# Patient Record
Sex: Female | Born: 1935 | Race: White | Hispanic: No | Smoking: Former smoker
Health system: Southern US, Community
[De-identification: ages and names within clinical notes are randomized; demographics above are authoritative.]

## PROBLEM LIST (undated history)

## (undated) DIAGNOSIS — J42 Unspecified chronic bronchitis: Secondary | ICD-10-CM

## (undated) DIAGNOSIS — J449 Chronic obstructive pulmonary disease, unspecified: Secondary | ICD-10-CM

## (undated) DIAGNOSIS — R112 Nausea with vomiting, unspecified: Secondary | ICD-10-CM

## (undated) DIAGNOSIS — M1990 Unspecified osteoarthritis, unspecified site: Secondary | ICD-10-CM

## (undated) DIAGNOSIS — Z9889 Other specified postprocedural states: Secondary | ICD-10-CM

## (undated) DIAGNOSIS — T4145XA Adverse effect of unspecified anesthetic, initial encounter: Secondary | ICD-10-CM

## (undated) DIAGNOSIS — K219 Gastro-esophageal reflux disease without esophagitis: Secondary | ICD-10-CM

## (undated) DIAGNOSIS — E079 Disorder of thyroid, unspecified: Secondary | ICD-10-CM

## (undated) DIAGNOSIS — E039 Hypothyroidism, unspecified: Secondary | ICD-10-CM

## (undated) DIAGNOSIS — T8859XA Other complications of anesthesia, initial encounter: Secondary | ICD-10-CM

## (undated) DIAGNOSIS — N183 Chronic kidney disease, stage 3 (moderate): Secondary | ICD-10-CM

## (undated) DIAGNOSIS — C20 Malignant neoplasm of rectum: Secondary | ICD-10-CM

## (undated) DIAGNOSIS — H269 Unspecified cataract: Secondary | ICD-10-CM

## (undated) DIAGNOSIS — R011 Cardiac murmur, unspecified: Secondary | ICD-10-CM

## (undated) DIAGNOSIS — E785 Hyperlipidemia, unspecified: Secondary | ICD-10-CM

## (undated) DIAGNOSIS — I10 Essential (primary) hypertension: Secondary | ICD-10-CM

## (undated) HISTORY — PX: ABDOMINAL HYSTERECTOMY: SHX81

## (undated) HISTORY — DX: Malignant neoplasm of rectum: C20

## (undated) HISTORY — DX: Essential (primary) hypertension: I10

## (undated) HISTORY — DX: Unspecified osteoarthritis, unspecified site: M19.90

## (undated) HISTORY — DX: Gastro-esophageal reflux disease without esophagitis: K21.9

## (undated) HISTORY — DX: Unspecified cataract: H26.9

## (undated) HISTORY — DX: Hyperlipidemia, unspecified: E78.5

## (undated) HISTORY — DX: Chronic kidney disease, stage 3 (moderate): N18.3

## (undated) HISTORY — PX: OTHER SURGICAL HISTORY: SHX169

## (undated) HISTORY — DX: Cardiac murmur, unspecified: R01.1

## (undated) HISTORY — DX: Disorder of thyroid, unspecified: E07.9

## (undated) HISTORY — DX: Unspecified chronic bronchitis: J42

---

## 2000-06-25 ENCOUNTER — Emergency Department (HOSPITAL_COMMUNITY): Admission: EM | Admit: 2000-06-25 | Discharge: 2000-06-25 | Payer: Self-pay | Admitting: Emergency Medicine

## 2000-06-25 ENCOUNTER — Encounter: Payer: Self-pay | Admitting: Emergency Medicine

## 2009-02-17 ENCOUNTER — Ambulatory Visit (HOSPITAL_COMMUNITY): Admission: RE | Admit: 2009-02-17 | Discharge: 2009-02-17 | Payer: Self-pay | Admitting: Orthopedic Surgery

## 2011-11-01 DIAGNOSIS — J441 Chronic obstructive pulmonary disease with (acute) exacerbation: Secondary | ICD-10-CM | POA: Diagnosis not present

## 2011-11-01 DIAGNOSIS — Z9981 Dependence on supplemental oxygen: Secondary | ICD-10-CM | POA: Diagnosis not present

## 2011-11-01 DIAGNOSIS — J111 Influenza due to unidentified influenza virus with other respiratory manifestations: Secondary | ICD-10-CM | POA: Diagnosis not present

## 2011-11-01 DIAGNOSIS — I10 Essential (primary) hypertension: Secondary | ICD-10-CM | POA: Diagnosis not present

## 2011-11-01 DIAGNOSIS — F17200 Nicotine dependence, unspecified, uncomplicated: Secondary | ICD-10-CM | POA: Diagnosis not present

## 2011-11-06 DIAGNOSIS — Z9981 Dependence on supplemental oxygen: Secondary | ICD-10-CM | POA: Diagnosis not present

## 2011-11-06 DIAGNOSIS — I10 Essential (primary) hypertension: Secondary | ICD-10-CM | POA: Diagnosis not present

## 2011-11-06 DIAGNOSIS — J441 Chronic obstructive pulmonary disease with (acute) exacerbation: Secondary | ICD-10-CM | POA: Diagnosis not present

## 2011-11-06 DIAGNOSIS — J111 Influenza due to unidentified influenza virus with other respiratory manifestations: Secondary | ICD-10-CM | POA: Diagnosis not present

## 2011-11-06 DIAGNOSIS — F17200 Nicotine dependence, unspecified, uncomplicated: Secondary | ICD-10-CM | POA: Diagnosis not present

## 2011-11-08 DIAGNOSIS — F17200 Nicotine dependence, unspecified, uncomplicated: Secondary | ICD-10-CM | POA: Diagnosis not present

## 2011-11-08 DIAGNOSIS — J111 Influenza due to unidentified influenza virus with other respiratory manifestations: Secondary | ICD-10-CM | POA: Diagnosis not present

## 2011-11-08 DIAGNOSIS — I10 Essential (primary) hypertension: Secondary | ICD-10-CM | POA: Diagnosis not present

## 2011-11-08 DIAGNOSIS — Z9981 Dependence on supplemental oxygen: Secondary | ICD-10-CM | POA: Diagnosis not present

## 2011-11-08 DIAGNOSIS — J441 Chronic obstructive pulmonary disease with (acute) exacerbation: Secondary | ICD-10-CM | POA: Diagnosis not present

## 2011-11-14 DIAGNOSIS — J441 Chronic obstructive pulmonary disease with (acute) exacerbation: Secondary | ICD-10-CM | POA: Diagnosis not present

## 2011-11-14 DIAGNOSIS — F17200 Nicotine dependence, unspecified, uncomplicated: Secondary | ICD-10-CM | POA: Diagnosis not present

## 2011-11-14 DIAGNOSIS — J111 Influenza due to unidentified influenza virus with other respiratory manifestations: Secondary | ICD-10-CM | POA: Diagnosis not present

## 2011-11-14 DIAGNOSIS — Z9981 Dependence on supplemental oxygen: Secondary | ICD-10-CM | POA: Diagnosis not present

## 2011-11-14 DIAGNOSIS — I10 Essential (primary) hypertension: Secondary | ICD-10-CM | POA: Diagnosis not present

## 2011-11-18 DIAGNOSIS — F17200 Nicotine dependence, unspecified, uncomplicated: Secondary | ICD-10-CM | POA: Diagnosis not present

## 2011-11-18 DIAGNOSIS — I10 Essential (primary) hypertension: Secondary | ICD-10-CM | POA: Diagnosis not present

## 2011-11-18 DIAGNOSIS — Z9981 Dependence on supplemental oxygen: Secondary | ICD-10-CM | POA: Diagnosis not present

## 2011-11-18 DIAGNOSIS — J441 Chronic obstructive pulmonary disease with (acute) exacerbation: Secondary | ICD-10-CM | POA: Diagnosis not present

## 2011-11-18 DIAGNOSIS — J111 Influenza due to unidentified influenza virus with other respiratory manifestations: Secondary | ICD-10-CM | POA: Diagnosis not present

## 2011-11-21 DIAGNOSIS — J4489 Other specified chronic obstructive pulmonary disease: Secondary | ICD-10-CM | POA: Diagnosis not present

## 2011-11-21 DIAGNOSIS — E0590 Thyrotoxicosis, unspecified without thyrotoxic crisis or storm: Secondary | ICD-10-CM | POA: Diagnosis not present

## 2011-11-21 DIAGNOSIS — E039 Hypothyroidism, unspecified: Secondary | ICD-10-CM | POA: Diagnosis not present

## 2011-11-21 DIAGNOSIS — J449 Chronic obstructive pulmonary disease, unspecified: Secondary | ICD-10-CM | POA: Diagnosis not present

## 2011-11-21 DIAGNOSIS — I10 Essential (primary) hypertension: Secondary | ICD-10-CM | POA: Diagnosis not present

## 2011-11-28 DIAGNOSIS — R51 Headache: Secondary | ICD-10-CM | POA: Diagnosis not present

## 2011-11-28 DIAGNOSIS — I10 Essential (primary) hypertension: Secondary | ICD-10-CM | POA: Diagnosis not present

## 2011-12-04 DIAGNOSIS — J441 Chronic obstructive pulmonary disease with (acute) exacerbation: Secondary | ICD-10-CM | POA: Diagnosis not present

## 2011-12-04 DIAGNOSIS — I10 Essential (primary) hypertension: Secondary | ICD-10-CM | POA: Diagnosis not present

## 2011-12-04 DIAGNOSIS — Z9981 Dependence on supplemental oxygen: Secondary | ICD-10-CM | POA: Diagnosis not present

## 2011-12-04 DIAGNOSIS — J111 Influenza due to unidentified influenza virus with other respiratory manifestations: Secondary | ICD-10-CM | POA: Diagnosis not present

## 2011-12-04 DIAGNOSIS — F17200 Nicotine dependence, unspecified, uncomplicated: Secondary | ICD-10-CM | POA: Diagnosis not present

## 2011-12-25 DIAGNOSIS — I10 Essential (primary) hypertension: Secondary | ICD-10-CM | POA: Diagnosis not present

## 2011-12-25 DIAGNOSIS — J111 Influenza due to unidentified influenza virus with other respiratory manifestations: Secondary | ICD-10-CM | POA: Diagnosis not present

## 2011-12-25 DIAGNOSIS — Z9981 Dependence on supplemental oxygen: Secondary | ICD-10-CM | POA: Diagnosis not present

## 2011-12-25 DIAGNOSIS — F17200 Nicotine dependence, unspecified, uncomplicated: Secondary | ICD-10-CM | POA: Diagnosis not present

## 2011-12-25 DIAGNOSIS — J441 Chronic obstructive pulmonary disease with (acute) exacerbation: Secondary | ICD-10-CM | POA: Diagnosis not present

## 2012-02-13 DIAGNOSIS — J449 Chronic obstructive pulmonary disease, unspecified: Secondary | ICD-10-CM | POA: Diagnosis not present

## 2012-02-13 DIAGNOSIS — Z87891 Personal history of nicotine dependence: Secondary | ICD-10-CM | POA: Diagnosis not present

## 2012-02-13 DIAGNOSIS — R0902 Hypoxemia: Secondary | ICD-10-CM | POA: Diagnosis not present

## 2012-03-06 DIAGNOSIS — J441 Chronic obstructive pulmonary disease with (acute) exacerbation: Secondary | ICD-10-CM | POA: Diagnosis not present

## 2012-03-06 DIAGNOSIS — Z87891 Personal history of nicotine dependence: Secondary | ICD-10-CM | POA: Diagnosis not present

## 2012-03-12 DIAGNOSIS — J449 Chronic obstructive pulmonary disease, unspecified: Secondary | ICD-10-CM | POA: Diagnosis not present

## 2012-04-09 DIAGNOSIS — Z87891 Personal history of nicotine dependence: Secondary | ICD-10-CM | POA: Diagnosis not present

## 2012-04-09 DIAGNOSIS — J441 Chronic obstructive pulmonary disease with (acute) exacerbation: Secondary | ICD-10-CM | POA: Diagnosis not present

## 2012-04-28 DIAGNOSIS — H109 Unspecified conjunctivitis: Secondary | ICD-10-CM | POA: Diagnosis not present

## 2012-05-04 DIAGNOSIS — H04129 Dry eye syndrome of unspecified lacrimal gland: Secondary | ICD-10-CM | POA: Diagnosis not present

## 2012-07-07 DIAGNOSIS — J449 Chronic obstructive pulmonary disease, unspecified: Secondary | ICD-10-CM | POA: Diagnosis not present

## 2012-07-07 DIAGNOSIS — Z87891 Personal history of nicotine dependence: Secondary | ICD-10-CM | POA: Diagnosis not present

## 2012-07-07 DIAGNOSIS — R0902 Hypoxemia: Secondary | ICD-10-CM | POA: Diagnosis not present

## 2012-08-17 DIAGNOSIS — R0902 Hypoxemia: Secondary | ICD-10-CM | POA: Diagnosis not present

## 2012-08-17 DIAGNOSIS — Z87891 Personal history of nicotine dependence: Secondary | ICD-10-CM | POA: Diagnosis not present

## 2012-08-17 DIAGNOSIS — J441 Chronic obstructive pulmonary disease with (acute) exacerbation: Secondary | ICD-10-CM | POA: Diagnosis not present

## 2012-09-16 DIAGNOSIS — M2550 Pain in unspecified joint: Secondary | ICD-10-CM | POA: Diagnosis not present

## 2012-09-16 DIAGNOSIS — K219 Gastro-esophageal reflux disease without esophagitis: Secondary | ICD-10-CM | POA: Diagnosis not present

## 2012-09-16 DIAGNOSIS — M79609 Pain in unspecified limb: Secondary | ICD-10-CM | POA: Diagnosis not present

## 2012-09-16 DIAGNOSIS — Z23 Encounter for immunization: Secondary | ICD-10-CM | POA: Diagnosis not present

## 2012-09-16 DIAGNOSIS — E559 Vitamin D deficiency, unspecified: Secondary | ICD-10-CM | POA: Diagnosis not present

## 2012-09-16 DIAGNOSIS — I10 Essential (primary) hypertension: Secondary | ICD-10-CM | POA: Diagnosis not present

## 2012-09-16 DIAGNOSIS — E039 Hypothyroidism, unspecified: Secondary | ICD-10-CM | POA: Diagnosis not present

## 2012-09-29 DIAGNOSIS — Z87891 Personal history of nicotine dependence: Secondary | ICD-10-CM | POA: Diagnosis not present

## 2012-09-29 DIAGNOSIS — J441 Chronic obstructive pulmonary disease with (acute) exacerbation: Secondary | ICD-10-CM | POA: Diagnosis not present

## 2012-09-29 DIAGNOSIS — R0902 Hypoxemia: Secondary | ICD-10-CM | POA: Diagnosis not present

## 2012-10-16 DIAGNOSIS — J209 Acute bronchitis, unspecified: Secondary | ICD-10-CM | POA: Diagnosis not present

## 2012-10-27 DIAGNOSIS — J449 Chronic obstructive pulmonary disease, unspecified: Secondary | ICD-10-CM | POA: Diagnosis not present

## 2012-10-27 DIAGNOSIS — Z87891 Personal history of nicotine dependence: Secondary | ICD-10-CM | POA: Diagnosis not present

## 2012-12-18 DIAGNOSIS — I10 Essential (primary) hypertension: Secondary | ICD-10-CM | POA: Diagnosis not present

## 2012-12-18 DIAGNOSIS — E785 Hyperlipidemia, unspecified: Secondary | ICD-10-CM | POA: Diagnosis not present

## 2012-12-18 DIAGNOSIS — E039 Hypothyroidism, unspecified: Secondary | ICD-10-CM | POA: Diagnosis not present

## 2013-01-27 DIAGNOSIS — Z87891 Personal history of nicotine dependence: Secondary | ICD-10-CM | POA: Diagnosis not present

## 2013-01-27 DIAGNOSIS — J441 Chronic obstructive pulmonary disease with (acute) exacerbation: Secondary | ICD-10-CM | POA: Diagnosis not present

## 2013-01-27 DIAGNOSIS — J449 Chronic obstructive pulmonary disease, unspecified: Secondary | ICD-10-CM | POA: Diagnosis not present

## 2013-03-10 ENCOUNTER — Other Ambulatory Visit: Payer: Self-pay | Admitting: *Deleted

## 2013-03-10 MED ORDER — TIOTROPIUM BROMIDE MONOHYDRATE 18 MCG IN CAPS: 18.0000 ug | ORAL_CAPSULE | Freq: Every day | RESPIRATORY_TRACT | Status: DC

## 2013-04-27 DIAGNOSIS — J449 Chronic obstructive pulmonary disease, unspecified: Secondary | ICD-10-CM | POA: Diagnosis not present

## 2013-04-28 ENCOUNTER — Other Ambulatory Visit: Payer: Self-pay | Admitting: *Deleted

## 2013-04-28 MED ORDER — LEVOTHYROXINE SODIUM 112 MCG PO TABS: 112.0000 ug | ORAL_TABLET | Freq: Every day | ORAL | Status: DC

## 2013-05-11 ENCOUNTER — Other Ambulatory Visit: Payer: Self-pay

## 2013-05-11 MED ORDER — OMEPRAZOLE 40 MG PO CPDR: 40.0000 mg | DELAYED_RELEASE_CAPSULE | Freq: Every day | ORAL | Status: DC

## 2013-05-20 ENCOUNTER — Other Ambulatory Visit: Payer: Self-pay

## 2013-05-20 MED ORDER — LISINOPRIL-HYDROCHLOROTHIAZIDE 20-12.5 MG PO TABS: 1.0000 | ORAL_TABLET | Freq: Two times a day (BID) | ORAL | Status: DC

## 2013-06-25 ENCOUNTER — Other Ambulatory Visit: Payer: Self-pay

## 2013-06-25 MED ORDER — LISINOPRIL-HYDROCHLOROTHIAZIDE 20-12.5 MG PO TABS: 1.0000 | ORAL_TABLET | Freq: Two times a day (BID) | ORAL | Status: DC

## 2013-06-25 NOTE — Telephone Encounter (Signed)
Last seen 12/18/12  [facility] 

## 2013-07-13 ENCOUNTER — Other Ambulatory Visit: Payer: Self-pay

## 2013-07-13 NOTE — Telephone Encounter (Signed)
Last seen 12/18/12  [facility]

## 2013-07-14 MED ORDER — TIOTROPIUM BROMIDE MONOHYDRATE 18 MCG IN CAPS: 18.0000 ug | ORAL_CAPSULE | Freq: Every day | RESPIRATORY_TRACT | Status: DC

## 2013-07-14 MED ORDER — OMEPRAZOLE 40 MG PO CPDR: 40.0000 mg | DELAYED_RELEASE_CAPSULE | Freq: Every day | ORAL | Status: DC

## 2013-07-14 MED ORDER — FLUTICASONE PROPIONATE 50 MCG/ACT NA SUSP: 2.0000 | Freq: Every day | NASAL | Status: DC

## 2013-07-20 ENCOUNTER — Other Ambulatory Visit: Payer: Self-pay

## 2013-07-20 MED ORDER — ATORVASTATIN CALCIUM 20 MG PO TABS: 20.0000 mg | ORAL_TABLET | Freq: Every day | ORAL | Status: DC

## 2013-07-20 NOTE — Telephone Encounter (Signed)
Last lipids 12/18/12  Last seen 12/18/12  [facility]

## 2013-07-27 ENCOUNTER — Other Ambulatory Visit: Payer: Self-pay

## 2013-07-27 NOTE — Telephone Encounter (Signed)
Last seen 12/18/12  [facility] 

## 2013-07-28 NOTE — Telephone Encounter (Signed)
Patient needs to be seen. Has exceeded time since last visit. Needs to bring all medications to next appointment.   

## 2013-07-29 DIAGNOSIS — J449 Chronic obstructive pulmonary disease, unspecified: Secondary | ICD-10-CM | POA: Diagnosis not present

## 2013-08-17 DIAGNOSIS — J449 Chronic obstructive pulmonary disease, unspecified: Secondary | ICD-10-CM | POA: Diagnosis not present

## 2013-08-17 DIAGNOSIS — I10 Essential (primary) hypertension: Secondary | ICD-10-CM | POA: Diagnosis not present

## 2013-08-17 DIAGNOSIS — R011 Cardiac murmur, unspecified: Secondary | ICD-10-CM | POA: Diagnosis not present

## 2013-08-19 ENCOUNTER — Other Ambulatory Visit: Payer: Self-pay

## 2013-08-19 NOTE — Telephone Encounter (Signed)
[facility] store notified

## 2013-08-19 NOTE — Telephone Encounter (Signed)
Patient needs to be seen. Has exceeded time since last visit. Needs to bring all medications to next appointment.   

## 2013-08-19 NOTE — Telephone Encounter (Signed)
Last seen 12/18/12  [facility]

## 2013-08-19 NOTE — Telephone Encounter (Signed)
Last seen 12/18/12 [facility]  Last lipid 12/18/12

## 2013-08-27 ENCOUNTER — Ambulatory Visit (INDEPENDENT_AMBULATORY_CARE_PROVIDER_SITE_OTHER): Payer: Medicare Other | Admitting: General Practice

## 2013-08-27 ENCOUNTER — Encounter (INDEPENDENT_AMBULATORY_CARE_PROVIDER_SITE_OTHER): Payer: Self-pay

## 2013-08-27 ENCOUNTER — Encounter: Payer: Self-pay | Admitting: General Practice

## 2013-08-27 VITALS — BP 140/83 | HR 82 | Temp 98.1°F | Ht 64.0 in | Wt 135.5 lb

## 2013-08-27 DIAGNOSIS — E039 Hypothyroidism, unspecified: Secondary | ICD-10-CM | POA: Diagnosis not present

## 2013-08-27 DIAGNOSIS — I10 Essential (primary) hypertension: Secondary | ICD-10-CM

## 2013-08-27 DIAGNOSIS — E785 Hyperlipidemia, unspecified: Secondary | ICD-10-CM

## 2013-08-27 DIAGNOSIS — J449 Chronic obstructive pulmonary disease, unspecified: Secondary | ICD-10-CM

## 2013-08-27 DIAGNOSIS — K219 Gastro-esophageal reflux disease without esophagitis: Secondary | ICD-10-CM

## 2013-08-27 MED ORDER — ATORVASTATIN CALCIUM 20 MG PO TABS: 20.0000 mg | ORAL_TABLET | Freq: Every day | ORAL | Status: DC

## 2013-08-27 MED ORDER — AMLODIPINE BESYLATE 5 MG PO TABS: 5.0000 mg | ORAL_TABLET | Freq: Every day | ORAL | Status: DC

## 2013-08-27 MED ORDER — TIOTROPIUM BROMIDE MONOHYDRATE 18 MCG IN CAPS: 18.0000 ug | ORAL_CAPSULE | Freq: Every day | RESPIRATORY_TRACT | Status: DC

## 2013-08-27 MED ORDER — OMEPRAZOLE 40 MG PO CPDR: 40.0000 mg | DELAYED_RELEASE_CAPSULE | Freq: Every day | ORAL | Status: DC

## 2013-08-27 MED ORDER — LEVOTHYROXINE SODIUM 112 MCG PO TABS: 112.0000 ug | ORAL_TABLET | Freq: Every day | ORAL | Status: DC

## 2013-08-27 MED ORDER — LISINOPRIL-HYDROCHLOROTHIAZIDE 20-12.5 MG PO TABS: 1.0000 | ORAL_TABLET | Freq: Two times a day (BID) | ORAL | Status: DC

## 2013-08-27 NOTE — Progress Notes (Signed)
  Subjective:    Patient ID: Meghan Anderson, female    DOB: [birth year], 77 y.o.   MRN: [id number]  HPI Patient presents today for follow up of chronic health conditions. She has a history of hypertension, hyperlipidemia, gerd, copd, and hypothyroidism. She reports taking medications as directed. Reports daily exercise (walking) and eating healthy. Denies any problems or concerns.     Review of Systems  Constitutional: Negative for fever and chills.  Respiratory: Negative for cough, chest tightness, shortness of breath and wheezing.   Cardiovascular: Negative for chest pain and palpitations.  Gastrointestinal: Negative for abdominal pain, diarrhea, constipation and blood in stool.  Genitourinary: Negative for dysuria, hematuria and difficulty urinating.  Musculoskeletal: Negative for back pain, neck pain and neck stiffness.  Neurological: Negative for dizziness, weakness and headaches.       Objective:   Physical Exam  Constitutional: She is oriented to person, place, and time. She appears well-developed and well-nourished.  HENT:  Head: Normocephalic and atraumatic.  Right Ear: External ear normal.  Left Ear: External ear normal.  Nose: Nose normal.  Mouth/Throat: Oropharynx is clear and moist.  Eyes: EOM are normal. Pupils are equal, round, and reactive to light.  Neck: Normal range of motion. Neck supple. No thyromegaly present.  Cardiovascular: Normal rate, regular rhythm and normal heart sounds.   Pulmonary/Chest: Effort normal and breath sounds normal. No respiratory distress. She exhibits no tenderness.  Abdominal: Soft. Bowel sounds are normal. She exhibits no distension. There is no tenderness.  Musculoskeletal: She exhibits no edema and no tenderness.  Lymphadenopathy:    She has no cervical adenopathy.  Neurological: She is alert and oriented to person, place, and time.  Skin: Skin is warm and dry.  Psychiatric: She has a normal mood and affect.           Assessment & Plan:  1. Hyperlipidemia  - NMR, lipoprofile - atorvastatin (LIPITOR) 20 MG tablet; Take 1 tablet (20 mg total) by mouth daily.  Dispense: 30 tablet; Refill: 3  2. Hypothyroid  - Thyroid Panel With TSH - levothyroxine (SYNTHROID, LEVOTHROID) 112 MCG tablet; Take 1 tablet (112 mcg total) by mouth daily.  Dispense: 30 tablet; Refill: 5  3. Hypertension  - CMP14+EGFR - amLODipine (NORVASC) 5 MG tablet; Take 1 tablet (5 mg total) by mouth daily.  Dispense: 30 tablet; Refill: 3 - lisinopril-hydrochlorothiazide (ZESTORETIC) 20-12.5 MG per tablet; Take 1 tablet by mouth 2 (two) times daily.  Dispense: 60 tablet; Refill: 3  4. COPD (chronic obstructive pulmonary disease)  - tiotropium (SPIRIVA HANDIHALER) 18 MCG inhalation capsule; Place 1 capsule (18 mcg total) into inhaler and inhale daily.  Dispense: 30 capsule; Refill: 6  5. GERD (gastroesophageal reflux disease)  - omeprazole (PRILOSEC) 40 MG capsule; Take 1 capsule (40 mg total) by mouth daily.  Dispense: 30 capsule; Refill: 3 -Continue all current medications Labs pending F/u in 3 months Discussed exercise and diet  Patient verbalized understanding Coralie Keens, FNP-C

## 2013-08-30 LAB — NMR, LIPOPROFILE
Cholesterol: 139 mg/dL (ref <200)
HDL Cholesterol by NMR: 41 mg/dL (ref >=40)
HDL Particle Number: 33.4 umol/L (ref >=30.5)
LDLC SERPL CALC-MCNC: 63 mg/dL (ref <100)
Triglycerides by NMR: 175 mg/dL — ABNORMAL HIGH (ref <150)

## 2013-08-30 LAB — CMP14+EGFR
AST: 12 IU/L (ref 0–40)
Albumin/Globulin Ratio: 1.6 (ref 1.1–2.5)
Alkaline Phosphatase: 77 IU/L (ref 39–117)
BUN/Creatinine Ratio: 17 (ref 11–26)
CO2: 26 mmol/L (ref 18–29)
Creatinine, Ser: 0.99 mg/dL (ref 0.57–1.00)
Globulin, Total: 2.6 g/dL (ref 1.5–4.5)
Sodium: 141 mmol/L (ref 134–144)
Total Bilirubin: 0.3 mg/dL (ref 0.0–1.2)

## 2013-09-02 ENCOUNTER — Encounter: Payer: Self-pay | Admitting: *Deleted

## 2013-10-06 DIAGNOSIS — J441 Chronic obstructive pulmonary disease with (acute) exacerbation: Secondary | ICD-10-CM | POA: Diagnosis not present

## 2013-10-06 DIAGNOSIS — R079 Chest pain, unspecified: Secondary | ICD-10-CM | POA: Diagnosis not present

## 2013-10-06 DIAGNOSIS — J449 Chronic obstructive pulmonary disease, unspecified: Secondary | ICD-10-CM | POA: Diagnosis not present

## 2013-10-25 ENCOUNTER — Other Ambulatory Visit: Payer: Self-pay

## 2013-10-25 DIAGNOSIS — K219 Gastro-esophageal reflux disease without esophagitis: Secondary | ICD-10-CM

## 2013-10-25 MED ORDER — OMEPRAZOLE 40 MG PO CPDR: 40.0000 mg | DELAYED_RELEASE_CAPSULE | Freq: Every day | ORAL | Status: DC

## 2013-10-29 DIAGNOSIS — J449 Chronic obstructive pulmonary disease, unspecified: Secondary | ICD-10-CM | POA: Diagnosis not present

## 2013-12-24 ENCOUNTER — Other Ambulatory Visit: Payer: Self-pay | Admitting: *Deleted

## 2013-12-24 DIAGNOSIS — E785 Hyperlipidemia, unspecified: Secondary | ICD-10-CM

## 2013-12-24 DIAGNOSIS — I10 Essential (primary) hypertension: Secondary | ICD-10-CM

## 2013-12-24 MED ORDER — ATORVASTATIN CALCIUM 20 MG PO TABS: 20.0000 mg | ORAL_TABLET | Freq: Every day | ORAL | Status: DC

## 2013-12-24 MED ORDER — LISINOPRIL-HYDROCHLOROTHIAZIDE 20-12.5 MG PO TABS: 1.0000 | ORAL_TABLET | Freq: Two times a day (BID) | ORAL | Status: DC

## 2014-01-27 DIAGNOSIS — R942 Abnormal results of pulmonary function studies: Secondary | ICD-10-CM | POA: Diagnosis not present

## 2014-01-27 DIAGNOSIS — R059 Cough, unspecified: Secondary | ICD-10-CM | POA: Diagnosis not present

## 2014-01-27 DIAGNOSIS — R05 Cough: Secondary | ICD-10-CM | POA: Diagnosis not present

## 2014-01-27 DIAGNOSIS — J449 Chronic obstructive pulmonary disease, unspecified: Secondary | ICD-10-CM | POA: Diagnosis not present

## 2014-01-28 ENCOUNTER — Other Ambulatory Visit: Payer: Self-pay | Admitting: General Practice

## 2014-03-04 ENCOUNTER — Other Ambulatory Visit: Payer: Self-pay | Admitting: General Practice

## 2014-03-07 NOTE — Telephone Encounter (Signed)
Last seen 08/27/13 Meghan Anderson  Last lipid 10/14

## 2014-03-23 ENCOUNTER — Ambulatory Visit (INDEPENDENT_AMBULATORY_CARE_PROVIDER_SITE_OTHER): Payer: Medicare Other | Admitting: Family Medicine

## 2014-03-23 ENCOUNTER — Encounter: Payer: Self-pay | Admitting: Family Medicine

## 2014-03-23 VITALS — BP 178/81 | HR 78 | Temp 98.2°F | Wt 138.6 lb

## 2014-03-23 DIAGNOSIS — E039 Hypothyroidism, unspecified: Secondary | ICD-10-CM | POA: Diagnosis not present

## 2014-03-23 DIAGNOSIS — E785 Hyperlipidemia, unspecified: Secondary | ICD-10-CM

## 2014-03-23 DIAGNOSIS — J449 Chronic obstructive pulmonary disease, unspecified: Secondary | ICD-10-CM

## 2014-03-23 DIAGNOSIS — J209 Acute bronchitis, unspecified: Secondary | ICD-10-CM

## 2014-03-23 DIAGNOSIS — I10 Essential (primary) hypertension: Secondary | ICD-10-CM

## 2014-03-23 DIAGNOSIS — K219 Gastro-esophageal reflux disease without esophagitis: Secondary | ICD-10-CM

## 2014-03-23 MED ORDER — HYDROCODONE-HOMATROPINE 5-1.5 MG/5ML PO SYRP: 5.0000 mL | ORAL_SOLUTION | Freq: Three times a day (TID) | ORAL | Status: DC | PRN

## 2014-03-23 MED ORDER — OMEPRAZOLE 40 MG PO CPDR: DELAYED_RELEASE_CAPSULE | ORAL | Status: DC

## 2014-03-23 MED ORDER — TIOTROPIUM BROMIDE MONOHYDRATE 18 MCG IN CAPS: 18.0000 ug | ORAL_CAPSULE | Freq: Every day | RESPIRATORY_TRACT | Status: DC

## 2014-03-23 MED ORDER — ATORVASTATIN CALCIUM 20 MG PO TABS: 20.0000 mg | ORAL_TABLET | Freq: Every day | ORAL | Status: DC

## 2014-03-23 MED ORDER — METHYLPREDNISOLONE ACETATE 80 MG/ML IJ SUSP: 80.0000 mg | Freq: Once | INTRAMUSCULAR | Status: DC

## 2014-03-23 MED ORDER — LEVOTHYROXINE SODIUM 112 MCG PO TABS: 112.0000 ug | ORAL_TABLET | Freq: Every day | ORAL | Status: DC

## 2014-03-23 MED ORDER — LISINOPRIL-HYDROCHLOROTHIAZIDE 20-12.5 MG PO TABS: ORAL_TABLET | ORAL | Status: DC

## 2014-03-23 MED ORDER — AMOXICILLIN 875 MG PO TABS: 875.0000 mg | ORAL_TABLET | Freq: Two times a day (BID) | ORAL | Status: DC

## 2014-03-23 NOTE — Progress Notes (Signed)
   Subjective:    Patient ID: Meghan Anderson, female    DOB: December 28, 1935, 78 y.o.   MRN: [id number]  HPI This 78 y.o. female presents for evaluation of cough and bronchitis sx's.  She is having difficulty Sleeping at night due to coughing.  She has hx of hypertension, hyperlipidemia, and SAR.  She is needing refills on her meds.   Review of Systems C/o uri sx's   No chest pain, SOB, HA, dizziness, vision change, N/V, diarrhea, constipation, dysuria, urinary urgency or frequency, myalgias, arthralgias or rash.  Objective:   Physical Exam Vital signs noted  Well developed well nourished female.  HEENT - Head atraumatic Normocephalic                Eyes - PERRLA, Conjuctiva - clear Sclera- Clear EOMI                Ears - EAC's Wnl TM's Wnl Gross Hearing WNL                Nose - Nares patent                 Throat - oropharanx wnl Respiratory - Lungs CTA bilateral Cardiac - RRR S1 and S2 without murmur GI - Abdomen soft Nontender and bowel sounds active x 4 Extremities - No edema. Neuro - Grossly intact.       Assessment & Plan:  Hypothyroid - Plan: levothyroxine (SYNTHROID, LEVOTHROID) 112 MCG tablet, amoxicillin (AMOXIL) 875 MG tablet, HYDROcodone-homatropine (HYCODAN) 5-1.5 MG/5ML syrup, DISCONTINUED: methylPREDNISolone acetate (DEPO-MEDROL) injection 80 mg  COPD (chronic obstructive pulmonary disease) - Plan: tiotropium (SPIRIVA HANDIHALER) 18 MCG inhalation capsule  Acute bronchitis - Amoxicillin 875mg  po bid x 10 days, hycodan cough syrup, depomedrol 80mg  IM Push po fluids, rest, tylenol and motrin otc prn as directed for fever, arthralgias, and myalgias.  Follow up prn if sx's continue or persist.  Essential hypertension, benign - Plan: lisinopril-hydrochlorothiazide (PRINZIDE,ZESTORETIC) 20-12.5 MG per tablet  Unspecified hypothyroidism - Plan: levothyroxine (SYNTHROID, LEVOTHROID) 112 MCG tablet  GERD (gastroesophageal reflux disease) - Plan: omeprazole (PRILOSEC)  40 MG capsule  Other and unspecified hyperlipidemia - Plan: atorvastatin (LIPITOR) 20 MG tablet  Lysbeth Penner FNP

## 2014-04-12 DIAGNOSIS — J209 Acute bronchitis, unspecified: Secondary | ICD-10-CM | POA: Diagnosis not present

## 2014-04-12 DIAGNOSIS — E039 Hypothyroidism, unspecified: Secondary | ICD-10-CM | POA: Diagnosis not present

## 2014-04-12 DIAGNOSIS — J449 Chronic obstructive pulmonary disease, unspecified: Secondary | ICD-10-CM | POA: Diagnosis not present

## 2014-04-12 DIAGNOSIS — I10 Essential (primary) hypertension: Secondary | ICD-10-CM | POA: Diagnosis not present

## 2014-04-12 MED ORDER — METHYLPREDNISOLONE ACETATE 80 MG/ML IJ SUSP
60.0000 mg | Freq: Once | INTRAMUSCULAR | Status: AC
  Administered 2014-04-12: 60 mg via INTRAMUSCULAR

## 2014-04-12 NOTE — Addendum Note (Signed)
Addended by: Marin Olp on: 04/12/2014 06:00 PM   Modules accepted: Orders

## 2014-05-09 DIAGNOSIS — R059 Cough, unspecified: Secondary | ICD-10-CM | POA: Diagnosis not present

## 2014-05-09 DIAGNOSIS — J449 Chronic obstructive pulmonary disease, unspecified: Secondary | ICD-10-CM | POA: Diagnosis not present

## 2014-05-09 DIAGNOSIS — R0609 Other forms of dyspnea: Secondary | ICD-10-CM | POA: Diagnosis not present

## 2014-05-09 DIAGNOSIS — R05 Cough: Secondary | ICD-10-CM | POA: Diagnosis not present

## 2014-05-09 DIAGNOSIS — R062 Wheezing: Secondary | ICD-10-CM | POA: Diagnosis not present

## 2014-05-09 DIAGNOSIS — R0989 Other specified symptoms and signs involving the circulatory and respiratory systems: Secondary | ICD-10-CM | POA: Diagnosis not present

## 2014-05-14 DIAGNOSIS — R079 Chest pain, unspecified: Secondary | ICD-10-CM | POA: Diagnosis not present

## 2014-05-14 DIAGNOSIS — R1011 Right upper quadrant pain: Secondary | ICD-10-CM | POA: Diagnosis not present

## 2014-05-14 DIAGNOSIS — Z87891 Personal history of nicotine dependence: Secondary | ICD-10-CM | POA: Diagnosis not present

## 2014-05-14 DIAGNOSIS — R0902 Hypoxemia: Secondary | ICD-10-CM | POA: Diagnosis not present

## 2014-05-14 DIAGNOSIS — R071 Chest pain on breathing: Secondary | ICD-10-CM | POA: Diagnosis not present

## 2014-05-14 DIAGNOSIS — I10 Essential (primary) hypertension: Secondary | ICD-10-CM | POA: Diagnosis not present

## 2014-05-14 DIAGNOSIS — R0602 Shortness of breath: Secondary | ICD-10-CM | POA: Diagnosis not present

## 2014-05-14 DIAGNOSIS — J441 Chronic obstructive pulmonary disease with (acute) exacerbation: Secondary | ICD-10-CM | POA: Diagnosis not present

## 2014-05-14 DIAGNOSIS — Z888 Allergy status to other drugs, medicaments and biological substances status: Secondary | ICD-10-CM | POA: Diagnosis not present

## 2014-05-14 DIAGNOSIS — Z79899 Other long term (current) drug therapy: Secondary | ICD-10-CM | POA: Diagnosis not present

## 2014-05-14 DIAGNOSIS — R110 Nausea: Secondary | ICD-10-CM | POA: Diagnosis not present

## 2014-05-14 DIAGNOSIS — R109 Unspecified abdominal pain: Secondary | ICD-10-CM | POA: Diagnosis not present

## 2014-05-19 ENCOUNTER — Other Ambulatory Visit: Payer: Self-pay | Admitting: Family Medicine

## 2014-07-02 ENCOUNTER — Other Ambulatory Visit: Payer: Self-pay | Admitting: Family Medicine

## 2014-07-29 ENCOUNTER — Encounter: Payer: Self-pay | Admitting: Pharmacist

## 2014-07-29 ENCOUNTER — Ambulatory Visit (INDEPENDENT_AMBULATORY_CARE_PROVIDER_SITE_OTHER): Payer: Medicare Other | Admitting: Pharmacist

## 2014-07-29 VITALS — BP 142/84 | HR 70 | Ht 64.0 in | Wt 132.0 lb

## 2014-07-29 DIAGNOSIS — E039 Hypothyroidism, unspecified: Secondary | ICD-10-CM | POA: Diagnosis not present

## 2014-07-29 DIAGNOSIS — M8580 Other specified disorders of bone density and structure, unspecified site: Secondary | ICD-10-CM

## 2014-07-29 DIAGNOSIS — J449 Chronic obstructive pulmonary disease, unspecified: Secondary | ICD-10-CM

## 2014-07-29 DIAGNOSIS — E785 Hyperlipidemia, unspecified: Secondary | ICD-10-CM | POA: Insufficient documentation

## 2014-07-29 DIAGNOSIS — M1990 Unspecified osteoarthritis, unspecified site: Secondary | ICD-10-CM

## 2014-07-29 DIAGNOSIS — K2100 Gastro-esophageal reflux disease with esophagitis, without bleeding: Secondary | ICD-10-CM

## 2014-07-29 DIAGNOSIS — R011 Cardiac murmur, unspecified: Secondary | ICD-10-CM

## 2014-07-29 DIAGNOSIS — I10 Essential (primary) hypertension: Secondary | ICD-10-CM | POA: Insufficient documentation

## 2014-07-29 DIAGNOSIS — Z0000 Encounter for general adult medical examination without abnormal findings: Secondary | ICD-10-CM

## 2014-07-29 MED ORDER — FLUTICASONE PROPIONATE 50 MCG/ACT NA SUSP: 2.0000 | Freq: Every day | NASAL | Status: DC | PRN

## 2014-07-29 NOTE — Patient Instructions (Signed)
Health Maintenance Summary    TETANUS/TDAP Overdue 02/23/1957   Cost covered by insurance    Dexa / Bone Density Overdue  Sent in referral today      ZOSTAVAX Overdue 02/23/1998  cost covered by insurance    PNEUMOCOCCAL POLYSACCHARIDE VACCINE AGE 76 AND OVER Postponed 08/11/2014 Will discuss with lung specialist    INFLUENZA VACCINE Postponed 08/15/2014 Will discuss with lung specialist      Preventive Care for Adults A healthy lifestyle and preventive care can promote health and wellness. Preventive health guidelines for women include the following key practices.  A routine yearly physical is a good way to check with your health care provider about your health and preventive screening. It is a chance to share any concerns and updates on your health and to receive a thorough exam.  Visit your dentist for a routine exam and preventive care every 6 months. Brush your teeth twice a day and floss once a day. Good oral hygiene prevents tooth decay and gum disease.  The frequency of eye exams is based on your age, health, family medical history, use of contact lenses, and other factors. Follow your health care provider's recommendations for frequency of eye exams.  Eat a healthy diet. Foods like vegetables, fruits, whole grains, low-fat dairy products, and lean protein foods contain the nutrients you need without too many calories. Decrease your intake of foods high in solid fats, added sugars, and salt. Eat the right amount of calories for you.Get information about a proper diet from your health care provider, if necessary.  Regular physical exercise is one of the most important things you can do for your health. Most adults should get at least 150 minutes of moderate-intensity exercise (any activity that increases your heart rate and causes you to sweat) each week. In addition, most adults need muscle-strengthening exercises on 2 or more days a week.  Maintain a healthy weight. The body mass index  (BMI) is a screening tool to identify possible weight problems. It provides an estimate of body fat based on height and weight. Your health care provider can find your BMI and can help you achieve or maintain a healthy weight.For adults 20 years and older:  A BMI below 18.5 is considered underweight.  A BMI of 18.5 to 24.9 is normal.  A BMI of 25 to 29.9 is considered overweight.  A BMI of 30 and above is considered obese.  Maintain normal blood lipids and cholesterol levels by exercising and minimizing your intake of saturated fat. Eat a balanced diet with plenty of fruit and vegetables. Blood tests for lipids and cholesterol should begin at age 1 and be repeated every 5 years. If your lipid or cholesterol levels are high, you are over 50, or you are at high risk for heart disease, you may need your cholesterol levels checked more frequently.Ongoing high lipid and cholesterol levels should be treated with medicines if diet and exercise are not working.  If you smoke, find out from your health care provider how to quit. If you do not use tobacco, do not start.  Lung cancer screening is recommended for adults aged 35-80 years who are at high risk for developing lung cancer because of a history of smoking. A yearly low-dose CT scan of the lungs is recommended for people who have at least a 30-pack-year history of smoking and are a current smoker or have quit within the past 15 years. A pack year of smoking is smoking an average of  1 pack of cigarettes a day for 1 year (for example: 1 pack a day for 30 years or 2 packs a day for 15 years). Yearly screening should continue until the smoker has stopped smoking for at least 15 years. Yearly screening should be stopped for people who develop a health problem that would prevent them from having lung cancer treatment.  If you are pregnant, do not drink alcohol. If you are breastfeeding, be very cautious about drinking alcohol. If you are not pregnant and  choose to drink alcohol, do not have more than 1 drink per day. One drink is considered to be 12 ounces (355 mL) of beer, 5 ounces (148 mL) of wine, or 1.5 ounces (44 mL) of liquor.  Avoid use of street drugs. Do not share needles with anyone. Ask for help if you need support or instructions about stopping the use of drugs.  High blood pressure causes heart disease and increases the risk of stroke. Your blood pressure should be checked at least every 1 to 2 years. Ongoing high blood pressure should be treated with medicines if weight loss and exercise do not work.  If you are 63-8 years old, ask your health care provider if you should take aspirin to prevent strokes.  Diabetes screening involves taking a blood sample to check your fasting blood sugar level. This should be done once every 3 years, after age 29, if you are within normal weight and without risk factors for diabetes. Testing should be considered at a younger age or be carried out more frequently if you are overweight and have at least 1 risk factor for diabetes.  Breast cancer screening is essential preventive care for women. You should practice "breast self-awareness." This means understanding the normal appearance and feel of your breasts and may include breast self-examination. Any changes detected, no matter how small, should be reported to a health care provider. Women in their 34s and 30s should have a clinical breast exam (CBE) by a health care provider as part of a regular health exam every 1 to 3 years. After age 39, women should have a CBE every year. Starting at age 65, women should consider having a mammogram (breast X-ray test) every year. Women who have a family history of breast cancer should talk to their health care provider about genetic screening. Women at a high risk of breast cancer should talk to their health care providers about having an MRI and a mammogram every year.  Breast cancer gene (BRCA)-related cancer risk  assessment is recommended for women who have family members with BRCA-related cancers. BRCA-related cancers include breast, ovarian, tubal, and peritoneal cancers. Having family members with these cancers may be associated with an increased risk for harmful changes (mutations) in the breast cancer genes BRCA1 and BRCA2. Results of the assessment will determine the need for genetic counseling and BRCA1 and BRCA2 testing.  Routine pelvic exams to screen for cancer are no longer recommended for nonpregnant women who are considered low risk for cancer of the pelvic organs (ovaries, uterus, and vagina) and who do not have symptoms. Ask your health care provider if a screening pelvic exam is right for you.  If you have had past treatment for cervical cancer or a condition that could lead to cancer, you need Pap tests and screening for cancer for at least 20 years after your treatment. If Pap tests have been discontinued, your risk factors (such as having a new sexual partner) need to be reassessed to  determine if screening should be resumed. Some women have medical problems that increase the chance of getting cervical cancer. In these cases, your health care provider may recommend more frequent screening and Pap tests.  The HPV test is an additional test that may be used for cervical cancer screening. The HPV test looks for the virus that can cause the cell changes on the cervix. The cells collected during the Pap test can be tested for HPV. The HPV test could be used to screen women aged 51 years and older, and should be used in women of any age who have unclear Pap test results. After the age of 74, women should have HPV testing at the same frequency as a Pap test.  Colorectal cancer can be detected and often prevented. Most routine colorectal cancer screening begins at the age of 14 years and continues through age 49 years. However, your health care provider may recommend screening at an earlier age if you have  risk factors for colon cancer. On a yearly basis, your health care provider may provide home test kits to check for hidden blood in the stool. Use of a small camera at the end of a tube, to directly examine the colon (sigmoidoscopy or colonoscopy), can detect the earliest forms of colorectal cancer. Talk to your health care provider about this at age 56, when routine screening begins. Direct exam of the colon should be repeated every 5-10 years through age 22 years, unless early forms of pre-cancerous polyps or small growths are found.  People who are at an increased risk for hepatitis B should be screened for this virus. You are considered at high risk for hepatitis B if:  You were born in a country where hepatitis B occurs often. Talk with your health care provider about which countries are considered high risk.  Your parents were born in a high-risk country and you have not received a shot to protect against hepatitis B (hepatitis B vaccine).  You have HIV or AIDS.  You use needles to inject street drugs.  You live with, or have sex with, someone who has hepatitis B.  You get hemodialysis treatment.  You take certain medicines for conditions like cancer, organ transplantation, and autoimmune conditions.  Hepatitis C blood testing is recommended for all people born from [birth year] through 1965 and any individual with known risks for hepatitis C.  Practice safe sex. Use condoms and avoid high-risk sexual practices to reduce the spread of sexually transmitted infections (STIs). STIs include gonorrhea, chlamydia, syphilis, trichomonas, herpes, HPV, and human immunodeficiency virus (HIV). Herpes, HIV, and HPV are viral illnesses that have no cure. They can result in disability, cancer, and death.  You should be screened for sexually transmitted illnesses (STIs) including gonorrhea and chlamydia if:  You are sexually active and are younger than 24 years.  You are older than 24 years and your health  care provider tells you that you are at risk for this type of infection.  Your sexual activity has changed since you were last screened and you are at an increased risk for chlamydia or gonorrhea. Ask your health care provider if you are at risk.  If you are at risk of being infected with HIV, it is recommended that you take a prescription medicine daily to prevent HIV infection. This is called preexposure prophylaxis (PrEP). You are considered at risk if:  You are a heterosexual woman, are sexually active, and are at increased risk for HIV infection.  You take  drugs by injection.  You are sexually active with a partner who has HIV.  Talk with your health care provider about whether you are at high risk of being infected with HIV. If you choose to begin PrEP, you should first be tested for HIV. You should then be tested every 3 months for as long as you are taking PrEP.  Osteoporosis is a disease in which the bones lose minerals and strength with aging. This can result in serious bone fractures or breaks. The risk of osteoporosis can be identified using a bone density scan. Women ages 47 years and over and women at risk for fractures or osteoporosis should discuss screening with their health care providers. Ask your health care provider whether you should take a calcium supplement or vitamin D to reduce the rate of osteoporosis.  Menopause can be associated with physical symptoms and risks. Hormone replacement therapy is available to decrease symptoms and risks. You should talk to your health care provider about whether hormone replacement therapy is right for you.  Use sunscreen. Apply sunscreen liberally and repeatedly throughout the day. You should seek shade when your shadow is shorter than you. Protect yourself by wearing long sleeves, pants, a wide-brimmed hat, and sunglasses year round, whenever you are outdoors.  Once a month, do a whole body skin exam, using a mirror to look at the skin  on your back. Tell your health care provider of new moles, moles that have irregular borders, moles that are larger than a pencil eraser, or moles that have changed in shape or color.  Stay current with required vaccines (immunizations).  Influenza vaccine. All adults should be immunized every year.  Tetanus, diphtheria, and acellular pertussis (Td, Tdap) vaccine. Pregnant women should receive 1 dose of Tdap vaccine during each pregnancy. The dose should be obtained regardless of the length of time since the last dose. Immunization is preferred during the 27th-36th week of gestation. An adult who has not previously received Tdap or who does not know her vaccine status should receive 1 dose of Tdap. This initial dose should be followed by tetanus and diphtheria toxoids (Td) booster doses every 10 years. Adults with an unknown or incomplete history of completing a 3-dose immunization series with Td-containing vaccines should begin or complete a primary immunization series including a Tdap dose. Adults should receive a Td booster every 10 years.  Varicella vaccine. An adult without evidence of immunity to varicella should receive 2 doses or a second dose if she has previously received 1 dose. Pregnant females who do not have evidence of immunity should receive the first dose after pregnancy. This first dose should be obtained before leaving the health care facility. The second dose should be obtained 4-8 weeks after the first dose.  Human papillomavirus (HPV) vaccine. Females aged 13-26 years who have not received the vaccine previously should obtain the 3-dose series. The vaccine is not recommended for use in pregnant females. However, pregnancy testing is not needed before receiving a dose. If a female is found to be pregnant after receiving a dose, no treatment is needed. In that case, the remaining doses should be delayed until after the pregnancy. Immunization is recommended for any person with an  immunocompromised condition through the age of 68 years if she did not get any or all doses earlier. During the 3-dose series, the second dose should be obtained 4-8 weeks after the first dose. The third dose should be obtained 24 weeks after the first dose and  16 weeks after the second dose.  Zoster vaccine. One dose is recommended for adults aged 62 years or older unless certain conditions are present.  Measles, mumps, and rubella (MMR) vaccine. Adults born before [birth year] generally are considered immune to measles and mumps. Adults born in [birth year] or later should have 1 or more doses of MMR vaccine unless there is a contraindication to the vaccine or there is laboratory evidence of immunity to each of the three diseases. A routine second dose of MMR vaccine should be obtained at least 28 days after the first dose for students attending postsecondary schools, health care workers, or international travelers. People who received inactivated measles vaccine or an unknown type of measles vaccine during 1963-1967 should receive 2 doses of MMR vaccine. People who received inactivated mumps vaccine or an unknown type of mumps vaccine before 1979 and are at high risk for mumps infection should consider immunization with 2 doses of MMR vaccine. For females of childbearing age, rubella immunity should be determined. If there is no evidence of immunity, females who are not pregnant should be vaccinated. If there is no evidence of immunity, females who are pregnant should delay immunization until after pregnancy. Unvaccinated health care workers born before [birth year] who lack laboratory evidence of measles, mumps, or rubella immunity or laboratory confirmation of disease should consider measles and mumps immunization with 2 doses of MMR vaccine or rubella immunization with 1 dose of MMR vaccine.  Pneumococcal 13-valent conjugate (PCV13) vaccine. When indicated, a person who is uncertain of her immunization history and has no record  of immunization should receive the PCV13 vaccine. An adult aged 6 years or older who has certain medical conditions and has not been previously immunized should receive 1 dose of PCV13 vaccine. This PCV13 should be followed with a dose of pneumococcal polysaccharide (PPSV23) vaccine. The PPSV23 vaccine dose should be obtained at least 8 weeks after the dose of PCV13 vaccine. An adult aged 86 years or older who has certain medical conditions and previously received 1 or more doses of PPSV23 vaccine should receive 1 dose of PCV13. The PCV13 vaccine dose should be obtained 1 or more years after the last PPSV23 vaccine dose.  Pneumococcal polysaccharide (PPSV23) vaccine. When PCV13 is also indicated, PCV13 should be obtained first. All adults aged 15 years and older should be immunized. An adult younger than age 27 years who has certain medical conditions should be immunized. Any person who resides in a nursing home or long-term care facility should be immunized. An adult smoker should be immunized. People with an immunocompromised condition and certain other conditions should receive both PCV13 and PPSV23 vaccines. People with human immunodeficiency virus (HIV) infection should be immunized as soon as possible after diagnosis. Immunization during chemotherapy or radiation therapy should be avoided. Routine use of PPSV23 vaccine is not recommended for American Indians, [place] Natives, or people younger than 65 years unless there are medical conditions that require PPSV23 vaccine. When indicated, people who have unknown immunization and have no record of immunization should receive PPSV23 vaccine. One-time revaccination 5 years after the first dose of PPSV23 is recommended for people aged 19-64 years who have chronic kidney failure, nephrotic syndrome, asplenia, or immunocompromised conditions. People who received 1-2 doses of PPSV23 before age 66 years should receive another dose of PPSV23 vaccine at age 53 years or  later if at least 5 years have passed since the previous dose. Doses of PPSV23 are not needed for people immunized with PPSV23 at  or after age 19 years.  Meningococcal vaccine. Adults with asplenia or persistent complement component deficiencies should receive 2 doses of quadrivalent meningococcal conjugate (MenACWY-D) vaccine. The doses should be obtained at least 2 months apart. Microbiologists working with certain meningococcal bacteria, [employer] recruits, people at risk during an outbreak, and people who travel to or live in countries with a high rate of meningitis should be immunized. A first-year college student up through age 49 years who is living in a residence hall should receive a dose if she did not receive a dose on or after her 16th birthday. Adults who have certain high-risk conditions should receive one or more doses of vaccine.  Hepatitis A vaccine. Adults who wish to be protected from this disease, have certain high-risk conditions, work with hepatitis A-infected animals, work in hepatitis A research labs, or travel to or work in countries with a high rate of hepatitis A should be immunized. Adults who were previously unvaccinated and who anticipate close contact with an international adoptee during the first 60 days after arrival in the [place] from a country with a high rate of hepatitis A should be immunized.  Hepatitis B vaccine. Adults who wish to be protected from this disease, have certain high-risk conditions, may be exposed to blood or other infectious body fluids, are household contacts or sex partners of hepatitis B positive people, are clients or workers in certain care facilities, or travel to or work in countries with a high rate of hepatitis B should be immunized.  Haemophilus influenzae type b (Hib) vaccine. A previously unvaccinated person with asplenia or sickle cell disease or having a scheduled splenectomy should receive 1 dose of Hib vaccine. Regardless of  previous immunization, a recipient of a hematopoietic stem cell transplant should receive a 3-dose series 6-12 months after her successful transplant. Hib vaccine is not recommended for adults with HIV infection. Preventive Services / Frequency AAges 65 years and over  Blood pressure check.** / Every 1 to 2 years.  Lipid and cholesterol check.** / Every 5 years beginning at age 7 years.  Lung cancer screening. / Every year if you are aged 29-80 years and have a 30-pack-year history of smoking and currently smoke or have quit within the past 15 years. Yearly screening is stopped once you have quit smoking for at least 15 years or develop a health problem that would prevent you from having lung cancer treatment.  Clinical breast exam.** / Every year after age 36 years.  BRCA-related cancer risk assessment.** / For women who have family members with a BRCA-related cancer (breast, ovarian, tubal, or peritoneal cancers).  Mammogram.** / Every year beginning at age 83 years and continuing for as long as you are in good health. Consult with your health care provider.  Pap test.** / Every 3 years starting at age 105 years through age 56 or 62 years with 3 consecutive normal Pap tests. Testing can be stopped between 65 and 70 years with 3 consecutive normal Pap tests and no abnormal Pap or HPV tests in the past 10 years.  HPV screening.** / Every 3 years from ages 61 years through ages 84 or 44 years with a history of 3 consecutive normal Pap tests. Testing can be stopped between 65 and 70 years with 3 consecutive normal Pap tests and no abnormal Pap or HPV tests in the past 10 years.  Fecal occult blood test (FOBT) of stool. / Every year beginning at age 84 years and continuing until age  75 years. You may not need to do this test if you get a colonoscopy every 10 years.  Flexible sigmoidoscopy or colonoscopy.** / Every 5 years for a flexible sigmoidoscopy or every 10 years for a colonoscopy beginning  at age 29 years and continuing until age 73 years.  Hepatitis C blood test.** / For all people born from [birth year] through 1963 and any individual with known risks for hepatitis C.  Osteoporosis screening.** / A one-time screening for women ages 59 years and over and women at risk for fractures or osteoporosis.  Skin self-exam. / Monthly.  Influenza vaccine. / Every year.  Tetanus, diphtheria, and acellular pertussis (Tdap/Td) vaccine.** / 1 dose of Td every 10 years.  Varicella vaccine.** / Consult your health care provider.  Zoster vaccine.** / 1 dose for adults aged 89 years or older.  Pneumococcal 13-valent conjugate (PCV13) vaccine.** / Consult your health care provider.  Pneumococcal polysaccharide (PPSV23) vaccine.** / 1 dose for all adults aged 50 years and older.  Meningococcal vaccine.** / Consult your health care provider.  Hepatitis A vaccine.** / Consult your health care provider.  Hepatitis B vaccine.** / Consult your health care provider.  Haemophilus influenzae type b (Hib) vaccine.** / Consult your health care provider. ** Family history and personal history of risk and conditions may change your health care provider's recommendations. Document Released: 12/24/2001 Document Revised: 03/14/2014 Document Reviewed: 03/25/2011 [facility] Patient Information 2015 [place], [place]. This information is not intended to replace advice given to you by your health care provider. Make sure you discuss any questions you have with your health care provider.

## 2014-07-29 NOTE — Progress Notes (Signed)
Patient ID: Meghan Anderson, female   DOB: Sep 11, 1936, 78 y.o.   MRN: [id number] Subjective:    Meghan Anderson is a 78 y.o. female who presents for Medicare Initial Wellness Visit.  Preventive Screening-Counseling & Management  Tobacco History  Smoking status  . Former Smoker  . Quit date: 11/11/2010  Smokeless tobacco  . Not on file     Current Problems (verified) Patient Active Problem List   Diagnosis Date Noted  . COPD (chronic obstructive pulmonary disease) 07/29/2014  . GERD (gastroesophageal reflux disease) 07/29/2014  . Essential hypertension, benign 07/29/2014  . Heart murmur 07/29/2014  . Arthritis 07/29/2014  . Osteopenia 07/29/2014  . Hypothyroidism 07/29/2014  . Hyperlipidemia 07/29/2014    Medications Prior to Visit Current Outpatient Prescriptions on File Prior to Visit  Medication Sig Dispense Refill  . albuterol (PROVENTIL) (2.5 MG/3ML) 0.083% nebulizer solution NEBULIZE 1 VIAL EVERY 4 HOURS AS NEEDED  150 mL  2  . atorvastatin (LIPITOR) 20 MG tablet TAKE 1 TABLET DAILY AT 6PM  30 tablet  2  . levothyroxine (SYNTHROID, LEVOTHROID) 112 MCG tablet Take 1 tablet (112 mcg total) by mouth daily.  90 tablet  3  . omeprazole (PRILOSEC) 40 MG capsule TAKE ONE (1) CAPSULE EACH DAY  90 capsule  3  . tiotropium (SPIRIVA HANDIHALER) 18 MCG inhalation capsule Place 1 capsule (18 mcg total) into inhaler and inhale daily.  90 capsule  3  . amLODipine (NORVASC) 5 MG tablet Take 1 tablet (5 mg total) by mouth daily.  30 tablet  3   No current facility-administered medications on file prior to visit.    Current Medications (verified) Current Outpatient Prescriptions  Medication Sig Dispense Refill  . albuterol (PROVENTIL) (2.5 MG/3ML) 0.083% nebulizer solution NEBULIZE 1 VIAL EVERY 4 HOURS AS NEEDED  150 mL  2  . atorvastatin (LIPITOR) 20 MG tablet TAKE 1 TABLET DAILY AT 6PM  30 tablet  2  . fexofenadine (ALLEGRA) 180 MG tablet Take 180 mg by mouth daily.      .  Fluticasone Furoate-Vilanterol (BREO ELLIPTA) 100-25 MCG/INH AEPB Inhale 1 puff into the lungs.      Marland Kitchen levothyroxine (SYNTHROID, LEVOTHROID) 112 MCG tablet Take 1 tablet (112 mcg total) by mouth daily.  90 tablet  3  . lisinopril-hydrochlorothiazide (PRINZIDE,ZESTORETIC) 20-12.5 MG per tablet Take 2 tablets by mouth daily.      Marland Kitchen omeprazole (PRILOSEC) 40 MG capsule TAKE ONE (1) CAPSULE EACH DAY  90 capsule  3  . tiotropium (SPIRIVA HANDIHALER) 18 MCG inhalation capsule Place 1 capsule (18 mcg total) into inhaler and inhale daily.  90 capsule  3  . amLODipine (NORVASC) 5 MG tablet Take 1 tablet (5 mg total) by mouth daily.  30 tablet  3  . fluticasone (FLONASE) 50 MCG/ACT nasal spray Place 2 sprays into the nose daily as needed.       No current facility-administered medications for this visit.     Allergies (verified) Prednisone and Quinolones   PAST HISTORY  Family History Family History  Problem Relation Age of Onset  . Kidney disease Mother     nephrectomy x 1  . COPD Brother   . Emphysema Brother   . Asthma Brother   . Cancer Brother 34    brain    Social History History  Substance Use Topics  . Smoking status: Former Smoker    Quit date: 11/11/2010  . Smokeless tobacco: Not on file  . Alcohol Use: No  Are there smokers in your home (other than you)? No  Risk Factors Current exercise habits: The patient does not participate in regular exercise at present. Continues to garden at home.  Dietary issues discussed: none   Cardiac risk factors: advanced age (older than 49 for men, 36 for women), dyslipidemia, hypertension and sedentary lifestyle.  Depression Screen (Note: if answer to either of the following is "Yes", a more complete depression screening is indicated)   Over the past 2 weeks, have you felt down, depressed or hopeless? No  Over the past 2 weeks, have you felt little interest or pleasure in doing things? No  Have you lost interest or pleasure in daily  life? No  Do you often feel hopeless? No  Do you cry easily over simple problems? No  Activities of Daily Living In your present state of health, do you have any difficulty performing the following activities?:  Driving? No Managing money?  No Feeding yourself? No Getting from bed to chair? No  Climbing a flight of stairs? Yes - has 12 stairs at home - gets SOB related to COPD at times - in care of pulmonologist and is due to follow up in the next month. Preparing food and eating?: No Bathing or showering? No Getting dressed: No Getting to the toilet? No Using the toilet:No Moving around from place to place: No In the past year have you fallen or had a near fall?:No   Are you sexually active?  No  Do you have more than one partner?  No  Hearing Difficulties: Yes Do you often ask people to speak up or repeat themselves? Yes Do you experience ringing or noises in your ears? Yes Do you have difficulty understanding soft or whispered voices? Yes   Do you feel that you have a problem with memory? No  Do you often misplace items? No  Do you feel safe at home?  Yes  Cognitive Testing  Alert? Yes  Normal Appearance?Yes  Oriented to person? Yes  Place? Yes   Time? Yes  Recall of three objects?  Yes  Can perform simple calculations? Yes  Displays appropriate judgment?Yes  Can read the correct time from a watch face?Yes   Advanced Directives have been discussed with the patient? Yes  List the Names of Other Physician/Practitioners you currently use: 1.  Javaid - pulmonary 2.  Renaldo- cardio 3.  Audiologist but patient does not remember name  Indicate any recent Medical Services you may have received from other than [facility] providers in the past year (date may be approximate).   There is no immunization history on file for this patient.  Screening Tests Health Maintenance  Topic Date Due  . Tetanus/tdap  02/23/1957  . Colonoscopy  02/24/1988  . Zostavax  02/23/1998  .  Pneumococcal Polysaccharide Vaccine Age 82 And Over  08/11/2014 (Originally 02/24/2003)  . Influenza Vaccine  08/15/2014 (Originally 06/11/2014)    All answers were reviewed with the patient and necessary referrals were made:  Cherre Robins, [facility]   07/29/2014   History reviewed: allergies, current medications, past family history, past medical history, past social history, past surgical history and problem list  Objective:    Body mass index is 22.65 kg/(m^2). BP 142/84  Pulse 70  Ht 5\' 4"  (1.626 m)  Wt 132 lb (59.875 kg)  BMI 22.65 kg/m2   Assessment:     Initial Medicare Wellness Visit Hypothyroidism HTN - not at goal today but has not taken meds today since fasting  Plan:     During the course of the visit the patient was educated and counseled about appropriate screening and preventive services including:    Pneumococcal vaccine - pt planning to get at pulmonologist's appt 08/10/14  Influenza vaccine -pt planning to get at pulmonologist's appt 08/10/14  Td vaccine - patient declined  Shingles / Zostavax Vaccine - patient declined  Screening electrocardiogram - UTD  Screening mammography - patient declined  Screening Pap smear and pelvic exam - no longer required  Bone densitometry screening - referral made today  Colorectal cancer screening - FOBT given today  Glaucoma screening - reminded eye exam needed  Advanced directives: Caring connections packet given  Diet review for nutrition referral? Yes __declined by patinet__  Not Indicated ____   Patient Instructions (the written plan) was given to the patient.  Medicare Attestation I have personally reviewed: The patient's medical and social history Their use of alcohol, tobacco or illicit drugs Their current medications and supplements The patient's functional ability including ADLs,fall risks, home safety risks, cognitive, and hearing and visual impairment Diet and physical activities Evidence for  depression or mood disorders  The patient's weight, height, BMI, and HR/BP have been recorded in the chart.  I have made referrals, counseling, and provided education to the patient based on review of the above and I have provided the patient with a written personalized care plan for preventive services.     Cherre Robins, [facility]   07/29/2014

## 2014-07-30 LAB — CMP14+EGFR
A/G RATIO: 1.9 (ref 1.1–2.5)
ALT: 14 IU/L (ref 0–32)
AST: 17 IU/L (ref 0–40)
Albumin: 4.4 g/dL (ref 3.5–4.8)
Alkaline Phosphatase: 62 IU/L (ref 39–117)
BUN/Creatinine Ratio: 17 (ref 11–26)
BUN: 17 mg/dL (ref 8–27)
CALCIUM: 9.2 mg/dL (ref 8.7–10.3)
CO2: 26 mmol/L (ref 18–29)
Chloride: 100 mmol/L (ref 97–108)
Creatinine, Ser: 0.98 mg/dL (ref 0.57–1.00)
GFR calc Af Amer: 65 mL/min/{1.73_m2} (ref >59)
GFR calc non Af Amer: 56 mL/min/{1.73_m2} — ABNORMAL LOW (ref >59)
GLUCOSE: 102 mg/dL — AB (ref 65–99)
Globulin, Total: 2.3 g/dL (ref 1.5–4.5)
POTASSIUM: 3.8 mmol/L (ref 3.5–5.2)
Sodium: 143 mmol/L (ref 134–144)
TOTAL PROTEIN: 6.7 g/dL (ref 6.0–8.5)
Total Bilirubin: 0.3 mg/dL (ref 0.0–1.2)

## 2014-07-30 LAB — LIPID PANEL
CHOL/HDL RATIO: 3.5 ratio (ref 0.0–4.4)
Cholesterol, Total: 149 mg/dL (ref 100–199)
HDL: 43 mg/dL (ref >39)
LDL CALC: 61 mg/dL (ref 0–99)
Triglycerides: 223 mg/dL — ABNORMAL HIGH (ref 0–149)
VLDL Cholesterol Cal: 45 mg/dL — ABNORMAL HIGH (ref 5–40)

## 2014-07-30 LAB — THYROID PANEL WITH TSH
Free Thyroxine Index: 3.3 (ref 1.2–4.9)
T3 Uptake Ratio: 31 % (ref 24–39)
T4 TOTAL: 10.5 ug/dL (ref 4.5–12.0)
TSH: 1.25 u[IU]/mL (ref 0.450–4.500)

## 2014-08-02 ENCOUNTER — Other Ambulatory Visit: Payer: Self-pay | Admitting: Pharmacist

## 2014-08-02 DIAGNOSIS — E039 Hypothyroidism, unspecified: Secondary | ICD-10-CM

## 2014-08-02 DIAGNOSIS — Z13820 Encounter for screening for osteoporosis: Secondary | ICD-10-CM

## 2014-08-03 ENCOUNTER — Telehealth: Payer: Self-pay | Admitting: Pharmacist

## 2014-08-03 ENCOUNTER — Other Ambulatory Visit: Payer: Medicare Other

## 2014-08-03 ENCOUNTER — Ambulatory Visit: Payer: Medicare Other

## 2014-08-03 NOTE — Telephone Encounter (Signed)
Patient notified of lab results from last week.  Discussed elevated Tg and FBG - to limit high CHO foods - sweets, breads, juices and sweet beverages, rice potatoes.  Increase fresh non starchy vegetables and fruits.  Recheck in 3 months.  Also left FOBT at front desk for patient to pick up.

## 2014-08-05 ENCOUNTER — Other Ambulatory Visit: Payer: Medicare Other

## 2014-08-05 DIAGNOSIS — Z1212 Encounter for screening for malignant neoplasm of rectum: Secondary | ICD-10-CM | POA: Diagnosis not present

## 2014-08-05 NOTE — Progress Notes (Signed)
Lab only 

## 2014-08-07 LAB — FECAL OCCULT BLOOD, IMMUNOCHEMICAL: FECAL OCCULT BLD: NEGATIVE

## 2014-08-09 ENCOUNTER — Encounter: Payer: Self-pay | Admitting: *Deleted

## 2014-08-10 DIAGNOSIS — R05 Cough: Secondary | ICD-10-CM | POA: Diagnosis not present

## 2014-08-10 DIAGNOSIS — R0609 Other forms of dyspnea: Secondary | ICD-10-CM | POA: Diagnosis not present

## 2014-08-10 DIAGNOSIS — R059 Cough, unspecified: Secondary | ICD-10-CM | POA: Diagnosis not present

## 2014-08-10 DIAGNOSIS — J449 Chronic obstructive pulmonary disease, unspecified: Secondary | ICD-10-CM | POA: Diagnosis not present

## 2014-08-10 DIAGNOSIS — R0989 Other specified symptoms and signs involving the circulatory and respiratory systems: Secondary | ICD-10-CM | POA: Diagnosis not present

## 2014-08-10 DIAGNOSIS — R062 Wheezing: Secondary | ICD-10-CM | POA: Diagnosis not present

## 2014-08-27 ENCOUNTER — Ambulatory Visit (INDEPENDENT_AMBULATORY_CARE_PROVIDER_SITE_OTHER): Payer: Medicare Other | Admitting: Family Medicine

## 2014-08-27 ENCOUNTER — Encounter: Payer: Self-pay | Admitting: Family Medicine

## 2014-08-27 VITALS — BP 139/83 | HR 97 | Temp 98.5°F | Ht 64.0 in | Wt 131.0 lb

## 2014-08-27 DIAGNOSIS — J440 Chronic obstructive pulmonary disease with acute lower respiratory infection: Secondary | ICD-10-CM | POA: Diagnosis not present

## 2014-08-27 DIAGNOSIS — R059 Cough, unspecified: Secondary | ICD-10-CM

## 2014-08-27 DIAGNOSIS — R0989 Other specified symptoms and signs involving the circulatory and respiratory systems: Secondary | ICD-10-CM | POA: Diagnosis not present

## 2014-08-27 DIAGNOSIS — R05 Cough: Secondary | ICD-10-CM

## 2014-08-27 DIAGNOSIS — I700 Atherosclerosis of aorta: Secondary | ICD-10-CM | POA: Diagnosis not present

## 2014-08-27 MED ORDER — AZITHROMYCIN 250 MG PO TABS: ORAL_TABLET | ORAL | Status: DC

## 2014-08-27 NOTE — Progress Notes (Addendum)
Subjective:    Patient ID: Meghan Anderson, female    DOB: [birth year], 78 y.o.   MRN: [id number]  HPI Patient here today for copd, cough, and congestion that started about 1 week ago.        Patient Active Problem List   Diagnosis Date Noted  . COPD (chronic obstructive pulmonary disease) 07/29/2014  . GERD (gastroesophageal reflux disease) 07/29/2014  . Essential hypertension, benign 07/29/2014  . Heart murmur 07/29/2014  . Arthritis 07/29/2014  . Osteopenia 07/29/2014  . Hypothyroidism 07/29/2014  . Hyperlipidemia 07/29/2014   Outpatient Encounter Prescriptions as of 08/27/2014  Medication Sig  . albuterol (PROVENTIL) (2.5 MG/3ML) 0.083% nebulizer solution NEBULIZE 1 VIAL EVERY 4 HOURS AS NEEDED  . amLODipine (NORVASC) 5 MG tablet Take 1 tablet (5 mg total) by mouth daily.  Marland Kitchen atorvastatin (LIPITOR) 20 MG tablet TAKE 1 TABLET DAILY AT 6PM  . fluticasone (FLONASE) 50 MCG/ACT nasal spray Place 2 sprays into both nostrils daily as needed.  . Fluticasone Furoate-Vilanterol (BREO ELLIPTA) 100-25 MCG/INH AEPB Inhale 1 puff into the lungs.  Marland Kitchen levothyroxine (SYNTHROID, LEVOTHROID) 112 MCG tablet Take 1 tablet (112 mcg total) by mouth daily.  Marland Kitchen lisinopril-hydrochlorothiazide (PRINZIDE,ZESTORETIC) 20-12.5 MG per tablet Take 2 tablets by mouth daily.  Marland Kitchen omeprazole (PRILOSEC) 40 MG capsule TAKE ONE (1) CAPSULE EACH DAY  . tiotropium (SPIRIVA HANDIHALER) 18 MCG inhalation capsule Place 1 capsule (18 mcg total) into inhaler and inhale daily.  . [DISCONTINUED] fexofenadine (ALLEGRA) 180 MG tablet Take 180 mg by mouth daily.    Review of Systems  Constitutional: Positive for fever (unknown).  HENT: Positive for congestion.   Eyes: Negative.   Respiratory: Positive for cough and shortness of breath.   Cardiovascular: Negative.   Gastrointestinal: Negative.   Endocrine: Negative.   Genitourinary: Negative.   Musculoskeletal: Negative.   Skin: Negative.   Allergic/Immunologic:  Negative.   Neurological: Negative.   Hematological: Negative.   Psychiatric/Behavioral: Negative.        Objective:   Physical Exam  Nursing note and vitals reviewed. Constitutional: She is oriented to person, place, and time. She appears well-developed and well-nourished. No distress.  HENT:  Head: Normocephalic and atraumatic.  Right Ear: External ear normal.  Left Ear: External ear normal.  Mouth/Throat: Oropharynx is clear and moist. No oropharyngeal exudate.  Nasal congestion bilaterally  Eyes: Conjunctivae and EOM are normal. Pupils are equal, round, and reactive to light. Right eye exhibits no discharge. Left eye exhibits no discharge. No scleral icterus.  Neck: Normal range of motion. Neck supple. No thyromegaly present.  No anterior cervical nodes  Cardiovascular: Normal rate and normal heart sounds.   No murmur heard.  The heart was slightly irregular at 84 per minute  Pulmonary/Chest: Effort normal. No respiratory distress. She has wheezes. She has rales. She exhibits no tenderness.  The breath sounds were distant and there was expiratory wheezes. The right lung base posteriorly had increased congestion and a few sparse rales.  Abdominal: Soft. Bowel sounds are normal. She exhibits no mass. There is no tenderness. There is no rebound and no guarding.  Musculoskeletal: Normal range of motion. She exhibits no edema.  Lymphadenopathy:    She has no cervical adenopathy.  Neurological: She is alert and oriented to person, place, and time.  Skin: Skin is warm and dry. No rash noted.  Psychiatric: She has a normal mood and affect. Her behavior is normal. Judgment and thought content normal.   BP 139/83  Pulse 97  Temp(Src) 98.5 F (36.9 C) (Oral)  Ht 5\' 4"  (1.626 m)  Wt 131 lb (59.421 kg)  BMI 22.47 kg/m2  The chest x-ray from 08/29/2014 revealed a small pleural effusion and atherosclerosis of the thoracic aorta      Assessment & Plan:  1. Cough - DG Chest 2 View;  Future - POCT CBC; Future - azithromycin (ZITHROMAX) 250 MG tablet; 2 pills the first day then one daily for infection until complete  Dispense: 6 tablet; Refill: 0  2. Chest congestion - DG Chest 2 View; Future - POCT CBC; Future - azithromycin (ZITHROMAX) 250 MG tablet; 2 pills the first day then one daily for infection until complete  Dispense: 6 tablet; Refill: 0  3. Bronchitis, chronic obstructive w acute bronchitis - azithromycin (ZITHROMAX) 250 MG tablet; 2 pills the first day then one daily for infection until complete  Dispense: 6 tablet; Refill: 0  Meds ordered this encounter  Medications  . azithromycin (ZITHROMAX) 250 MG tablet    Sig: 2 pills the first day then one daily for infection until complete    Dispense:  6 tablet    Refill:  0   Patient Instructions  Take Mucinex maximum strength, blue and white in color, 1 twice daily with a large glass of water for cough and congestion Use albuterol nebulizer solution 4 times daily to help with breathing and wheezing Continue to use Spiriva and Breo Return to clinic Monday for chest x-ray and CBC  Drink plenty of fluids and take Tylenol for aches pains and fever   Arrie Senate MD

## 2014-08-27 NOTE — Patient Instructions (Signed)
Take Mucinex maximum strength, blue and white in color, 1 twice daily with a large glass of water for cough and congestion Use albuterol nebulizer solution 4 times daily to help with breathing and wheezing Continue to use Spiriva and Breo Return to clinic Monday for chest x-ray and CBC  Drink plenty of fluids and take Tylenol for aches pains and fever

## 2014-08-29 ENCOUNTER — Other Ambulatory Visit: Payer: Medicare Other

## 2014-08-29 ENCOUNTER — Ambulatory Visit (INDEPENDENT_AMBULATORY_CARE_PROVIDER_SITE_OTHER): Payer: Medicare Other

## 2014-08-29 DIAGNOSIS — R0989 Other specified symptoms and signs involving the circulatory and respiratory systems: Secondary | ICD-10-CM | POA: Diagnosis not present

## 2014-08-29 DIAGNOSIS — R05 Cough: Secondary | ICD-10-CM

## 2014-08-29 DIAGNOSIS — R059 Cough, unspecified: Secondary | ICD-10-CM

## 2014-08-30 ENCOUNTER — Telehealth: Payer: Self-pay

## 2014-08-30 NOTE — Telephone Encounter (Signed)
Daughter-in law aware of CXR results and the need for repeat CXR in 3-4 weeks; Pt aware of results also

## 2014-08-30 NOTE — Telephone Encounter (Signed)
Message copied by Koren Bound on Tue Aug 30, 2014  1:50 PM ------      Message from: Chipper Herb      Created: Mon Aug 29, 2014  5:31 PM       As per radiology report-----please call patient and family member. Make sure that patient takes antibiotic until it is completed. She should have her chest x-ray repeated in 3-4 weeks or sooner if she does not get better. Please inform the patient that the x-ray revealed thoracic atherosclerosis in addition to the small pleural effusion. It also revealed osteopenia and if she has not had a DEXA scan she should get a DEXA scan done also. ------

## 2014-08-30 NOTE — Telephone Encounter (Signed)
Message copied by Koren Bound on Tue Aug 30, 2014  1:47 PM ------      Message from: Chipper Herb      Created: Mon Aug 29, 2014  5:31 PM       As per radiology report-----please call patient and family member. Make sure that patient takes antibiotic until it is completed. She should have her chest x-ray repeated in 3-4 weeks or sooner if she does not get better. Please inform the patient that the x-ray revealed thoracic atherosclerosis in addition to the small pleural effusion. It also revealed osteopenia and if she has not had a DEXA scan she should get a DEXA scan done also. ------

## 2014-08-31 ENCOUNTER — Other Ambulatory Visit: Payer: Self-pay | Admitting: *Deleted

## 2014-08-31 DIAGNOSIS — Z780 Asymptomatic menopausal state: Secondary | ICD-10-CM

## 2014-08-31 DIAGNOSIS — Z13820 Encounter for screening for osteoporosis: Secondary | ICD-10-CM

## 2014-09-07 ENCOUNTER — Other Ambulatory Visit: Payer: Medicare Other

## 2014-09-07 ENCOUNTER — Ambulatory Visit: Payer: Medicare Other

## 2014-09-08 ENCOUNTER — Other Ambulatory Visit: Payer: Self-pay | Admitting: Family Medicine

## 2014-12-29 ENCOUNTER — Ambulatory Visit (INDEPENDENT_AMBULATORY_CARE_PROVIDER_SITE_OTHER): Payer: Medicare Other | Admitting: Nurse Practitioner

## 2014-12-29 ENCOUNTER — Encounter: Payer: Self-pay | Admitting: Nurse Practitioner

## 2014-12-29 VITALS — BP 160/84 | HR 82 | Temp 97.3°F | Ht 64.0 in | Wt 139.0 lb

## 2014-12-29 DIAGNOSIS — E785 Hyperlipidemia, unspecified: Secondary | ICD-10-CM | POA: Diagnosis not present

## 2014-12-29 DIAGNOSIS — J449 Chronic obstructive pulmonary disease, unspecified: Secondary | ICD-10-CM | POA: Diagnosis not present

## 2014-12-29 DIAGNOSIS — I10 Essential (primary) hypertension: Secondary | ICD-10-CM | POA: Diagnosis not present

## 2014-12-29 DIAGNOSIS — K2100 Gastro-esophageal reflux disease with esophagitis, without bleeding: Secondary | ICD-10-CM

## 2014-12-29 DIAGNOSIS — Z13820 Encounter for screening for osteoporosis: Secondary | ICD-10-CM | POA: Diagnosis not present

## 2014-12-29 DIAGNOSIS — E039 Hypothyroidism, unspecified: Secondary | ICD-10-CM

## 2014-12-29 DIAGNOSIS — Z23 Encounter for immunization: Secondary | ICD-10-CM

## 2014-12-29 MED ORDER — AMLODIPINE BESYLATE 5 MG PO TABS: 5.0000 mg | ORAL_TABLET | Freq: Every day | ORAL | Status: DC

## 2014-12-29 NOTE — Progress Notes (Signed)
Subjective:    Patient ID: Meghan Anderson, female    DOB: 1935/12/15, 79 y.o.   MRN: [id number]  Hypertension This is a chronic problem. The current episode started more than 1 year ago. The problem is unchanged. The problem is controlled. Pertinent negatives include no blurred vision, headaches, palpitations, peripheral edema or shortness of breath. Risk factors for coronary artery disease include dyslipidemia, post-menopausal state and sedentary lifestyle. Past treatments include calcium channel blockers, ACE inhibitors and diuretics (patient had stopped norvacs because she ran out and just hasn't requested refill.). The current treatment provides moderate improvement. Compliance problems include diet and exercise.  Hypertensive end-organ damage includes a thyroid problem.  Hyperlipidemia This is a chronic problem. The current episode started more than 1 year ago. The problem is resistant. Recent lipid tests were reviewed and are variable. Exacerbating diseases include hypothyroidism. She has no history of diabetes or obesity. Factors aggravating her hyperlipidemia include thiazides. Pertinent negatives include no shortness of breath. Current antihyperlipidemic treatment includes statins. The current treatment provides moderate improvement of lipids. Compliance problems include adherence to diet and adherence to exercise.  Risk factors for coronary artery disease include dyslipidemia, hypertension and post-menopausal.  Thyroid Problem Presents for follow-up visit. Patient reports no diaphoresis, dry skin, menstrual problem or palpitations. The symptoms have been stable. Her past medical history is significant for hyperlipidemia. There is no history of diabetes.  GERD Omeprazole daily keeps symptoms under control COPD spirivia and BREO daily- no recent flare ups- has not needed albuterol lately  Review of Systems  Constitutional: Negative for diaphoresis.  HENT: Negative.   Eyes: Negative for  blurred vision.  Respiratory: Negative for shortness of breath.   Cardiovascular: Negative for palpitations.  Genitourinary: Negative for menstrual problem.  Neurological: Negative for headaches.  Psychiatric/Behavioral: Negative.   All other systems reviewed and are negative.      Objective:   Physical Exam  Constitutional: She is oriented to person, place, and time. She appears well-developed and well-nourished.  HENT:  Nose: Nose normal.  Mouth/Throat: Oropharynx is clear and moist.  Eyes: EOM are normal.  Neck: Trachea normal, normal range of motion and full passive range of motion without pain. Neck supple. No JVD present. Carotid bruit is not present. No thyromegaly present.  Cardiovascular: Normal rate, regular rhythm, normal heart sounds and intact distal pulses.  Exam reveals no gallop and no friction rub.   No murmur heard. Pulmonary/Chest: Effort normal. She has wheezes (exp wheezes throughout).  Wet cough  Abdominal: Soft. Bowel sounds are normal. She exhibits no distension and no mass. There is no tenderness.  Musculoskeletal: Normal range of motion.  Lymphadenopathy:    She has no cervical adenopathy.  Neurological: She is alert and oriented to person, place, and time. She has normal reflexes.  Skin: Skin is warm and dry.  Psychiatric: She has a normal mood and affect. Her behavior is normal. Judgment and thought content normal.    BP 160/84 mmHg  Pulse 82  Temp(Src) 97.3 F (36.3 C) (Oral)  Ht 5' 4" (1.626 m)  Wt 139 lb (63.05 kg)  BMI 23.85 kg/m2        Assessment & Plan:  1. Essential hypertension, benign Do not add slat to diet Get back on amlodipine- rx sent  To pharmacy - amLODipine (NORVASC) 5 MG tablet; Take 1 tablet (5 mg total) by mouth daily.  Dispense: 30 tablet; Refill: 5 - CMP14+EGFR  2. Chronic obstructive pulmonary disease, unspecified COPD, unspecified chronic bronchitis  type Avoid allergens  3. Gastroesophageal reflux disease with  esophagitis Avoid spicy foods Do not eat 2 hours prior to bedtime  4. Hypothyroidism, unspecified hypothyroidism type - Thyroid Panel With TSH  5. Hyperlipidemia Low fat diet - NMR, lipoprofile   Refuses most adult immunizations- did agree to prevnar 13 dexa ordered Labs pending Health maintenance reviewed Diet and exercise encouraged Continue all meds Follow up  In 3 months   [place], FNP

## 2014-12-29 NOTE — Patient Instructions (Signed)

## 2014-12-29 NOTE — Addendum Note (Signed)
Addended by: Rolena Infante on: 12/29/2014 11:56 AM   Modules accepted: Orders

## 2014-12-30 ENCOUNTER — Other Ambulatory Visit: Payer: Self-pay | Admitting: Nurse Practitioner

## 2014-12-30 LAB — CMP14+EGFR
ALBUMIN: 3.5 g/dL (ref 3.5–4.8)
ALT: 6 IU/L (ref 0–32)
AST: 13 IU/L (ref 0–40)
Albumin/Globulin Ratio: 1.5 (ref 1.1–2.5)
Alkaline Phosphatase: 67 IU/L (ref 39–117)
BUN / CREAT RATIO: 12 (ref 11–26)
BUN: 15 mg/dL (ref 8–27)
Bilirubin Total: 0.2 mg/dL (ref 0.0–1.2)
CALCIUM: 8.6 mg/dL — AB (ref 8.7–10.3)
CO2: 24 mmol/L (ref 18–29)
Chloride: 102 mmol/L (ref 97–108)
Creatinine, Ser: 1.27 mg/dL — ABNORMAL HIGH (ref 0.57–1.00)
GFR calc Af Amer: 47 mL/min/{1.73_m2} — ABNORMAL LOW (ref >59)
GFR calc non Af Amer: 41 mL/min/{1.73_m2} — ABNORMAL LOW (ref >59)
Globulin, Total: 2.3 g/dL (ref 1.5–4.5)
Glucose: 98 mg/dL (ref 65–99)
Potassium: 3.3 mmol/L — ABNORMAL LOW (ref 3.5–5.2)
Sodium: 143 mmol/L (ref 134–144)
TOTAL PROTEIN: 5.8 g/dL — AB (ref 6.0–8.5)

## 2014-12-30 LAB — NMR, LIPOPROFILE
Cholesterol: 178 mg/dL (ref 100–199)
HDL Cholesterol by NMR: 49 mg/dL (ref >39)
HDL PARTICLE NUMBER: 38.9 umol/L (ref >=30.5)
LDL Particle Number: 1087 nmol/L — ABNORMAL HIGH (ref <1000)
LDL Size: 20.7 nm (ref >20.5)
LDL-C: 83 mg/dL (ref 0–99)
LP-IR SCORE: 44 (ref <=45)
Small LDL Particle Number: 669 nmol/L — ABNORMAL HIGH (ref <=527)
Triglycerides by NMR: 230 mg/dL — ABNORMAL HIGH (ref 0–149)

## 2014-12-30 LAB — THYROID PANEL WITH TSH
FREE THYROXINE INDEX: 1.5 (ref 1.2–4.9)
T3 Uptake Ratio: 27 % (ref 24–39)
T4, Total: 5.6 ug/dL (ref 4.5–12.0)
TSH: 19.05 u[IU]/mL — ABNORMAL HIGH (ref 0.450–4.500)

## 2014-12-30 MED ORDER — LEVOTHYROXINE SODIUM 125 MCG PO TABS: 125.0000 ug | ORAL_TABLET | Freq: Every day | ORAL | Status: DC

## 2015-01-11 ENCOUNTER — Ambulatory Visit: Payer: Medicare Other

## 2015-01-11 ENCOUNTER — Other Ambulatory Visit: Payer: Medicare Other

## 2015-02-08 DIAGNOSIS — J449 Chronic obstructive pulmonary disease, unspecified: Secondary | ICD-10-CM | POA: Diagnosis not present

## 2015-02-08 DIAGNOSIS — R0600 Dyspnea, unspecified: Secondary | ICD-10-CM | POA: Diagnosis not present

## 2015-02-08 DIAGNOSIS — J309 Allergic rhinitis, unspecified: Secondary | ICD-10-CM | POA: Diagnosis not present

## 2015-04-14 ENCOUNTER — Other Ambulatory Visit: Payer: Self-pay | Admitting: Family Medicine

## 2015-05-12 DIAGNOSIS — J441 Chronic obstructive pulmonary disease with (acute) exacerbation: Secondary | ICD-10-CM | POA: Diagnosis not present

## 2015-05-12 DIAGNOSIS — E869 Volume depletion, unspecified: Secondary | ICD-10-CM | POA: Diagnosis not present

## 2015-05-12 DIAGNOSIS — M5030 Other cervical disc degeneration, unspecified cervical region: Secondary | ICD-10-CM | POA: Diagnosis not present

## 2015-05-12 DIAGNOSIS — Z79899 Other long term (current) drug therapy: Secondary | ICD-10-CM | POA: Diagnosis not present

## 2015-05-12 DIAGNOSIS — E876 Hypokalemia: Secondary | ICD-10-CM | POA: Diagnosis not present

## 2015-05-12 DIAGNOSIS — I10 Essential (primary) hypertension: Secondary | ICD-10-CM | POA: Diagnosis not present

## 2015-05-12 DIAGNOSIS — Z8249 Family history of ischemic heart disease and other diseases of the circulatory system: Secondary | ICD-10-CM | POA: Diagnosis not present

## 2015-05-12 DIAGNOSIS — M1990 Unspecified osteoarthritis, unspecified site: Secondary | ICD-10-CM | POA: Diagnosis not present

## 2015-05-12 DIAGNOSIS — J449 Chronic obstructive pulmonary disease, unspecified: Secondary | ICD-10-CM | POA: Diagnosis not present

## 2015-05-12 DIAGNOSIS — S0003XA Contusion of scalp, initial encounter: Secondary | ICD-10-CM | POA: Diagnosis not present

## 2015-05-12 DIAGNOSIS — Z888 Allergy status to other drugs, medicaments and biological substances status: Secondary | ICD-10-CM | POA: Diagnosis not present

## 2015-05-12 DIAGNOSIS — Z881 Allergy status to other antibiotic agents status: Secondary | ICD-10-CM | POA: Diagnosis not present

## 2015-05-12 DIAGNOSIS — E039 Hypothyroidism, unspecified: Secondary | ICD-10-CM | POA: Diagnosis not present

## 2015-05-12 DIAGNOSIS — R531 Weakness: Secondary | ICD-10-CM | POA: Diagnosis not present

## 2015-05-12 DIAGNOSIS — M419 Scoliosis, unspecified: Secondary | ICD-10-CM | POA: Diagnosis not present

## 2015-05-12 DIAGNOSIS — M47816 Spondylosis without myelopathy or radiculopathy, lumbar region: Secondary | ICD-10-CM | POA: Diagnosis not present

## 2015-05-12 DIAGNOSIS — N179 Acute kidney failure, unspecified: Secondary | ICD-10-CM | POA: Diagnosis not present

## 2015-05-12 DIAGNOSIS — Z87891 Personal history of nicotine dependence: Secondary | ICD-10-CM | POA: Diagnosis not present

## 2015-05-12 DIAGNOSIS — M47812 Spondylosis without myelopathy or radiculopathy, cervical region: Secondary | ICD-10-CM | POA: Diagnosis not present

## 2015-05-12 DIAGNOSIS — Z049 Encounter for examination and observation for unspecified reason: Secondary | ICD-10-CM | POA: Diagnosis not present

## 2015-05-12 DIAGNOSIS — K922 Gastrointestinal hemorrhage, unspecified: Secondary | ICD-10-CM | POA: Diagnosis not present

## 2015-05-12 DIAGNOSIS — R9431 Abnormal electrocardiogram [ECG] [EKG]: Secondary | ICD-10-CM | POA: Diagnosis not present

## 2015-05-12 DIAGNOSIS — J45909 Unspecified asthma, uncomplicated: Secondary | ICD-10-CM | POA: Diagnosis not present

## 2015-05-12 DIAGNOSIS — R55 Syncope and collapse: Secondary | ICD-10-CM | POA: Diagnosis not present

## 2015-05-13 DIAGNOSIS — J441 Chronic obstructive pulmonary disease with (acute) exacerbation: Secondary | ICD-10-CM | POA: Diagnosis not present

## 2015-05-13 DIAGNOSIS — I6523 Occlusion and stenosis of bilateral carotid arteries: Secondary | ICD-10-CM | POA: Diagnosis not present

## 2015-05-13 DIAGNOSIS — R55 Syncope and collapse: Secondary | ICD-10-CM | POA: Diagnosis not present

## 2015-05-13 DIAGNOSIS — S0003XA Contusion of scalp, initial encounter: Secondary | ICD-10-CM | POA: Diagnosis not present

## 2015-05-13 DIAGNOSIS — I313 Pericardial effusion (noninflammatory): Secondary | ICD-10-CM | POA: Diagnosis not present

## 2015-05-13 DIAGNOSIS — E876 Hypokalemia: Secondary | ICD-10-CM | POA: Diagnosis not present

## 2015-05-13 DIAGNOSIS — N179 Acute kidney failure, unspecified: Secondary | ICD-10-CM | POA: Diagnosis not present

## 2015-05-13 DIAGNOSIS — I340 Nonrheumatic mitral (valve) insufficiency: Secondary | ICD-10-CM | POA: Diagnosis not present

## 2015-05-13 DIAGNOSIS — I700 Atherosclerosis of aorta: Secondary | ICD-10-CM | POA: Diagnosis not present

## 2015-05-13 DIAGNOSIS — E869 Volume depletion, unspecified: Secondary | ICD-10-CM | POA: Diagnosis not present

## 2015-05-18 ENCOUNTER — Other Ambulatory Visit: Payer: Self-pay

## 2015-05-18 ENCOUNTER — Other Ambulatory Visit: Payer: Self-pay | Admitting: Nurse Practitioner

## 2015-05-18 MED ORDER — ALBUTEROL SULFATE (2.5 MG/3ML) 0.083% IN NEBU: INHALATION_SOLUTION | RESPIRATORY_TRACT | Status: DC

## 2015-05-18 MED ORDER — LISINOPRIL-HYDROCHLOROTHIAZIDE 20-12.5 MG PO TABS: 2.0000 | ORAL_TABLET | Freq: Every day | ORAL | Status: DC

## 2015-06-16 ENCOUNTER — Other Ambulatory Visit: Payer: Self-pay | Admitting: Nurse Practitioner

## 2015-06-27 ENCOUNTER — Telehealth: Payer: Self-pay | Admitting: *Deleted

## 2015-06-27 NOTE — Telephone Encounter (Signed)
Left message for patient to call back regarding follow up appointment on elevated BP

## 2015-06-28 ENCOUNTER — Other Ambulatory Visit: Payer: Self-pay | Admitting: Family Medicine

## 2015-06-28 NOTE — Telephone Encounter (Signed)
no more refills without being seen  

## 2015-06-28 NOTE — Telephone Encounter (Signed)
Last seen 12/29/14 MMM

## 2015-06-29 ENCOUNTER — Ambulatory Visit (INDEPENDENT_AMBULATORY_CARE_PROVIDER_SITE_OTHER): Payer: Medicare Other | Admitting: Family Medicine

## 2015-06-29 ENCOUNTER — Encounter: Payer: Self-pay | Admitting: Family Medicine

## 2015-06-29 VITALS — BP 155/90 | HR 79 | Temp 97.4°F | Ht 64.0 in | Wt 136.0 lb

## 2015-06-29 DIAGNOSIS — I10 Essential (primary) hypertension: Secondary | ICD-10-CM | POA: Diagnosis not present

## 2015-06-29 DIAGNOSIS — J410 Simple chronic bronchitis: Secondary | ICD-10-CM

## 2015-06-29 DIAGNOSIS — E039 Hypothyroidism, unspecified: Secondary | ICD-10-CM | POA: Insufficient documentation

## 2015-06-29 MED ORDER — AMLODIPINE BESYLATE 5 MG PO TABS: 5.0000 mg | ORAL_TABLET | Freq: Every day | ORAL | Status: DC

## 2015-06-29 MED ORDER — LISINOPRIL-HYDROCHLOROTHIAZIDE 20-12.5 MG PO TABS: 2.0000 | ORAL_TABLET | Freq: Every day | ORAL | Status: DC

## 2015-06-29 MED ORDER — TIOTROPIUM BROMIDE MONOHYDRATE 18 MCG IN CAPS: 18.0000 ug | ORAL_CAPSULE | Freq: Every day | RESPIRATORY_TRACT | Status: DC

## 2015-06-29 MED ORDER — LEVOTHYROXINE SODIUM 125 MCG PO TABS
125.0000 ug | ORAL_TABLET | Freq: Every day | ORAL | Status: DC
Start: 2015-06-29 — End: 2015-06-30

## 2015-06-29 NOTE — Assessment & Plan Note (Signed)
Patient needed a refill for spree with but she continues to see pulmonology for management of her COPD. We will refill today and she has an appointment to go see them soon.

## 2015-06-29 NOTE — Assessment & Plan Note (Signed)
Patient denies any symptoms of hypothyroidism or hyperthyroidism and is happy with her medication dose. We will recheck a level today but continue her medication for now.

## 2015-06-29 NOTE — Progress Notes (Signed)
BP 155/90 mmHg  Pulse 79  Temp(Src) 97.4 F (36.3 C)  Ht '5\' 4"'  (1.626 m)  Wt 136 lb (61.689 kg)  BMI 23.33 kg/m2   Subjective:    Patient ID: Meghan Anderson, female    DOB: 15-Mar-1936, 79 y.o.   MRN: [id number]  HPI: Meghan Anderson is a 79 y.o. female presenting on 06/29/2015 for Medication Refill and Fill out paperwork for handicap tag   HPI Hypertension Blood pressure today is 155/90. Patient is supposed to be on lisinopril hydrochlorothiazide combo 40-25 and Norvasc 5 mg but she is only taking the lisinopril hydrochlorothiazide combo and not the Norvasc. She denies any headaches, blurred vision, chest pain, shortness of breath.  Hypothyroidism Patient has been on 125 g Synthroid for at least a year. She denies any changes in hair, palpitations, chest pains, intolerance to heat or cold. Last TSH was 19 but her free T4 was within normal limits and this was performed last year. She denies any significant weight changes and is pretty happy with her medication dose.  COPD Patient has significant COPD and needs a refill for her Spiriva today. She does see a pulmonologist who manages most of his issues but we do refill her medications often at our office. She denies any new shortness of breath or worsening shortness of breath normal. She has an appointment with the pulmonologist within the month.  Relevant past medical, surgical, family and social history reviewed and updated as indicated. Interim medical history since our last visit reviewed. Allergies and medications reviewed and updated.  Review of Systems  Constitutional: Negative for fever, chills, fatigue and unexpected weight change.  HENT: Negative for congestion, ear discharge and ear pain.   Eyes: Negative for redness and visual disturbance.  Respiratory: Positive for shortness of breath (at baseline) and wheezing ( at baseline). Negative for chest tightness.   Cardiovascular: Negative for chest pain and leg swelling.    Gastrointestinal: Negative for diarrhea and constipation.  Endocrine: Negative for cold intolerance and heat intolerance.  Genitourinary: Negative for dysuria and difficulty urinating.  Musculoskeletal: Negative for back pain and gait problem.  Skin: Negative for rash.  Neurological: Negative for dizziness, speech difficulty, weakness, light-headedness and headaches.  Psychiatric/Behavioral: Negative for behavioral problems and agitation.  All other systems reviewed and are negative.   Per HPI unless specifically indicated above     Medication List       This list is accurate as of: 06/29/15  9:37 PM.  Always use your most recent med list.               albuterol (2.5 MG/3ML) 0.083% nebulizer solution  Commonly known as:  PROVENTIL  NEBULIZE 1 VIAL EVERY 4 HOURS AS NEEDED     amLODipine 5 MG tablet  Commonly known as:  NORVASC  Take 1 tablet (5 mg total) by mouth daily.     atorvastatin 20 MG tablet  Commonly known as:  LIPITOR  TAKE ONE (1) TABLET EACH DAY     BREO ELLIPTA 100-25 MCG/INH Aepb  Generic drug:  Fluticasone Furoate-Vilanterol  Inhale 1 puff into the lungs.     fluticasone 50 MCG/ACT nasal spray  Commonly known as:  FLONASE  Place 2 sprays into both nostrils daily as needed.     levothyroxine 125 MCG tablet  Commonly known as:  SYNTHROID, LEVOTHROID  Take 1 tablet (125 mcg total) by mouth daily.     lisinopril-hydrochlorothiazide 20-12.5 MG per tablet  Commonly known as:  PRINZIDE,ZESTORETIC  Take 2 tablets by mouth daily.     omeprazole 40 MG capsule  Commonly known as:  PRILOSEC  TAKE ONE (1) CAPSULE EACH DAY     tiotropium 18 MCG inhalation capsule  Commonly known as:  SPIRIVA HANDIHALER  Place 1 capsule (18 mcg total) into inhaler and inhale daily.           Objective:    BP 155/90 mmHg  Pulse 79  Temp(Src) 97.4 F (36.3 C)  Ht '5\' 4"'  (1.626 m)  Wt 136 lb (61.689 kg)  BMI 23.33 kg/m2  Wt Readings from Last 3 Encounters:   06/29/15 136 lb (61.689 kg)  12/29/14 139 lb (63.05 kg)  08/27/14 131 lb (59.421 kg)    Physical Exam  Constitutional: She is oriented to person, place, and time. She appears well-developed and well-nourished. No distress.  Eyes: Conjunctivae and EOM are normal. Pupils are equal, round, and reactive to light.  Neck: Neck supple. No thyromegaly present.  Cardiovascular: Normal rate and regular rhythm.   No murmur heard. Pulmonary/Chest: Effort normal. No respiratory distress. She has wheezes. She has no rales.  Musculoskeletal: Normal range of motion. She exhibits no edema or tenderness.  Lymphadenopathy:    She has no cervical adenopathy.  Neurological: She is alert and oriented to person, place, and time. Coordination normal.  Skin: Skin is warm and dry. No rash noted. She is not diaphoretic.  Psychiatric: She has a normal mood and affect. Her behavior is normal.  Vitals reviewed.   Results for orders placed or performed in visit on 12/29/14  CMP14+EGFR  Result Value Ref Range   Glucose 98 65 - 99 mg/dL   BUN 15 8 - 27 mg/dL   Creatinine, Ser 1.27 (H) 0.57 - 1.00 mg/dL   GFR calc non Af Amer 41 (L) >59 mL/min/1.73   GFR calc Af Amer 47 (L) >59 mL/min/1.73   BUN/Creatinine Ratio 12 11 - 26   Sodium 143 134 - 144 mmol/L   Potassium 3.3 (L) 3.5 - 5.2 mmol/L   Chloride 102 97 - 108 mmol/L   CO2 24 18 - 29 mmol/L   Calcium 8.6 (L) 8.7 - 10.3 mg/dL   Total Protein 5.8 (L) 6.0 - 8.5 g/dL   Albumin 3.5 3.5 - 4.8 g/dL   Globulin, Total 2.3 1.5 - 4.5 g/dL   Albumin/Globulin Ratio 1.5 1.1 - 2.5   Bilirubin Total 0.2 0.0 - 1.2 mg/dL   Alkaline Phosphatase 67 39 - 117 IU/L   AST 13 0 - 40 IU/L   ALT 6 0 - 32 IU/L  NMR, lipoprofile  Result Value Ref Range   LDL Particle Number 1087 (H) <1000 nmol/L   LDL-C 83 0 - 99 mg/dL   HDL Cholesterol by NMR 49 >39 mg/dL   Triglycerides by NMR 230 (H) 0 - 149 mg/dL   Cholesterol 178 100 - 199 mg/dL   HDL Particle Number 38.9 >=30.5 umol/L    Small LDL Particle Number 669 (H) <=527 nmol/L   LDL Size 20.7 >20.5 nm   LP-IR Score 44 <=45  Thyroid Panel With TSH  Result Value Ref Range   TSH 19.050 (H) 0.450 - 4.500 uIU/mL   T4, Total 5.6 4.5 - 12.0 ug/dL   T3 Uptake Ratio 27 24 - 39 %   Free Thyroxine Index 1.5 1.2 - 4.9      Assessment & Plan:   Problem List Items Addressed This Visit      Cardiovascular and  Mediastinum   Essential hypertension, benign - Primary    Patient has elevated blood pressure today but has not been taking her Norvasc. We'll restart her Norvasc and continue lisinopril hydrochlorothiazide combination. We will test labs today      Relevant Medications   lisinopril-hydrochlorothiazide (PRINZIDE,ZESTORETIC) 20-12.5 MG per tablet   amLODipine (NORVASC) 5 MG tablet   Other Relevant Orders   Lipid panel     Respiratory   COPD (chronic obstructive pulmonary disease)    Patient needed a refill for spree with but she continues to see pulmonology for management of her COPD. We will refill today and she has an appointment to go see them soon.      Relevant Medications   tiotropium (SPIRIVA HANDIHALER) 18 MCG inhalation capsule     Endocrine   Hypothyroidism    Patient denies any symptoms of hypothyroidism or hyperthyroidism and is happy with her medication dose. We will recheck a level today but continue her medication for now.      Relevant Medications   levothyroxine (SYNTHROID, LEVOTHROID) 125 MCG tablet   Other Relevant Orders   Thyroid Panel With TSH       Follow up plan: Return in about 3 months (around 09/29/2015), or if symptoms worsen or fail to improve.  Caryl Pina, MD [facility] 06/29/2015, 12:07 PM

## 2015-06-29 NOTE — Patient Instructions (Signed)
Hypertension Hypertension, commonly called high blood pressure, is when the force of blood pumping through your arteries is too strong. Your arteries are the blood vessels that carry blood from your heart throughout your body. A blood pressure reading consists of a higher number over a lower number, such as 110/72. The higher number (systolic) is the pressure inside your arteries when your heart pumps. The lower number (diastolic) is the pressure inside your arteries when your heart relaxes. Ideally you want your blood pressure below 120/80. Hypertension forces your heart to work harder to pump blood. Your arteries may become narrow or stiff. Having hypertension puts you at risk for heart disease, stroke, and other problems.  RISK FACTORS Some risk factors for high blood pressure are controllable. Others are not.  Risk factors you cannot control include:   Race. You may be at higher risk if you are African American.  Age. Risk increases with age.  Gender. Men are at higher risk than women before age 45 years. After age 65, women are at higher risk than men. Risk factors you can control include:  Not getting enough exercise or physical activity.  Being overweight.  Getting too much fat, sugar, calories, or salt in your diet.  Drinking too much alcohol. SIGNS AND SYMPTOMS Hypertension does not usually cause signs or symptoms. Extremely high blood pressure (hypertensive crisis) may cause headache, anxiety, shortness of breath, and nosebleed. DIAGNOSIS  To check if you have hypertension, your health care provider will measure your blood pressure while you are seated, with your arm held at the level of your heart. It should be measured at least twice using the same arm. Certain conditions can cause a difference in blood pressure between your right and left arms. A blood pressure reading that is higher than normal on one occasion does not mean that you need treatment. If one blood pressure reading  is high, ask your health care provider about having it checked again. TREATMENT  Treating high blood pressure includes making lifestyle changes and possibly taking medicine. Living a healthy lifestyle can help lower high blood pressure. You may need to change some of your habits. Lifestyle changes may include:  Following the DASH diet. This diet is high in fruits, vegetables, and whole grains. It is low in salt, red meat, and added sugars.  Getting at least 2 hours of brisk physical activity every week.  Losing weight if necessary.  Not smoking.  Limiting alcoholic beverages.  Learning ways to reduce stress. If lifestyle changes are not enough to get your blood pressure under control, your health care provider may prescribe medicine. You may need to take more than one. Work closely with your health care provider to understand the risks and benefits. HOME CARE INSTRUCTIONS  Have your blood pressure rechecked as directed by your health care provider.   Take medicines only as directed by your health care provider. Follow the directions carefully. Blood pressure medicines must be taken as prescribed. The medicine does not work as well when you skip doses. Skipping doses also puts you at risk for problems.   Do not smoke.   Monitor your blood pressure at home as directed by your health care provider. SEEK MEDICAL CARE IF:   You think you are having a reaction to medicines taken.  You have recurrent headaches or feel dizzy.  You have swelling in your ankles.  You have trouble with your vision. SEEK IMMEDIATE MEDICAL CARE IF:  You develop a severe headache or confusion.    You have unusual weakness, numbness, or feel faint.  You have severe chest or abdominal pain.  You vomit repeatedly.  You have trouble breathing. MAKE SURE YOU:   Understand these instructions.  Will watch your condition.  Will get help right away if you are not doing well or get worse. Document  Released: 10/28/2005 Document Revised: 03/14/2014 Document Reviewed: 08/20/2013 [facility] Patient Information 2015 [place], [place]. This information is not intended to replace advice given to you by your health care provider. Make sure you discuss any questions you have with your health care provider.  Hypothyroidism The thyroid is a large gland located in the lower front of your neck. The thyroid gland helps control metabolism. Metabolism is how your body handles food. It controls metabolism with the hormone thyroxine. When this gland is underactive (hypothyroid), it produces too little hormone.  CAUSES These include:   Absence or destruction of thyroid tissue.  Goiter due to iodine deficiency.  Goiter due to medications.  Congenital defects (since birth).  Problems with the pituitary. This causes a lack of TSH (thyroid stimulating hormone). This hormone tells the thyroid to turn out more hormone. SYMPTOMS  Lethargy (feeling as though you have no energy)  Cold intolerance  Weight gain (in spite of normal food intake)  Dry skin  Coarse hair  Menstrual irregularity (if severe, may lead to infertility)  Slowing of thought processes Cardiac problems are also caused by insufficient amounts of thyroid hormone. Hypothyroidism in the newborn is cretinism, and is an extreme form. It is important that this form be treated adequately and immediately or it will lead rapidly to retarded physical and mental development. DIAGNOSIS  To prove hypothyroidism, your caregiver may do blood tests and ultrasound tests. Sometimes the signs are hidden. It may be necessary for your caregiver to watch this illness with blood tests either before or after diagnosis and treatment. TREATMENT  Low levels of thyroid hormone are increased by using synthetic thyroid hormone. This is a safe, effective treatment. It usually takes about four weeks to gain the full effects of the medication. After you have the full  effect of the medication, it will generally take another four weeks for problems to leave. Your caregiver may start you on low doses. If you have had heart problems the dose may be gradually increased. It is generally not an emergency to get rapidly to normal. HOME CARE INSTRUCTIONS   Take your medications as your caregiver suggests. Let your caregiver know of any medications you are taking or start taking. Your caregiver will help you with dosage schedules.  As your condition improves, your dosage needs may increase. It will be necessary to have continuing blood tests as suggested by your caregiver.  Report all suspected medication side effects to your caregiver. SEEK MEDICAL CARE IF: Seek medical care if you develop:  Sweating.  Tremulousness (tremors).  Anxiety.  Rapid weight loss.  Heat intolerance.  Emotional swings.  Diarrhea.  Weakness. SEEK IMMEDIATE MEDICAL CARE IF:  You develop chest pain, an irregular heart beat (palpitations), or a rapid heart beat. MAKE SURE YOU:   Understand these instructions.  Will watch your condition.  Will get help right away if you are not doing well or get worse. Document Released: 10/28/2005 Document Revised: 01/20/2012 Document Reviewed: 06/17/2008 [facility] Patient Information 2015 [place], [place]. This information is not intended to replace advice given to you by your health care provider. Make sure you discuss any questions you have with your health care provider.

## 2015-06-29 NOTE — Assessment & Plan Note (Signed)
Patient has elevated blood pressure today but has not been taking her Norvasc. We'll restart her Norvasc and continue lisinopril hydrochlorothiazide combination. We will test labs today

## 2015-06-30 LAB — LIPID PANEL
CHOL/HDL RATIO: 4.5 ratio — AB (ref 0.0–4.4)
CHOLESTEROL TOTAL: 225 mg/dL — AB (ref 100–199)
HDL: 50 mg/dL (ref >39)
LDL Calculated: 115 mg/dL — ABNORMAL HIGH (ref 0–99)
TRIGLYCERIDES: 298 mg/dL — AB (ref 0–149)
VLDL Cholesterol Cal: 60 mg/dL — ABNORMAL HIGH (ref 5–40)

## 2015-06-30 LAB — THYROID PANEL WITH TSH
FREE THYROXINE INDEX: 0.1 — AB (ref 1.2–4.9)
T3 UPTAKE RATIO: 22 % — AB (ref 24–39)
T4, Total: 0.6 ug/dL — CL (ref 4.5–12.0)
TSH: 126.2 u[IU]/mL — ABNORMAL HIGH (ref 0.450–4.500)

## 2015-06-30 MED ORDER — ATORVASTATIN CALCIUM 80 MG PO TABS: 80.0000 mg | ORAL_TABLET | Freq: Every day | ORAL | Status: DC

## 2015-06-30 MED ORDER — LEVOTHYROXINE SODIUM 150 MCG PO TABS: 150.0000 ug | ORAL_TABLET | Freq: Every day | ORAL | Status: DC

## 2015-06-30 MED ORDER — LEVOTHYROXINE SODIUM 112 MCG PO TABS: 112.0000 ug | ORAL_TABLET | Freq: Every day | ORAL | Status: DC

## 2015-06-30 NOTE — Addendum Note (Signed)
Addended by: Caryl Pina on: 06/30/2015 05:30 PM   Modules accepted: Orders

## 2015-06-30 NOTE — Addendum Note (Signed)
Addended by: Caryl Pina on: 06/30/2015 05:27 PM   Modules accepted: Orders

## 2015-07-18 ENCOUNTER — Other Ambulatory Visit: Payer: Self-pay | Admitting: Family Medicine

## 2015-07-18 ENCOUNTER — Other Ambulatory Visit: Payer: Self-pay | Admitting: Nurse Practitioner

## 2015-08-10 DIAGNOSIS — J449 Chronic obstructive pulmonary disease, unspecified: Secondary | ICD-10-CM | POA: Diagnosis not present

## 2015-08-10 DIAGNOSIS — J309 Allergic rhinitis, unspecified: Secondary | ICD-10-CM | POA: Diagnosis not present

## 2015-08-10 DIAGNOSIS — Z23 Encounter for immunization: Secondary | ICD-10-CM | POA: Diagnosis not present

## 2015-08-10 DIAGNOSIS — R0600 Dyspnea, unspecified: Secondary | ICD-10-CM | POA: Diagnosis not present

## 2015-08-29 ENCOUNTER — Encounter: Payer: Self-pay | Admitting: Family Medicine

## 2015-09-28 ENCOUNTER — Ambulatory Visit (INDEPENDENT_AMBULATORY_CARE_PROVIDER_SITE_OTHER): Payer: Medicare Other | Admitting: Family Medicine

## 2015-09-28 ENCOUNTER — Encounter: Payer: Self-pay | Admitting: Family Medicine

## 2015-09-28 VITALS — BP 131/90 | HR 94 | Temp 98.0°F | Ht 64.0 in | Wt 127.4 lb

## 2015-09-28 DIAGNOSIS — I10 Essential (primary) hypertension: Secondary | ICD-10-CM | POA: Diagnosis not present

## 2015-09-28 DIAGNOSIS — K5900 Constipation, unspecified: Secondary | ICD-10-CM | POA: Diagnosis not present

## 2015-09-28 DIAGNOSIS — K640 First degree hemorrhoids: Secondary | ICD-10-CM | POA: Diagnosis not present

## 2015-09-28 MED ORDER — HYDROCORTISONE ACETATE 25 MG RE SUPP: 25.0000 mg | Freq: Two times a day (BID) | RECTAL | Status: DC

## 2015-09-28 MED ORDER — POLYETHYLENE GLYCOL 3350 17 GM/SCOOP PO POWD: 17.0000 g | Freq: Two times a day (BID) | ORAL | Status: DC | PRN

## 2015-09-28 NOTE — Progress Notes (Signed)
BP 131/90 mmHg  Pulse 94  Temp(Src) 98 F (36.7 C) (Oral)  Ht 5\' 4"  (1.626 m)  Wt 127 lb 6.4 oz (57.788 kg)  BMI 21.86 kg/m2   Subjective:    Patient ID: Meghan Anderson, female    DOB: Aug 10, 1936, 79 y.o.   MRN: [id number]  HPI: Meghan Anderson is a 79 y.o. female presenting on 09/28/2015 for Hypertension; Constipation; and Weight Loss   HPI Hypertension Patient presents for hypertension recheck. Her blood pressure initially was elevated 150s but after a recheck here in office it came back as 131/90. She is taking her medication as prescribed and took it this morning. She is also having constipation and that may be raising her blood pressure up slightly. We will monitor for now and not change any medications.  Constipation Patient has been having constipation for the past 6 months. She has been using Dulcolax, MiraLAX, milk of magnesia, enemas. These medications help her have bowel movements with then she goes right back to being constipated after she is not using them anymore. She admits that she has never had a colonoscopy before. She also admits that she is having weight loss of about 12 pounds in the past year. She is still able to pass gas and still has bowel movements on a very hard and with the medications at least every other day. She also complains of blood but mostly just when she wipes. She has to strain a lot with bowel movements.  Relevant past medical, surgical, family and social history reviewed and updated as indicated. Interim medical history since our last visit reviewed. Allergies and medications reviewed and updated.  Review of Systems  Constitutional: Negative for fever and chills.  HENT: Negative for congestion, ear discharge and ear pain.   Eyes: Negative for redness and visual disturbance.  Respiratory: Negative for chest tightness and shortness of breath.   Cardiovascular: Negative for chest pain and leg swelling.  Gastrointestinal: Positive for  constipation, blood in stool and abdominal distention. Negative for nausea, vomiting, abdominal pain and diarrhea.  Endocrine: Negative for cold intolerance, heat intolerance, polydipsia and polyuria.  Genitourinary: Negative for dysuria and difficulty urinating.  Musculoskeletal: Negative for back pain and gait problem.  Skin: Negative for rash.  Neurological: Negative for dizziness, light-headedness and headaches.  Psychiatric/Behavioral: Negative for behavioral problems and agitation.  All other systems reviewed and are negative.   Per HPI unless specifically indicated above     Medication List       This list is accurate as of: 09/28/15  2:57 PM.  Always use your most recent med list.               albuterol (2.5 MG/3ML) 0.083% nebulizer solution  Commonly known as:  PROVENTIL  NEBULIZE 1 VIAL EVERY 4 HOURS AS NEEDED     amLODipine 5 MG tablet  Commonly known as:  NORVASC  Take 1 tablet (5 mg total) by mouth daily.     atorvastatin 80 MG tablet  Commonly known as:  LIPITOR  Take 1 tablet (80 mg total) by mouth daily.     BREO ELLIPTA 100-25 MCG/INH Aepb  Generic drug:  Fluticasone Furoate-Vilanterol  Inhale 1 puff into the lungs.     BREO ELLIPTA 100-25 MCG/INH Aepb  Generic drug:  Fluticasone Furoate-Vilanterol  USE 1 PUFF ONCE DAILY     fluticasone 50 MCG/ACT nasal spray  Commonly known as:  FLONASE  Place 2 sprays into both nostrils daily as needed.  levothyroxine 150 MCG tablet  Commonly known as:  SYNTHROID, LEVOTHROID  Take 1 tablet (150 mcg total) by mouth daily.     lisinopril-hydrochlorothiazide 20-12.5 MG tablet  Commonly known as:  PRINZIDE,ZESTORETIC  Take 2 tablets by mouth daily.     omeprazole 40 MG capsule  Commonly known as:  PRILOSEC  TAKE ONE (1) CAPSULE EACH DAY     polyethylene glycol powder powder  Commonly known as:  GLYCOLAX/MIRALAX  Take 17 g by mouth 2 (two) times daily as needed.     tiotropium 18 MCG inhalation capsule    Commonly known as:  SPIRIVA HANDIHALER  Place 1 capsule (18 mcg total) into inhaler and inhale daily.           Objective:    BP 131/90 mmHg  Pulse 94  Temp(Src) 98 F (36.7 C) (Oral)  Ht 5\' 4"  (1.626 m)  Wt 127 lb 6.4 oz (57.788 kg)  BMI 21.86 kg/m2  Wt Readings from Last 3 Encounters:  09/28/15 127 lb 6.4 oz (57.788 kg)  06/29/15 136 lb (61.689 kg)  12/29/14 139 lb (63.05 kg)    Physical Exam  Constitutional: She is oriented to person, place, and time. She appears well-developed and well-nourished. No distress.  Eyes: Conjunctivae and EOM are normal. Pupils are equal, round, and reactive to light.  Neck: Neck supple. No thyromegaly present.  Cardiovascular: Normal rate, regular rhythm, normal heart sounds and intact distal pulses.   No murmur heard. Pulmonary/Chest: Effort normal. No respiratory distress. She has wheezes (she has a slight wheeze).  Abdominal: Soft. Bowel sounds are normal. She exhibits no distension and no mass. There is no tenderness. There is no rebound and no guarding.  Musculoskeletal: Normal range of motion. She exhibits no edema or tenderness.  Lymphadenopathy:    She has no cervical adenopathy.  Neurological: She is alert and oriented to person, place, and time. Coordination normal.  Skin: Skin is warm and dry. No rash noted. She is not diaphoretic.  Psychiatric: She has a normal mood and affect. Her behavior is normal.  Nursing note and vitals reviewed.   Results for orders placed or performed in visit on 06/29/15  Thyroid Panel With TSH  Result Value Ref Range   TSH 126.200 (H) 0.450 - 4.500 uIU/mL   T4, Total 0.6 (LL) 4.5 - 12.0 ug/dL   T3 Uptake Ratio 22 (L) 24 - 39 %   Free Thyroxine Index 0.1 (L) 1.2 - 4.9  Lipid panel  Result Value Ref Range   Cholesterol, Total 225 (H) 100 - 199 mg/dL   Triglycerides 298 (H) 0 - 149 mg/dL   HDL 50 >39 mg/dL   VLDL Cholesterol Cal 60 (H) 5 - 40 mg/dL   LDL Calculated 115 (H) 0 - 99 mg/dL    Chol/HDL Ratio 4.5 (H) 0.0 - 4.4 ratio units      Assessment & Plan:   Problem List Items Addressed This Visit      Cardiovascular and Mediastinum   Essential hypertension, benign - Primary     Patient's initial check was elevated but recheck was normal. Will monitor and continue current medications.       Other Visit Diagnoses    Constipation, unspecified constipation type         patient has never had a colonoscopy, concern with 6 months contipation and weightt loss, will send for colonoscoy ASAP    Relevant Medications    polyethylene glycol powder (GLYCOLAX/MIRALAX) powder    Other Relevant  Orders    Ambulatory referral to Gastroenterology    First degree hemorrhoids        Relevant Medications    hydrocortisone (ANUSOL-HC) 25 MG suppository        Follow up plan: Return in about 3 months (around 12/29/2015), or if symptoms worsen or fail to improve, for HTN recheck.  Counseling provided for all of the vaccine components Orders Placed This Encounter  Procedures  . Ambulatory referral to Gastroenterology    Caryl Pina, MD [facility] 09/28/2015, 2:57 PM

## 2015-09-28 NOTE — Assessment & Plan Note (Signed)
Patient's initial check was elevated but recheck was normal. Will monitor and continue current medications.

## 2015-09-29 ENCOUNTER — Ambulatory Visit: Payer: Medicare Other | Admitting: Family Medicine

## 2015-10-03 ENCOUNTER — Encounter: Payer: Self-pay | Admitting: Internal Medicine

## 2015-10-13 DIAGNOSIS — J189 Pneumonia, unspecified organism: Secondary | ICD-10-CM | POA: Diagnosis not present

## 2015-10-13 DIAGNOSIS — R05 Cough: Secondary | ICD-10-CM | POA: Diagnosis not present

## 2015-10-13 DIAGNOSIS — R0602 Shortness of breath: Secondary | ICD-10-CM | POA: Diagnosis not present

## 2015-10-23 ENCOUNTER — Ambulatory Visit (INDEPENDENT_AMBULATORY_CARE_PROVIDER_SITE_OTHER): Payer: Medicare Other | Admitting: Nurse Practitioner

## 2015-10-23 ENCOUNTER — Other Ambulatory Visit: Payer: Self-pay

## 2015-10-23 ENCOUNTER — Encounter: Payer: Self-pay | Admitting: Nurse Practitioner

## 2015-10-23 VITALS — BP 169/84 | HR 81 | Temp 97.4°F | Ht 64.0 in | Wt 127.8 lb

## 2015-10-23 DIAGNOSIS — R634 Abnormal weight loss: Secondary | ICD-10-CM | POA: Diagnosis not present

## 2015-10-23 DIAGNOSIS — K625 Hemorrhage of anus and rectum: Secondary | ICD-10-CM

## 2015-10-23 DIAGNOSIS — K5900 Constipation, unspecified: Secondary | ICD-10-CM

## 2015-10-23 MED ORDER — PEG 3350-KCL-NA BICARB-NACL 420 G PO SOLR: 4000.0000 mL | Freq: Once | ORAL | Status: DC

## 2015-10-23 NOTE — Assessment & Plan Note (Addendum)
79 year old female with new onset constipation approximately 6 months ago. She is never had an issue with constipation before. Her symptoms are getting progressively worse and is now requiring laxatives and stool softeners or to have a bowel movement. This is concerning given her other symptoms as noted below and that she is never had a colonoscopy at age 66. This point we'll proceed with a diagnostic colonoscopy to further evaluate her symptoms. Return for follow-up in 6 weeks.  Proceed with TCS with Dr. [name] in near future: the risks, benefits, and alternatives have been discussed with the patient in detail. The patient states understanding and desires to proceed.  She is not on any anticoagulants, chronic pain medications, anxiolytics, or antidepressants. Denies alcohol and drug use. Conscious sedation should be adequate for her procedure.

## 2015-10-23 NOTE — Patient Instructions (Signed)
1. We will schedule your colonoscopy for you.  2. Further recommendations to be based on the results of your colonoscopy. 3. Return for follow-up in 6 weeks so we can recheck on your symptoms and make any plans, medication changes needed based on colonoscopy results.

## 2015-10-23 NOTE — Progress Notes (Signed)
CC'D TO PCP °

## 2015-10-23 NOTE — Progress Notes (Signed)
Primary Care Physician:  [name], MD Primary Gastroenterologist:  Dr. [name]  Chief Complaint  Patient presents with  . Constipation    HPI:   79 year old female presents on referral from primary care for 6 months new-onset and worsening constipation, objective weight loss. PCP notes reviewed. Per the primary care provider, constipation has been ongoing for 6 months and is required escalating doses of laxatives to have any productive bowel movement. Also notes 12 pound weight loss in the past 8-10 months. Patient is never had a colonoscopy.  Today she confirms the above history. She has not had constipation before. Is also having hematochezia mostly when wiping though sometimes on the stool. Will have abdominal pain and bloating when she becomes constipated. Denies fever, chills. Has never had a colonoscopy before. Denies chest pain, dyspnea, dizziness, lightheadedness, syncope, near syncope. Denies any other upper or lower GI symptoms.  Past Medical History  Diagnosis Date  . Hypertension   . Hyperlipidemia   . Chronic bronchitis ([place])   . Thyroid disease   . GERD (gastroesophageal reflux disease)   . Arthritis   . Murmur, heart   . Cataract     Past Surgical History  Procedure Laterality Date  . Left ear    . Abdominal hysterectomy      complete    Current Outpatient Prescriptions  Medication Sig Dispense Refill  . albuterol (PROVENTIL) (2.5 MG/3ML) 0.083% nebulizer solution NEBULIZE 1 VIAL EVERY 4 HOURS AS NEEDED 150 mL 2  . amLODipine (NORVASC) 5 MG tablet Take 1 tablet (5 mg total) by mouth daily. 30 tablet 5  . atorvastatin (LIPITOR) 80 MG tablet Take 1 tablet (80 mg total) by mouth daily. 90 tablet 3  . fluticasone (FLONASE) 50 MCG/ACT nasal spray Place 2 sprays into both nostrils daily as needed. 16 g 1  . Fluticasone Furoate-Vilanterol (BREO ELLIPTA) 100-25 MCG/INH AEPB Inhale 1 puff into the lungs.    . Fluticasone Furoate-Vilanterol (BREO ELLIPTA)  100-25 MCG/INH AEPB USE 1 PUFF ONCE DAILY    . HYDROcodone-homatropine (HYCODAN) 5-1.5 MG/5ML syrup     . hydrocortisone (ANUSOL-HC) 25 MG suppository Place 1 suppository (25 mg total) rectally 2 (two) times daily. 12 suppository 0  . levothyroxine (SYNTHROID, LEVOTHROID) 150 MCG tablet Take 1 tablet (150 mcg total) by mouth daily. 90 tablet 3  . lisinopril-hydrochlorothiazide (PRINZIDE,ZESTORETIC) 20-12.5 MG per tablet Take 2 tablets by mouth daily. 60 tablet 3  . omeprazole (PRILOSEC) 40 MG capsule TAKE ONE (1) CAPSULE EACH DAY 30 capsule 4  . polyethylene glycol powder (GLYCOLAX/MIRALAX) powder Take 17 g by mouth 2 (two) times daily as needed. 3350 g 1  . tiotropium (SPIRIVA HANDIHALER) 18 MCG inhalation capsule Place 1 capsule (18 mcg total) into inhaler and inhale daily. 90 capsule 3   No current facility-administered medications for this visit.    Allergies as of 10/23/2015 - Review Complete 10/23/2015  Allergen Reaction Noted  . Prednisone Itching and Rash 08/27/2013  . Quinolones Rash 07/29/2014    Family History  Problem Relation Age of Onset  . Kidney disease Mother     nephrectomy x 1  . COPD Brother   . Emphysema Brother   . Asthma Brother   . Cancer Brother 12    brain    Social History   Social History  . Marital Status: Widowed    Spouse Name: N/A  . Number of Children: N/A  . Years of Education: N/A   Occupational History  . Not  on file.   Social History Main Topics  . Smoking status: Former Smoker    Quit date: 11/11/2010  . Smokeless tobacco: Not on file  . Alcohol Use: No  . Drug Use: No  . Sexual Activity: Not on file   Other Topics Concern  . Not on file   Social History Narrative    Review of Systems: General: Negative for anorexia, fever, chills, fatigue, weakness. Eyes: Negative for vision changes.  ENT: Negative for hoarseness, nasal congestion. CV: Negative for chest pain, angina, palpitations, peripheral edema.  Respiratory:  Negative for dyspnea at rest, cough, sputum, wheezing.  GI: See history of present illness. Derm: Negative for rash or itching.  Endo: Negative for unusual weight change.  Heme: Negative for bruising or bleeding. Allergy: Negative for rash or hives.    Physical Exam: BP 169/84 mmHg  Pulse 81  Temp(Src) 97.4 F (36.3 C) (Oral)  Ht 5\' 4"  (1.626 m)  Wt 127 lb 12.8 oz (57.97 kg)  BMI 21.93 kg/m2 General:   Alert and oriented. Pleasant and cooperative. Well-nourished and well-developed.  Head:  Normocephalic and atraumatic. Eyes:  Without icterus, sclera clear and conjunctiva pink.  Ears:  Slightly hard of hearing. Cardiovascular:  S1, S2 present without murmurs appreciated. Extremities without clubbing or edema. Respiratory:  Clear to auscultation bilaterally. No wheezes, rales, or rhonchi. No distress.  Gastrointestinal:  +BS, soft, non-tender and non-distended. No HSM noted. No guarding or rebound. No masses appreciated.  Rectal:  Deferred  Neurologic:  Alert and oriented x4;  grossly normal neurologically. Psych:  Alert and cooperative. Normal mood and affect.    10/23/2015 9:28 AM

## 2015-10-23 NOTE — Assessment & Plan Note (Signed)
She has started having rectal bleeding including toilet tissue hematochezia as well as blood in the stool with her bowel movements. This started at same time as her constipation approximately 6 months ago. At this point we'll proceed with diagnostic colonoscopy as noted below.

## 2015-10-23 NOTE — Assessment & Plan Note (Signed)
In addition to rectal bleeding and new onset constipation as described above, the patient is also had a subjective weight loss of approximately 12 pounds in the past several months. Her constellation of symptoms are quite concerning given that she is never had a colonoscopy. We'll proceed with diagnostic colonoscopy as noted above. Return for follow-up in 6 weeks.

## 2015-11-01 ENCOUNTER — Ambulatory Visit (HOSPITAL_COMMUNITY)
Admission: RE | Admit: 2015-11-01 | Discharge: 2015-11-01 | Disposition: A | Discharge status: Home / Self Care | Payer: Medicare Other | Source: Ambulatory Visit | Attending: Internal Medicine | Admitting: Internal Medicine

## 2015-11-01 ENCOUNTER — Telehealth: Payer: Self-pay

## 2015-11-01 ENCOUNTER — Other Ambulatory Visit: Payer: Self-pay

## 2015-11-01 ENCOUNTER — Encounter (HOSPITAL_COMMUNITY): Payer: Self-pay | Admitting: *Deleted

## 2015-11-01 ENCOUNTER — Encounter (HOSPITAL_COMMUNITY)
Admission: RE | Disposition: A | Discharge status: Home / Self Care | Payer: Self-pay | Source: Ambulatory Visit | Attending: Internal Medicine

## 2015-11-01 DIAGNOSIS — E785 Hyperlipidemia, unspecified: Secondary | ICD-10-CM | POA: Diagnosis not present

## 2015-11-01 DIAGNOSIS — IMO0002 Reserved for concepts with insufficient information to code with codable children: Secondary | ICD-10-CM

## 2015-11-01 DIAGNOSIS — Z90710 Acquired absence of both cervix and uterus: Secondary | ICD-10-CM | POA: Insufficient documentation

## 2015-11-01 DIAGNOSIS — R109 Unspecified abdominal pain: Secondary | ICD-10-CM | POA: Diagnosis not present

## 2015-11-01 DIAGNOSIS — D490 Neoplasm of unspecified behavior of digestive system: Secondary | ICD-10-CM | POA: Diagnosis not present

## 2015-11-01 DIAGNOSIS — R634 Abnormal weight loss: Secondary | ICD-10-CM

## 2015-11-01 DIAGNOSIS — K5900 Constipation, unspecified: Secondary | ICD-10-CM

## 2015-11-01 DIAGNOSIS — K219 Gastro-esophageal reflux disease without esophagitis: Secondary | ICD-10-CM | POA: Insufficient documentation

## 2015-11-01 DIAGNOSIS — C20 Malignant neoplasm of rectum: Secondary | ICD-10-CM | POA: Insufficient documentation

## 2015-11-01 DIAGNOSIS — Z87891 Personal history of nicotine dependence: Secondary | ICD-10-CM | POA: Diagnosis not present

## 2015-11-01 DIAGNOSIS — K625 Hemorrhage of anus and rectum: Secondary | ICD-10-CM | POA: Diagnosis not present

## 2015-11-01 DIAGNOSIS — K6289 Other specified diseases of anus and rectum: Secondary | ICD-10-CM

## 2015-11-01 DIAGNOSIS — R011 Cardiac murmur, unspecified: Secondary | ICD-10-CM

## 2015-11-01 DIAGNOSIS — E079 Disorder of thyroid, unspecified: Secondary | ICD-10-CM | POA: Insufficient documentation

## 2015-11-01 DIAGNOSIS — I10 Essential (primary) hypertension: Secondary | ICD-10-CM | POA: Insufficient documentation

## 2015-11-01 DIAGNOSIS — D128 Benign neoplasm of rectum: Secondary | ICD-10-CM | POA: Diagnosis not present

## 2015-11-01 DIAGNOSIS — J42 Unspecified chronic bronchitis: Secondary | ICD-10-CM | POA: Diagnosis not present

## 2015-11-01 DIAGNOSIS — R198 Other specified symptoms and signs involving the digestive system and abdomen: Secondary | ICD-10-CM

## 2015-11-01 DIAGNOSIS — Z825 Family history of asthma and other chronic lower respiratory diseases: Secondary | ICD-10-CM | POA: Insufficient documentation

## 2015-11-01 DIAGNOSIS — I700 Atherosclerosis of aorta: Secondary | ICD-10-CM

## 2015-11-01 DIAGNOSIS — Z7951 Long term (current) use of inhaled steroids: Secondary | ICD-10-CM | POA: Diagnosis not present

## 2015-11-01 DIAGNOSIS — Z841 Family history of disorders of kidney and ureter: Secondary | ICD-10-CM | POA: Diagnosis not present

## 2015-11-01 DIAGNOSIS — M1990 Unspecified osteoarthritis, unspecified site: Secondary | ICD-10-CM | POA: Diagnosis not present

## 2015-11-01 DIAGNOSIS — Z888 Allergy status to other drugs, medicaments and biological substances status: Secondary | ICD-10-CM | POA: Diagnosis not present

## 2015-11-01 DIAGNOSIS — R229 Localized swelling, mass and lump, unspecified: Secondary | ICD-10-CM

## 2015-11-01 DIAGNOSIS — IMO0001 Reserved for inherently not codable concepts without codable children: Secondary | ICD-10-CM

## 2015-11-01 DIAGNOSIS — Z79899 Other long term (current) drug therapy: Secondary | ICD-10-CM | POA: Insufficient documentation

## 2015-11-01 DIAGNOSIS — Z808 Family history of malignant neoplasm of other organs or systems: Secondary | ICD-10-CM | POA: Insufficient documentation

## 2015-11-01 DIAGNOSIS — Z85038 Personal history of other malignant neoplasm of large intestine: Secondary | ICD-10-CM

## 2015-11-01 HISTORY — PX: COLONOSCOPY: SHX5424

## 2015-11-01 HISTORY — DX: Hypothyroidism, unspecified: E03.9

## 2015-11-01 LAB — CBC WITH DIFFERENTIAL/PLATELET
BASOS PCT: 1 %
Basophils Absolute: 0 10*3/uL (ref 0.0–0.1)
EOS ABS: 0.1 10*3/uL (ref 0.0–0.7)
Eosinophils Relative: 1 %
HCT: 31.3 % — ABNORMAL LOW (ref 36.0–46.0)
HEMOGLOBIN: 9.9 g/dL — AB (ref 12.0–15.0)
Lymphocytes Relative: 16 %
Lymphs Abs: 0.7 10*3/uL (ref 0.7–4.0)
MCH: 29.5 pg (ref 26.0–34.0)
MCHC: 31.6 g/dL (ref 30.0–36.0)
MCV: 93.2 fL (ref 78.0–100.0)
MONOS PCT: 6 %
Monocytes Absolute: 0.3 10*3/uL (ref 0.1–1.0)
NEUTROS PCT: 76 %
Neutro Abs: 3.3 10*3/uL (ref 1.7–7.7)
Platelets: 225 10*3/uL (ref 150–400)
RBC: 3.36 MIL/uL — AB (ref 3.87–5.11)
RDW: 14.7 % (ref 11.5–15.5)
WBC: 4.4 10*3/uL (ref 4.0–10.5)

## 2015-11-01 LAB — COMPREHENSIVE METABOLIC PANEL
ALBUMIN: 2.7 g/dL — AB (ref 3.5–5.0)
ALK PHOS: 53 U/L (ref 38–126)
ALT: 8 U/L — ABNORMAL LOW (ref 14–54)
ANION GAP: 7 (ref 5–15)
AST: 13 U/L — ABNORMAL LOW (ref 15–41)
BUN: 16 mg/dL (ref 6–20)
CALCIUM: 7.8 mg/dL — AB (ref 8.9–10.3)
CO2: 25 mmol/L (ref 22–32)
Chloride: 108 mmol/L (ref 101–111)
Creatinine, Ser: 1.19 mg/dL — ABNORMAL HIGH (ref 0.44–1.00)
GFR calc Af Amer: 50 mL/min — ABNORMAL LOW (ref >60)
GFR calc non Af Amer: 43 mL/min — ABNORMAL LOW (ref >60)
GLUCOSE: 88 mg/dL (ref 65–99)
POTASSIUM: 3 mmol/L — AB (ref 3.5–5.1)
SODIUM: 140 mmol/L (ref 135–145)
Total Bilirubin: 0.4 mg/dL (ref 0.3–1.2)
Total Protein: 4.9 g/dL — ABNORMAL LOW (ref 6.5–8.1)

## 2015-11-01 SURGERY — COLONOSCOPY
Anesthesia: Moderate Sedation

## 2015-11-01 MED ORDER — MIDAZOLAM HCL 5 MG/5ML IJ SOLN
INTRAMUSCULAR | Status: AC
  Filled 2015-11-01: qty 10

## 2015-11-01 MED ORDER — MEPERIDINE HCL 100 MG/ML IJ SOLN
INTRAMUSCULAR | Status: DC | PRN
  Administered 2015-11-01 (×2): 25 mg via INTRAVENOUS

## 2015-11-01 MED ORDER — SODIUM CHLORIDE 0.9 % IV SOLN
INTRAVENOUS | Status: DC
  Administered 2015-11-01: 1000 mL via INTRAVENOUS

## 2015-11-01 MED ORDER — MEPERIDINE HCL 100 MG/ML IJ SOLN
INTRAMUSCULAR | Status: AC
  Filled 2015-11-01: qty 2

## 2015-11-01 MED ORDER — MIDAZOLAM HCL 5 MG/5ML IJ SOLN
INTRAMUSCULAR | Status: DC | PRN
  Administered 2015-11-01 (×4): 1 mg via INTRAVENOUS

## 2015-11-01 MED ORDER — ONDANSETRON HCL 4 MG/2ML IJ SOLN
INTRAMUSCULAR | Status: AC
  Filled 2015-11-01: qty 2

## 2015-11-01 MED ORDER — STERILE WATER FOR IRRIGATION IR SOLN
Status: DC | PRN
  Administered 2015-11-01: 12:00:00

## 2015-11-01 MED ORDER — ONDANSETRON HCL 4 MG/2ML IJ SOLN
INTRAMUSCULAR | Status: DC | PRN
  Administered 2015-11-01: 4 mg via INTRAVENOUS

## 2015-11-01 NOTE — Telephone Encounter (Signed)
Called and spoke with husband. Pt is aware of Ct scan of ABD/pelvis on 11/07/2015.  Insurance will not allow me to approve CT Chest the RMR has ordered.   Routing to RMR for further advise and orders for pt.

## 2015-11-01 NOTE — Discharge Instructions (Signed)
Standard sigmoidoscopy discharge orders given  CT scan of the pelvis abdomen and chest with and without contrast-rectal mass    ********** Office notified*************************  Chem-20, CBC and CEA today  ***** *drawn*******  Further recommendations to follow pending review of pathology report     Notify son Jakyla Mcpike  Flexible Sigmoidoscopy, Care After Refer to this sheet in the next few weeks. These instructions provide you with information on caring for yourself after your procedure. Your health care provider may also give you more specific instructions. Your treatment has been planned according to current medical practices, but problems sometimes occur. Call your health care provider if you have any problems or questions after your procedure. WHAT TO EXPECT AFTER THE PROCEDURE After your procedure, it is typical to have the following:   Abdominal cramps.  Bloating.  A small amount of rectal bleeding if you had a biopsy. HOME CARE INSTRUCTIONS  Only take over-the-counter or prescription medicines for pain, fever, or discomfort as directed by your health care provider.  Resume your normal diet and activities as directed by your health care provider. SEEK MEDICAL CARE IF:  You have abdominal pain or cramping that lasts longer than 1 hour after the procedure.  You continue to have small amounts of rectal bleeding after 24 hours.  You have nausea or vomiting.  You feel weak or dizzy. SEEK IMMEDIATE MEDICAL CARE IF:   You have a fever.  You pass large blood clots or see a large amount of blood in the toilet after having a bowel movement. This may also occur 10-14 days after the procedure. It is more likely if you had a biopsy.  You develop abdominal pain that is not relieved with medicine or your abdominal pain gets worse.  You have nausea or vomiting for more than 24 hours after the procedure.   This information is not intended to replace advice given to you by your  health care provider. Make sure you discuss any questions you have with your health care provider.   Document Released: 11/02/2013 Document Reviewed: 11/02/2013 Elsevier Interactive Patient Education [employer].

## 2015-11-01 NOTE — H&P (View-Only) (Signed)
Primary Care Physician:  [name], MD Primary Gastroenterologist:  Dr. [name]  Chief Complaint  Patient presents with  . Constipation    HPI:   79 year old female presents on referral from primary care for 6 months new-onset and worsening constipation, objective weight loss. PCP notes reviewed. Per the primary care provider, constipation has been ongoing for 6 months and is required escalating doses of laxatives to have any productive bowel movement. Also notes 12 pound weight loss in the past 8-10 months. Patient is never had a colonoscopy.  Today she confirms the above history. She has not had constipation before. Is also having hematochezia mostly when wiping though sometimes on the stool. Will have abdominal pain and bloating when she becomes constipated. Denies fever, chills. Has never had a colonoscopy before. Denies chest pain, dyspnea, dizziness, lightheadedness, syncope, near syncope. Denies any other upper or lower GI symptoms.  Past Medical History  Diagnosis Date  . Hypertension   . Hyperlipidemia   . Chronic bronchitis ([place])   . Thyroid disease   . GERD (gastroesophageal reflux disease)   . Arthritis   . Murmur, heart   . Cataract     Past Surgical History  Procedure Laterality Date  . Left ear    . Abdominal hysterectomy      complete    Current Outpatient Prescriptions  Medication Sig Dispense Refill  . albuterol (PROVENTIL) (2.5 MG/3ML) 0.083% nebulizer solution NEBULIZE 1 VIAL EVERY 4 HOURS AS NEEDED 150 mL 2  . amLODipine (NORVASC) 5 MG tablet Take 1 tablet (5 mg total) by mouth daily. 30 tablet 5  . atorvastatin (LIPITOR) 80 MG tablet Take 1 tablet (80 mg total) by mouth daily. 90 tablet 3  . fluticasone (FLONASE) 50 MCG/ACT nasal spray Place 2 sprays into both nostrils daily as needed. 16 g 1  . Fluticasone Furoate-Vilanterol (BREO ELLIPTA) 100-25 MCG/INH AEPB Inhale 1 puff into the lungs.    . Fluticasone Furoate-Vilanterol (BREO ELLIPTA)  100-25 MCG/INH AEPB USE 1 PUFF ONCE DAILY    . HYDROcodone-homatropine (HYCODAN) 5-1.5 MG/5ML syrup     . hydrocortisone (ANUSOL-HC) 25 MG suppository Place 1 suppository (25 mg total) rectally 2 (two) times daily. 12 suppository 0  . levothyroxine (SYNTHROID, LEVOTHROID) 150 MCG tablet Take 1 tablet (150 mcg total) by mouth daily. 90 tablet 3  . lisinopril-hydrochlorothiazide (PRINZIDE,ZESTORETIC) 20-12.5 MG per tablet Take 2 tablets by mouth daily. 60 tablet 3  . omeprazole (PRILOSEC) 40 MG capsule TAKE ONE (1) CAPSULE EACH DAY 30 capsule 4  . polyethylene glycol powder (GLYCOLAX/MIRALAX) powder Take 17 g by mouth 2 (two) times daily as needed. 3350 g 1  . tiotropium (SPIRIVA HANDIHALER) 18 MCG inhalation capsule Place 1 capsule (18 mcg total) into inhaler and inhale daily. 90 capsule 3   No current facility-administered medications for this visit.    Allergies as of 10/23/2015 - Review Complete 10/23/2015  Allergen Reaction Noted  . Prednisone Itching and Rash 08/27/2013  . Quinolones Rash 07/29/2014    Family History  Problem Relation Age of Onset  . Kidney disease Mother     nephrectomy x 1  . COPD Brother   . Emphysema Brother   . Asthma Brother   . Cancer Brother 5    brain    Social History   Social History  . Marital Status: Widowed    Spouse Name: N/A  . Number of Children: N/A  . Years of Education: N/A   Occupational History  . Not  on file.   Social History Main Topics  . Smoking status: Former Smoker    Quit date: 11/11/2010  . Smokeless tobacco: Not on file  . Alcohol Use: No  . Drug Use: No  . Sexual Activity: Not on file   Other Topics Concern  . Not on file   Social History Narrative    Review of Systems: General: Negative for anorexia, fever, chills, fatigue, weakness. Eyes: Negative for vision changes.  ENT: Negative for hoarseness, nasal congestion. CV: Negative for chest pain, angina, palpitations, peripheral edema.  Respiratory:  Negative for dyspnea at rest, cough, sputum, wheezing.  GI: See history of present illness. Derm: Negative for rash or itching.  Endo: Negative for unusual weight change.  Heme: Negative for bruising or bleeding. Allergy: Negative for rash or hives.    Physical Exam: BP 169/84 mmHg  Pulse 81  Temp(Src) 97.4 F (36.3 C) (Oral)  Ht 5\' 4"  (1.626 m)  Wt 127 lb 12.8 oz (57.97 kg)  BMI 21.93 kg/m2 General:   Alert and oriented. Pleasant and cooperative. Well-nourished and well-developed.  Head:  Normocephalic and atraumatic. Eyes:  Without icterus, sclera clear and conjunctiva pink.  Ears:  Slightly hard of hearing. Cardiovascular:  S1, S2 present without murmurs appreciated. Extremities without clubbing or edema. Respiratory:  Clear to auscultation bilaterally. No wheezes, rales, or rhonchi. No distress.  Gastrointestinal:  +BS, soft, non-tender and non-distended. No HSM noted. No guarding or rebound. No masses appreciated.  Rectal:  Deferred  Neurologic:  Alert and oriented x4;  grossly normal neurologically. Psych:  Alert and cooperative. Normal mood and affect.    10/23/2015 9:28 AM

## 2015-11-01 NOTE — Interval H&P Note (Signed)
History and Physical Interval Note:  11/01/2015 12:07 PM  Meghan Anderson  has presented today for surgery, with the diagnosis of weight loss, constipation, rectal bleeding  The various methods of treatment have been discussed with the patient and family. After consideration of risks, benefits and other options for treatment, the patient has consented to  Procedure(s) with comments: COLONOSCOPY (N/A) - 1300 - moved to 12:00 - office to notify pt as a surgical intervention .  The patient's history has been reviewed, patient examined, no change in status, stable for surgery.  I have reviewed the patient's chart and labs.  Questions were answered to the patient's satisfaction.     Meghan Anderson  No change.  Diagnostic colonoscopy per plan.The risks, benefits, limitations, alternatives and imponderables have been reviewed with the patient. Questions have been answered. All parties are agreeable.

## 2015-11-01 NOTE — Op Note (Signed)
Meghan Anderson [address] [place], [address]   COLONOSCOPY PROCEDURE REPORT  PATIENT: Meghan Anderson, Meghan Anderson  MR#: [id number] BIRTHDATE: [birth year] , 62  yrs. old GENDER: female ENDOSCOPIST: R.  Garfield Cornea, MD FACP [facility] REFERRED BY:  Dr. Warrick Parisian PROCEDURE DATE:  11-22-15 PROCEDURE:   Sigmoidoscopy with biopsy INDICATIONS:  Progressing constipation; rectal bleeding MEDICATIONS: Versed 4 mg IV and Demerol 50 mg IV in divided doses. Zofran 4 mg IV. ASA CLASS:       Class III  CONSENT: The risks, benefits, alternatives and imponderables including but not limited to bleeding, perforation as well as the possibility of a missed lesion have been reviewed.  The potential for biopsy, lesion removal, etc. have also been discussed. Questions have been answered.  All parties agreeable.  Please see the history and physical in the medical record for more information.  DESCRIPTION OF PROCEDURE:   After the risks benefits and alternatives of the procedure were thoroughly explained, informed consent was obtained.  The digital rectal exam revealed a hard fungating mass easily palpable at the tip the examining finger.        The [id number]  endoscope was introduced through the anus and advanced to the 5-6 cm where a large fungating mass was encountered.. I was unable to get around it to complete a colonoscopy     . d.   s      COLON FINDINGS: Large fungating mass beginning at 6 cm from the anal verge.  This essentially occluded the rectosigmoid lumen.  I was unable to negotiate pediatric colonoscope around it to perform a colonoscopy.  This lesion was biopsied multiple times.  This lesion was easily appreciated on digital rectal examination.  Multiple biopsies taken.  Retroflexion was not performed. .  Withdrawal time=not applicable     .  The scope was withdrawn and the procedure completed. COMPLICATIONS: There were no immediate complications. EBL 10  mL ENDOSCOPIC IMPRESSION: Large fungating rectal mass?"precluded colonoscopy. Status post biopsy  RECOMMENDATIONS: Follow-up on pathology. Low-residue diet. CT of the chest, abdomen and pelvis. CBC chem 20 and CEA today I anticipate referral to oncology in the near future.  eSigned:  R. Garfield Cornea, MD [facility] 22-Nov-2015 12:44 PM   cc:  CPT CODES: ICD CODES:  The ICD and CPT codes recommended by this software are interpretations from the data that the clinical staff has captured with the software.  The verification of the translation of this report to the ICD and CPT codes and modifiers is the sole responsibility of the health care institution and practicing physician where this report was generated.  [employer]. will not be held responsible for the validity of the ICD and CPT codes included on this report.  AMA assumes no liability for data contained or not contained herein. CPT is a Designer, television/film set of the [employer].

## 2015-11-02 LAB — CEA: CEA: 5.7 ng/mL — AB (ref 0.0–4.7)

## 2015-11-02 NOTE — Telephone Encounter (Signed)
Ct ABD/pelvis PA # is [id number] Valid from 11/02/2015-12/01/2015

## 2015-11-02 NOTE — Telephone Encounter (Signed)
Patient will likely need a CT of chest given rectal tumor however, will just get her to oncology once biopsies back and they can decide about further staging maneuvers

## 2015-11-02 NOTE — Telephone Encounter (Signed)
Noted  

## 2015-11-03 ENCOUNTER — Encounter: Payer: Self-pay | Admitting: Internal Medicine

## 2015-11-07 ENCOUNTER — Ambulatory Visit (HOSPITAL_COMMUNITY)
Admission: RE | Admit: 2015-11-07 | Discharge: 2015-11-07 | Disposition: A | Discharge status: Home / Self Care | Payer: Medicare Other | Source: Ambulatory Visit | Attending: Internal Medicine | Admitting: Internal Medicine

## 2015-11-07 ENCOUNTER — Other Ambulatory Visit: Payer: Medicare Other

## 2015-11-07 ENCOUNTER — Other Ambulatory Visit: Payer: Self-pay

## 2015-11-07 DIAGNOSIS — K769 Liver disease, unspecified: Secondary | ICD-10-CM | POA: Insufficient documentation

## 2015-11-07 DIAGNOSIS — K6289 Other specified diseases of anus and rectum: Secondary | ICD-10-CM

## 2015-11-07 DIAGNOSIS — K5900 Constipation, unspecified: Secondary | ICD-10-CM | POA: Diagnosis not present

## 2015-11-07 DIAGNOSIS — R918 Other nonspecific abnormal finding of lung field: Secondary | ICD-10-CM | POA: Insufficient documentation

## 2015-11-07 DIAGNOSIS — R634 Abnormal weight loss: Secondary | ICD-10-CM | POA: Diagnosis not present

## 2015-11-07 DIAGNOSIS — K5730 Diverticulosis of large intestine without perforation or abscess without bleeding: Secondary | ICD-10-CM | POA: Diagnosis not present

## 2015-11-07 DIAGNOSIS — R198 Other specified symptoms and signs involving the digestive system and abdomen: Secondary | ICD-10-CM | POA: Diagnosis not present

## 2015-11-07 DIAGNOSIS — K219 Gastro-esophageal reflux disease without esophagitis: Secondary | ICD-10-CM

## 2015-11-07 DIAGNOSIS — D649 Anemia, unspecified: Secondary | ICD-10-CM

## 2015-11-07 DIAGNOSIS — K625 Hemorrhage of anus and rectum: Secondary | ICD-10-CM | POA: Insufficient documentation

## 2015-11-07 MED ORDER — IOHEXOL 300 MG/ML  SOLN
80.0000 mL | Freq: Once | INTRAMUSCULAR | Status: AC | PRN
  Administered 2015-11-07: 80 mL via INTRAVENOUS

## 2015-11-07 NOTE — Progress Notes (Signed)
Lab only 

## 2015-11-08 ENCOUNTER — Telehealth: Payer: Self-pay | Admitting: Family Medicine

## 2015-11-08 ENCOUNTER — Other Ambulatory Visit: Payer: Self-pay | Admitting: *Deleted

## 2015-11-08 ENCOUNTER — Encounter (HOSPITAL_COMMUNITY): Payer: Self-pay | Admitting: Internal Medicine

## 2015-11-08 LAB — CBC WITH DIFFERENTIAL/PLATELET
BASOS ABS: 0 10*3/uL (ref 0.0–0.2)
BASOS: 0 %
EOS (ABSOLUTE): 0.1 10*3/uL (ref 0.0–0.4)
Eos: 2 %
HEMOGLOBIN: 11.1 g/dL (ref 11.1–15.9)
Hematocrit: 33.1 % — ABNORMAL LOW (ref 34.0–46.6)
IMMATURE GRANS (ABS): 0 10*3/uL (ref 0.0–0.1)
IMMATURE GRANULOCYTES: 0 %
Lymphocytes Absolute: 0.8 10*3/uL (ref 0.7–3.1)
Lymphs: 16 %
MCH: 29.3 pg (ref 26.6–33.0)
MCHC: 33.5 g/dL (ref 31.5–35.7)
MCV: 87 fL (ref 79–97)
MONOCYTES: 12 %
Monocytes Absolute: 0.6 10*3/uL (ref 0.1–0.9)
NEUTROS ABS: 3.5 10*3/uL (ref 1.4–7.0)
NEUTROS PCT: 70 %
PLATELETS: 248 10*3/uL (ref 150–379)
RBC: 3.79 x10E6/uL (ref 3.77–5.28)
RDW: 15.6 % — ABNORMAL HIGH (ref 12.3–15.4)
WBC: 5.1 10*3/uL (ref 3.4–10.8)

## 2015-11-08 LAB — BMP8+EGFR
BUN / CREAT RATIO: 11 (ref 11–26)
BUN: 14 mg/dL (ref 8–27)
CALCIUM: 7.9 mg/dL — AB (ref 8.7–10.3)
CHLORIDE: 101 mmol/L (ref 96–106)
CO2: 24 mmol/L (ref 18–29)
CREATININE: 1.25 mg/dL — AB (ref 0.57–1.00)
GFR calc Af Amer: 48 mL/min/{1.73_m2} — ABNORMAL LOW (ref >59)
GFR calc non Af Amer: 42 mL/min/{1.73_m2} — ABNORMAL LOW (ref >59)
GLUCOSE: 94 mg/dL (ref 65–99)
Potassium: 3.3 mmol/L — ABNORMAL LOW (ref 3.5–5.2)
Sodium: 140 mmol/L (ref 134–144)

## 2015-11-08 NOTE — Telephone Encounter (Signed)
Reviewed results and need for new medications.

## 2015-11-14 ENCOUNTER — Encounter (HOSPITAL_COMMUNITY): Payer: Medicare Other | Attending: Hematology & Oncology | Admitting: Hematology & Oncology

## 2015-11-14 ENCOUNTER — Encounter (HOSPITAL_COMMUNITY): Payer: Self-pay | Admitting: Lab

## 2015-11-14 VITALS — BP 165/94 | HR 101 | Temp 97.8°F | Resp 18 | Wt 124.5 lb

## 2015-11-14 DIAGNOSIS — D649 Anemia, unspecified: Secondary | ICD-10-CM | POA: Diagnosis not present

## 2015-11-14 DIAGNOSIS — K5902 Outlet dysfunction constipation: Secondary | ICD-10-CM

## 2015-11-14 DIAGNOSIS — R634 Abnormal weight loss: Secondary | ICD-10-CM | POA: Diagnosis not present

## 2015-11-14 DIAGNOSIS — J449 Chronic obstructive pulmonary disease, unspecified: Secondary | ICD-10-CM | POA: Diagnosis not present

## 2015-11-14 DIAGNOSIS — R198 Other specified symptoms and signs involving the digestive system and abdomen: Secondary | ICD-10-CM

## 2015-11-14 DIAGNOSIS — N183 Chronic kidney disease, stage 3 (moderate): Secondary | ICD-10-CM

## 2015-11-14 DIAGNOSIS — K6289 Other specified diseases of anus and rectum: Secondary | ICD-10-CM

## 2015-11-14 NOTE — Progress Notes (Signed)
Referral to [facility] Dr Marcello Moores, appt 1/30@830 .  Patient aware of appt

## 2015-11-14 NOTE — Progress Notes (Signed)
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 [name] revealing large fungating rectal mass, precluded colonoscopy Weight loss CT Abdomen/Pelvis 11/01/15 with irregular annular thickening in mid rectum with rectal intussusception, 4 hypodense subcentimeter liver lesions too small to characterize, chronic infectious or inflammatory bronchiolitis at the lung bases COPD  HISTORY OF PRESENTING ILLNESS:  Meghan Anderson 80 y.o. female is here because of referral from Dr. [name] for a newly diagnosed rectal mass.   Meghan Anderson is accompanied by her two sons and two daughters. I have personally reviewed the results of recent biopsy results and colonoscopy findings with the patient and her family. The patient [place] her eldest [place].   She has been experiencing severe constipation and she is unable to move her bowels without stool softeners. Denies vomiting unless she "takes too much of the stuff". She was given a powder similar to Miralax. Her daughter notes that she also takes Ex Optician, dispensing. She takes the stool softeners every other day, "about two sometimes three if two does not work". She is not sure if she uses the powder daily, then states that she stopped recently.   Reports about 10 pounds of weight loss recently. This weight loss is attributed to appetite loss and fear of difficulty with bowel movements. She states that she eats mashed potatoes.   She had a colonoscopy performed by Dr. [name]. The youngest son was told that a mass was found but that not much was known until it was sent for testing.  The  patient denies any rectal pain. She has had problems with bleeding. She has COPD and follows with a pulmonologist. She lives independently. She does not have routine screening studies including mammography.   She is here today for further discussion of her rectal mass and how to proceed.   MEDICAL HISTORY:  Past Medical History  Diagnosis Date  . Hypertension   . Hyperlipidemia   . Chronic bronchitis ([place])   . Thyroid disease   . GERD (gastroesophageal reflux disease)   . Arthritis   . Murmur, heart   . Cataract   . Hypothyroidism     SURGICAL HISTORY: Past Surgical History  Procedure Laterality Date  . Left ear    . Abdominal hysterectomy      complete  . Colonoscopy N/A 11/01/2015    Procedure: COLONOSCOPY;  Surgeon: [name], MD;  Location: [facility];  Service: Endoscopy;  Laterality: N/A;  1300 - moved to 12:00 - office to notify pt    SOCIAL HISTORY: Social History   Social History  . Marital Status: Widowed    Spouse Name: N/A  . Number of Children: N/A  . Years of Education: N/A   Occupational History  . Not on file.   Social History Main Topics  . Smoking status: Former Smoker    Quit date: 11/11/2010  . Smokeless tobacco: Never Used  . Alcohol Use: No  . Drug Use: No  . Sexual Activity: Not on file   Other Topics Concern  . Not on file   Social History Narrative   Widowed for 38 years. 2 sons and 2 daughters. 9  grandchildren 54 great-grandchildren Ex smoker, quit about 2 years ago.  She enjoys painting and riding her golf cart on the farm.  She is from this area.   FAMILY HISTORY: Family History  Problem Relation Age of Onset  . Kidney disease Mother     nephrectomy x 1  . COPD Brother   . Emphysema Brother   . Asthma Brother   . Cancer Brother 37    brain  . Colon cancer Neg Hx    indicated that her mother is deceased. She indicated that her father is deceased. She indicated that her sister is alive. She indicated that three  of her six brothers are alive.   Mother deceased at 44 yo, congestive heart failure. Father deceased at 14 yo, lung troubles, gall stones, and congestive heart failure.  6 siblings. Only two living. Her baby brother and her twin brother are still living. One brother died of a brain tumor in his 65s. Lived with brain tumor for about 10 years. One brother had lung problems and died of a heart attack in his 10s. One brother had stomach cancer and died in his early 34s. One sister died at 63 yo of congestive heart failure.  ALLERGIES:  is allergic to prednisone and quinolones.  MEDICATIONS:  Current Outpatient Prescriptions  Medication Sig Dispense Refill  . albuterol (PROVENTIL) (2.5 MG/3ML) 0.083% nebulizer solution NEBULIZE 1 VIAL EVERY 4 HOURS AS NEEDED 150 mL 2  . atorvastatin (LIPITOR) 80 MG tablet Take 1 tablet (80 mg total) by mouth daily. 90 tablet 3  . fluticasone (FLONASE) 50 MCG/ACT nasal spray Place 2 sprays into both nostrils daily as needed. 16 g 1  . Fluticasone Furoate-Vilanterol (BREO ELLIPTA) 100-25 MCG/INH AEPB USE 1 PUFF ONCE DAILY    . levothyroxine (SYNTHROID, LEVOTHROID) 150 MCG tablet Take 1 tablet (150 mcg total) by mouth daily. 90 tablet 3  . lisinopril-hydrochlorothiazide (PRINZIDE,ZESTORETIC) 20-12.5 MG per tablet Take 2 tablets by mouth daily. 60 tablet 3  . omeprazole (PRILOSEC) 40 MG capsule TAKE ONE (1) CAPSULE EACH DAY 30 capsule 4  . polyethylene glycol powder (GLYCOLAX/MIRALAX) powder Take 17 g by mouth 2 (two) times daily as needed. 3350 g 1  . potassium chloride (K-DUR,KLOR-CON) 10 MEQ tablet Take 10 mEq by mouth daily.    Marland Kitchen tiotropium (SPIRIVA HANDIHALER) 18 MCG inhalation capsule Place 1 capsule (18 mcg total) into inhaler and inhale daily. 90 capsule 3  . amLODipine (NORVASC) 5 MG tablet Take 1 tablet (5 mg total) by mouth daily. (Patient not taking: Reported on 11/14/2015) 30 tablet 5  . HYDROcodone-homatropine (HYCODAN) 5-1.5 MG/5ML syrup Take 5 mLs by  mouth every 4 (four) hours as needed for cough. Reported on 11/14/2015    . hydrocortisone (ANUSOL-HC) 25 MG suppository Place 1 suppository (25 mg total) rectally 2 (two) times daily. (Patient not taking: Reported on 11/14/2015) 12 suppository 0  . polyethylene glycol-electrolytes (NULYTELY/GOLYTELY) 420 G solution Take 4,000 mLs by mouth once. (Patient not taking: Reported on 11/14/2015) 4000 mL 0   No current facility-administered medications for this visit.    Review of Systems  Constitutional: Positive for weight loss.       About 10 pounds weight loss attributed to appetite loss and fear of difficulty passing stool.  HENT: Negative.   Eyes: Negative.   Respiratory: Negative.   Cardiovascular: Negative.   Gastrointestinal: Positive for constipation.       Managed with stool softeners - Ex Lax and Dulcolax. Unable to pass stool without  stool softeners.  Genitourinary: Negative.   Musculoskeletal: Negative.   Skin: Negative.   Neurological: Negative.   Endo/Heme/Allergies: Negative.   Psychiatric/Behavioral: Negative.   All other systems reviewed and are negative.  14 point ROS was done and is otherwise as detailed above or in HPI   PHYSICAL EXAMINATION: ECOG PERFORMANCE STATUS: 1 - Symptomatic but completely ambulatory  Filed Vitals:   11/14/15 1516  BP: 165/94  Pulse: 101  Temp: 97.8 F (36.6 C)  Resp: 18   Filed Weights   11/14/15 1516  Weight: 124 lb 8 oz (56.473 kg)     Physical Exam  Constitutional: She is oriented to person, place, and time and well-developed, well-nourished, and in no distress.  HENT:  Head: Normocephalic and atraumatic.  Mouth/Throat: Oropharynx is clear and moist.  Eyes: Conjunctivae and EOM are normal. Pupils are equal, round, and reactive to light.  Neck: Normal range of motion. Neck supple. No JVD present. No tracheal deviation present. No thyromegaly present.  Cardiovascular: Normal rate, regular rhythm and normal heart sounds.     Pulmonary/Chest: Effort normal.  Coarse rhonchi scattered throughout.  Abdominal: Soft. Bowel sounds are normal. She exhibits no distension. There is no tenderness. There is no rebound and no guarding.  Genitourinary:  Rectal exam deferred per patient request.  Musculoskeletal: Normal range of motion.  Lymphadenopathy:    She has no cervical adenopathy.  Neurological: She is alert and oriented to person, place, and time. No cranial nerve deficit. Gait normal. Coordination normal.  Skin: Skin is warm and dry.  Psychiatric: Affect and judgment normal.  Nursing note and vitals reviewed.  PATHOLOGY:    LABORATORY DATA:  I have reviewed the data as listed Lab Results  Component Value Date   WBC 5.1 11/07/2015   HGB 9.9* 11/01/2015   HCT 33.1* 11/07/2015   MCV 93.2 11/01/2015   PLT 225 11/01/2015   CMP     Component Value Date/Time   NA 140 11/07/2015 1255   NA 140 11/01/2015 1323   K 3.3* 11/07/2015 1255   CL 101 11/07/2015 1255   CO2 24 11/07/2015 1255   GLUCOSE 94 11/07/2015 1255   GLUCOSE 88 11/01/2015 1323   BUN 14 11/07/2015 1255   BUN 16 11/01/2015 1323   CREATININE 1.25* 11/07/2015 1255   CALCIUM 7.9* 11/07/2015 1255   PROT 4.9* 11/01/2015 1323   PROT 5.8* 12/29/2014 1155   ALBUMIN 2.7* 11/01/2015 1323   ALBUMIN 3.5 12/29/2014 1155   AST 13* 11/01/2015 1323   ALT 8* 11/01/2015 1323   ALKPHOS 53 11/01/2015 1323   BILITOT 0.4 11/01/2015 1323   BILITOT 0.2 12/29/2014 1155   GFRNONAA 42* 11/07/2015 1255   GFRAA 48* 11/07/2015 1255     RADIOGRAPHIC STUDIES: I have personally reviewed the radiological images as listed and agreed with the findings in the report. Study Result     CLINICAL DATA: 80 year old female with reported history of rectal mass on recent colonoscopy and weight loss. Patient reports chronic constipation and rectal bleeding. Gastroesophageal reflux disease. Prior hysterectomy.  EXAM: CT ABDOMEN AND PELVIS WITH  CONTRAST  TECHNIQUE: Multidetector CT imaging of the abdomen and pelvis was performed using the standard protocol following bolus administration of intravenous contrast.  CONTRAST: 45m OMNIPAQUE IOHEXOL 300 MG/ML SOLN  COMPARISON: None.  FINDINGS: Lower chest: There are patchy ground-glass opacities with associated mild cylindrical bronchiectasis in the visualized right middle lobe, lingula and right greater than left lower lobes.  Hepatobiliary: There are four tiny hypodense  liver lesions scattered throughout the liver, largest 0.5 cm in segment 5 of the right liver lobe (series 2/ image 21), too small to characterize. Otherwise normal liver. Normal gallbladder with no radiopaque cholelithiasis. No biliary ductal dilatation.  Pancreas: Normal, with no mass or duct dilation.  Spleen: Normal size. No mass.  Adrenals/Urinary Tract: Normal adrenals. Simple 1.5 cm renal cyst in the posterior upper right kidney. There are a few additional subcentimeter hypodense lesions in both kidneys, too small to characterize. No hydronephrosis. Normal bladder.  Stomach/Bowel: Grossly normal stomach. Normal caliber small bowel with no small bowel wall thickening. Appendix is not discretely visualized. Moderate sigmoid diverticulosis, with no large bowel wall thickening or pericolonic fat stranding. There is irregular annular wall thickening in the mid rectum with associated intussusception, best seen on the coronal series 4/image 63.  Vascular/Lymphatic: Atherosclerotic nonaneurysmal abdominal aorta. Patent portal, splenic, hepatic and renal veins. No pathologically enlarged lymph nodes in the abdomen or pelvis. There is a borderline prominent 0.7 cm upper presacral node in the midline (series 2/image 53).  Reproductive: Status post hysterectomy, with no abnormal findings at the vaginal cuff. No adnexal mass.  Other: No pneumoperitoneum, ascites or focal fluid  collection.  Musculoskeletal: No aggressive appearing focal osseous lesions. Asymmetric moderate osteoarthritis in the left hip joint. Marked degenerative changes in the visualized thoracolumbar spine.  IMPRESSION: 1. Irregular annular wall thickening in the mid rectum with associated rectal intussusception, presumably representing a primary rectal neoplasm. 2. Borderline prominent upper presacral lymph node, possibly metastatic. 3. Four hypodense subcentimeter liver lesions, too small to characterize. If the rectal mass proves to represent a rectal malignancy, a follow-up CT abdomen with IV contrast or MRI abdomen with and without IV contrast is recommended in 3 months. 4. Chronic infectious or inflammatory bronchiolitis at the lung bases, likely due to atypical mycobacterial infection (MAI) or recurrent aspiration. 5. Moderate sigmoid diverticulosis.   Electronically Signed  By: Ilona Sorrel M.D.  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 [name] revealing large fungating rectal mass, precluded colonoscopy Weight loss CT Abdomen/Pelvis 11/01/15 with irregular annular thickening in mid rectum with rectal intussusception, 4 hypodense subcentimeter liver lesions too small to characterize, chronic infectious or inflammatory bronchiolitis at the lung bases COPD Anemia CKD, stage 3  I discussed with the patient and family that the biopsy does not reveal invasive cancer, however I feel that given the size of the tumor that is unlikely. Given that Dr. [name] could not do a colonoscopy I am not sure that an EUS would be possible but this can be discussed with Dr. Ardis Hughs. I am going to refer the patient to Dr. Marcello Moores with [facility] for surgical consultation. Her COPD is certainly a concern, but with appropriate pulmonary involvement she  may be fine for surgery. We will wait for Dr. Marcello Moores' input prior to formulating a definitive plan at this point. She will need an additional biopsy before considering neo-adjuvant treatment.   The patient met with our patient navigator, Hildred Alamin, today. The eldest son would like to be added as a contact as his mother cannot hear very well on the phone sometimes. She lives with her eldest son.  I will see Ms. Kadar again after her consultation with Dr. Marcello Moores.   I reviewed how to use Miralax with the patient and family. If she feels that  a cap full daily is too much, she can take half a cap. I discussed the importance of keeping her stool soft since she is having difficulty passing stool.   All questions were answered. The patient knows to call the clinic with any problems, questions or concerns.  This document serves as a record of services personally performed by Ancil Linsey, MD. It was created on her behalf by Arlyce Harman, a trained medical scribe. The creation of this record is based on the scribe's personal observations and the provider's statements to them. This document has been checked and approved by the attending provider.  I have reviewed the above documentation for accuracy and completeness, and I agree with the above.  This note was electronically signed.    Molli Hazard, MD  11/14/2015 3:28 PM

## 2015-11-14 NOTE — Patient Instructions (Addendum)
[place] at [facility] Discharge Instructions  RECOMMENDATIONS MADE BY THE CONSULTANT AND ANY TEST RESULTS WILL BE SENT TO YOUR REFERRING PHYSICIAN.  We recommend that you take the Miralax daily. If one capful is too much then you can take a half of a capful.  You can take the dulcolax as needed to have bowel movements.   We are referring you to Dr. Marcello Moores, who will see you January 30@8 :30am.  They are located at [facility].     We will see you after the surgery to discuss further treatment. We will follow up with the liver as well with scans in a few months.    Thank you for choosing [place] at [facility] to provide your oncology and hematology care.  To afford each patient quality time with our provider, please arrive at least 15 minutes before your scheduled appointment time.    You need to re-schedule your appointment should you arrive 10 or more minutes late.  We strive to give you quality time with our providers, and arriving late affects you and other patients whose appointments are after yours.  Also, if you no show three or more times for appointments you may be dismissed from the clinic at the providers discretion.     Again, thank you for choosing [facility].  Our hope is that these requests will decrease the amount of time that you wait before being seen by our physicians.       _____________________________________________________________  Should you have questions after your visit to [facility], please contact our office at [phone] between the hours of 8:30 a.m. and 4:30 p.m.  Voicemails left after 4:30 p.m. will not be returned until the following business day.  For prescription refill requests, have your pharmacy contact our office.

## 2015-11-21 ENCOUNTER — Other Ambulatory Visit: Payer: Self-pay | Admitting: General Surgery

## 2015-11-21 DIAGNOSIS — K629 Disease of anus and rectum, unspecified: Secondary | ICD-10-CM | POA: Diagnosis not present

## 2015-11-21 NOTE — H&P (Signed)
History of Present Illness [name] MD; [id number] 12:13 PM) Patient words: rectal mass.  The patient is a 80 year old female who presents with a colorectal polyp. Patient underwent colonoscopy due to rectal bleeding and no onset constipation. A rectal mass was noted in her distal rectum and this was unable to be traversed with the colonoscope. Biopsies show only adenoma. She is taking laxatives on a daily basis to have bowel movements. On her CT scan she appears to have a large rectal polyp with some intussusception. Her CEA level is slightly elevated. CT abdomen shows the rectal wall thickening and some borderline lymphadenopathy. There are 4 small liver lesions that are too small to be characterized currently. The patient has a history of COPD which is managed with broncodilators and steroid inhalers. She was seen by Dr. Whitney Muse and it was felt that she is unable to go through any further workup or treatment due to the obstructive nature of her tumor.   Other Problems Mammie Lorenzo, LPN; 579FGE [id number] AM) Asthma Chronic Obstructive Lung Disease High blood pressure Hypercholesterolemia Thyroid Disease  Past Surgical History Mammie Lorenzo, LPN; 579FGE [id number] AM) Hysterectomy (not due to cancer) - Complete  Diagnostic Studies History Mammie Lorenzo, LPN; 579FGE [id number] AM) Colonoscopy within last year Mammogram 1-3 years ago Pap Smear >5 years ago  Allergies Marjean Donna, CMA; 11/21/2015 9:46 AM) PredniSONE (Pak) *CORTICOSTEROIDS*  Medication History Marjean Donna, CMA; 11/21/2015 9:47 AM) Albuterol Sulfate ((2.5 MG/3ML)0.083% Nebulized Soln, Inhalation) Active. Anucort-HC (25MG  Suppository, Rectal) Active. Atorvastatin Calcium (20MG  Tablet, Oral) Active. Spiriva HandiHaler (18MCG Capsule, Inhalation) Active. PredniSONE (20MG  Tablet, Oral) Active. Levothyroxine Sodium (150MCG Tablet, Oral) Active. Medications Reconciled  Social History Mammie Lorenzo, LPN; 579FGE [id number] AM) Caffeine use Coffee, Tea. No alcohol use No drug use Tobacco use Former smoker.  Family History Mammie Lorenzo, LPN; 579FGE [id number] AM) Arthritis Daughter, Son. Cancer Brother. Colon Polyps Daughter. Diabetes Mellitus Sister. Heart Disease Brother, Daughter, Sister, Son. Heart disease in female family member before age 69 Heart disease in female family member before age 54 Kidney Disease Mother. Respiratory Condition Brother, Father. Thyroid problems Sister.  Pregnancy / Birth History Mammie Lorenzo, LPN; 579FGE [id number] AM) Durenda Age 7 Maternal age 58-20 Para 4     Review of Systems Mammie Lorenzo LPN; 579FGE [id number] AM) General Present- Appetite Loss and Weight Loss. Not Present- Chills, Fatigue, Fever, Night Sweats and Weight Gain. Skin Not Present- Change in Wart/Mole, Dryness, Hives, Jaundice, New Lesions, Non-Healing Wounds, Rash and Ulcer. HEENT Not Present- Earache, Hearing Loss, Hoarseness, Nose Bleed, Oral Ulcers, Ringing in the Ears, Seasonal Allergies, Sinus Pain, Sore Throat, Visual Disturbances, Wears glasses/contact lenses and Yellow Eyes. Respiratory Present- Difficulty Breathing. Not Present- Bloody sputum, Chronic Cough, Snoring and Wheezing. Breast Not Present- Breast Mass, Breast Pain, Nipple Discharge and Skin Changes. Gastrointestinal Present- Bloody Stool and Constipation. Not Present- Abdominal Pain, Bloating, Change in Bowel Habits, Chronic diarrhea, Difficulty Swallowing, Excessive gas, Gets full quickly at meals, Hemorrhoids, Indigestion, Nausea, Rectal Pain and Vomiting. Female Genitourinary Not Present- Frequency, Nocturia, Painful Urination, Pelvic Pain and Urgency. Musculoskeletal Not Present- Back Pain, Joint Pain, Joint Stiffness, Muscle Pain, Muscle Weakness and Swelling of Extremities. Neurological Not Present- Decreased Memory, Fainting, Headaches, Numbness, Seizures, Tingling, Tremor, Trouble  walking and Weakness. Psychiatric Not Present- Anxiety, Bipolar, Change in Sleep Pattern, Depression, Fearful and Frequent crying. Endocrine Not Present- Cold Intolerance, Excessive Hunger, Hair Changes, Heat Intolerance, Hot flashes and New Diabetes. Hematology Not Present- Easy Bruising, Excessive bleeding, Gland problems, HIV and  Persistent Infections.  Vitals (Sonya Bynum CMA; 11/21/2015 9:47 AM) 11/21/2015 9:47 AM Weight: 124 lb Height: 64in Body Surface Area: 1.6 m Body Mass Index: 21.28 kg/m  Temp.: 97.50F(Temporal)  Pulse: 88 (Regular)  BP: 144/80 (Sitting, Left Arm, Standard)      Physical Exam [name] MD; [id number] 12:15 PM)  General Mental Status-Alert. General Appearance-Not in acute distress. Build & Nutrition-Well nourished. Posture-Normal posture. Gait-Normal.  Head and Neck Head-normocephalic, atraumatic with no lesions or palpable masses. Trachea-midline.  Chest and Lung Exam Chest and lung exam reveals -on auscultation, normal breath sounds, no adventitious sounds and normal vocal resonance.  Cardiovascular Cardiovascular examination reveals -normal heart sounds, regular rate and rhythm with no murmurs and femoral artery auscultation bilaterally reveals normal pulses, no bruits, no thrills.  Abdomen Inspection Inspection of the abdomen reveals - No Hernias. Palpation/Percussion Palpation and Percussion of the abdomen reveal - Soft, Non Tender, No Rigidity (guarding), No hepatosplenomegaly and No Palpable abdominal masses.  Rectal Note: Palpable mass appearing to arise posteriorly, does not appear to be fixed. Middle of mass appears to be hardened and is concerning for invasive carcinoma.  Neurologic Neurologic evaluation reveals -alert and oriented x 3 with no impairment of recent or remote memory, normal attention span and ability to concentrate, normal sensation and normal  coordination.  Musculoskeletal Normal Exam - Bilateral-Upper Extremity Strength Normal and Lower Extremity Strength Normal.    Assessment & Plan [name] MD; [id number] 12:16 PM)  RECTAL MASS (K62.9) Impression: 80 year old female with a large rectal mass causing obstruction who presents to the office as a referral from Dr. Whitney Muse. On exam she has a large mobile mass in the distal rectum. I believe this can be removed (at the least partially) via a transanal excision to help assist with biopsy. I think this will also help relieve some of her obstructive symptoms. If for some reason, we cannot get this accomplished in the operating room, we will plan on doing a diverting colostomy/Hartmann procedure. The surgery and anatomy were described to the patient as well as the risks of surgery and the possible complications. These include: Bleeding, deep abdominal infections and possible wound complications such as hernia and infection, damage to adjacent structures, leak of surgical connections, which can lead to other surgeries and possibly an ostomy, possible need for other procedures, such as abscess drains in radiology, possible prolonged hospital stay, possible diarrhea from removal of part of the colon, possible constipation from narcotics, possible bowel, bladder or sexual dysfunction if having rectal surgery, prolonged fatigue/weakness or appetite loss, possible early recurrence of of disease, possible complications of their medical problems such as heart disease or arrhythmias or lung problems, death (less than 1%). I believe the patient understands and wishes to proceed with the surgery.

## 2015-12-05 ENCOUNTER — Ambulatory Visit: Payer: Medicare Other | Admitting: Nurse Practitioner

## 2015-12-06 NOTE — Patient Instructions (Signed)
Meghan Anderson  12/06/2015   Your procedure is scheduled on: 12/14/2015    Report to [facility] Main  Entrance take [facility]  elevators to 3rd floor to  [place] at    1100 AM.  Call this number if you have problems the morning of surgery [phone]   Remember: ONLY 1 PERSON MAY GO WITH YOU TO SHORT STAY TO GET  READY MORNING OF [place].  Do not eat food or drink liquids :After Midnight.     Take these medicines the morning of surgery with A SIP OF WATER: Albuterol Nebulizer if needed, Flonase if needed, Breo Ellipta and bring, Synthroid, Prilosec, Spiriva and bring Inhaler                                 You may not have any metal on your body including hair pins and              piercings  Do not wear jewelry, make-up, lotions, powders or perfumes, deodorant             Do not wear nail polish.  Do not shave  48 hours prior to surgery.      .   Do not bring valuables to the hospital. [place].  Contacts, dentures or bridgework may not be worn into surgery.  Leave suitcase in the car. After surgery it may be brought to your room.       Special Instructions: coughing and deep breathing exercises, leg exercises               Please read over the following fact sheets you were given: _____________________________________________________________________             [facility] - Preparing for Surgery Before surgery, you can play an important role.  Because skin is not sterile, your skin needs to be as free of germs as possible.  You can reduce the number of germs on your skin by washing with CHG (chlorahexidine gluconate) soap before surgery.  CHG is an antiseptic cleaner which kills germs and bonds with the skin to continue killing germs even after washing. Please DO NOT use if you have an allergy to CHG or antibacterial soaps.  If your skin becomes reddened/irritated stop using the CHG and inform  your nurse when you arrive at Short Stay. Do not shave (including legs and underarms) for at least 48 hours prior to the first CHG shower.  You may shave your face/neck. Please follow these instructions carefully:  1.  Shower with CHG Soap the night before surgery and the  morning of Surgery.  2.  If you choose to wash your hair, wash your hair first as usual with your  normal  shampoo.  3.  After you shampoo, rinse your hair and body thoroughly to remove the  shampoo.                           4.  Use CHG as you would any other liquid soap.  You can apply chg directly  to the skin and wash  Gently with a scrungie or clean washcloth.  5.  Apply the CHG Soap to your body ONLY FROM THE NECK DOWN.   Do not use on face/ open                           Wound or open sores. Avoid contact with eyes, ears mouth and genitals (private parts).                       Wash face,  Genitals (private parts) with your normal soap.             6.  Wash thoroughly, paying special attention to the area where your surgery  will be performed.  7.  Thoroughly rinse your body with warm water from the neck down.  8.  DO NOT shower/wash with your normal soap after using and rinsing off  the CHG Soap.                9.  Pat yourself dry with a clean towel.            10.  Wear clean pajamas.            11.  Place clean sheets on your bed the night of your first shower and do not  sleep with pets. Day of Surgery : Do not apply any lotions/deodorants the morning of surgery.  Please wear clean clothes to the hospital/surgery center.  FAILURE TO FOLLOW THESE INSTRUCTIONS MAY RESULT IN THE CANCELLATION OF YOUR SURGERY PATIENT SIGNATURE_________________________________  NURSE SIGNATURE__________________________________  ________________________________________________________________________  WHAT IS A BLOOD TRANSFUSION? Blood Transfusion Information  A transfusion is the replacement of blood or some  of its parts. Blood is made up of multiple cells which provide different functions.  Red blood cells carry oxygen and are used for blood loss replacement.  White blood cells fight against infection.  Platelets control bleeding.  Plasma helps clot blood.  Other blood products are available for specialized needs, such as hemophilia or other clotting disorders. BEFORE THE TRANSFUSION  Who gives blood for transfusions?   Healthy volunteers who are fully evaluated to make sure their blood is safe. This is blood bank blood. Transfusion therapy is the safest it has ever been in the practice of medicine. Before blood is taken from a donor, a complete history is taken to make sure that person has no history of diseases nor engages in risky social behavior (examples are intravenous drug use or sexual activity with multiple partners). The donor's travel history is screened to minimize risk of transmitting infections, such as malaria. The donated blood is tested for signs of infectious diseases, such as HIV and hepatitis. The blood is then tested to be sure it is compatible with you in order to minimize the chance of a transfusion reaction. If you or a relative donates blood, this is often done in anticipation of surgery and is not appropriate for emergency situations. It takes many days to process the donated blood. RISKS AND COMPLICATIONS Although transfusion therapy is very safe and saves many lives, the main dangers of transfusion include:   Getting an infectious disease.  Developing a transfusion reaction. This is an allergic reaction to something in the blood you were given. Every precaution is taken to prevent this. The decision to have a blood transfusion has been considered carefully by your caregiver before blood is given. Blood is not given unless the benefits outweigh  the risks. AFTER THE TRANSFUSION  Right after receiving a blood transfusion, you will usually feel much better and more  energetic. This is especially true if your red blood cells have gotten low (anemic). The transfusion raises the level of the red blood cells which carry oxygen, and this usually causes an energy increase.  The nurse administering the transfusion will monitor you carefully for complications. HOME CARE INSTRUCTIONS  No special instructions are needed after a transfusion. You may find your energy is better. Speak with your caregiver about any limitations on activity for underlying diseases you may have. SEEK MEDICAL CARE IF:   Your condition is not improving after your transfusion.  You develop redness or irritation at the intravenous (IV) site. SEEK IMMEDIATE MEDICAL CARE IF:  Any of the following symptoms occur over the next 12 hours:  Shaking chills.  You have a temperature by mouth above 102 F (38.9 C), not controlled by medicine.  Chest, back, or muscle pain.  People around you feel you are not acting correctly or are confused.  Shortness of breath or difficulty breathing.  Dizziness and fainting.  You get a rash or develop hives.  You have a decrease in urine output.  Your urine turns a dark color or changes to pink, red, or brown. Any of the following symptoms occur over the next 10 days:  You have a temperature by mouth above 102 F (38.9 C), not controlled by medicine.  Shortness of breath.  Weakness after normal activity.  The white part of the eye turns yellow (jaundice).  You have a decrease in the amount of urine or are urinating less often.  Your urine turns a dark color or changes to pink, red, or brown. Document Released: 10/25/2000 Document Revised: 01/20/2012 Document Reviewed: 06/13/2008 [facility] Patient Information 2014 [place], [place].  _______________________________________________________________________

## 2015-12-07 ENCOUNTER — Ambulatory Visit: Payer: Medicare Other | Admitting: Nurse Practitioner

## 2015-12-08 ENCOUNTER — Encounter (HOSPITAL_COMMUNITY): Payer: Self-pay

## 2015-12-08 ENCOUNTER — Encounter (HOSPITAL_COMMUNITY)
Admission: RE | Admit: 2015-12-08 | Discharge: 2015-12-08 | Disposition: A | Discharge status: Home / Self Care | Payer: Medicare Other | Source: Ambulatory Visit | Attending: General Surgery | Admitting: General Surgery

## 2015-12-08 DIAGNOSIS — Z01812 Encounter for preprocedural laboratory examination: Secondary | ICD-10-CM | POA: Insufficient documentation

## 2015-12-08 HISTORY — DX: Other complications of anesthesia, initial encounter: T88.59XA

## 2015-12-08 HISTORY — DX: Nausea with vomiting, unspecified: R11.2

## 2015-12-08 HISTORY — DX: Other specified postprocedural states: Z98.890

## 2015-12-08 HISTORY — DX: Chronic obstructive pulmonary disease, unspecified: J44.9

## 2015-12-08 HISTORY — DX: Adverse effect of unspecified anesthetic, initial encounter: T41.45XA

## 2015-12-08 LAB — BASIC METABOLIC PANEL
ANION GAP: 11 (ref 5–15)
BUN: 19 mg/dL (ref 6–20)
CHLORIDE: 106 mmol/L (ref 101–111)
CO2: 24 mmol/L (ref 22–32)
Calcium: 8.9 mg/dL (ref 8.9–10.3)
Creatinine, Ser: 1.16 mg/dL — ABNORMAL HIGH (ref 0.44–1.00)
GFR calc Af Amer: 51 mL/min — ABNORMAL LOW (ref >60)
GFR, EST NON AFRICAN AMERICAN: 44 mL/min — AB (ref >60)
Glucose, Bld: 104 mg/dL — ABNORMAL HIGH (ref 65–99)
POTASSIUM: 4.3 mmol/L (ref 3.5–5.1)
SODIUM: 141 mmol/L (ref 135–145)

## 2015-12-08 LAB — ABO/RH: ABO/RH(D): B POS

## 2015-12-08 LAB — CBC
HEMATOCRIT: 37.8 % (ref 36.0–46.0)
HEMOGLOBIN: 12 g/dL (ref 12.0–15.0)
MCH: 28.9 pg (ref 26.0–34.0)
MCHC: 31.7 g/dL (ref 30.0–36.0)
MCV: 91.1 fL (ref 78.0–100.0)
Platelets: 305 10*3/uL (ref 150–400)
RBC: 4.15 MIL/uL (ref 3.87–5.11)
RDW: 15 % (ref 11.5–15.5)
WBC: 8.8 10*3/uL (ref 4.0–10.5)

## 2015-12-08 NOTE — Progress Notes (Signed)
BMP done 12/08/15 faxed via EPIC to Dr Marcello Moores.

## 2015-12-08 NOTE — Consult Note (Signed)
WOC ostomy consult note Patient seen for preoperative stoma site selection per Dr. Marcello Moores' request.  Abdomen assessed in the sitting and standing positions.  Large crease at the umbilicus and numerous creases below on the right.  There is a small area of flat plane on the LLQ and I have marked there for a possible stoma site.  Abdomen is cleansed x2 with CHG wipe and allowed to dry in-between cleansings.  LLQ site marked is [id number] below the umbilicus and 4cm to the left. Daughter is with her today and both are taught that the stoma care nurse would be a resource to them in the event that a stoma had to be created intraoperatively.  They indicate understanding and appreciation. The [facility] nursing team will not follow, but will remain available to this patient, the nursing, surgical and medical teams.  Please re-consult if an ostomy is created intraoperatively. Thanks, Meghan Flakes, MSN, RN, [place], Arther Abbott  Pager# [phone]

## 2015-12-08 NOTE — Progress Notes (Signed)
EKG-05/13/2015 on chart  ECHO- 05/13/15- Care Everywhere  CXR- 05/12/15- [facility] Everywhere

## 2015-12-11 ENCOUNTER — Other Ambulatory Visit: Payer: Self-pay | Admitting: Family Medicine

## 2015-12-14 ENCOUNTER — Ambulatory Visit (HOSPITAL_COMMUNITY): Payer: Medicare Other | Admitting: Anesthesiology

## 2015-12-14 ENCOUNTER — Encounter (HOSPITAL_COMMUNITY): Payer: Self-pay | Admitting: *Deleted

## 2015-12-14 ENCOUNTER — Encounter (HOSPITAL_COMMUNITY)
Admission: RE | Disposition: A | Discharge status: Home / Self Care | Payer: Self-pay | Source: Ambulatory Visit | Attending: General Surgery

## 2015-12-14 ENCOUNTER — Inpatient Hospital Stay (HOSPITAL_COMMUNITY)
Admission: RE | Admit: 2015-12-14 | Discharge: 2015-12-19 | DRG: 376 | Disposition: A | Discharge status: Home / Self Care | Payer: Medicare Other | Source: Ambulatory Visit | Attending: General Surgery | Admitting: General Surgery

## 2015-12-14 ENCOUNTER — Ambulatory Visit (HOSPITAL_COMMUNITY): Payer: Medicare Other | Admitting: Hematology & Oncology

## 2015-12-14 DIAGNOSIS — J449 Chronic obstructive pulmonary disease, unspecified: Secondary | ICD-10-CM | POA: Diagnosis not present

## 2015-12-14 DIAGNOSIS — K219 Gastro-esophageal reflux disease without esophagitis: Secondary | ICD-10-CM | POA: Diagnosis present

## 2015-12-14 DIAGNOSIS — Z01812 Encounter for preprocedural laboratory examination: Secondary | ICD-10-CM | POA: Diagnosis not present

## 2015-12-14 DIAGNOSIS — I10 Essential (primary) hypertension: Secondary | ICD-10-CM | POA: Diagnosis present

## 2015-12-14 DIAGNOSIS — Z87891 Personal history of nicotine dependence: Secondary | ICD-10-CM

## 2015-12-14 DIAGNOSIS — G8918 Other acute postprocedural pain: Secondary | ICD-10-CM | POA: Diagnosis not present

## 2015-12-14 DIAGNOSIS — Z90710 Acquired absence of both cervix and uterus: Secondary | ICD-10-CM

## 2015-12-14 DIAGNOSIS — Z7952 Long term (current) use of systemic steroids: Secondary | ICD-10-CM

## 2015-12-14 DIAGNOSIS — K5900 Constipation, unspecified: Secondary | ICD-10-CM

## 2015-12-14 DIAGNOSIS — K6289 Other specified diseases of anus and rectum: Secondary | ICD-10-CM | POA: Diagnosis not present

## 2015-12-14 DIAGNOSIS — E039 Hypothyroidism, unspecified: Secondary | ICD-10-CM | POA: Diagnosis present

## 2015-12-14 DIAGNOSIS — C20 Malignant neoplasm of rectum: Secondary | ICD-10-CM | POA: Diagnosis not present

## 2015-12-14 DIAGNOSIS — J45909 Unspecified asthma, uncomplicated: Secondary | ICD-10-CM | POA: Diagnosis present

## 2015-12-14 DIAGNOSIS — Z79899 Other long term (current) drug therapy: Secondary | ICD-10-CM

## 2015-12-14 HISTORY — PX: TRANSANAL ENDOSCOPIC MICROSURGERY: SHX5281

## 2015-12-14 LAB — TYPE AND SCREEN
ABO/RH(D): B POS
ANTIBODY SCREEN: NEGATIVE

## 2015-12-14 SURGERY — MICROSURGERY, ENDOSCOPIC, ANAL APPROACH
Anesthesia: General | Site: Rectum

## 2015-12-14 MED ORDER — LIDOCAINE HCL (CARDIAC) 20 MG/ML IV SOLN
INTRAVENOUS | Status: AC
  Filled 2015-12-14: qty 5

## 2015-12-14 MED ORDER — MEPERIDINE HCL 50 MG/ML IJ SOLN: 6.2500 mg | INTRAMUSCULAR | Status: DC | PRN

## 2015-12-14 MED ORDER — ROCURONIUM BROMIDE 100 MG/10ML IV SOLN
INTRAVENOUS | Status: AC
  Filled 2015-12-14: qty 1

## 2015-12-14 MED ORDER — FLUTICASONE PROPIONATE 50 MCG/ACT NA SUSP: 2.0000 | Freq: Every day | NASAL | Status: DC | PRN

## 2015-12-14 MED ORDER — LISINOPRIL 40 MG PO TABS
40.0000 mg | ORAL_TABLET | Freq: Every day | ORAL | Status: DC
  Administered 2015-12-14 – 2015-12-19 (×6): 40 mg via ORAL
  Filled 2015-12-14 (×6): qty 1

## 2015-12-14 MED ORDER — LACTATED RINGERS IV SOLN
INTRAVENOUS | Status: DC | PRN
  Administered 2015-12-14: 12:00:00 via INTRAVENOUS

## 2015-12-14 MED ORDER — 0.9 % SODIUM CHLORIDE (POUR BTL) OPTIME
TOPICAL | Status: DC | PRN
  Administered 2015-12-14: 1000 mL

## 2015-12-14 MED ORDER — POLYETHYLENE GLYCOL 3350 17 GM/SCOOP PO POWD
17.0000 g | Freq: Every day | ORAL | Status: DC
  Filled 2015-12-14: qty 255

## 2015-12-14 MED ORDER — TIOTROPIUM BROMIDE MONOHYDRATE 18 MCG IN CAPS
18.0000 ug | ORAL_CAPSULE | Freq: Every day | RESPIRATORY_TRACT | Status: DC
  Administered 2015-12-15 – 2015-12-19 (×4): 18 ug via RESPIRATORY_TRACT
  Filled 2015-12-14: qty 5

## 2015-12-14 MED ORDER — SIMETHICONE 80 MG PO CHEW
40.0000 mg | CHEWABLE_TABLET | Freq: Four times a day (QID) | ORAL | Status: DC | PRN
  Filled 2015-12-14: qty 1

## 2015-12-14 MED ORDER — ROCURONIUM BROMIDE 100 MG/10ML IV SOLN
INTRAVENOUS | Status: DC | PRN
  Administered 2015-12-14: 50 mg via INTRAVENOUS
  Administered 2015-12-14: 5 mg via INTRAVENOUS

## 2015-12-14 MED ORDER — POLYETHYLENE GLYCOL 3350 17 G PO PACK
17.0000 g | PACK | Freq: Every day | ORAL | Status: DC
  Administered 2015-12-15 – 2015-12-16 (×2): 17 g via ORAL
  Filled 2015-12-14 (×4): qty 1

## 2015-12-14 MED ORDER — FENTANYL CITRATE (PF) 250 MCG/5ML IJ SOLN
INTRAMUSCULAR | Status: AC
  Filled 2015-12-14: qty 5

## 2015-12-14 MED ORDER — SODIUM CHLORIDE 0.9 % IV SOLN
1.0000 g | INTRAVENOUS | Status: DC
  Administered 2015-12-15 – 2015-12-18 (×4): 1 g via INTRAVENOUS
  Filled 2015-12-14 (×5): qty 1

## 2015-12-14 MED ORDER — SENNOSIDES-DOCUSATE SODIUM 8.6-50 MG PO TABS
1.0000 | ORAL_TABLET | Freq: Every day | ORAL | Status: DC
  Administered 2015-12-14 – 2015-12-16 (×3): 1 via ORAL
  Filled 2015-12-14 (×8): qty 1

## 2015-12-14 MED ORDER — SODIUM CHLORIDE 0.9 % IV SOLN
1.0000 g | INTRAVENOUS | Status: AC
  Administered 2015-12-14: 1 g via INTRAVENOUS
  Filled 2015-12-14: qty 1

## 2015-12-14 MED ORDER — HYDROCHLOROTHIAZIDE 25 MG PO TABS
25.0000 mg | ORAL_TABLET | Freq: Every day | ORAL | Status: DC
  Administered 2015-12-14 – 2015-12-19 (×6): 25 mg via ORAL
  Filled 2015-12-14 (×6): qty 1

## 2015-12-14 MED ORDER — ALBUTEROL SULFATE (2.5 MG/3ML) 0.083% IN NEBU
2.5000 mg | INHALATION_SOLUTION | Freq: Four times a day (QID) | RESPIRATORY_TRACT | Status: DC | PRN
  Administered 2015-12-15 – 2015-12-17 (×2): 2.5 mg via RESPIRATORY_TRACT
  Filled 2015-12-14 (×2): qty 3

## 2015-12-14 MED ORDER — ONDANSETRON HCL 4 MG/2ML IJ SOLN
INTRAMUSCULAR | Status: DC | PRN
  Administered 2015-12-14: 4 mg via INTRAVENOUS

## 2015-12-14 MED ORDER — ENOXAPARIN SODIUM 40 MG/0.4ML ~~LOC~~ SOLN
40.0000 mg | SUBCUTANEOUS | Status: DC
  Administered 2015-12-15 – 2015-12-19 (×5): 40 mg via SUBCUTANEOUS
  Filled 2015-12-14 (×6): qty 0.4

## 2015-12-14 MED ORDER — BISACODYL 10 MG RE SUPP: 10.0000 mg | Freq: Every day | RECTAL | Status: DC | PRN

## 2015-12-14 MED ORDER — HYDROMORPHONE HCL 1 MG/ML IJ SOLN
INTRAMUSCULAR | Status: AC
  Filled 2015-12-14: qty 1

## 2015-12-14 MED ORDER — PROMETHAZINE HCL 25 MG/ML IJ SOLN
INTRAMUSCULAR | Status: AC
  Filled 2015-12-14: qty 1

## 2015-12-14 MED ORDER — OXYCODONE HCL 5 MG PO TABS: 5.0000 mg | ORAL_TABLET | Freq: Once | ORAL | Status: DC | PRN

## 2015-12-14 MED ORDER — LACTATED RINGERS IV SOLN
INTRAVENOUS | Status: DC
  Administered 2015-12-14: 16:00:00 via INTRAVENOUS

## 2015-12-14 MED ORDER — MORPHINE SULFATE (PF) 2 MG/ML IV SOLN
2.0000 mg | INTRAVENOUS | Status: DC | PRN
  Administered 2015-12-14 – 2015-12-16 (×7): 2 mg via INTRAVENOUS
  Filled 2015-12-14 (×8): qty 1

## 2015-12-14 MED ORDER — LACTATED RINGERS IR SOLN
Status: DC | PRN
  Administered 2015-12-14: 1000 mL

## 2015-12-14 MED ORDER — POTASSIUM CHLORIDE CRYS ER 10 MEQ PO TBCR
10.0000 meq | EXTENDED_RELEASE_TABLET | Freq: Every day | ORAL | Status: DC
  Administered 2015-12-14 – 2015-12-19 (×6): 10 meq via ORAL
  Filled 2015-12-14 (×6): qty 1

## 2015-12-14 MED ORDER — MIDAZOLAM HCL 5 MG/5ML IJ SOLN
INTRAMUSCULAR | Status: DC | PRN
  Administered 2015-12-14: 0.5 mg via INTRAVENOUS

## 2015-12-14 MED ORDER — ONDANSETRON 4 MG PO TBDP: 4.0000 mg | ORAL_TABLET | Freq: Four times a day (QID) | ORAL | Status: DC | PRN

## 2015-12-14 MED ORDER — ONDANSETRON HCL 4 MG/2ML IJ SOLN
INTRAMUSCULAR | Status: AC
  Filled 2015-12-14: qty 2

## 2015-12-14 MED ORDER — FENTANYL CITRATE (PF) 100 MCG/2ML IJ SOLN
INTRAMUSCULAR | Status: DC | PRN
  Administered 2015-12-14 (×2): 50 ug via INTRAVENOUS
  Administered 2015-12-14: 100 ug via INTRAVENOUS

## 2015-12-14 MED ORDER — PROMETHAZINE HCL 25 MG/ML IJ SOLN
6.2500 mg | INTRAMUSCULAR | Status: DC | PRN
  Administered 2015-12-14: 12.5 mg via INTRAVENOUS

## 2015-12-14 MED ORDER — SUGAMMADEX SODIUM 200 MG/2ML IV SOLN
INTRAVENOUS | Status: AC
  Filled 2015-12-14: qty 2

## 2015-12-14 MED ORDER — BUPIVACAINE-EPINEPHRINE 0.25% -1:200000 IJ SOLN
INTRAMUSCULAR | Status: DC | PRN
  Administered 2015-12-14: 28 mL

## 2015-12-14 MED ORDER — HYDROCODONE-ACETAMINOPHEN 5-325 MG PO TABS
1.0000 | ORAL_TABLET | ORAL | Status: DC | PRN
  Administered 2015-12-15 – 2015-12-16 (×4): 2 via ORAL
  Administered 2015-12-16 – 2015-12-18 (×7): 1 via ORAL
  Filled 2015-12-14 (×2): qty 1
  Filled 2015-12-14: qty 2
  Filled 2015-12-14 (×2): qty 1
  Filled 2015-12-14: qty 2
  Filled 2015-12-14 (×2): qty 1
  Filled 2015-12-14: qty 2
  Filled 2015-12-14: qty 1
  Filled 2015-12-14: qty 2

## 2015-12-14 MED ORDER — MIDAZOLAM HCL 2 MG/2ML IJ SOLN
INTRAMUSCULAR | Status: AC
  Filled 2015-12-14: qty 2

## 2015-12-14 MED ORDER — LEVOTHYROXINE SODIUM 150 MCG PO TABS
150.0000 ug | ORAL_TABLET | Freq: Every day | ORAL | Status: DC
  Administered 2015-12-15 – 2015-12-19 (×5): 150 ug via ORAL
  Filled 2015-12-14 (×6): qty 1

## 2015-12-14 MED ORDER — FLUTICASONE FUROATE-VILANTEROL 100-25 MCG/INH IN AEPB
1.0000 | INHALATION_SPRAY | Freq: Every day | RESPIRATORY_TRACT | Status: DC
  Administered 2015-12-15 – 2015-12-18 (×4): 1 via RESPIRATORY_TRACT
  Filled 2015-12-14 (×5): qty 1

## 2015-12-14 MED ORDER — LISINOPRIL-HYDROCHLOROTHIAZIDE 20-12.5 MG PO TABS: 2.0000 | ORAL_TABLET | Freq: Every day | ORAL | Status: DC

## 2015-12-14 MED ORDER — PROPOFOL 10 MG/ML IV BOLUS
INTRAVENOUS | Status: AC
  Filled 2015-12-14: qty 20

## 2015-12-14 MED ORDER — LIDOCAINE HCL (CARDIAC) 20 MG/ML IV SOLN
INTRAVENOUS | Status: DC | PRN
  Administered 2015-12-14: 50 mg via INTRAVENOUS

## 2015-12-14 MED ORDER — BUPIVACAINE-EPINEPHRINE (PF) 0.25% -1:200000 IJ SOLN
INTRAMUSCULAR | Status: AC
  Filled 2015-12-14: qty 30

## 2015-12-14 MED ORDER — SUGAMMADEX SODIUM 200 MG/2ML IV SOLN
INTRAVENOUS | Status: DC | PRN
  Administered 2015-12-14: 200 mg via INTRAVENOUS

## 2015-12-14 MED ORDER — PROPOFOL 10 MG/ML IV BOLUS
INTRAVENOUS | Status: DC | PRN
  Administered 2015-12-14: 80 mg via INTRAVENOUS

## 2015-12-14 MED ORDER — HYDROMORPHONE HCL 1 MG/ML IJ SOLN
0.2500 mg | INTRAMUSCULAR | Status: DC | PRN
  Administered 2015-12-14 (×2): 0.5 mg via INTRAVENOUS

## 2015-12-14 MED ORDER — ATORVASTATIN CALCIUM 80 MG PO TABS
80.0000 mg | ORAL_TABLET | Freq: Every day | ORAL | Status: DC
  Administered 2015-12-14 – 2015-12-19 (×6): 80 mg via ORAL
  Filled 2015-12-14 (×6): qty 1

## 2015-12-14 MED ORDER — PANTOPRAZOLE SODIUM 40 MG PO TBEC
40.0000 mg | DELAYED_RELEASE_TABLET | Freq: Every day | ORAL | Status: DC
  Administered 2015-12-15 – 2015-12-19 (×5): 40 mg via ORAL
  Filled 2015-12-14 (×5): qty 1

## 2015-12-14 MED ORDER — KCL IN DEXTROSE-NACL 20-5-0.45 MEQ/L-%-% IV SOLN
INTRAVENOUS | Status: DC
  Administered 2015-12-14 – 2015-12-19 (×5): via INTRAVENOUS
  Filled 2015-12-14 (×9): qty 1000

## 2015-12-14 MED ORDER — PHENYLEPHRINE HCL 10 MG/ML IJ SOLN
INTRAMUSCULAR | Status: DC | PRN
Start: 2015-12-14 — End: 2015-12-14
  Administered 2015-12-14: 80 ug via INTRAVENOUS

## 2015-12-14 MED ORDER — ONDANSETRON HCL 4 MG/2ML IJ SOLN
4.0000 mg | Freq: Four times a day (QID) | INTRAMUSCULAR | Status: DC | PRN
  Administered 2015-12-17 – 2015-12-18 (×3): 4 mg via INTRAVENOUS
  Filled 2015-12-14 (×3): qty 2

## 2015-12-14 MED ORDER — OXYCODONE HCL 5 MG/5ML PO SOLN
5.0000 mg | Freq: Once | ORAL | Status: DC | PRN
  Filled 2015-12-14: qty 5

## 2015-12-14 SURGICAL SUPPLY — 76 items
BLADE EXTENDED COATED 6.5IN (ELECTRODE) IMPLANT
BLADE HEX COATED 2.75 (ELECTRODE) ×4 IMPLANT
BLADE SURG 15 STRL LF DISP TIS (BLADE) ×1 IMPLANT
BLADE SURG 15 STRL SS (BLADE)
BLADE SURG SZ10 CARB STEEL (BLADE) ×3 IMPLANT
BRIEF STRETCH FOR OB PAD LRG (UNDERPADS AND DIAPERS) ×3 IMPLANT
CABLE HIGH FREQUENCY MONO STRZ (ELECTRODE) ×4 IMPLANT
COVER SURGICAL LIGHT HANDLE (MISCELLANEOUS) ×8 IMPLANT
DECANTER SPIKE VIAL GLASS SM (MISCELLANEOUS) IMPLANT
DEVICE SUT QUICK LOAD TK 5 (STAPLE) IMPLANT
DEVICE SUT TI-KNOT TK 5X26 (MISCELLANEOUS) IMPLANT
DEVICE TI KNOT TK5 (MISCELLANEOUS)
DRAPE CAMERA CLOSED 9X96 (DRAPES) ×1 IMPLANT
DRAPE LAPAROSCOPIC ABDOMINAL (DRAPES) IMPLANT
DRAPE LAPAROTOMY T 102X78X121 (DRAPES) ×3 IMPLANT
DRAPE LAPAROTOMY TRNSV 102X78 (DRAPE) ×3 IMPLANT
DRAPE WARM FLUID 44X44 (DRAPE) IMPLANT
DRSG PAD ABDOMINAL 8X10 ST (GAUZE/BANDAGES/DRESSINGS) IMPLANT
ELECT PENCIL ROCKER SW 15FT (MISCELLANEOUS) ×8 IMPLANT
ELECT REM PT RETURN 9FT ADLT (ELECTROSURGICAL) ×8
ELECTRODE REM PT RTRN 9FT ADLT (ELECTROSURGICAL) ×4 IMPLANT
GAUZE SPONGE 4X4 12PLY STRL (GAUZE/BANDAGES/DRESSINGS) IMPLANT
GAUZE SPONGE 4X4 16PLY XRAY LF (GAUZE/BANDAGES/DRESSINGS) ×3 IMPLANT
GLOVE BIO SURGEON STRL SZ 6.5 (GLOVE) ×8 IMPLANT
GLOVE BIO SURGEONS STRL SZ 6.5 (GLOVE) ×3
GLOVE BIOGEL PI IND STRL 7.0 (GLOVE) ×5 IMPLANT
GLOVE BIOGEL PI INDICATOR 7.0 (GLOVE) ×6
GLOVE ECLIPSE 8.0 STRL XLNG CF (GLOVE) ×1 IMPLANT
GLOVE INDICATOR 8.0 STRL GRN (GLOVE) ×2 IMPLANT
GOWN STRL REUS W/TWL 2XL LVL3 (GOWN DISPOSABLE) ×12 IMPLANT
KIT BASIN OR (CUSTOM PROCEDURE TRAY) ×8 IMPLANT
LEGGING LITHOTOMY PAIR STRL (DRAPES) ×4 IMPLANT
LUBRICANT JELLY K Y 4OZ (MISCELLANEOUS) ×8 IMPLANT
MARKER SKIN DUAL TIP RULER LAB (MISCELLANEOUS) ×4 IMPLANT
NEEDLE HYPO 22GX1.5 SAFETY (NEEDLE) ×7 IMPLANT
NS IRRIG 1000ML POUR BTL (IV SOLUTION) ×4 IMPLANT
PACK GENERAL/GYN (CUSTOM PROCEDURE TRAY) ×1 IMPLANT
PACK LITHOTOMY IV (CUSTOM PROCEDURE TRAY) ×4 IMPLANT
PLATFORM TRANSANAL ACCESS 4X5 (MISCELLANEOUS) ×1 IMPLANT
PLATFORM TRANSANAL ACCESS 4X5. (MISCELLANEOUS) ×4
QUICK LOAD TK 5 (STAPLE)
RETRACTOR STAY HOOK 5MM (MISCELLANEOUS) IMPLANT
RETRACTOR WILSON SYSTEM (INSTRUMENTS) IMPLANT
SCISSORS LAP 5X35 DISP (ENDOMECHANICALS) ×3 IMPLANT
SET IRRIG TUBING LAPAROSCOPIC (IRRIGATION / IRRIGATOR) ×4 IMPLANT
SHEARS HARMONIC ACE PLUS 36CM (ENDOMECHANICALS) ×4 IMPLANT
SOLUTION ANTI FOG 6CC (MISCELLANEOUS) ×1 IMPLANT
SPONGE LAP 18X18 X RAY DECT (DISPOSABLE) ×3 IMPLANT
STOPCOCK 4 WAY LG BORE MALE ST (IV SETS) ×4 IMPLANT
SUT CHROMIC 2 0 SH (SUTURE) IMPLANT
SUT CHROMIC 3 0 SH 27 (SUTURE) IMPLANT
SUT PDS AB 2-0 CT2 27 (SUTURE) ×16 IMPLANT
SUT PDS AB 3-0 SH 27 (SUTURE) ×8 IMPLANT
SUT SILK 2 0 (SUTURE)
SUT SILK 2 0 SH (SUTURE) ×3 IMPLANT
SUT SILK 2 0 SH CR/8 (SUTURE) IMPLANT
SUT SILK 2-0 18XBRD TIE 12 (SUTURE) IMPLANT
SUT SILK 3 0 SH 30 (SUTURE) IMPLANT
SUT SILK 3 0 SH CR/8 (SUTURE) IMPLANT
SUT V-LOC BARB 180 2/0GR6 GS22 (SUTURE)
SUT VIC AB 2-0 SH 18 (SUTURE) IMPLANT
SUT VIC AB 2-0 SH 27 (SUTURE) ×44
SUT VIC AB 2-0 SH 27X BRD (SUTURE) ×11 IMPLANT
SUT VIC AB 2-0 UR6 27 (SUTURE) IMPLANT
SUT VIC AB 3-0 SH 18 (SUTURE) IMPLANT
SUT VIC AB 3-0 SH 27 (SUTURE)
SUT VIC AB 3-0 SH 27XBRD (SUTURE) IMPLANT
SUT VIC AB 4-0 SH 18 (SUTURE) IMPLANT
SUTURE V-LC BRB 180 2/0GR6GS22 (SUTURE) ×2 IMPLANT
SYR 20CC LL (SYRINGE) ×7 IMPLANT
TOWEL OR 17X26 10 PK STRL BLUE (TOWEL DISPOSABLE) ×8 IMPLANT
TOWEL OR NON WOVEN STRL DISP B (DISPOSABLE) ×4 IMPLANT
TRAY FOLEY W/METER SILVER 14FR (SET/KITS/TRAYS/PACK) ×4 IMPLANT
TRAY FOLEY W/METER SILVER 16FR (SET/KITS/TRAYS/PACK) ×1 IMPLANT
TUBING INSUFFLATION 10FT LAP (TUBING) ×4 IMPLANT
YANKAUER SUCT BULB TIP 10FT TU (MISCELLANEOUS) ×8 IMPLANT

## 2015-12-14 NOTE — Anesthesia Postprocedure Evaluation (Signed)
Anesthesia Post Note  Patient: Meghan Anderson  Procedure(s) Performed: Procedure(s) (LRB): TRANSANAL ENDOSCOPIC MICROSURGERY OF RECTAL POLYP (N/A)  Patient location during evaluation: PACU Anesthesia Type: General Level of consciousness: awake and alert Pain management: pain level controlled Vital Signs Assessment: post-procedure vital signs reviewed and stable Respiratory status: spontaneous breathing, nonlabored ventilation, respiratory function stable and patient connected to nasal cannula oxygen Cardiovascular status: blood pressure returned to baseline and stable Postop Assessment: no signs of nausea or vomiting Anesthetic complications: no    Last Vitals:  Filed Vitals:   12/14/15 1610 12/14/15 1622  BP:  130/58  Pulse: 81 80  Temp:  36.6 C  Resp: 15 16    Last Pain:  Filed Vitals:   12/14/15 1625  PainSc: Asleep                 Anaid Haney A

## 2015-12-14 NOTE — Transfer of Care (Signed)
Immediate Anesthesia Transfer of Care Note  Patient: Meghan Anderson  Procedure(s) Performed: Procedure(s): TRANSANAL ENDOSCOPIC MICROSURGERY OF RECTAL POLYP (N/A)  Patient Location: PACU  Anesthesia Type:General  Level of Consciousness: sedated  Airway & Oxygen Therapy: Patient Spontanous Breathing and Patient connected to face mask oxygen  Post-op Assessment: Report given to RN and Post -op Vital signs reviewed and stable  Post vital signs: Reviewed and stable  Last Vitals:  Filed Vitals:   12/14/15 1111  BP: 157/88  Pulse: 93  Temp: 33.9 C  Resp: 16    Complications: No apparent anesthesia complications

## 2015-12-14 NOTE — Anesthesia Preprocedure Evaluation (Signed)
Anesthesia Evaluation  Patient identified by MRN, date of birth, ID band Patient awake    Reviewed: Allergy & Precautions, NPO status , Patient's Chart, lab work & pertinent test results  History of Anesthesia Complications (+) PONV  Airway Mallampati: I  TM Distance: >3 FB Neck ROM: Full    Dental  (+) Edentulous Upper, Edentulous Lower   Pulmonary COPD,  COPD inhaler, former smoker,    breath sounds clear to auscultation       Cardiovascular hypertension, Pt. on medications  Rhythm:Regular Rate:Normal     Neuro/Psych    GI/Hepatic GERD  Medicated and Controlled,  Endo/Other    Renal/GU      Musculoskeletal   Abdominal   Peds  Hematology   Anesthesia Other Findings   Reproductive/Obstetrics                             Anesthesia Physical Anesthesia Plan  ASA: III  Anesthesia Plan: General   Post-op Pain Management:    Induction: Intravenous  Airway Management Planned: Oral ETT  Additional Equipment:   Intra-op Plan:   Post-operative Plan: Extubation in OR  Informed Consent: I have reviewed the patients History and Physical, chart, labs and discussed the procedure including the risks, benefits and alternatives for the proposed anesthesia with the patient or authorized representative who has indicated his/her understanding and acceptance.   Dental advisory given  Plan Discussed with: CRNA, Anesthesiologist and Surgeon  Anesthesia Plan Comments:         Anesthesia Quick Evaluation

## 2015-12-14 NOTE — Interval H&P Note (Signed)
History and Physical Interval Note:  12/14/2015 12:15 PM  Meghan Anderson  has presented today for surgery, with the diagnosis of obstructing rectal mass  The various methods of treatment have been discussed with the patient and family. After consideration of risks, benefits and other options for treatment, the patient has consented to  Procedure(s): POSSIBLE LAPAROSCOPIC DIVERTING OSTOMY (N/A) TRANSANAL ENDOSCOPIC MICROSURGERY as a surgical intervention .  The patient's history has been reviewed, patient examined, no change in status, stable for surgery.  I have reviewed the patient's chart and labs.  Questions were answered to the patient's satisfaction.     Rosario Adie, MD  [facility]

## 2015-12-14 NOTE — Op Note (Signed)
12/14/2015  2:59 PM  PATIENT:  Meghan Anderson  80 y.o. female  Patient Care Team: [name], MD as PCP - General [name], MD as Consulting Physician (Ophthalmology) [name], MD as Consulting Physician (Gastroenterology)  PRE-OPERATIVE DIAGNOSIS:  obstructing rectal mass  POST-OPERATIVE DIAGNOSIS:  obstructing rectal mass  PROCEDURE:  TRANSANAL MINIMALLY INVASIVE SURGERY WITH EXCISION OF RECTAL POLYP    Surgeon(s): [name], MD  ASSISTANT: none   ANESTHESIA:   local and general  EBL:  Total I/O In: -  Out: 100 [Urine:100]  Delay start of Pharmacological VTE agent (>24hrs) due to surgical blood loss or risk of bleeding:  no  DRAINS: none   SPECIMEN:  Source of Specimen:  rectal mass  DISPOSITION OF SPECIMEN:  PATHOLOGY  COUNTS:  YES  PLAN OF CARE: Admit for overnight observation  PATIENT DISPOSITION:  PACU - hemodynamically stable.  INDICATION: 80 y.o. F with a distal rectal mass.  Pt unable to undergo colonoscopy or further workup of the mass  The anatomy & physiology of the digestive tract was discussed.  The pathophysiology of the rectal pathology was discussed.  Natural history risks without surgery was discussed.   I feel the risks of no intervention will lead to serious problems that outweigh the operative risks; therefore, I recommended surgery.    Laparoscopic & open abdominal techniques were discussed.  I recommended we start with a partial proctectomy by transanal endoscopic microsurgery (TEM) for excisional biopsy to remove the pathology and hopefully cure and/or control the pathology.  This technique can offer less operative risk and faster post-operative recovery.  Possible need for immediate or later abdominal surgery for further treatment was discussed.   Risks such as bleeding, abscess, reoperation, ostomy, heart attack, death, and other risks were discussed.   I noted a good likelihood this will help address the problem.   Goals of post-operative recovery were discussed as well.  We will work to minimize complications.  An educational handout was given as well.  Questions were answered.  The patient expresses understanding & wishes to proceed with surgery.  OR FINDINGS: posterior rectal mass, ~8 cm from anal verge  The resulting mass was ~5 cm in size  The closure rests 6cm from the anal verge in the posterior and R lateral location  DESCRIPTION: Informed consent was confirmed.  Patient received general anesthesia without difficulty.  Foley catheter sterilely placed.  Sequential compression devices active during the entire case.  The patient was placed in the lithotomy position, taking extra care to secure and protect the patient appropriately.  The perineum and perianal regions were prepped and draped in a sterile fashion.  Surgical timeout confirmed our plan.  DESCRIPTION: Informed consent was confirmed.  Patient received general anesthesia without difficulty.  Foley catheter sterilely placed.  Sequential compression devices active during the entire case.  The patient was placed in the prone position, taking extra care to secure and protect the patient appropriately.  The perineum and perianal regions were prepped and draped in a sterile fashion.  Surgical timeout confirmed our plan.  I did a gentle digital rectal examination with gradual anal dilation to allow placement of the gelpoint device.  This was secured to anal canal using a 2-0 suture.  We induced carbon dioxide insufflation intraluminally.  I oriented the scope and the patient such that the specimen rested towards the floor at the 6:00 position.  I could easily identify the mass.    I then did a  full thickness transection at the distal and R lateral margins.  I came around laterally towards the left Anderson using harmonic dissection.  I gradually came more proximally. Due to the size of this mass, I was not able to transect the remaining margin.  The mucosa was  very mobile.  I removed the gelpoint and pulled the mass out of the anal canal and finished tansecting the proximal margin.  I ensured hemostasis.  The specimen was sent to pathology.  We were unable to maintain insufflation due to pressure in the abdomen.  A varies needle was placed in the lower abdomen with a rush of air noted.  The abdomen was desufflated but I was still unable to obtain insufflation in the rectum.  I inverted the mucosa and closed the rectal excision site using 2 running 2-0 Vicryl sutures.  I confirmed patency using a rigid sigmoidoscopy.  THe defect appeared closed and hemostatic.  A rectal block was placed using Marciane with epinephrine.  All counts were correct per OR staff.  The patient was sent to the PACU in stable condition.

## 2015-12-14 NOTE — H&P (View-Only) (Signed)
History of Present Illness [name] MD; [id number] 12:13 PM) Patient words: rectal mass.  The patient is a 80 year old female who presents with a colorectal polyp. Patient underwent colonoscopy due to rectal bleeding and no onset constipation. A rectal mass was noted in her distal rectum and this was unable to be traversed with the colonoscope. Biopsies show only adenoma. She is taking laxatives on a daily basis to have bowel movements. On her CT scan she appears to have a large rectal polyp with some intussusception. Her CEA level is slightly elevated. CT abdomen shows the rectal wall thickening and some borderline lymphadenopathy. There are 4 small liver lesions that are too small to be characterized currently. The patient has a history of COPD which is managed with broncodilators and steroid inhalers. She was seen by Dr. Whitney Muse and it was felt that she is unable to go through any further workup or treatment due to the obstructive nature of her tumor.   Other Problems Mammie Lorenzo, LPN; 579FGE [id number] AM) Asthma Chronic Obstructive Lung Disease High blood pressure Hypercholesterolemia Thyroid Disease  Past Surgical History Mammie Lorenzo, LPN; 579FGE [id number] AM) Hysterectomy (not due to cancer) - Complete  Diagnostic Studies History Mammie Lorenzo, LPN; 579FGE [id number] AM) Colonoscopy within last year Mammogram 1-3 years ago Pap Smear >5 years ago  Allergies Marjean Donna, CMA; 11/21/2015 9:46 AM) PredniSONE (Pak) *CORTICOSTEROIDS*  Medication History Marjean Donna, CMA; 11/21/2015 9:47 AM) Albuterol Sulfate ((2.5 MG/3ML)0.083% Nebulized Soln, Inhalation) Active. Anucort-HC (25MG  Suppository, Rectal) Active. Atorvastatin Calcium (20MG  Tablet, Oral) Active. Spiriva HandiHaler (18MCG Capsule, Inhalation) Active. PredniSONE (20MG  Tablet, Oral) Active. Levothyroxine Sodium (150MCG Tablet, Oral) Active. Medications Reconciled  Social History Mammie Lorenzo, LPN; 579FGE [id number] AM) Caffeine use Coffee, Tea. No alcohol use No drug use Tobacco use Former smoker.  Family History Mammie Lorenzo, LPN; 579FGE [id number] AM) Arthritis Daughter, Son. Cancer Brother. Colon Polyps Daughter. Diabetes Mellitus Sister. Heart Disease Brother, Daughter, Sister, Son. Heart disease in female family member before age 77 Heart disease in female family member before age 46 Kidney Disease Mother. Respiratory Condition Brother, Father. Thyroid problems Sister.  Pregnancy / Birth History Mammie Lorenzo, LPN; 579FGE [id number] AM) Durenda Age 5 Maternal age 35-20 Para 4     Review of Systems Mammie Lorenzo LPN; 579FGE [id number] AM) General Present- Appetite Loss and Weight Loss. Not Present- Chills, Fatigue, Fever, Night Sweats and Weight Gain. Skin Not Present- Change in Wart/Mole, Dryness, Hives, Jaundice, New Lesions, Non-Healing Wounds, Rash and Ulcer. HEENT Not Present- Earache, Hearing Loss, Hoarseness, Nose Bleed, Oral Ulcers, Ringing in the Ears, Seasonal Allergies, Sinus Pain, Sore Throat, Visual Disturbances, Wears glasses/contact lenses and Yellow Eyes. Respiratory Present- Difficulty Breathing. Not Present- Bloody sputum, Chronic Cough, Snoring and Wheezing. Breast Not Present- Breast Mass, Breast Pain, Nipple Discharge and Skin Changes. Gastrointestinal Present- Bloody Stool and Constipation. Not Present- Abdominal Pain, Bloating, Change in Bowel Habits, Chronic diarrhea, Difficulty Swallowing, Excessive gas, Gets full quickly at meals, Hemorrhoids, Indigestion, Nausea, Rectal Pain and Vomiting. Female Genitourinary Not Present- Frequency, Nocturia, Painful Urination, Pelvic Pain and Urgency. Musculoskeletal Not Present- Back Pain, Joint Pain, Joint Stiffness, Muscle Pain, Muscle Weakness and Swelling of Extremities. Neurological Not Present- Decreased Memory, Fainting, Headaches, Numbness, Seizures, Tingling, Tremor, Trouble  walking and Weakness. Psychiatric Not Present- Anxiety, Bipolar, Change in Sleep Pattern, Depression, Fearful and Frequent crying. Endocrine Not Present- Cold Intolerance, Excessive Hunger, Hair Changes, Heat Intolerance, Hot flashes and New Diabetes. Hematology Not Present- Easy Bruising, Excessive bleeding, Gland problems, HIV and  Persistent Infections.  Vitals (Sonya Bynum CMA; 11/21/2015 9:47 AM) 11/21/2015 9:47 AM Weight: 124 lb Height: 64in Body Surface Area: 1.6 m Body Mass Index: 21.28 kg/m  Temp.: 97.59F(Temporal)  Pulse: 88 (Regular)  BP: 144/80 (Sitting, Left Arm, Standard)      Physical Exam [name] MD; [id number] 12:15 PM)  General Mental Status-Alert. General Appearance-Not in acute distress. Build & Nutrition-Well nourished. Posture-Normal posture. Gait-Normal.  Head and Neck Head-normocephalic, atraumatic with no lesions or palpable masses. Trachea-midline.  Chest and Lung Exam Chest and lung exam reveals -on auscultation, normal breath sounds, no adventitious sounds and normal vocal resonance.  Cardiovascular Cardiovascular examination reveals -normal heart sounds, regular rate and rhythm with no murmurs and femoral artery auscultation bilaterally reveals normal pulses, no bruits, no thrills.  Abdomen Inspection Inspection of the abdomen reveals - No Hernias. Palpation/Percussion Palpation and Percussion of the abdomen reveal - Soft, Non Tender, No Rigidity (guarding), No hepatosplenomegaly and No Palpable abdominal masses.  Rectal Note: Palpable mass appearing to arise posteriorly, does not appear to be fixed. Middle of mass appears to be hardened and is concerning for invasive carcinoma.  Neurologic Neurologic evaluation reveals -alert and oriented x 3 with no impairment of recent or remote memory, normal attention span and ability to concentrate, normal sensation and normal  coordination.  Musculoskeletal Normal Exam - Bilateral-Upper Extremity Strength Normal and Lower Extremity Strength Normal.    Assessment & Plan [name] MD; [id number] 12:16 PM)  RECTAL MASS (K62.9) Impression: 80 year old female with a large rectal mass causing obstruction who presents to the office as a referral from Dr. Whitney Muse. On exam she has a large mobile mass in the distal rectum. I believe this can be removed (at the least partially) via a transanal excision to help assist with biopsy. I think this will also help relieve some of her obstructive symptoms. If for some reason, we cannot get this accomplished in the operating room, we will plan on doing a diverting colostomy/Hartmann procedure. The surgery and anatomy were described to the patient as well as the risks of surgery and the possible complications. These include: Bleeding, deep abdominal infections and possible wound complications such as hernia and infection, damage to adjacent structures, leak of surgical connections, which can lead to other surgeries and possibly an ostomy, possible need for other procedures, such as abscess drains in radiology, possible prolonged hospital stay, possible diarrhea from removal of part of the colon, possible constipation from narcotics, possible bowel, bladder or sexual dysfunction if having rectal surgery, prolonged fatigue/weakness or appetite loss, possible early recurrence of of disease, possible complications of their medical problems such as heart disease or arrhythmias or lung problems, death (less than 1%). I believe the patient understands and wishes to proceed with the surgery.

## 2015-12-15 MED ORDER — ACETAMINOPHEN 325 MG PO TABS
650.0000 mg | ORAL_TABLET | Freq: Four times a day (QID) | ORAL | Status: DC | PRN
  Administered 2015-12-15 – 2015-12-19 (×3): 650 mg via ORAL
  Filled 2015-12-15 (×3): qty 2

## 2015-12-15 MED ORDER — ALBUTEROL SULFATE (2.5 MG/3ML) 0.083% IN NEBU
2.5000 mg | INHALATION_SOLUTION | Freq: Two times a day (BID) | RESPIRATORY_TRACT | Status: DC
  Administered 2015-12-16 – 2015-12-19 (×7): 2.5 mg via RESPIRATORY_TRACT
  Filled 2015-12-15 (×7): qty 3

## 2015-12-15 NOTE — Care Management Obs Status (Signed)
[facility] NOTIFICATION   Patient Details  Name: Meghan Anderson MRN: [id number] Date of Birth: 09-Dec-1935   Medicare Observation Status Notification Given:  Yes    Guadalupe Maple, RN 12/15/2015, 2:54 PM

## 2015-12-15 NOTE — Progress Notes (Signed)
1 Day Post-Op transanal excision of rectal mass Subjective: C/o LLQ pain, low grade fevers overnight  Objective: Vital signs in last 24 hours: Temp:  [93 F (33.9 C)-100.1 F (37.8 C)] 99.2 F (37.3 C) (02/03 0527) Pulse Rate:  [69-100] 85 (02/03 0527) Resp:  [14-21] 16 (02/03 0527) BP: (108-157)/(43-88) 109/55 mmHg (02/03 0527) SpO2:  [92 %-100 %] 98 % (02/03 0527) Weight:  [56.654 kg (124 lb 14.4 oz)] 56.654 kg (124 lb 14.4 oz) (02/02 1153)   Intake/Output from previous day: 02/02 0701 - 02/03 0700 In: 1225 [I.V.:1225] Out: 100 [Urine:100] Intake/Output this shift:     General appearance: alert and cooperative GI: TTP in SP and LLQ regions  Incision: no significant drainage  Lab Results:  No results for input(s): WBC, HGB, HCT, PLT in the last 72 hours. BMET No results for input(s): NA, K, CL, CO2, GLUCOSE, BUN, CREATININE, CALCIUM in the last 72 hours. PT/INR No results for input(s): LABPROT, INR in the last 72 hours. ABG No results for input(s): PHART, HCO3 in the last 72 hours.  Invalid input(s): PCO2, PO2  MEDS, Scheduled . atorvastatin  80 mg Oral Daily  . enoxaparin (LOVENOX) injection  40 mg Subcutaneous Q24H  . ertapenem  1 g Intravenous Q24H  . Fluticasone Furoate-Vilanterol  1 puff Inhalation Daily  . lisinopril  40 mg Oral Daily   And  . hydrochlorothiazide  25 mg Oral Daily  . levothyroxine  150 mcg Oral QAC breakfast  . pantoprazole  40 mg Oral Daily  . polyethylene glycol  17 g Oral Daily  . potassium chloride  10 mEq Oral Daily  . senna-docusate  1 tablet Oral QHS  . tiotropium  18 mcg Inhalation Daily    Studies/Results: No results found.  Assessment: s/p Procedure(s): TRANSANAL ENDOSCOPIC MICROSURGERY OF RECTAL POLYP Patient Active Problem List   Diagnosis Date Noted  . Rectal mass   . Constipation 10/23/2015  . Loss of weight 10/23/2015  . Rectal bleeding 10/23/2015  . Hypothyroidism 06/29/2015  . Thoracic aorta atherosclerosis  ([place]) 08/29/2014  . COPD (chronic obstructive pulmonary disease) ([place]) 07/29/2014  . GERD (gastroesophageal reflux disease) 07/29/2014  . Essential hypertension, benign 07/29/2014  . Heart murmur 07/29/2014  . Arthritis 07/29/2014  . Osteopenia 07/29/2014  . Hyperlipidemia 07/29/2014      Plan: Advance diet Will continue to monitor pt today.  Pain most likely due to rectal perforation (intentional).  Will cont IV abx until pain is better.  Check WBC in AM.  Cont low dose fluids.      Rosario Adie, [facility], [place]   12/15/2015 8:08 AM

## 2015-12-16 ENCOUNTER — Encounter (HOSPITAL_COMMUNITY): Payer: Self-pay | Admitting: Hematology & Oncology

## 2015-12-16 LAB — BASIC METABOLIC PANEL
ANION GAP: 9 (ref 5–15)
BUN: 27 mg/dL — ABNORMAL HIGH (ref 6–20)
CALCIUM: 8.1 mg/dL — AB (ref 8.9–10.3)
CO2: 24 mmol/L (ref 22–32)
Chloride: 106 mmol/L (ref 101–111)
Creatinine, Ser: 1.57 mg/dL — ABNORMAL HIGH (ref 0.44–1.00)
GFR, EST AFRICAN AMERICAN: 36 mL/min — AB (ref >60)
GFR, EST NON AFRICAN AMERICAN: 31 mL/min — AB (ref >60)
Glucose, Bld: 139 mg/dL — ABNORMAL HIGH (ref 65–99)
POTASSIUM: 3.8 mmol/L (ref 3.5–5.1)
SODIUM: 139 mmol/L (ref 135–145)

## 2015-12-16 LAB — CBC
HCT: 32 % — ABNORMAL LOW (ref 36.0–46.0)
Hemoglobin: 10 g/dL — ABNORMAL LOW (ref 12.0–15.0)
MCH: 29.8 pg (ref 26.0–34.0)
MCHC: 31.3 g/dL (ref 30.0–36.0)
MCV: 95.2 fL (ref 78.0–100.0)
PLATELETS: 207 10*3/uL (ref 150–400)
RBC: 3.36 MIL/uL — AB (ref 3.87–5.11)
RDW: 15.9 % — AB (ref 11.5–15.5)
WBC: 12 10*3/uL — AB (ref 4.0–10.5)

## 2015-12-16 NOTE — Addendum Note (Signed)
Addended by: Patrici Ranks on: 12/16/2015 06:04 PM   Modules accepted: Level of Service

## 2015-12-16 NOTE — Progress Notes (Signed)
2 Days Post-Op transanal excision of rectal mass Subjective: C/o lower abd pain, no fevers, vitals stable  Objective: Vital signs in last 24 hours: Temp:  [98.1 F (36.7 C)-99.7 F (37.6 C)] 98.9 F (37.2 C) (02/04 0523) Pulse Rate:  [81-93] 89 (02/04 0523) Resp:  [14-16] 16 (02/04 0523) BP: (106-137)/(44-64) 137/64 mmHg (02/04 0523) SpO2:  [94 %-99 %] 98 % (02/04 0523)   Intake/Output from previous day: 02/03 0701 - 02/04 0700 In: 650 [P.O.:290; I.V.:360] Out: -  Intake/Output this shift:    General appearance: alert and cooperative GI: TTP in SP and RLQ regions. Somewhat distended  Incision: no significant drainage  Lab Results:  No results for input(s): WBC, HGB, HCT, PLT in the last 72 hours. BMET No results for input(s): NA, K, CL, CO2, GLUCOSE, BUN, CREATININE, CALCIUM in the last 72 hours. PT/INR No results for input(s): LABPROT, INR in the last 72 hours. ABG No results for input(s): PHART, HCO3 in the last 72 hours.  Invalid input(s): PCO2, PO2  MEDS, Scheduled . albuterol  2.5 mg Nebulization BID  . atorvastatin  80 mg Oral Daily  . enoxaparin (LOVENOX) injection  40 mg Subcutaneous Q24H  . ertapenem  1 g Intravenous Q24H  . Fluticasone Furoate-Vilanterol  1 puff Inhalation Daily  . lisinopril  40 mg Oral Daily   And  . hydrochlorothiazide  25 mg Oral Daily  . levothyroxine  150 mcg Oral QAC breakfast  . pantoprazole  40 mg Oral Daily  . polyethylene glycol  17 g Oral Daily  . potassium chloride  10 mEq Oral Daily  . senna-docusate  1 tablet Oral QHS  . tiotropium  18 mcg Inhalation Daily    Studies/Results: No results found.  Assessment: s/p Procedure(s): TRANSANAL ENDOSCOPIC MICROSURGERY OF RECTAL POLYP Patient Active Problem List   Diagnosis Date Noted  . Rectal mass   . Constipation 10/23/2015  . Loss of weight 10/23/2015  . Rectal bleeding 10/23/2015  . Hypothyroidism 06/29/2015  . Thoracic aorta atherosclerosis ([place]) 08/29/2014  .  COPD (chronic obstructive pulmonary disease) ([place]) 07/29/2014  . GERD (gastroesophageal reflux disease) 07/29/2014  . Essential hypertension, benign 07/29/2014  . Heart murmur 07/29/2014  . Arthritis 07/29/2014  . Osteopenia 07/29/2014  . Hyperlipidemia 07/29/2014      Plan: Advance diet Will continue to monitor pt today.  Pain most likely due to rectal perforation (intentional).  Will cont IV abx until pain is better.  Recheck WBC in AM.  Cont low dose fluids.      Rosario Adie, [facility], [place]   12/16/2015 7:53 AM

## 2015-12-16 NOTE — Progress Notes (Signed)
PT has BREO from home and states she will take it this morning.

## 2015-12-17 DIAGNOSIS — Z87891 Personal history of nicotine dependence: Secondary | ICD-10-CM | POA: Diagnosis not present

## 2015-12-17 DIAGNOSIS — I10 Essential (primary) hypertension: Secondary | ICD-10-CM | POA: Diagnosis present

## 2015-12-17 DIAGNOSIS — K629 Disease of anus and rectum, unspecified: Secondary | ICD-10-CM | POA: Diagnosis not present

## 2015-12-17 DIAGNOSIS — E039 Hypothyroidism, unspecified: Secondary | ICD-10-CM | POA: Diagnosis present

## 2015-12-17 DIAGNOSIS — G8918 Other acute postprocedural pain: Secondary | ICD-10-CM | POA: Diagnosis not present

## 2015-12-17 DIAGNOSIS — Z7952 Long term (current) use of systemic steroids: Secondary | ICD-10-CM | POA: Diagnosis not present

## 2015-12-17 DIAGNOSIS — Z90710 Acquired absence of both cervix and uterus: Secondary | ICD-10-CM | POA: Diagnosis not present

## 2015-12-17 DIAGNOSIS — C20 Malignant neoplasm of rectum: Secondary | ICD-10-CM | POA: Diagnosis present

## 2015-12-17 DIAGNOSIS — J45909 Unspecified asthma, uncomplicated: Secondary | ICD-10-CM | POA: Diagnosis present

## 2015-12-17 DIAGNOSIS — Z79899 Other long term (current) drug therapy: Secondary | ICD-10-CM | POA: Diagnosis not present

## 2015-12-17 DIAGNOSIS — J449 Chronic obstructive pulmonary disease, unspecified: Secondary | ICD-10-CM | POA: Diagnosis present

## 2015-12-17 DIAGNOSIS — Z01812 Encounter for preprocedural laboratory examination: Secondary | ICD-10-CM | POA: Diagnosis not present

## 2015-12-17 DIAGNOSIS — K219 Gastro-esophageal reflux disease without esophagitis: Secondary | ICD-10-CM | POA: Diagnosis present

## 2015-12-17 LAB — BASIC METABOLIC PANEL
Anion gap: 7 (ref 5–15)
BUN: 25 mg/dL — ABNORMAL HIGH (ref 6–20)
CALCIUM: 7.9 mg/dL — AB (ref 8.9–10.3)
CO2: 24 mmol/L (ref 22–32)
CREATININE: 1.44 mg/dL — AB (ref 0.44–1.00)
Chloride: 108 mmol/L (ref 101–111)
GFR calc Af Amer: 39 mL/min — ABNORMAL LOW (ref >60)
GFR calc non Af Amer: 34 mL/min — ABNORMAL LOW (ref >60)
GLUCOSE: 120 mg/dL — AB (ref 65–99)
Potassium: 4 mmol/L (ref 3.5–5.1)
Sodium: 139 mmol/L (ref 135–145)

## 2015-12-17 LAB — CBC
HCT: 27.5 % — ABNORMAL LOW (ref 36.0–46.0)
Hemoglobin: 8.6 g/dL — ABNORMAL LOW (ref 12.0–15.0)
MCH: 29.5 pg (ref 26.0–34.0)
MCHC: 31.3 g/dL (ref 30.0–36.0)
MCV: 94.2 fL (ref 78.0–100.0)
PLATELETS: 177 10*3/uL (ref 150–400)
RBC: 2.92 MIL/uL — AB (ref 3.87–5.11)
RDW: 15.7 % — AB (ref 11.5–15.5)
WBC: 9.1 10*3/uL (ref 4.0–10.5)

## 2015-12-17 NOTE — Progress Notes (Signed)
3 Days Post-Op transanal excision of rectal mass Subjective: Pain a little better, still having some intermittent nausea.  No fevers.  Objective: Vital signs in last 24 hours: Temp:  [97.6 F (36.4 C)-99 F (37.2 C)] 97.6 F (36.4 C) (02/05 0607) Pulse Rate:  [86-93] 92 (02/05 0607) Resp:  [16-20] 18 (02/05 0607) BP: (112-139)/(55-95) 139/95 mmHg (02/05 0607) SpO2:  [97 %-98 %] 97 % (02/05 0607)   Intake/Output from previous day: 02/04 0701 - 02/05 0700 In: 815 [P.O.:60; I.V.:705; IV Piggyback:50] Out: 750 [Urine:750] Intake/Output this shift:    General appearance: alert and cooperative GI: TTP in SP region. Less distended Lungs: wheezing present  Lab Results:   Recent Labs  12/16/15 0855 12/17/15 0528  WBC 12.0* 9.1  HGB 10.0* 8.6*  HCT 32.0* 27.5*  PLT 207 177   BMET  Recent Labs  12/16/15 0855 12/17/15 0528  NA 139 139  K 3.8 4.0  CL 106 108  CO2 24 24  GLUCOSE 139* 120*  BUN 27* 25*  CREATININE 1.57* 1.44*  CALCIUM 8.1* 7.9*   PT/INR No results for input(s): LABPROT, INR in the last 72 hours. ABG No results for input(s): PHART, HCO3 in the last 72 hours.  Invalid input(s): PCO2, PO2  MEDS, Scheduled . albuterol  2.5 mg Nebulization BID  . atorvastatin  80 mg Oral Daily  . enoxaparin (LOVENOX) injection  40 mg Subcutaneous Q24H  . ertapenem  1 g Intravenous Q24H  . Fluticasone Furoate-Vilanterol  1 puff Inhalation Daily  . lisinopril  40 mg Oral Daily   And  . hydrochlorothiazide  25 mg Oral Daily  . levothyroxine  150 mcg Oral QAC breakfast  . pantoprazole  40 mg Oral Daily  . polyethylene glycol  17 g Oral Daily  . potassium chloride  10 mEq Oral Daily  . senna-docusate  1 tablet Oral QHS  . tiotropium  18 mcg Inhalation Daily    Studies/Results: No results found.  Assessment: s/p Procedure(s): TRANSANAL ENDOSCOPIC MICROSURGERY OF RECTAL POLYP Patient Active Problem List   Diagnosis Date Noted  . Rectal mass   . Constipation  10/23/2015  . Loss of weight 10/23/2015  . Rectal bleeding 10/23/2015  . Hypothyroidism 06/29/2015  . Thoracic aorta atherosclerosis ([place]) 08/29/2014  . COPD (chronic obstructive pulmonary disease) ([place]) 07/29/2014  . GERD (gastroesophageal reflux disease) 07/29/2014  . Essential hypertension, benign 07/29/2014  . Heart murmur 07/29/2014  . Arthritis 07/29/2014  . Osteopenia 07/29/2014  . Hyperlipidemia 07/29/2014      Plan: Cont diet as tolerated Will continue to monitor pt today.  Pain most likely due to rectal perforation (intentional) and is getting better.  Will cont IV abx until pain is better.  Recheck labs in AM.  Appears to be improving slowly.  Cont low dose fluids.      Rosario Adie, [facility], [place]   12/17/2015 8:18 AM

## 2015-12-18 LAB — CBC
HEMATOCRIT: 29.5 % — AB (ref 36.0–46.0)
HEMOGLOBIN: 9.1 g/dL — AB (ref 12.0–15.0)
MCH: 29.1 pg (ref 26.0–34.0)
MCHC: 30.8 g/dL (ref 30.0–36.0)
MCV: 94.2 fL (ref 78.0–100.0)
Platelets: 229 10*3/uL (ref 150–400)
RBC: 3.13 MIL/uL — ABNORMAL LOW (ref 3.87–5.11)
RDW: 15.8 % — ABNORMAL HIGH (ref 11.5–15.5)
WBC: 6.7 10*3/uL (ref 4.0–10.5)

## 2015-12-18 LAB — BASIC METABOLIC PANEL
ANION GAP: 9 (ref 5–15)
BUN: 16 mg/dL (ref 6–20)
CHLORIDE: 109 mmol/L (ref 101–111)
CO2: 23 mmol/L (ref 22–32)
Calcium: 8.3 mg/dL — ABNORMAL LOW (ref 8.9–10.3)
Creatinine, Ser: 1.14 mg/dL — ABNORMAL HIGH (ref 0.44–1.00)
GFR calc Af Amer: 52 mL/min — ABNORMAL LOW (ref >60)
GFR, EST NON AFRICAN AMERICAN: 45 mL/min — AB (ref >60)
GLUCOSE: 135 mg/dL — AB (ref 65–99)
POTASSIUM: 4.4 mmol/L (ref 3.5–5.1)
Sodium: 141 mmol/L (ref 135–145)

## 2015-12-18 NOTE — Progress Notes (Signed)
4 Days Post-Op transanal excision of rectal mass Subjective: Pain getting better, still having some intermittent nausea.  No fevers.  Having watery BM's  Objective: Vital signs in last 24 hours: Temp:  [98.3 F (36.8 C)-98.8 F (37.1 C)] 98.8 F (37.1 C) (02/06 0426) Pulse Rate:  [90-95] 90 (02/06 0426) Resp:  [18] 18 (02/06 0426) BP: (132-158)/(64-90) 158/90 mmHg (02/06 0426) SpO2:  [92 %-99 %] 96 % (02/06 0426)   Intake/Output from previous day: 02/05 0701 - 02/06 0700 In: 1713 [I.V.:1713] Out: 675 [Urine:675] Intake/Output this shift:    General appearance: alert and cooperative GI: less TTP in SP region. Not distended Lungs: less wheezing present  Lab Results:   Recent Labs  12/17/15 0528 12/18/15 0540  WBC 9.1 6.7  HGB 8.6* 9.1*  HCT 27.5* 29.5*  PLT 177 229   BMET  Recent Labs  12/17/15 0528 12/18/15 0540  NA 139 141  K 4.0 4.4  CL 108 109  CO2 24 23  GLUCOSE 120* 135*  BUN 25* 16  CREATININE 1.44* 1.14*  CALCIUM 7.9* 8.3*   PT/INR No results for input(s): LABPROT, INR in the last 72 hours. ABG No results for input(s): PHART, HCO3 in the last 72 hours.  Invalid input(s): PCO2, PO2  MEDS, Scheduled . albuterol  2.5 mg Nebulization BID  . atorvastatin  80 mg Oral Daily  . enoxaparin (LOVENOX) injection  40 mg Subcutaneous Q24H  . ertapenem  1 g Intravenous Q24H  . Fluticasone Furoate-Vilanterol  1 puff Inhalation Daily  . lisinopril  40 mg Oral Daily   And  . hydrochlorothiazide  25 mg Oral Daily  . levothyroxine  150 mcg Oral QAC breakfast  . pantoprazole  40 mg Oral Daily  . potassium chloride  10 mEq Oral Daily  . senna-docusate  1 tablet Oral QHS  . tiotropium  18 mcg Inhalation Daily    Studies/Results: No results found.  Assessment: s/p Procedure(s): TRANSANAL ENDOSCOPIC MICROSURGERY OF RECTAL POLYP Patient Active Problem List   Diagnosis Date Noted  . Rectal mass   . Constipation 10/23/2015  . Loss of weight 10/23/2015   . Rectal bleeding 10/23/2015  . Hypothyroidism 06/29/2015  . Thoracic aorta atherosclerosis ([place]) 08/29/2014  . COPD (chronic obstructive pulmonary disease) ([place]) 07/29/2014  . GERD (gastroesophageal reflux disease) 07/29/2014  . Essential hypertension, benign 07/29/2014  . Heart murmur 07/29/2014  . Arthritis 07/29/2014  . Osteopenia 07/29/2014  . Hyperlipidemia 07/29/2014      Plan: Cont diet as tolerated Will continue to monitor pt today.  Pain most likely due to rectal perforation (intentional) and is getting better.  Will cont IV abx until pain is better.  Appears to be improving slowly.  Cont low dose fluids.  D/C Miralax since she is having loose BM's.  LOS: 1 day     .Rosario Adie, [facility], [place] [phone]   12/18/2015 7:29 AM

## 2015-12-19 MED ORDER — POLYETHYLENE GLYCOL 3350 17 GM/SCOOP PO POWD: 17.0000 g | Freq: Two times a day (BID) | ORAL | Status: DC | PRN

## 2015-12-19 MED ORDER — HYDROCODONE-ACETAMINOPHEN 5-325 MG PO TABS: 1.0000 | ORAL_TABLET | ORAL | Status: DC | PRN

## 2015-12-19 MED ORDER — AMOXICILLIN-POT CLAVULANATE 875-125 MG PO TABS
1.0000 | ORAL_TABLET | Freq: Two times a day (BID) | ORAL | Status: DC
  Filled 2015-12-19 (×2): qty 1

## 2015-12-19 MED ORDER — AMOXICILLIN-POT CLAVULANATE 875-125 MG PO TABS: 1.0000 | ORAL_TABLET | Freq: Two times a day (BID) | ORAL | Status: DC

## 2015-12-19 NOTE — Discharge Instructions (Addendum)
POST OP INSTRUCTIONS  1. DIET: Follow a light bland diet the first 24 hours after arrival home, such as soup, liquids, crackers, etc.  Be sure to include lots of fluids daily.  Avoid fast food or heavy meals as your are more likely to get nauseated.  Eat a low fat the next few days after surgery.   2. Take your usually prescribed home medications unless otherwise directed. 3. PAIN CONTROL: a. Pain is best controlled by a usual combination of three different methods TOGETHER: i. Ice/Heat ii. Over the counter pain medication iii. Prescription pain medication b. It is helpful to take an over-the-counter pain medication regularly for the first few weeks.  Choose one of the following that works best for you: i. Naproxen (Aleve, etc)  Two 220mg  tabs twice a day ii. Ibuprofen (Advil, etc) Three 200mg  tabs four times a day (every meal & bedtime) iii. Acetaminophen (Tylenol, etc) 500-650mg  four times a day (every meal & bedtime) c. A  prescription for pain medication (such as oxycodone, hydrocodone, etc) should be given to you upon discharge.  Take your pain medication as prescribed.  i. If you are having problems/concerns with the prescription medicine (does not control pain, nausea, vomiting, rash, itching, etc), please call us [phone] to see if we need to switch you to a different pain medicine that will work better for you and/or control your side effect better. ii. If you need a refill on your pain medication, please contact your pharmacy.  They will contact our office to request authorization. Prescriptions will not be filled after 5 pm or on week-ends. 4. Avoid getting constipated.  Between the surgery and the pain medications, it is common to experience some constipation.  Increasing fluid intake and taking a fiber supplement (such as Metamucil, Citrucel, FiberCon, MiraLax, etc) 1-2 times a day regularly will usually help prevent this problem from occurring.  A mild laxative (prune juice, Milk  of Magnesia, MiraLax, etc) should be taken according to package directions if there are no bowel movements after 48 hours.   5. Watch out for diarrhea.  If you have many loose bowel movements, simplify your diet to bland foods & liquids for a few days.  Stop any stool softeners and decrease your fiber supplement.  Switching to mild anti-diarrheal medications (Kayopectate, Pepto Bismol) can help.  If this worsens or does not improve, please call us. 6. Wash / shower every day.  You may shower over the incision / wound.  Avoid baths until the skin is fully healed.  Continue to shower over incision(s) after the dressing is off. 7. ACTIVITIES as tolerated:   a. You may resume regular (light) daily activities beginning the next day--such as daily self-care, walking, climbing stairs--gradually increasing activities as tolerated.  If you can walk 30 minutes without difficulty, it is safe to try more intense activity such as jogging, treadmill, bicycling, low-impact aerobics, swimming, etc. b. Save the most intensive and strenuous activity for last such as sit-ups, heavy lifting, contact sports, etc  Refrain from any heavy lifting or straining until you are off narcotics for pain control.   c. DO NOT PUSH THROUGH PAIN.  Let pain be your guide: If it hurts to do something, don't do it.  Pain is your body warning you to avoid that activity for another week until the pain goes down. d. You may drive when you are no longer taking prescription pain medication, you can comfortably wear a seatbelt, and you can safely maneuver  your car and apply brakes. e. Dennis Bast may have sexual intercourse when it is comfortable.  8. FOLLOW UP in our office a. Please call [facility] at [phone] to set up an appointment to see your surgeon in the office for a follow-up appointment approximately 1-2 weeks after your surgery. b. Make sure that you call for this appointment the day you arrive home to insure a convenient appointment  time. 10. IF YOU HAVE DISABILITY OR FAMILY LEAVE FORMS, BRING THEM TO THE OFFICE FOR PROCESSING.  DO NOT GIVE THEM TO YOUR DOCTOR.   WHEN TO CALL us [phone]: 1. Poor pain control 2. Reactions / problems with new medications (rash/itching, nausea, etc)  3. Fever over 101.5 F (38.5 C) 4. Inability to urinate 5. Nausea and/or vomiting 6. Worsening swelling or bruising 7. Continued bleeding from incision. 8. Increased pain, redness, or drainage from the incision  The clinic staff is available to answer your questions during regular business hours (8:30am-5pm).  Please dont hesitate to call and ask to speak to one of our nurses for clinical concerns.   A surgeon from [facility] is always on call at the hospitals   If you have a medical emergency, go to the nearest emergency room or call 911.    [facility], [place], [place], [place], [place]  [address] ? MAIN: [phone] ? TOLL FREE: [phone] ? FAX [phone] [email or web address]

## 2015-12-19 NOTE — Progress Notes (Signed)
Pt's vitals are wnl, tolerating diet and pain is under control. Discussed discharged instructions with both patient and family. Discharged to home with prescriptions.

## 2015-12-19 NOTE — Discharge Summary (Signed)
Physician Discharge Summary  Patient ID: Meghan Anderson MRN: [id number] DOB/AGE: [birth year] 78 y.o.  Admit date: 12/14/2015 Discharge date: 12/19/2015  Admission Diagnoses: Rectal mass  Discharge Diagnoses:  Active Problems:   Rectal cancer   Discharged Condition: stable  Hospital Course: patient admitted after transanal excision.  She developed some swelling and abd tenderness after surgery.  She was kept on IV antibiotics until these symptoms resolved.  Once she was tolerating PO intake again, she was discharged to home to complete her course of antibiotics.   Consults: None  Significant Diagnostic Studies: labs: cbc, chemistry  Treatments: IV hydration, antibiotics: Invanz, analgesia: Vicodin and surgery: transanal excision  Discharge Exam: Blood pressure 155/73, pulse 80, temperature 97.6 F (36.4 C), temperature source Oral, resp. rate 15, height 5\' 4"  (1.626 m), weight 56.654 kg (124 lb 14.4 oz), SpO2 98 %. General appearance: alert and cooperative GI: soft, mildly tender in lower abd only, no peritonitis  Disposition: 01-Home or Self Care     Medication List    STOP taking these medications        predniSONE 20 MG tablet  Commonly known as:  DELTASONE      TAKE these medications        albuterol (2.5 MG/3ML) 0.083% nebulizer solution  Commonly known as:  PROVENTIL  NEBULIZE 1 VIAL EVERY 4 HOURS AS NEEDED     amLODipine 5 MG tablet  Commonly known as:  NORVASC  Take 1 tablet (5 mg total) by mouth daily.     atorvastatin 80 MG tablet  Commonly known as:  LIPITOR  Take 1 tablet (80 mg total) by mouth daily.     BREO ELLIPTA 100-25 MCG/INH Aepb  Generic drug:  Fluticasone Furoate-Vilanterol  USE 1 PUFF ONCE DAILY     fluticasone 50 MCG/ACT nasal spray  Commonly known as:  FLONASE  Place 2 sprays into both nostrils daily as needed.     HYDROcodone-acetaminophen 5-325 MG tablet  Commonly known as:  NORCO/VICODIN  Take 1-2 tablets by mouth every 4 (four)  hours as needed for moderate pain.     levothyroxine 150 MCG tablet  Commonly known as:  SYNTHROID, LEVOTHROID  Take 1 tablet (150 mcg total) by mouth daily.     lisinopril-hydrochlorothiazide 20-12.5 MG tablet  Commonly known as:  PRINZIDE,ZESTORETIC  TAKE TWO TABLETS BY MOUTH DAILY     omeprazole 40 MG capsule  Commonly known as:  PRILOSEC  TAKE ONE (1) CAPSULE EACH DAY     polyethylene glycol powder powder  Commonly known as:  GLYCOLAX/MIRALAX  Take 17 g by mouth 2 (two) times daily as needed.     potassium chloride 10 MEQ tablet  Commonly known as:  K-DUR,KLOR-CON  Take 10 mEq by mouth daily.     tiotropium 18 MCG inhalation capsule  Commonly known as:  SPIRIVA HANDIHALER  Place 1 capsule (18 mcg total) into inhaler and inhale daily.       Follow-up Information    Follow up with Rosario Adie., MD. Schedule an appointment as soon as possible for a visit in 2 weeks.   Specialty:  General Surgery   Contact information:   [address] [place] [address] [phone]       Signed: Rosario Adie [id number], [id number] AM

## 2015-12-28 ENCOUNTER — Encounter (HOSPITAL_COMMUNITY): Payer: Self-pay

## 2015-12-29 ENCOUNTER — Other Ambulatory Visit: Payer: Self-pay | Admitting: Family Medicine

## 2016-01-01 ENCOUNTER — Ambulatory Visit (HOSPITAL_COMMUNITY): Payer: Medicare Other | Admitting: Hematology & Oncology

## 2016-01-11 ENCOUNTER — Encounter (HOSPITAL_COMMUNITY): Payer: Self-pay | Admitting: Lab

## 2016-01-11 ENCOUNTER — Encounter (HOSPITAL_COMMUNITY): Payer: Self-pay | Admitting: Hematology & Oncology

## 2016-01-11 ENCOUNTER — Encounter (HOSPITAL_COMMUNITY): Payer: Medicare Other | Attending: Hematology & Oncology | Admitting: Hematology & Oncology

## 2016-01-11 ENCOUNTER — Other Ambulatory Visit: Payer: Self-pay

## 2016-01-11 ENCOUNTER — Telehealth: Payer: Self-pay | Admitting: Gastroenterology

## 2016-01-11 VITALS — BP 190/89 | HR 87 | Temp 98.4°F | Resp 18 | Wt 122.0 lb

## 2016-01-11 DIAGNOSIS — D649 Anemia, unspecified: Secondary | ICD-10-CM | POA: Diagnosis not present

## 2016-01-11 DIAGNOSIS — C20 Malignant neoplasm of rectum: Secondary | ICD-10-CM | POA: Insufficient documentation

## 2016-01-11 DIAGNOSIS — J449 Chronic obstructive pulmonary disease, unspecified: Secondary | ICD-10-CM

## 2016-01-11 DIAGNOSIS — N183 Chronic kidney disease, stage 3 (moderate): Secondary | ICD-10-CM

## 2016-01-11 NOTE — Progress Notes (Signed)
Referral sent to [facility].  Records faxed on 3/2

## 2016-01-11 NOTE — Progress Notes (Signed)
[facility] at [place] Note  Patient Care Team: [name], MD as PCP - General [name], MD as Consulting Physician (Ophthalmology) [name], MD as Consulting Physician (Gastroenterology)  CHIEF COMPLAINTS/PURPOSE OF CONSULTATION:  Low rectal mass with high grade glandular dysplasia Severe constipation, unable to pass stool without stool softener Colonoscopy on 11/01/15 with Dr. [name] revealing large fungating rectal mass, precluded colonoscopy Weight loss CT Abdomen/Pelvis 11/01/15 with irregular annular thickening in mid rectum with rectal intussusception, 4 hypodense subcentimeter liver lesions too small to characterize, chronic infectious or inflammatory bronchiolitis at the lung bases COPD Transanal minimally invasive surgery with excision of rectal polyp with Dr. [name] on 1/0/0712 Final pathology MMR IHC normal, moderately differentiated rectal adenocarcinoma 4.3 cm, tumor invades through muscularis propria into pericolorectal tissue, margins of resection positive T3,NX,MX   HISTORY OF PRESENTING ILLNESS:  Meghan Anderson 80 y.o. female is here for follow-up of her rectal carcinoma.   Meghan Anderson is here with her daughter and 2 sons.  She recently had surgery with Dr. Marcello Moores. She states that she has had a hard time after surgery. The bulk of the tumor was cut out but there is still residual tumor around the area.   She states that she has been taking a stool softener and that this makes her bowel movements easier than before. She states that overall her BM's are easier to pass.   She says that she has been feeling better. Her son said that she has been "eating like a bird" and has been eating different things. But that she still does not have an appetite. Her other son said that he has tried to give her ensure but instead of using them as a supplement she uses them as a meal "replacement."  She states that she had some  problems with her legs the other day. Her son said that they were black. She said that when she was in the hospital the 2nd night her feet were hurting really bad and she could not feel her feet, they were also purple and black. Her son said they were "like you had spray painted them" Denies any history of peripheral vascular disease.   Her son said that he also bought her compression stockings but that she only wore them for a small period of time. She said that they make her toes burn.  She uses her nebulizer every morning and if needed before bed. She also uses several inhalers.   She sees Dr. Michela Pitcher, her pulmonologist on March 29th in [place].   When we discussed having a repeat colonoscopy she states that she does not like drinking the prep solution and vomited last time. She notes she will do another if needed but only if she has another prep option.  She is here today for further evaluation of her diagnosis and discussion of treatment options including radiation and chemotherapy.   MEDICAL HISTORY:  Past Medical History  Diagnosis Date  . Hypertension   . Hyperlipidemia   . Chronic bronchitis ([place])   . Thyroid disease   . GERD (gastroesophageal reflux disease)   . Arthritis   . Murmur, heart   . Cataract   . Hypothyroidism   . Complication of anesthesia   . PONV (postoperative nausea and vomiting)   . COPD (chronic obstructive pulmonary disease) ([place])     SURGICAL HISTORY: Past Surgical History  Procedure Laterality Date  . Left ear    .  Abdominal hysterectomy      complete  . Colonoscopy N/A 11/01/2015    RMR: Large fungating rectal mass precluded colonoscopy. Status post biopsy.   . Transanal endoscopic microsurgery N/A 12/14/2015    Procedure: TRANSANAL ENDOSCOPIC MICROSURGERY OF RECTAL POLYP;  Surgeon: [name], MD;  Location: [facility];  Service: General;  Laterality: N/A;    SOCIAL HISTORY: Social History   Social History  . Marital Status: Widowed     Spouse Name: N/A  . Number of Children: N/A  . Years of Education: N/A   Occupational History  . Not on file.   Social History Main Topics  . Smoking status: Former Smoker    Quit date: 11/11/2010  . Smokeless tobacco: Never Used  . Alcohol Use: No  . Drug Use: No  . Sexual Activity: Not on file   Other Topics Concern  . Not on file   Social History Narrative   Widowed for 38 years. 2 sons and 2 daughters. 9 grandchildren 76 great-grandchildren Ex smoker, quit about 2 years ago.  She enjoys painting and riding her golf cart [place] the farm.  She is from this area.   FAMILY HISTORY: Family History  Problem Relation Age of Onset  . Kidney disease Mother     nephrectomy x 1  . COPD Brother   . Emphysema Brother   . Asthma Brother   . Cancer Brother 23    brain  . Colon cancer Neg Hx    indicated that her mother is deceased. She indicated that her father is deceased. She indicated that her sister is alive. She indicated that three of her six brothers are alive.   Mother deceased at 69 yo, congestive heart failure. Father deceased at 47 yo, lung troubles, gall stones, and congestive heart failure.  6 siblings. Only two living. Her baby brother and her twin brother are still living. One brother died of a brain tumor in his 30s. Lived with brain tumor for about 10 years. One brother had lung problems and died of a heart attack in his 70s. One brother had stomach cancer and died in his early 53s. One sister died at 2 yo of congestive heart failure.  ALLERGIES:  is allergic to moxifloxacin hcl in nacl; levaquin; prednisone; and quinolones.  MEDICATIONS:  Current Outpatient Prescriptions  Medication Sig Dispense Refill  . albuterol (PROVENTIL) (2.5 MG/3ML) 0.083% nebulizer solution NEBULIZE 1 VIAL EVERY 4 HOURS AS NEEDED (Patient taking differently: NEBULIZE 1 VIAL EVERY 4 HOURS AS NEEDED FOR SHORTNESS OF BREATHE.) 150 mL 2  . amLODipine (NORVASC) 5 MG tablet Take 1 tablet  (5 mg total) by mouth daily. 30 tablet 5  . atorvastatin (LIPITOR) 80 MG tablet Take 1 tablet (80 mg total) by mouth daily. (Patient taking differently: Take 40 mg by mouth daily at 12 noon. ) 90 tablet 3  . fluticasone (FLONASE) 50 MCG/ACT nasal spray Place 2 sprays into both nostrils daily as needed. (Patient taking differently: Place 2 sprays into both nostrils daily as needed for allergies or rhinitis. ) 16 g 1  . Fluticasone Furoate-Vilanterol (BREO ELLIPTA) 100-25 MCG/INH AEPB USE 1 PUFF ONCE DAILY    . levothyroxine (SYNTHROID, LEVOTHROID) 150 MCG tablet Take 1 tablet (150 mcg total) by mouth daily. 90 tablet 3  . lisinopril-hydrochlorothiazide (PRINZIDE,ZESTORETIC) 20-12.5 MG tablet TAKE TWO TABLETS BY MOUTH DAILY 60 tablet 3  . omeprazole (PRILOSEC) 40 MG capsule TAKE ONE (1) CAPSULE EACH DAY 30 capsule 2  . polyethylene glycol powder (  GLYCOLAX/MIRALAX) powder Take 17 g by mouth 2 (two) times daily as needed. 3350 g 1  . potassium chloride (K-DUR,KLOR-CON) 10 MEQ tablet Take 10 mEq by mouth daily.    Marland Kitchen tiotropium (SPIRIVA HANDIHALER) 18 MCG inhalation capsule Place 1 capsule (18 mcg total) into inhaler and inhale daily. 90 capsule 3  . HYDROcodone-acetaminophen (NORCO/VICODIN) 5-325 MG tablet Take 1-2 tablets by mouth every 4 (four) hours as needed for moderate pain. (Patient not taking: Reported on 01/11/2016) 30 tablet 0   No current facility-administered medications for this visit.    Review of Systems  HENT: Negative.   Eyes: Negative.   Respiratory: Negative.   Cardiovascular: Negative.   Gastrointestinal: Positive for constipation.       Managed with stool softeners  Genitourinary: Negative.   Musculoskeletal: Negative.   Skin: Negative.   Neurological: Negative.   Endo/Heme/Allergies: Negative.   Psychiatric/Behavioral: Negative.   All other systems reviewed and are negative.  14 point ROS was done and is otherwise as detailed above or in HPI   PHYSICAL  EXAMINATION: ECOG PERFORMANCE STATUS: 1 - Symptomatic but completely ambulatory  Filed Vitals:   01/11/16 1321  BP: 190/89  Pulse: 87  Temp: 98.4 F (36.9 C)  Resp: 18   Filed Weights   01/11/16 1321  Weight: 122 lb (55.339 kg)     Physical Exam  Constitutional: She is oriented to person, place, and time and well-developed, well-nourished, and in no distress.  Able to get on exam table with assistance.  HENT:  Head: Normocephalic and atraumatic.  Mouth/Throat: Oropharynx is clear and moist.  Eyes: Conjunctivae and EOM are normal. Pupils are equal, round, and reactive to light.  Neck: Normal range of motion. Neck supple. No JVD present. No tracheal deviation present. No thyromegaly present.  Cardiovascular: Normal rate, regular rhythm and normal heart sounds.   Pulmonary/Chest: Effort normal.  Coarse rhonchi scattered throughout.  Abdominal: Soft. Bowel sounds are normal. She exhibits no distension. There is no tenderness. There is no rebound and no guarding.  Musculoskeletal: Normal range of motion.  Lymphadenopathy:    She has no cervical adenopathy.  Neurological: She is alert and oriented to person, place, and time. No cranial nerve deficit. Gait normal. Coordination normal.  Skin: Skin is warm and dry.  Psychiatric: Affect and judgment normal.  Nursing note and vitals reviewed.   PATHOLOGY:            LABORATORY DATA:  I have reviewed the data as listed Lab Results  Component Value Date   WBC 6.7 12/18/2015   HGB 9.1* 12/18/2015   HCT 29.5* 12/18/2015   MCV 94.2 12/18/2015   PLT 229 12/18/2015   CMP     Component Value Date/Time   NA 141 12/18/2015 0540   NA 140 11/07/2015 1255   K 4.4 12/18/2015 0540   CL 109 12/18/2015 0540   CO2 23 12/18/2015 0540   GLUCOSE 135* 12/18/2015 0540   GLUCOSE 94 11/07/2015 1255   BUN 16 12/18/2015 0540   BUN 14 11/07/2015 1255   CREATININE 1.14* 12/18/2015 0540   CALCIUM 8.3* 12/18/2015 0540   PROT 4.9*  11/01/2015 1323   PROT 5.8* 12/29/2014 1155   ALBUMIN 2.7* 11/01/2015 1323   ALBUMIN 3.5 12/29/2014 1155   AST 13* 11/01/2015 1323   ALT 8* 11/01/2015 1323   ALKPHOS 53 11/01/2015 1323   BILITOT 0.4 11/01/2015 1323   BILITOT 0.2 12/29/2014 1155   GFRNONAA 45* 12/18/2015 0540   GFRAA 52*  12/18/2015 0540   Results for BRIONY, PARVEEN (MRN [id number])   Ref. Range 11/01/2015 13:23  CEA Latest Ref Range: 0.0-4.7 ng/mL 5.7 (H)     RADIOGRAPHIC STUDIES: I have personally reviewed the radiological images as listed and agreed with the findings in the report. Study Result     CLINICAL DATA: 80 year old female with reported history of rectal mass on recent colonoscopy and weight loss. Patient reports chronic constipation and rectal bleeding. Gastroesophageal reflux disease. Prior hysterectomy.  EXAM: CT ABDOMEN AND PELVIS WITH CONTRAST  TECHNIQUE: Multidetector CT imaging of the abdomen and pelvis was performed using the standard protocol following bolus administration of intravenous contrast.  CONTRAST: 62m OMNIPAQUE IOHEXOL 300 MG/ML SOLN  COMPARISON: None.  FINDINGS: Lower chest: There are patchy ground-glass opacities with associated mild cylindrical bronchiectasis in the visualized right middle lobe, lingula and right greater than left lower lobes.  Hepatobiliary: There are four tiny hypodense liver lesions scattered throughout the liver, largest 0.5 cm in segment 5 of the right liver lobe (series 2/ image 21), too small to characterize. Otherwise normal liver. Normal gallbladder with no radiopaque cholelithiasis. No biliary ductal dilatation.  Pancreas: Normal, with no mass or duct dilation.  Spleen: Normal size. No mass.  Adrenals/Urinary Tract: Normal adrenals. Simple 1.5 cm renal cyst in the posterior upper right kidney. There are a few additional subcentimeter hypodense lesions in both kidneys, too small to characterize. No hydronephrosis.  Normal bladder.  Stomach/Bowel: Grossly normal stomach. Normal caliber small bowel with no small bowel wall thickening. Appendix is not discretely visualized. Moderate sigmoid diverticulosis, with no large bowel wall thickening or pericolonic fat stranding. There is irregular annular wall thickening in the mid rectum with associated intussusception, best seen on the coronal series 4/image 63.  Vascular/Lymphatic: Atherosclerotic nonaneurysmal abdominal aorta. Patent portal, splenic, hepatic and renal veins. No pathologically enlarged lymph nodes in the abdomen or pelvis. There is a borderline prominent 0.7 cm upper presacral node in the midline (series 2/image 53).  Reproductive: Status post hysterectomy, with no abnormal findings at the vaginal cuff. No adnexal mass.  Other: No pneumoperitoneum, ascites or focal fluid collection.  Musculoskeletal: No aggressive appearing focal osseous lesions. Asymmetric moderate osteoarthritis in the left hip joint. Marked degenerative changes in the visualized thoracolumbar spine.  IMPRESSION: 1. Irregular annular wall thickening in the mid rectum with associated rectal intussusception, presumably representing a primary rectal neoplasm. 2. Borderline prominent upper presacral lymph node, possibly metastatic. 3. Four hypodense subcentimeter liver lesions, too small to characterize. If the rectal mass proves to represent a rectal malignancy, a follow-up CT abdomen with IV contrast or MRI abdomen with and without IV contrast is recommended in 3 months. 4. Chronic infectious or inflammatory bronchiolitis at the lung bases, likely due to atypical mycobacterial infection (MAI) or recurrent aspiration. 5. Moderate sigmoid diverticulosis.   Electronically Signed  By: JIlona SorrelM.D.  On: 11/07/2015 09:28     ASSESSMENT & PLAN:   Low rectal mass with high grade glandular dysplasia Severe constipation, unable to pass stool  without stool softener Colonoscopy on 11/01/15 with Dr. RGala Romneyrevealing large fungating rectal mass, precluded colonoscopy Weight loss CT Abdomen/Pelvis 11/01/15 with irregular annular thickening in mid rectum with rectal intussusception, 4 hypodense subcentimeter liver lesions too small to characterize, chronic infectious or inflammatory bronchiolitis at the lung bases COPD Anemia CKD, stage 3  I discussed her pathology report and the need for additional therapy. The patient has a decent PS with her limiting factor being her  pulmonary disease. She is not O2 dependent. I have recommended proceeding as follows: 1. Referral back to GI for completion C scope 2. Pelvic MRI 3. have her case presented to the multidisciplinary GI tumor board in [place] next week or the week after.   Refer for consulation with Dr. Pablo Ledger of radiation oncology in [place].    She will return for a follow up in 2 weeks at which time we should have a treatment plan in place to move forward.   All questions were answered. The patient knows to call the clinic with any problems, questions or concerns.  This document serves as a record of services personally performed by Ancil Linsey, MD. It was created on her behalf by Kandace Blitz, a trained medical scribe. The creation of this record is based on the scribe's personal observations and the provider's statements to them. This document has been checked and approved by the attending provider.  I have reviewed the above documentation for accuracy and completeness, and I agree with the above.  This note was electronically signed.    Molli Hazard, MD  01/11/2016 4:22 PM

## 2016-01-11 NOTE — Telephone Encounter (Signed)
Pt has urgent spot OV on 03/03. Spot held on RMR schedule for 03/06.  Pt is aware of OV appt.

## 2016-01-11 NOTE — Telephone Encounter (Signed)
Took phone call from Dr. Whitney Muse regarding patient. Patient with low rectal mass (adenocarcinoma). Previous attempted colonoscopy by Dr. [name] incomplete due to obstructive mass. Patient has subsequently went to surgery with rectal mass resection and has residual cancer. Difficult surgery/recovery period due to COPD.  Dr. Whitney Muse plans to present patient to tumor board on 01/24/2016. She and Dr. Marcello Moores (surgeon) requesting completed colonoscopy at this point to clear the remaining part of her colon. Patient states she is unable to take tri-lyte again. Dr. Whitney Muse is requesting small volume bowel prep as patient otherwise is declining having the procedure done. Currently she is managing her constipation with Dulcolax and stool softeners.  Please schedule colonoscopy with Dr. [name] ASAP. Needs to be next week. You can use urgent OV to bring patient in for updated H+P.  Please tell patient to continue Dulcolax 10-15mg  daily until her procedure (keep BMs moving). We will provide her with a sample of suprep for bowel prep.

## 2016-01-11 NOTE — Patient Instructions (Addendum)
[place] at [facility] Discharge Instructions  RECOMMENDATIONS MADE BY THE CONSULTANT AND ANY TEST RESULTS WILL BE SENT TO YOUR REFERRING PHYSICIAN.   Exam and discussion by Dr Whitney Muse today The risk of getting obstructed is low.  We need to get a MRI, we will get you scheduled for that Speak with Dr [name], see if you need another colonoscopy. We are going to present you at tumor board, they meet once a week.  Chemotherapy can be done here.  Radiation can be done at [facility] or [facility] (this is done every day Monday-Friday). Use boost, ensure, or carnation good start breakfast, not for meal replacements, but in between your meals  Return to see the doctor in about 2 weeks Please call the clinic if you have any questions or concerns    Thank you for choosing [place] at [facility] to provide your oncology and hematology care.  To afford each patient quality time with our provider, please arrive at least 15 minutes before your scheduled appointment time.   Beginning January 23rd 2017 lab work for the [facility] will be done in the  Main lab at [facility] on 1st floor. If you have a lab appointment with the [facility] please come in thru the  Main Entrance and check in at the main information desk  You need to re-schedule your appointment should you arrive 10 or more minutes late.  We strive to give you quality time with our providers, and arriving late affects you and other patients whose appointments are after yours.  Also, if you no show three or more times for appointments you may be dismissed from the clinic at the providers discretion.     Again, thank you for choosing [facility].  Our hope is that these requests will decrease the amount of time that you wait before being seen by our physicians.       _____________________________________________________________  Should you have questions after your visit to  [facility], please contact our office at [phone] between the hours of 8:30 a.m. and 4:30 p.m.  Voicemails left after 4:30 p.m. will not be returned until the following business day.  For prescription refill requests, have your pharmacy contact our office.         Resources For Cancer Patients and their Caregivers ? American Cancer Society: Can assist with transportation, wigs, general needs, runs Look Good Feel Better.        (463)274-0080 ? Cancer Care: Provides financial assistance, online support groups, medication/co-pay assistance.  1-800-813-HOPE 9256038868) ? [place] Assists [place] cancer patients and their families through emotional , educational and financial support.  607 100 5464 ? [place] DSS Where to apply for food stamps, Medicaid and utility assistance. 270-411-9486 ? [facility]: Transportation to medical appointments. 712-345-5355 ? Social Security Administration: May apply for disability if have a Stage IV cancer. 905-784-4570 (579)653-1657 ? [employer], Disability and Transit Services: Assists with nutrition, care and transit needs. 831-570-2294

## 2016-01-12 ENCOUNTER — Encounter: Payer: Self-pay | Admitting: Nurse Practitioner

## 2016-01-12 ENCOUNTER — Other Ambulatory Visit: Payer: Self-pay

## 2016-01-12 ENCOUNTER — Ambulatory Visit (INDEPENDENT_AMBULATORY_CARE_PROVIDER_SITE_OTHER): Payer: Medicare Other | Admitting: Nurse Practitioner

## 2016-01-12 ENCOUNTER — Other Ambulatory Visit: Payer: Self-pay | Admitting: Family Medicine

## 2016-01-12 VITALS — BP 186/104 | HR 91 | Temp 97.5°F | Ht 64.0 in | Wt 123.2 lb

## 2016-01-12 DIAGNOSIS — K5900 Constipation, unspecified: Secondary | ICD-10-CM

## 2016-01-12 DIAGNOSIS — K625 Hemorrhage of anus and rectum: Secondary | ICD-10-CM

## 2016-01-12 DIAGNOSIS — R198 Other specified symptoms and signs involving the digestive system and abdomen: Secondary | ICD-10-CM

## 2016-01-12 DIAGNOSIS — K6289 Other specified diseases of anus and rectum: Secondary | ICD-10-CM

## 2016-01-12 NOTE — Assessment & Plan Note (Signed)
Improved, currently having bowel movements. Still taking Dulcolax. Recommend continue Dulcolax as needed for bowel movements.

## 2016-01-12 NOTE — Patient Instructions (Signed)
1. We will schedule your procedure for you. 2. Further recommendations to be based on results of your procedure. 3. Return for follow-up in 3 months, or sooner if needed.

## 2016-01-12 NOTE — Assessment & Plan Note (Signed)
No further rectal bleeding since surgery. Continue to monitor.

## 2016-01-12 NOTE — Assessment & Plan Note (Addendum)
Patient with rectal mass discovered on colonoscopy a couple months ago which was nearly obstructing approximately 6 cm from the anal verge. Biopsies inconclusive. Subsequently underwent minimally invasive transanal surgery with excision of rectal polyp but unable to clear the margins. Mass pathology found to be moderately differentiated rectal adenocarcinoma with tumor invasion through the muscularis propria and into the peri-colorectal tissue with positive margins noted. Oncology is requesting complete colonoscopy prior to presentation of the patient to the tumor board. At this point we will proceed with her colonoscopy with low volume prep. She is currently having bowel movements, CT did not indicate abnormalities elsewhere in the colon, is not having overt abdominal pain, and is also status-post mass resection therefore no reason to suspect obstruction contraindicating bowel prep.  Proceed with TCS with Dr. [name] in near future: the risks, benefits, and alternatives have been discussed with the patient in detail. The patient states understanding and desires to proceed.  The patient is on hydrocodone when necessary, no other chronic pain medications, anxiolytics, antidepressants. She is not on any anticoagulants. Conscious sedation was effective for her last procedure 2 months ago and will likely be effective as time as well.

## 2016-01-12 NOTE — Progress Notes (Addendum)
Referring Provider: Dettinger, Fransisca Kaufmann, MD Primary Care Physician:  Fransisca Kaufmann Dettinger, MD Primary GI:  Dr. [name]  Chief Complaint  Patient presents with  . set up TCs    HPI:   Meghan Anderson is a 80 y.o. female who presents for follow-up on low rectal mass. She was last seen in our office on 10/23/2015 for new onset and worsening constipation for the previous 6 months as well as hematochezia. She had never had a colonoscopy previously and was subsequently set up for colonoscopy for further evaluation. Colonoscopy was attempted on 11/01/2015 which found a large fungating mass beginning at 6 cm from the anal verge which essentially obstructed the rectosigmoid lumen and prevented completion of the colonoscopy even with pediatric colonoscope. The lesion was biopsied multiple times, CT of the chest, abdomen, and pelvis was ordered as well as labs. Surgical pathology of the mass biopsies found fragments of tubular adenoma with high grade glandular dysplasia. Deemed likely invasive adenocarcinoma due to size of the mass and the obstructing nature.  CT of the abdomen and pelvis found irregular annular wall thickening in mid rectum with associated rectal intussusception, presumably representing a primary rectal neoplasm, Borderline prominent upper presacral lymph node, possibly metastatic, four hypodense subcentimeter liver lesions, too small to characterize, chronic infectious or inflammatory bronchiolitis at the lung bases, likely due to atypical mycobacterial infection (MAI) or recurrent aspiration, moderate sigmoid diverticulosis.   She was referred to surgery at [facility] by Dr. [name] who performed transanal minimally invasive surgery with excision of rectal polyp on 12/14/15, but residual tumor remains. Post-op surgical pathology found moderately differentiated rectal adenocarcinoma with tumor invasion through the muscularis propria and into the pericolorectal tissue; Margins were positive. Dr.  Whitney Muse of oncology plans to present the patient to the tumor board but is requesting completed colonoscopy in order to clear the colon as well as low volume prep due to patient's inability to tolerate and refusing full prep solution.   Today she states she's doing ok. Is having bowel movements, still taking Dulcolax. Some residual occasional abdominal pain LLQ. Denies any further rectal bleeding. Denies melena, N/V. She has lost another 4 pounds in the past 2 months. Continued baseline shortness of breath related to COPD history. Conscious sedation worked well for her last time. Denies chest pain, dizziness, lightheadedness, syncope, near syncope. Denies any other upper or lower GI symptoms.  Past Medical History  Diagnosis Date  . Hypertension   . Hyperlipidemia   . Chronic bronchitis ([place])   . Thyroid disease   . GERD (gastroesophageal reflux disease)   . Arthritis   . Murmur, heart   . Cataract   . Hypothyroidism   . Complication of anesthesia   . PONV (postoperative nausea and vomiting)   . COPD (chronic obstructive pulmonary disease) [facility])     Past Surgical History  Procedure Laterality Date  . Left ear    . Abdominal hysterectomy      complete  . Colonoscopy N/A 11/01/2015    RMR: Large fungating rectal mass precluded colonoscopy. Status post biopsy.   . Transanal endoscopic microsurgery N/A 12/14/2015    Procedure: TRANSANAL ENDOSCOPIC MICROSURGERY OF RECTAL POLYP;  Surgeon: [name], MD;  Location: [facility];  Service: General;  Laterality: N/A;    Current Outpatient Prescriptions  Medication Sig Dispense Refill  . albuterol (PROVENTIL) (2.5 MG/3ML) 0.083% nebulizer solution NEBULIZE 1 VIAL EVERY 4 HOURS AS NEEDED (Patient taking differently: NEBULIZE 1 VIAL EVERY 4 HOURS AS  NEEDED FOR SHORTNESS OF BREATHE.) 150 mL 2  . amLODipine (NORVASC) 5 MG tablet Take 1 tablet (5 mg total) by mouth daily. 30 tablet 5  . atorvastatin (LIPITOR) 80 MG tablet Take 1 tablet (80 mg total)  by mouth daily. (Patient taking differently: Take 40 mg by mouth daily at 12 noon. ) 90 tablet 3  . fluticasone (FLONASE) 50 MCG/ACT nasal spray Place 2 sprays into both nostrils daily as needed. (Patient taking differently: Place 2 sprays into both nostrils daily as needed for allergies or rhinitis. ) 16 g 1  . Fluticasone Furoate-Vilanterol (BREO ELLIPTA) 100-25 MCG/INH AEPB USE 1 PUFF ONCE DAILY    . HYDROcodone-acetaminophen (NORCO/VICODIN) 5-325 MG tablet Take 1-2 tablets by mouth every 4 (four) hours as needed for moderate pain. 30 tablet 0  . levothyroxine (SYNTHROID, LEVOTHROID) 150 MCG tablet Take 1 tablet (150 mcg total) by mouth daily. 90 tablet 3  . lisinopril-hydrochlorothiazide (PRINZIDE,ZESTORETIC) 20-12.5 MG tablet TAKE TWO TABLETS BY MOUTH DAILY 60 tablet 3  . omeprazole (PRILOSEC) 40 MG capsule TAKE ONE (1) CAPSULE EACH DAY 30 capsule 2  . polyethylene glycol powder (GLYCOLAX/MIRALAX) powder Take 17 g by mouth 2 (two) times daily as needed. 3350 g 1  . potassium chloride (K-DUR,KLOR-CON) 10 MEQ tablet Take 10 mEq by mouth daily.    Marland Kitchen tiotropium (SPIRIVA HANDIHALER) 18 MCG inhalation capsule Place 1 capsule (18 mcg total) into inhaler and inhale daily. 90 capsule 3   No current facility-administered medications for this visit.    Allergies as of 01/12/2016 - Review Complete 01/12/2016  Allergen Reaction Noted  . Moxifloxacin hcl in nacl Rash 11/30/2015  . Levaquin [levofloxacin in d5w]  11/30/2015  . Prednisone Itching and Rash 08/27/2013  . Quinolones Rash 07/29/2014    Family History  Problem Relation Age of Onset  . Kidney disease Mother     nephrectomy x 1  . COPD Brother   . Emphysema Brother   . Asthma Brother   . Cancer Brother 80    brain  . Colon cancer Neg Hx     Social History   Social History  . Marital Status: Widowed    Spouse Name: N/A  . Number of Children: N/A  . Years of Education: N/A   Social History Main Topics  . Smoking status: Former  Smoker    Quit date: 11/11/2010  . Smokeless tobacco: Never Used  . Alcohol Use: No  . Drug Use: No  . Sexual Activity: Not Asked   Other Topics Concern  . None   Social History Narrative    Review of Systems: General: Negative for fever, chills, fatigue, weakness. ENT: Negative for hoarseness, difficulty swallowing. CV: Negative for chest pain, angina, palpitations, peripheral edema.  Respiratory: Negative for worsening dyspnea, cough, sputum, wheezing.  GI: See history of present illness. Endo: Negative for unusual weight change.  Heme: Negative for bruising or bleeding.   Physical Exam: BP 186/104 mmHg  Pulse 91  Temp(Src) 97.5 F (36.4 C)  Ht 5\' 4"  (1.626 m)  Wt 123 lb 3.2 oz (55.883 kg)  BMI 21.14 kg/m2 General:   Alert and oriented. Pleasant and cooperative. Well-nourished and well-developed.  Head:  Normocephalic and atraumatic. Eyes:  Without icterus, sclera clear and conjunctiva pink.  Ears:  Normal auditory acuity. Cardiovascular:  S1, S2 present without murmurs appreciated. Extremities without clubbing or edema. Respiratory:  Bilateral wheezes noted. No rales or rhonchi. No distress.  Gastrointestinal:  +BS, soft, and non-distended. Minimal LLQ TTP. No  HSM noted. No guarding or rebound. No masses appreciated.  Rectal:  Deferred  Musculoskalatal:  Symmetrical without gross deformities. Skin:  Intact without significant lesions or rashes. Neurologic:  Alert and oriented x4;  grossly normal neurologically. Psych:  Alert and cooperative. Normal mood and affect. Heme/Lymph/Immune: No excessive bruising noted.    01/12/2016 11:03 AM   Disclaimer: This note was dictated with voice recognition software. Similar sounding words can inadvertently be transcribed and may not be corrected upon review.

## 2016-01-15 ENCOUNTER — Ambulatory Visit (HOSPITAL_COMMUNITY)
Admission: RE | Admit: 2016-01-15 | Discharge: 2016-01-15 | Disposition: A | Discharge status: Home / Self Care | Payer: Medicare Other | Source: Ambulatory Visit | Attending: Internal Medicine | Admitting: Internal Medicine

## 2016-01-15 ENCOUNTER — Encounter (HOSPITAL_COMMUNITY)
Admission: RE | Disposition: A | Discharge status: Home / Self Care | Payer: Self-pay | Source: Ambulatory Visit | Attending: Internal Medicine

## 2016-01-15 DIAGNOSIS — M1991 Primary osteoarthritis, unspecified site: Secondary | ICD-10-CM | POA: Diagnosis not present

## 2016-01-15 DIAGNOSIS — I10 Essential (primary) hypertension: Secondary | ICD-10-CM | POA: Diagnosis not present

## 2016-01-15 DIAGNOSIS — D124 Benign neoplasm of descending colon: Secondary | ICD-10-CM | POA: Diagnosis not present

## 2016-01-15 DIAGNOSIS — K5730 Diverticulosis of large intestine without perforation or abscess without bleeding: Secondary | ICD-10-CM | POA: Diagnosis not present

## 2016-01-15 DIAGNOSIS — J449 Chronic obstructive pulmonary disease, unspecified: Secondary | ICD-10-CM | POA: Diagnosis not present

## 2016-01-15 DIAGNOSIS — Z7951 Long term (current) use of inhaled steroids: Secondary | ICD-10-CM | POA: Diagnosis not present

## 2016-01-15 DIAGNOSIS — E785 Hyperlipidemia, unspecified: Secondary | ICD-10-CM | POA: Diagnosis not present

## 2016-01-15 DIAGNOSIS — K219 Gastro-esophageal reflux disease without esophagitis: Secondary | ICD-10-CM | POA: Diagnosis not present

## 2016-01-15 DIAGNOSIS — Z808 Family history of malignant neoplasm of other organs or systems: Secondary | ICD-10-CM | POA: Insufficient documentation

## 2016-01-15 DIAGNOSIS — D122 Benign neoplasm of ascending colon: Secondary | ICD-10-CM | POA: Insufficient documentation

## 2016-01-15 DIAGNOSIS — D123 Benign neoplasm of transverse colon: Secondary | ICD-10-CM | POA: Insufficient documentation

## 2016-01-15 DIAGNOSIS — C20 Malignant neoplasm of rectum: Secondary | ICD-10-CM | POA: Diagnosis not present

## 2016-01-15 DIAGNOSIS — E039 Hypothyroidism, unspecified: Secondary | ICD-10-CM | POA: Insufficient documentation

## 2016-01-15 DIAGNOSIS — K625 Hemorrhage of anus and rectum: Secondary | ICD-10-CM | POA: Diagnosis not present

## 2016-01-15 DIAGNOSIS — Z79899 Other long term (current) drug therapy: Secondary | ICD-10-CM | POA: Diagnosis not present

## 2016-01-15 HISTORY — PX: COLONOSCOPY: SHX5424

## 2016-01-15 SURGERY — COLONOSCOPY
Anesthesia: Moderate Sedation

## 2016-01-15 MED ORDER — ONDANSETRON HCL 4 MG/2ML IJ SOLN
INTRAMUSCULAR | Status: DC | PRN
  Administered 2016-01-15: 4 mg via INTRAVENOUS

## 2016-01-15 MED ORDER — MEPERIDINE HCL 100 MG/ML IJ SOLN
INTRAMUSCULAR | Status: AC
  Filled 2016-01-15: qty 2

## 2016-01-15 MED ORDER — ONDANSETRON HCL 4 MG/2ML IJ SOLN
INTRAMUSCULAR | Status: AC
  Filled 2016-01-15: qty 2

## 2016-01-15 MED ORDER — MIDAZOLAM HCL 5 MG/5ML IJ SOLN
INTRAMUSCULAR | Status: DC | PRN
  Administered 2016-01-15 (×2): 1 mg via INTRAVENOUS
  Administered 2016-01-15: 2 mg via INTRAVENOUS

## 2016-01-15 MED ORDER — STERILE WATER FOR IRRIGATION IR SOLN
Status: DC | PRN
  Administered 2016-01-15: 13:00:00

## 2016-01-15 MED ORDER — MEPERIDINE HCL 100 MG/ML IJ SOLN
INTRAMUSCULAR | Status: DC | PRN
  Administered 2016-01-15: 50 mg via INTRAVENOUS
  Administered 2016-01-15: 25 mg via INTRAVENOUS

## 2016-01-15 MED ORDER — SODIUM CHLORIDE 0.9 % IV SOLN
INTRAVENOUS | Status: DC
  Administered 2016-01-15: 13:00:00 via INTRAVENOUS

## 2016-01-15 MED ORDER — MIDAZOLAM HCL 5 MG/5ML IJ SOLN
INTRAMUSCULAR | Status: AC
  Filled 2016-01-15: qty 10

## 2016-01-15 NOTE — H&P (View-Only) (Signed)
Referring Provider: Dettinger, Fransisca Kaufmann, MD Primary Care Physician:  Fransisca Kaufmann Dettinger, MD Primary GI:  Dr. [name]  Chief Complaint  Patient presents with  . set up TCs    HPI:   Meghan Anderson is a 80 y.o. female who presents for follow-up on low rectal mass. She was last seen in our office on 10/23/2015 for new onset and worsening constipation for the previous 6 months as well as hematochezia. She had never had a colonoscopy previously and was subsequently set up for colonoscopy for further evaluation. Colonoscopy was attempted on 11/01/2015 which found a large fungating mass beginning at 6 cm from the anal verge which essentially obstructed the rectosigmoid lumen and prevented completion of the colonoscopy even with pediatric colonoscope. The lesion was biopsied multiple times, CT of the chest, abdomen, and pelvis was ordered as well as labs. Surgical pathology of the mass biopsies found fragments of tubular adenoma with high grade glandular dysplasia. Deemed likely invasive adenocarcinoma due to size of the mass and the obstructing nature.  CT of the abdomen and pelvis found irregular annular wall thickening in mid rectum with associated rectal intussusception, presumably representing a primary rectal neoplasm, Borderline prominent upper presacral lymph node, possibly metastatic, four hypodense subcentimeter liver lesions, too small to characterize, chronic infectious or inflammatory bronchiolitis at the lung bases, likely due to atypical mycobacterial infection (MAI) or recurrent aspiration, moderate sigmoid diverticulosis.   She was referred to surgery at [facility] by Dr. [name] who performed transanal minimally invasive surgery with excision of rectal polyp on 12/14/15, but residual tumor remains. Post-op surgical pathology found moderately differentiated rectal adenocarcinoma with tumor invasion through the muscularis propria and into the pericolorectal tissue; Margins were positive. Dr.  Whitney Muse of oncology plans to present the patient to the tumor board but is requesting completed colonoscopy in order to clear the colon as well as low volume prep due to patient's inability to tolerate and refusing full prep solution.   Today she states she's doing ok. Is having bowel movements, still taking Dulcolax. Some residual occasional abdominal pain LLQ. Denies any further rectal bleeding. Denies melena, N/V. She has lost another 4 pounds in the past 2 months. Continued baseline shortness of breath related to COPD history. Conscious sedation worked well for her last time. Denies chest pain, dizziness, lightheadedness, syncope, near syncope. Denies any other upper or lower GI symptoms.  Past Medical History  Diagnosis Date  . Hypertension   . Hyperlipidemia   . Chronic bronchitis ([place])   . Thyroid disease   . GERD (gastroesophageal reflux disease)   . Arthritis   . Murmur, heart   . Cataract   . Hypothyroidism   . Complication of anesthesia   . PONV (postoperative nausea and vomiting)   . COPD (chronic obstructive pulmonary disease) [facility])     Past Surgical History  Procedure Laterality Date  . Left ear    . Abdominal hysterectomy      complete  . Colonoscopy N/A 11/01/2015    RMR: Large fungating rectal mass precluded colonoscopy. Status post biopsy.   . Transanal endoscopic microsurgery N/A 12/14/2015    Procedure: TRANSANAL ENDOSCOPIC MICROSURGERY OF RECTAL POLYP;  Surgeon: [name], MD;  Location: [facility];  Service: General;  Laterality: N/A;    Current Outpatient Prescriptions  Medication Sig Dispense Refill  . albuterol (PROVENTIL) (2.5 MG/3ML) 0.083% nebulizer solution NEBULIZE 1 VIAL EVERY 4 HOURS AS NEEDED (Patient taking differently: NEBULIZE 1 VIAL EVERY 4 HOURS AS  NEEDED FOR SHORTNESS OF BREATHE.) 150 mL 2  . amLODipine (NORVASC) 5 MG tablet Take 1 tablet (5 mg total) by mouth daily. 30 tablet 5  . atorvastatin (LIPITOR) 80 MG tablet Take 1 tablet (80 mg total)  by mouth daily. (Patient taking differently: Take 40 mg by mouth daily at 12 noon. ) 90 tablet 3  . fluticasone (FLONASE) 50 MCG/ACT nasal spray Place 2 sprays into both nostrils daily as needed. (Patient taking differently: Place 2 sprays into both nostrils daily as needed for allergies or rhinitis. ) 16 g 1  . Fluticasone Furoate-Vilanterol (BREO ELLIPTA) 100-25 MCG/INH AEPB USE 1 PUFF ONCE DAILY    . HYDROcodone-acetaminophen (NORCO/VICODIN) 5-325 MG tablet Take 1-2 tablets by mouth every 4 (four) hours as needed for moderate pain. 30 tablet 0  . levothyroxine (SYNTHROID, LEVOTHROID) 150 MCG tablet Take 1 tablet (150 mcg total) by mouth daily. 90 tablet 3  . lisinopril-hydrochlorothiazide (PRINZIDE,ZESTORETIC) 20-12.5 MG tablet TAKE TWO TABLETS BY MOUTH DAILY 60 tablet 3  . omeprazole (PRILOSEC) 40 MG capsule TAKE ONE (1) CAPSULE EACH DAY 30 capsule 2  . polyethylene glycol powder (GLYCOLAX/MIRALAX) powder Take 17 g by mouth 2 (two) times daily as needed. 3350 g 1  . potassium chloride (K-DUR,KLOR-CON) 10 MEQ tablet Take 10 mEq by mouth daily.    Marland Kitchen tiotropium (SPIRIVA HANDIHALER) 18 MCG inhalation capsule Place 1 capsule (18 mcg total) into inhaler and inhale daily. 90 capsule 3   No current facility-administered medications for this visit.    Allergies as of 01/12/2016 - Review Complete 01/12/2016  Allergen Reaction Noted  . Moxifloxacin hcl in nacl Rash 11/30/2015  . Levaquin [levofloxacin in d5w]  11/30/2015  . Prednisone Itching and Rash 08/27/2013  . Quinolones Rash 07/29/2014    Family History  Problem Relation Age of Onset  . Kidney disease Mother     nephrectomy x 1  . COPD Brother   . Emphysema Brother   . Asthma Brother   . Cancer Brother 41    brain  . Colon cancer Neg Hx     Social History   Social History  . Marital Status: Widowed    Spouse Name: N/A  . Number of Children: N/A  . Years of Education: N/A   Social History Main Topics  . Smoking status: Former  Smoker    Quit date: 11/11/2010  . Smokeless tobacco: Never Used  . Alcohol Use: No  . Drug Use: No  . Sexual Activity: Not Asked   Other Topics Concern  . None   Social History Narrative    Review of Systems: General: Negative for fever, chills, fatigue, weakness. ENT: Negative for hoarseness, difficulty swallowing. CV: Negative for chest pain, angina, palpitations, peripheral edema.  Respiratory: Negative for worsening dyspnea, cough, sputum, wheezing.  GI: See history of present illness. Endo: Negative for unusual weight change.  Heme: Negative for bruising or bleeding.   Physical Exam: BP 186/104 mmHg  Pulse 91  Temp(Src) 97.5 F (36.4 C)  Ht 5\' 4"  (1.626 m)  Wt 123 lb 3.2 oz (55.883 kg)  BMI 21.14 kg/m2 General:   Alert and oriented. Pleasant and cooperative. Well-nourished and well-developed.  Head:  Normocephalic and atraumatic. Eyes:  Without icterus, sclera clear and conjunctiva pink.  Ears:  Normal auditory acuity. Cardiovascular:  S1, S2 present without murmurs appreciated. Extremities without clubbing or edema. Respiratory:  Bilateral wheezes noted. No rales or rhonchi. No distress.  Gastrointestinal:  +BS, soft, and non-distended. Minimal LLQ TTP. No  HSM noted. No guarding or rebound. No masses appreciated.  Rectal:  Deferred  Musculoskalatal:  Symmetrical without gross deformities. Skin:  Intact without significant lesions or rashes. Neurologic:  Alert and oriented x4;  grossly normal neurologically. Psych:  Alert and cooperative. Normal mood and affect. Heme/Lymph/Immune: No excessive bruising noted.    01/12/2016 11:03 AM   Disclaimer: This note was dictated with voice recognition software. Similar sounding words can inadvertently be transcribed and may not be corrected upon review.

## 2016-01-15 NOTE — Op Note (Signed)
[facility] [address] [place], [address]   COLONOSCOPY PROCEDURE REPORT  PATIENT: Meghan, Anderson  MR#: [id number] BIRTHDATE: 1936/08/06 , 60  yrs. old GENDER: female ENDOSCOPIST: R.  Garfield Cornea, MD FACP [facility] REFERRED BY:  Drs. Dettinger, Whitney Muse and Thomas PROCEDURE DATE:  2016/02/02 PROCEDURE:   Colonoscopy with snare polypectomy INDICATIONS:    History of obstructing rectal cancer  -  status post debulking surgery recently. MEDICATIONS: Versed 4 mg IV and Demerol 75 mg IV in divided doses. Zofran 4 mg IV. ASA CLASS:       Class III  CONSENT: The risks, benefits, alternatives and imponderables including but not limited to bleeding, perforation as well as the possibility of a missed lesion have been reviewed.  The potential for biopsy, lesion removal, etc. have also been discussed. Questions have been answered.  All parties agreeable.  Please see the history and physical in the medical record for more information.  DESCRIPTION OF PROCEDURE:   After the risks benefits and alternatives of the procedure were thoroughly explained, informed consent was obtained.  The digital rectal exam revealed no abnormalities of the rectum.   The [id number]  endoscope was introduced through the anus and advanced to the cecum, which was identified by both the appendix and ileocecal valve. No adverse events experienced.   The quality of the prep was adequate  The instrument was then slowly withdrawn as the colon was fully examined. Estimated blood loss is zero unless otherwise noted in this procedure report.      COLON FINDINGS: The previously noted rectal polypoid mass now much smaller in size status post debulking - identified at 6-8 cm from the anal verge..  Small amount of suture material present.  No other lesions seen in the rectum.  Retroflexion performed.  I was easily able to negotiate the adult colonoscope around this lesion and on up into the  colon.  The patient was noted to have pancolonic diverticula; (1) 5 mm polyp in the mid descending segment and a second polyp at the splenic flexure.  There is (1) 5 mm polyp in the ascending segment with an adjacent diminutive polyp.  The remainder of the colonic mucosa appeared normal.  The above-mentioned polyps were either hot or cold snare removed. Retroflexion was performed. .  Withdrawal time=9 minutes 0 seconds.  The scope was withdrawn and the procedure completed. COMPLICATIONS: There were no immediate complications. EBL 3 mL ENDOSCOPIC IMPRESSION: Rectal polypoid cancer?"much smaller than previously seen  - status post surgical debulking. No longer obstructing.  Multiple colonic polyps?"removed as described above  Pancolonic diverticulosis  RECOMMENDATIONS: Follow-up on pathology. Plan to keep appointments with Drs. Penland and [employer]:  R. Garfield Cornea, MD Rosalita Chessman [facility] February 02, 2016 1:53 PM   cc:  CPT CODES: ICD CODES:  The ICD and CPT codes recommended by this software are interpretations from the data that the clinical staff has captured with the software.  The verification of the translation of this report to the ICD and CPT codes and modifiers is the sole responsibility of the health care institution and practicing physician where this report was generated.  [employer]. will not be held responsible for the validity of the ICD and CPT codes included on this report.  AMA assumes no liability for data contained or not contained herein. CPT is a Designer, television/film set of the [employer].  PATIENT NAME:  Meghan, Anderson MR#: [id number]

## 2016-01-15 NOTE — Interval H&P Note (Signed)
History and Physical Interval Note:  01/15/2016 1:10 PM  Meghan Anderson  has presented today for surgery, with the diagnosis of RECTAL BLEEDING  The various methods of treatment have been discussed with the patient and family. After consideration of risks, benefits and other options for treatment, the patient has consented to  Procedure(s) with comments: COLONOSCOPY (N/A) - 130  as a surgical intervention .  The patient's history has been reviewed, patient examined, no change in status, stable for surgery.  I have reviewed the patient's chart and labs.  Questions were answered to the patient's satisfaction.     Meghan Anderson  No change. Attempt complete colonoscopy today per plan.  The risks, benefits, limitations, alternatives and imponderables have been reviewed with the patient. Questions have been answered. All parties are agreeable.

## 2016-01-15 NOTE — Discharge Instructions (Signed)
Colonoscopy Discharge Instructions  Read the instructions outlined below and refer to this sheet in the next few weeks. These discharge instructions provide you with general information on caring for yourself after you leave the hospital. Your doctor may also give you specific instructions. While your treatment has been planned according to the most current medical practices available, unavoidable complications occasionally occur. If you have any problems or questions after discharge, call Dr. [name] at [phone]. ACTIVITY  You may resume your regular activity, but move at a slower pace for the next 24 hours.   Take frequent rest periods for the next 24 hours.   Walking will help get rid of the air and reduce the bloated feeling in your belly (abdomen).   No driving for 24 hours (because of the medicine (anesthesia) used during the test).    Do not sign any important legal documents or operate any machinery for 24 hours (because of the anesthesia used during the test).  NUTRITION  Drink plenty of fluids.   You may resume your normal diet as instructed by your doctor.   Begin with a light meal and progress to your normal diet. Heavy or fried foods are harder to digest and may make you feel sick to your stomach (nauseated).   Avoid alcoholic beverages for 24 hours or as instructed.  MEDICATIONS  You may resume your normal medications unless your doctor tells you otherwise.  WHAT YOU CAN EXPECT TODAY  Some feelings of bloating in the abdomen.   Passage of more gas than usual.   Spotting of blood in your stool or on the toilet paper.  IF YOU HAD POLYPS REMOVED DURING THE COLONOSCOPY:  No aspirin products for 7 days or as instructed.   No alcohol for 7 days or as instructed.   Eat a soft diet for the next 24 hours.  FINDING OUT THE RESULTS OF YOUR TEST Not all test results are available during your visit. If your test results are not back during the visit, make an appointment  with your caregiver to find out the results. Do not assume everything is normal if you have not heard from your caregiver or the medical facility. It is important for you to follow up on all of your test results.  SEEK IMMEDIATE MEDICAL ATTENTION IF:  You have more than a spotting of blood in your stool.   Your belly is swollen (abdominal distention).   You are nauseated or vomiting.   You have a temperature over 101.   You have abdominal pain or discomfort that is severe or gets worse throughout the day.    Colon polyp and diverticulosis information provided  Further recommendations to follow pending review of pathology report  Follow-up with Dr. Whitney Muse as scheduled   Diverticulosis Diverticulosis is the condition that develops when small pouches (diverticula) form in the wall of your colon. Your colon, or large intestine, is where water is absorbed and stool is formed. The pouches form when the inside layer of your colon pushes through weak spots in the outer layers of your colon. CAUSES  No one knows exactly what causes diverticulosis. RISK FACTORS  Being older than 49. Your risk for this condition increases with age. Diverticulosis is rare in people younger than 40 years. By age 54, almost everyone has it.  Eating a low-fiber diet.  Being frequently constipated.  Being overweight.  Not getting enough exercise.  Smoking.  Taking over-the-counter pain medicines, like aspirin and ibuprofen. SYMPTOMS  Most people  with diverticulosis do not have symptoms. DIAGNOSIS  Because diverticulosis often has no symptoms, health care providers often discover the condition during an exam for other colon problems. In many cases, a health care provider will diagnose diverticulosis while using a flexible scope to examine the colon (colonoscopy). TREATMENT  If you have never developed an infection related to diverticulosis, you may not need treatment. If you have had an infection before,  treatment may include:  Eating more fruits, vegetables, and grains.  Taking a fiber supplement.  Taking a live bacteria supplement (probiotic).  Taking medicine to relax your colon. HOME CARE INSTRUCTIONS   Drink at least 6-8 glasses of water each day to prevent constipation.  Try not to strain when you have a bowel movement.  Keep all follow-up appointments. If you have had an infection before:  Increase the fiber in your diet as directed by your health care provider or dietitian.  Take a dietary fiber supplement if your health care provider approves.  Only take medicines as directed by your health care provider. SEEK MEDICAL CARE IF:   You have abdominal pain.  You have bloating.  You have cramps.  You have not gone to the bathroom in 3 days. SEEK IMMEDIATE MEDICAL CARE IF:   Your pain gets worse.  Yourbloating becomes very bad.  You have a fever or chills, and your symptoms suddenly get worse.  You begin vomiting.  You have bowel movements that are bloody or black. MAKE SURE YOU:  Understand these instructions.  Will watch your condition.  Will get help right away if you are not doing well or get worse.   This information is not intended to replace advice given to you by your health care provider. Make sure you discuss any questions you have with your health care provider.   Document Released: 07/25/2004 Document Revised: 11/02/2013 Document Reviewed: 09/22/2013 Elsevier Interactive Patient Education 2016 Elsevier Inc.   Colon Polyps Polyps are lumps of extra tissue growing inside the body. Polyps can grow in the large intestine (colon). Most colon polyps are noncancerous (benign). However, some colon polyps can become cancerous over time. Polyps that are larger than a pea may be harmful. To be safe, caregivers remove and test all polyps. CAUSES  Polyps form when mutations in the genes cause your cells to grow and divide even though no more tissue is  needed. RISK FACTORS There are a number of risk factors that can increase your chances of getting colon polyps. They include:  Being older than 50 years.  Family history of colon polyps or colon cancer.  Long-term colon diseases, such as colitis or Crohn disease.  Being overweight.  Smoking.  Being inactive.  Drinking too much alcohol. SYMPTOMS  Most small polyps do not cause symptoms. If symptoms are present, they may include:  Blood in the stool. The stool may look dark red or black.  Constipation or diarrhea that lasts longer than 1 week. DIAGNOSIS People often do not know they have polyps until their caregiver finds them during a regular checkup. Your caregiver can use 4 tests to check for polyps:  Digital rectal exam. The caregiver wears gloves and feels inside the rectum. This test would find polyps only in the rectum.  Barium enema. The caregiver puts a liquid called barium into your rectum before taking X-rays of your colon. Barium makes your colon look white. Polyps are dark, so they are easy to see in the X-ray pictures.  Sigmoidoscopy. A thin, flexible tube (  sigmoidoscope) is placed into your rectum. The sigmoidoscope has a light and tiny camera in it. The caregiver uses the sigmoidoscope to look at the last third of your colon.  Colonoscopy. This test is like sigmoidoscopy, but the caregiver looks at the entire colon. This is the most common method for finding and removing polyps. TREATMENT  Any polyps will be removed during a sigmoidoscopy or colonoscopy. The polyps are then tested for cancer. PREVENTION  To help lower your risk of getting more colon polyps:  Eat plenty of fruits and vegetables. Avoid eating fatty foods.  Do not smoke.  Avoid drinking alcohol.  Exercise every day.  Lose weight if recommended by your caregiver.  Eat plenty of calcium and folate. Foods that are rich in calcium include milk, cheese, and broccoli. Foods that are rich in folate  include chickpeas, kidney beans, and spinach. HOME CARE INSTRUCTIONS Keep all follow-up appointments as directed by your caregiver. You may need periodic exams to check for polyps. SEEK MEDICAL CARE IF: You notice bleeding during a bowel movement.   This information is not intended to replace advice given to you by your health care provider. Make sure you discuss any questions you have with your health care provider.   Document Released: 07/24/2004 Document Revised: 11/18/2014 Document Reviewed: 01/07/2012 Elsevier Interactive Patient Education [employer].

## 2016-01-16 NOTE — Progress Notes (Signed)
CC'ED TO PCP 

## 2016-01-17 ENCOUNTER — Encounter: Payer: Self-pay | Admitting: Internal Medicine

## 2016-01-18 ENCOUNTER — Telehealth: Payer: Self-pay

## 2016-01-18 NOTE — Telephone Encounter (Signed)
Per RMR- Send letter to patient.  Send copy of letter with path to referring provider and PCP.   Ov w [place] 1year from now

## 2016-01-18 NOTE — Telephone Encounter (Signed)
Reminder in epic °

## 2016-01-18 NOTE — Telephone Encounter (Signed)
Letter mailed to the pt. 

## 2016-01-23 ENCOUNTER — Ambulatory Visit (HOSPITAL_COMMUNITY)
Admission: RE | Admit: 2016-01-23 | Discharge: 2016-01-23 | Disposition: A | Discharge status: Home / Self Care | Payer: Medicare Other | Source: Ambulatory Visit | Attending: Oncology | Admitting: Oncology

## 2016-01-23 ENCOUNTER — Other Ambulatory Visit (HOSPITAL_COMMUNITY): Payer: Self-pay | Admitting: Oncology

## 2016-01-23 ENCOUNTER — Other Ambulatory Visit (HOSPITAL_COMMUNITY): Payer: Self-pay | Admitting: *Deleted

## 2016-01-23 ENCOUNTER — Ambulatory Visit (HOSPITAL_COMMUNITY)
Admission: RE | Admit: 2016-01-23 | Discharge: 2016-01-23 | Disposition: A | Discharge status: Home / Self Care | Payer: Medicare Other | Source: Ambulatory Visit | Attending: Hematology & Oncology | Admitting: Hematology & Oncology

## 2016-01-23 ENCOUNTER — Encounter (HOSPITAL_COMMUNITY): Payer: Self-pay | Admitting: Internal Medicine

## 2016-01-23 DIAGNOSIS — C20 Malignant neoplasm of rectum: Secondary | ICD-10-CM | POA: Insufficient documentation

## 2016-01-23 DIAGNOSIS — R198 Other specified symptoms and signs involving the digestive system and abdomen: Secondary | ICD-10-CM | POA: Insufficient documentation

## 2016-01-23 DIAGNOSIS — K5730 Diverticulosis of large intestine without perforation or abscess without bleeding: Secondary | ICD-10-CM | POA: Diagnosis not present

## 2016-01-23 DIAGNOSIS — K6289 Other specified diseases of anus and rectum: Secondary | ICD-10-CM

## 2016-01-23 LAB — POCT I-STAT CREATININE: Creatinine, Ser: 1.1 mg/dL — ABNORMAL HIGH (ref 0.44–1.00)

## 2016-01-23 MED ORDER — IOHEXOL 300 MG/ML  SOLN
100.0000 mL | Freq: Once | INTRAMUSCULAR | Status: AC | PRN
  Administered 2016-01-23: 80 mL via INTRAVENOUS

## 2016-01-23 MED ORDER — BARIUM SULFATE 2.1 % PO SUSP
ORAL | Status: AC
  Filled 2016-01-23: qty 1

## 2016-01-23 NOTE — Progress Notes (Unsigned)
Ronalee Belts from MRI called.  Patient has a history of ear surgery without much more information than that.  Patient unable to provide further information and therefore radiology is not able to perform MRI.  As a result, MRI is cancelled and we will pursue CT pelvis w contrast based upon Dr. Donald Pore previous dictation.  Robynn Pane, PA-C 01/23/2016 4:18 PM

## 2016-01-24 DIAGNOSIS — R0600 Dyspnea, unspecified: Secondary | ICD-10-CM | POA: Diagnosis not present

## 2016-01-24 DIAGNOSIS — J449 Chronic obstructive pulmonary disease, unspecified: Secondary | ICD-10-CM | POA: Diagnosis not present

## 2016-01-26 ENCOUNTER — Encounter (HOSPITAL_BASED_OUTPATIENT_CLINIC_OR_DEPARTMENT_OTHER): Payer: Medicare Other | Admitting: Hematology & Oncology

## 2016-01-26 ENCOUNTER — Encounter (HOSPITAL_COMMUNITY): Payer: Self-pay | Admitting: Hematology & Oncology

## 2016-01-26 VITALS — BP 185/93 | HR 90 | Temp 98.4°F | Resp 18 | Wt 122.0 lb

## 2016-01-26 DIAGNOSIS — D630 Anemia in neoplastic disease: Secondary | ICD-10-CM

## 2016-01-26 DIAGNOSIS — C20 Malignant neoplasm of rectum: Secondary | ICD-10-CM | POA: Diagnosis not present

## 2016-01-26 DIAGNOSIS — N183 Chronic kidney disease, stage 3 (moderate): Secondary | ICD-10-CM

## 2016-01-26 DIAGNOSIS — D649 Anemia, unspecified: Secondary | ICD-10-CM | POA: Diagnosis not present

## 2016-01-26 DIAGNOSIS — K5902 Outlet dysfunction constipation: Secondary | ICD-10-CM

## 2016-01-26 DIAGNOSIS — J449 Chronic obstructive pulmonary disease, unspecified: Secondary | ICD-10-CM

## 2016-01-26 NOTE — Patient Instructions (Addendum)
[place] at [facility] Discharge Instructions  RECOMMENDATIONS MADE BY THE CONSULTANT AND ANY TEST RESULTS WILL BE SENT TO YOUR REFERRING PHYSICIAN.   Exam and discussion by Dr Whitney Muse today You are a candidate for chemotherapy and radiation. There is an IV(5FU) or a pill(Xeloda) form of chemotherapy. The only way to cure this cancer is to have surgery, radiation, and chemotherapy together  Referral to radiation oncologist in [place], we can get you rescheduled  The chemotherapy makes the radiation work better.  After you are done with radiation and chemotherapy, then you will meet with your surgeon. We will get a PICC line placed. Hildred Alamin will be in touch with you about your chemotherapy. Please call the clinic if you have any questions or concerns        Thank you for choosing [place] at [facility] to provide your oncology and hematology care.  To afford each patient quality time with our provider, please arrive at least 15 minutes before your scheduled appointment time.   Beginning January 23rd 2017 lab work for the [facility] will be done in the  Main lab at [facility] on 1st floor. If you have a lab appointment with the [facility] please come in thru the  Main Entrance and check in at the main information desk  You need to re-schedule your appointment should you arrive 10 or more minutes late.  We strive to give you quality time with our providers, and arriving late affects you and other patients whose appointments are after yours.  Also, if you no show three or more times for appointments you may be dismissed from the clinic at the providers discretion.     Again, thank you for choosing [facility].  Our hope is that these requests will decrease the amount of time that you wait before being seen by our physicians.       _____________________________________________________________  Should you have questions  after your visit to [facility], please contact our office at [phone] between the hours of 8:30 a.m. and 4:30 p.m.  Voicemails left after 4:30 p.m. will not be returned until the following business day.  For prescription refill requests, have your pharmacy contact our office.         Resources For Cancer Patients and their Caregivers ? American Cancer Society: Can assist with transportation, wigs, general needs, runs Look Good Feel Better.        571 233 0504 ? Cancer Care: Provides financial assistance, online support groups, medication/co-pay assistance.  1-800-813-HOPE 623-813-2834) ? [facility] Assists [place] cancer patients and their families through emotional , educational and financial support.  614 826 3134 ? [place] DSS Where to apply for food stamps, Medicaid and utility assistance. 616-512-5112 ? [facility]: Transportation to medical appointments. 239-773-7889 ? Social Security Administration: May apply for disability if have a Stage IV cancer. 718-017-5692 (930)680-4614 ? [employer], Disability and Transit Services: Assists with nutrition, care and transit needs. (952) 835-4603

## 2016-01-27 NOTE — Progress Notes (Signed)
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 [name] revealing large fungating rectal mass, precluded colonoscopy Weight loss CT Abdomen/Pelvis 11/01/15 with irregular annular thickening in mid rectum with rectal intussusception, 4 hypodense subcentimeter liver lesions too small to characterize, chronic infectious or inflammatory bronchiolitis at the lung bases COPD Transanal minimally invasive surgery with excision of rectal polyp with Dr. [name] on 05/17/9389 Final pathology MMR IHC normal, moderately differentiated rectal adenocarcinoma 4.3 cm, tumor invades through muscularis propria into pericolorectal tissue, margins of resection positive T3,NX,MX   HISTORY OF PRESENTING ILLNESS:  Meghan Anderson 80 y.o. female is here for follow-up of her rectal carcinoma.   Meghan Anderson is here with her daughter and 2 sons. She denies problems with constipation. She completed her colonoscopy. Unfortunately she could not have an MRI of the pelvis because of "ear surgery" years back. She had a CT of the pelvis.  She has seen her pulmonologist this past week.   She is interested in proceeding with additional therapy for her rectal carcinoma. She is here today to discuss moving forward.   MEDICAL HISTORY:  Past Medical History  Diagnosis Date  . Hypertension   . Hyperlipidemia   . Chronic bronchitis ([place])   . Thyroid disease   . GERD (gastroesophageal reflux disease)   . Arthritis   . Murmur, heart   . Cataract   . Hypothyroidism   . Complication of anesthesia   . PONV (postoperative nausea and vomiting)     . COPD (chronic obstructive pulmonary disease) ([place])     SURGICAL HISTORY: Past Surgical History  Procedure Laterality Date  . Left ear    . Abdominal hysterectomy      complete  . Colonoscopy N/A 11/01/2015    RMR: Large fungating rectal mass precluded colonoscopy. Status post biopsy.   . Transanal endoscopic microsurgery N/A 12/14/2015    Procedure: TRANSANAL ENDOSCOPIC MICROSURGERY OF RECTAL POLYP;  Surgeon: [name], MD;  Location: [facility];  Service: General;  Laterality: N/A;  . Colonoscopy N/A 01/15/2016    Procedure: COLONOSCOPY;  Surgeon: [name], MD;  Location: [facility];  Service: Endoscopy;  Laterality: N/A;  130     SOCIAL HISTORY: Social History   Social History  . Marital Status: Widowed    Spouse Name: N/A  . Number of Children: N/A  . Years of Education: N/A   Occupational History  . Not on file.   Social History Main Topics  . Smoking status: Former Smoker    Quit date: 11/11/2010  . Smokeless tobacco: Never Used  . Alcohol Use: No  . Drug Use: No  . Sexual Activity: Not on file   Other Topics Concern  . Not on file   Social History Narrative   Widowed for 38 years. 2 sons and 2 daughters. 9 grandchildren 75 great-grandchildren Ex smoker, quit about 2 years ago.  She enjoys painting and riding her golf cart [place] the farm.  She is from this area.   FAMILY HISTORY: Family History  Problem Relation Age of Onset  . Kidney disease Mother     nephrectomy x 1  . COPD Brother   .  Emphysema Brother   . Asthma Brother   . Cancer Brother 50    brain  . Colon cancer Neg Hx    indicated that her mother is deceased. She indicated that her father is deceased. She indicated that her sister is alive. She indicated that three of her six brothers are alive.   Mother deceased at 74 yo, congestive heart failure. Father deceased at 2 yo, lung troubles, gall stones, and congestive heart failure.  6 siblings. Only two living. Her baby brother  and her twin brother are still living. One brother died of a brain tumor in his 53s. Lived with brain tumor for about 10 years. One brother had lung problems and died of a heart attack in his 39s. One brother had stomach cancer and died in his early 56s. One sister died at 63 yo of congestive heart failure.  ALLERGIES:  is allergic to moxifloxacin hcl in nacl; levaquin; prednisone; and quinolones.  MEDICATIONS:  Current Outpatient Prescriptions  Medication Sig Dispense Refill  . albuterol (PROVENTIL) (2.5 MG/3ML) 0.083% nebulizer solution NEBULIZE 1 VIAL EVERY 4 HOURS AS NEEDED (Patient taking differently: NEBULIZE 1 VIAL EVERY 4 HOURS AS NEEDED FOR SHORTNESS OF BREATHE.) 150 mL 2  . amLODipine (NORVASC) 5 MG tablet Take 1 tablet (5 mg total) by mouth daily. 30 tablet 5  . atorvastatin (LIPITOR) 80 MG tablet Take 1 tablet (80 mg total) by mouth daily. (Patient taking differently: Take 40 mg by mouth daily at 12 noon. ) 90 tablet 3  . fluticasone (FLONASE) 50 MCG/ACT nasal spray Place 2 sprays into both nostrils daily as needed. (Patient taking differently: Place 2 sprays into both nostrils daily as needed for allergies or rhinitis. ) 16 g 1  . Fluticasone Furoate-Vilanterol (BREO ELLIPTA) 100-25 MCG/INH AEPB USE 1 PUFF ONCE DAILY    . HYDROcodone-acetaminophen (NORCO/VICODIN) 5-325 MG tablet Take 1-2 tablets by mouth every 4 (four) hours as needed for moderate pain. 30 tablet 0  . levothyroxine (SYNTHROID, LEVOTHROID) 150 MCG tablet Take 1 tablet (150 mcg total) by mouth daily. 90 tablet 3  . lisinopril-hydrochlorothiazide (PRINZIDE,ZESTORETIC) 20-12.5 MG tablet TAKE TWO TABLETS BY MOUTH DAILY 60 tablet 3  . omeprazole (PRILOSEC) 40 MG capsule TAKE ONE (1) CAPSULE EACH DAY 30 capsule 2  . polyethylene glycol powder (GLYCOLAX/MIRALAX) powder Take 17 g by mouth 2 (two) times daily as needed. 3350 g 1  . potassium chloride (K-DUR,KLOR-CON) 10 MEQ tablet Take 10 mEq by mouth daily.    . potassium  chloride SA (K-DUR,KLOR-CON) 20 MEQ tablet TAKE ONE (1) TABLET EACH DAY 30 tablet 3  . tiotropium (SPIRIVA HANDIHALER) 18 MCG inhalation capsule Place 1 capsule (18 mcg total) into inhaler and inhale daily. 90 capsule 3   No current facility-administered medications for this visit.    Review of Systems  Constitutional: Negative for fever, chills, weight loss and malaise/fatigue.  HENT: Negative.  Negative for congestion, hearing loss, nosebleeds, sore throat and tinnitus.   Eyes: Negative.  Negative for blurred vision, double vision, pain and discharge.  Respiratory: Negative.  Negative for cough, hemoptysis, sputum production, shortness of breath and wheezing.   Cardiovascular: Negative.  Negative for chest pain, palpitations, claudication, leg swelling and PND.  Gastrointestinal: Positive for constipation. Negative for heartburn, nausea, vomiting, abdominal pain, diarrhea, blood in stool and melena.       Managed with stool softeners  Genitourinary: Negative.  Negative for dysuria, urgency, frequency and hematuria.  Musculoskeletal: Negative.  Negative for myalgias, joint pain and  falls.  Skin: Negative.  Negative for itching and rash.  Neurological: Negative.  Negative for dizziness, tingling, tremors, sensory change, speech change, focal weakness, seizures, loss of consciousness, weakness and headaches.  Endo/Heme/Allergies: Negative.  Does not bruise/bleed easily.  Psychiatric/Behavioral: Negative.  Negative for depression, suicidal ideas, memory loss and substance abuse. The patient is not nervous/anxious and does not have insomnia.   All other systems reviewed and are negative.  14 point ROS was done and is otherwise as detailed above or in HPI   PHYSICAL EXAMINATION: ECOG PERFORMANCE STATUS: 1 - Symptomatic but completely ambulatory  Filed Vitals:   01/26/16 0900  BP: 185/93  Pulse: 90  Temp: 98.4 F (36.9 C)  Resp: 18   Filed Weights   01/26/16 0900  Weight: 122 lb  (55.339 kg)     Physical Exam  Constitutional: She is oriented to person, place, and time and well-developed, well-nourished, and in no distress.  Able to get on exam table without assistance.  HENT:  Head: Normocephalic and atraumatic.  Nose: Nose normal.  Mouth/Throat: Oropharynx is clear and moist. No oropharyngeal exudate.  Eyes: Conjunctivae and EOM are normal. Pupils are equal, round, and reactive to light. Right eye exhibits no discharge. Left eye exhibits no discharge. No scleral icterus.  Neck: Normal range of motion. Neck supple. No JVD present. No tracheal deviation present. No thyromegaly present.  Cardiovascular: Normal rate, regular rhythm and normal heart sounds.  Exam reveals no gallop and no friction rub.   No murmur heard. Pulmonary/Chest: Effort normal and breath sounds normal. She has no wheezes. She has no rales.  Coarse rhonchi scattered throughout.  Abdominal: Soft. Bowel sounds are normal. She exhibits no distension and no mass. There is no tenderness. There is no rebound and no guarding.  Musculoskeletal: Normal range of motion. She exhibits no edema.  Lymphadenopathy:    She has no cervical adenopathy.  Neurological: She is alert and oriented to person, place, and time. She has normal reflexes. No cranial nerve deficit. Gait normal. Coordination normal.  Skin: Skin is warm and dry. No rash noted.  Psychiatric: Mood, memory, affect and judgment normal.  Nursing note and vitals reviewed.   PATHOLOGY:            LABORATORY DATA:  I have reviewed the data as listed Lab Results  Component Value Date   WBC 6.7 12/18/2015   HGB 9.1* 12/18/2015   HCT 29.5* 12/18/2015   MCV 94.2 12/18/2015   PLT 229 12/18/2015   CMP     Component Value Date/Time   NA 141 12/18/2015 0540   NA 140 11/07/2015 1255   K 4.4 12/18/2015 0540   CL 109 12/18/2015 0540   CO2 23 12/18/2015 0540   GLUCOSE 135* 12/18/2015 0540   GLUCOSE 94 11/07/2015 1255   BUN 16  12/18/2015 0540   BUN 14 11/07/2015 1255   CREATININE 1.10* 01/23/2016 1918   CALCIUM 8.3* 12/18/2015 0540   PROT 4.9* 11/01/2015 1323   PROT 5.8* 12/29/2014 1155   ALBUMIN 2.7* 11/01/2015 1323   ALBUMIN 3.5 12/29/2014 1155   AST 13* 11/01/2015 1323   ALT 8* 11/01/2015 1323   ALKPHOS 53 11/01/2015 1323   BILITOT 0.4 11/01/2015 1323   BILITOT 0.2 12/29/2014 1155   GFRNONAA 45* 12/18/2015 0540   GFRAA 52* 12/18/2015 0540   Results for Meghan Anderson, Meghan Anderson (MRN [id number])   Ref. Range 11/01/2015 13:23  CEA Latest Ref Range: 0.0-4.7 ng/mL 5.7 (H)  RADIOGRAPHIC STUDIES: I have personally reviewed the radiological images as listed and agreed with the findings in the report. Study Result     CLINICAL DATA: 80 year old female with reported history of rectal mass on recent colonoscopy and weight loss. Patient reports chronic constipation and rectal bleeding. Gastroesophageal reflux disease. Prior hysterectomy.  EXAM: CT ABDOMEN AND PELVIS WITH CONTRAST  TECHNIQUE: Multidetector CT imaging of the abdomen and pelvis was performed using the standard protocol following bolus administration of intravenous contrast.  CONTRAST: 53m OMNIPAQUE IOHEXOL 300 MG/ML SOLN  COMPARISON: None.  FINDINGS: Lower chest: There are patchy ground-glass opacities with associated mild cylindrical bronchiectasis in the visualized right middle lobe, lingula and right greater than left lower lobes.  Hepatobiliary: There are four tiny hypodense liver lesions scattered throughout the liver, largest 0.5 cm in segment 5 of the right liver lobe (series 2/ image 21), too small to characterize. Otherwise normal liver. Normal gallbladder with no radiopaque cholelithiasis. No biliary ductal dilatation.  Pancreas: Normal, with no mass or duct dilation.  Spleen: Normal size. No mass.  Adrenals/Urinary Tract: Normal adrenals. Simple 1.5 cm renal cyst in the posterior upper right kidney. There  are a few additional subcentimeter hypodense lesions in both kidneys, too small to characterize. No hydronephrosis. Normal bladder.  Stomach/Bowel: Grossly normal stomach. Normal caliber small bowel with no small bowel wall thickening. Appendix is not discretely visualized. Moderate sigmoid diverticulosis, with no large bowel wall thickening or pericolonic fat stranding. There is irregular annular wall thickening in the mid rectum with associated intussusception, best seen on the coronal series 4/image 63.  Vascular/Lymphatic: Atherosclerotic nonaneurysmal abdominal aorta. Patent portal, splenic, hepatic and renal veins. No pathologically enlarged lymph nodes in the abdomen or pelvis. There is a borderline prominent 0.7 cm upper presacral node in the midline (series 2/image 53).  Reproductive: Status post hysterectomy, with no abnormal findings at the vaginal cuff. No adnexal mass.  Other: No pneumoperitoneum, ascites or focal fluid collection.  Musculoskeletal: No aggressive appearing focal osseous lesions. Asymmetric moderate osteoarthritis in the left hip joint. Marked degenerative changes in the visualized thoracolumbar spine.  IMPRESSION: 1. Irregular annular wall thickening in the mid rectum with associated rectal intussusception, presumably representing a primary rectal neoplasm. 2. Borderline prominent upper presacral lymph node, possibly metastatic. 3. Four hypodense subcentimeter liver lesions, too small to characterize. If the rectal mass proves to represent a rectal malignancy, a follow-up CT abdomen with IV contrast or MRI abdomen with and without IV contrast is recommended in 3 months. 4. Chronic infectious or inflammatory bronchiolitis at the lung bases, likely due to atypical mycobacterial infection (MAI) or recurrent aspiration. 5. Moderate sigmoid diverticulosis.   Electronically Signed  By: JIlona SorrelM.D.  On: 11/07/2015 09:28    CLINICAL  DATA: 80year old female status post minimally invasive trans anal excision of rectal adenocarcinoma on 12/14/2015 with positive margins, presenting for treatment planning.  EXAM: CT PELVIS WITH CONTRAST    IMPRESSION: 1. Nonspecific mild residual rectal wall thickening with associated mild perirectal fat stranding posteriorly in the mid to upper rectum (8-9 cm superior to the anal verge), which could represent postsurgical changes and/or residual tumor. 2. No pelvic lymphadenopathy. No evidence of metastatic disease in the pelvis. 3. Moderate sigmoid diverticulosis.   Electronically Signed  By: JIlona SorrelM.D.  On: 01/24/2016 09:10  ASSESSMENT & PLAN:   Low rectal mass with high grade glandular dysplasia Severe constipation, unable to pass stool without stool softener Colonoscopy on 11/01/15 with Dr. RGala Romneyrevealing  large fungating rectal mass, precluded colonoscopy Weight loss CT Abdomen/Pelvis 11/01/15 with irregular annular thickening in mid rectum with rectal intussusception, 4 hypodense subcentimeter liver lesions too small to characterize, chronic infectious or inflammatory bronchiolitis at the lung bases COPD Anemia CKD, stage 3 Colonoscopy 01/15/3016 with multiple colonic polyps, tubular adenoma CT pelvis 01/24/2016 with no pelvic LAD, no evidence of metastatic disease in the pelvis  I discussed her pathology report and the need for additional therapy. The patient has a decent PS with her limiting factor being her pulmonary disease. We reviewed her colonoscopy.  I reviewed her CT scans in detail.  I discussed her case with Dr. Oretha Caprice who concurs that additional pelvic staging at this point is not warranted.  She has a T3 lesion, no significantly enlarged nodes on CT  She missed her appointment in [place] today to follow-up here, I have referred her back.  I have spoke with Dr. Marcello Moores who will see the patient one week after finishing concurrent therapy.     Labs  including CBC to follow-up with post-op anemia and CEA will be obtained at follow-up.  All questions were answered. The patient knows to call the clinic with any problems, questions or concerns.  This note was electronically signed.    Molli Hazard, MD  01/27/2016 4:11 PM

## 2016-01-30 ENCOUNTER — Other Ambulatory Visit (HOSPITAL_COMMUNITY): Payer: Self-pay | Admitting: *Deleted

## 2016-01-30 DIAGNOSIS — K6289 Other specified diseases of anus and rectum: Secondary | ICD-10-CM

## 2016-02-07 ENCOUNTER — Other Ambulatory Visit (HOSPITAL_COMMUNITY): Payer: Self-pay | Admitting: Oncology

## 2016-02-07 ENCOUNTER — Encounter (HOSPITAL_COMMUNITY): Payer: Self-pay | Admitting: Oncology

## 2016-02-07 MED ORDER — ONDANSETRON HCL 8 MG PO TABS: 8.0000 mg | ORAL_TABLET | Freq: Three times a day (TID) | ORAL | Status: DC | PRN

## 2016-02-09 DIAGNOSIS — M1990 Unspecified osteoarthritis, unspecified site: Secondary | ICD-10-CM | POA: Diagnosis not present

## 2016-02-09 DIAGNOSIS — Z881 Allergy status to other antibiotic agents status: Secondary | ICD-10-CM | POA: Diagnosis not present

## 2016-02-09 DIAGNOSIS — J449 Chronic obstructive pulmonary disease, unspecified: Secondary | ICD-10-CM | POA: Diagnosis not present

## 2016-02-09 DIAGNOSIS — C20 Malignant neoplasm of rectum: Secondary | ICD-10-CM | POA: Diagnosis not present

## 2016-02-09 DIAGNOSIS — Z888 Allergy status to other drugs, medicaments and biological substances status: Secondary | ICD-10-CM | POA: Diagnosis not present

## 2016-02-09 DIAGNOSIS — Z87891 Personal history of nicotine dependence: Secondary | ICD-10-CM | POA: Diagnosis not present

## 2016-02-09 DIAGNOSIS — I10 Essential (primary) hypertension: Secondary | ICD-10-CM | POA: Diagnosis not present

## 2016-02-09 DIAGNOSIS — E039 Hypothyroidism, unspecified: Secondary | ICD-10-CM | POA: Diagnosis not present

## 2016-02-09 DIAGNOSIS — K219 Gastro-esophageal reflux disease without esophagitis: Secondary | ICD-10-CM | POA: Diagnosis not present

## 2016-02-09 DIAGNOSIS — C19 Malignant neoplasm of rectosigmoid junction: Secondary | ICD-10-CM | POA: Diagnosis not present

## 2016-02-12 NOTE — Patient Instructions (Addendum)
[facility]   CHEMOTHERAPY INSTRUCTIONS  5FU: Side Effects: bone marrow suppression (low white blood cells - wbcs fight infection, low red blood cells - rbcs make up your blood, low platelets - this is what makes your blood clot, nausea/vomiting, diarrhea, mouth sores, hair loss, dry skin, ocular toxicities (increased tear production, sensitivity to light). You must wear sunscreen/sunglasses. Cover your skin when out in sunlight. You will get burned very easily.  5FU is being given with radiation. The two together have a better effect on the mass in your rectal area. This is why we do both the chemo and radiation together.  You will wear the pump daily until you have completed radiation. Then the pump will be removed.   Once a week you will come to [facility] and have the PICC line dressing changed, labs drawn, and a new bag of 5FU applied to your infusion pump. This will take approximately 2 hours.  We will supply you with batteries for your [employer] pump. If your pump beeps "low battery", you can change your batteries. If your pump beeps and you don't know why it is beeping, you can call the [employer] #. They answer calls 24 hours a day. The # is on the pump itself as well as on the magnet that we provided you with. [employer] bills you for the pump - not [facility].     POTENTIAL SIDE EFFECTS OF TREATMENT: Increased Susceptibility to Infection, Vomiting, Constipation, Hair Thinning, Changes in Character of Skin and Nails (brittleness, dryness,etc.), Bone Marrow Suppression, Abdominal Cramping, Complete Hair Loss, Nausea, Diarrhea, Sun Sensitivity and Mouth Sores   EDUCATIONAL MATERIALS GIVEN AND REVIEWED: Chemotherapy and You booklet Specific Instructions Sheets: 5FU, Zofran, Compazine, EMLA cream   SELF CARE ACTIVITIES WHILE ON CHEMOTHERAPY: Increase your fluid intake 48 hours prior to treatment and drink at least 2  quarts per day after treatment., No alcohol intake., No aspirin or other medications unless approved by your oncologist., Eat foods that are light and easy to digest., Eat foods at cold or room temperature., No fried, fatty, or spicy foods immediately before or after treatment., Have teeth cleaned professionally before starting treatment. Keep dentures and partial plates clean., Use soft toothbrush and do not use mouthwashes that contain alcohol. Biotene is a good mouthwash that is available at most pharmacies or may be ordered by calling [phone]., Use warm salt water gargles (1 teaspoon salt per 1 quart warm water) before and after meals and at bedtime. Or you may rinse with 2 tablespoons of three -percent hydrogen peroxide mixed in eight ounces of water., Always use sunscreen with SPF (Sun Protection Factor) of 30 or higher., Use your nausea medication as directed to prevent nausea., Use your stool softener or laxative as directed to prevent constipation. and Use your anti-diarrheal medication as directed to stop diarrhea.  Please wash your hands for at least 30 seconds using warm soapy water. Handwashing is the #1 way to prevent the spread of germs. Stay away from sick people or people who are getting over a cold. If you develop respiratory systems such as green/yellow mucus production or productive cough or persistent cough let us know and we will see if you need an antibiotic. It is a good idea to keep a pair of gloves on when going into grocery stores/[employer] to decrease your risk of coming into contact with germs on the carts, etc. Carry alcohol hand gel with you at  all times and use it frequently if out in public. All foods need to be cooked thoroughly. No raw foods. No medium or undercooked meats, eggs. If your food is cooked medium well, it does not need to be hot pink or saturated with bloody liquid at all. Vegetables and fruits need to be washed/rinsed under the faucet with a dish detergent  before being consumed. You can eat raw fruits and vegetables unless we tell you otherwise but it would be best if you cooked them or bought frozen. Do not eat off of salad bars or hot bars unless you really trust the cleanliness of the restaurant. If you need dental work, please let Dr. Whitney Muse know before you go for your appointment so that we can coordinate the best possible time for you in regards to your chemo regimen. You need to also let your dentist know that you are actively taking chemo. We may need to do labs prior to your dental appointment. We also want your bowels moving at least every other day. If this is not happening, we need to know so that we can get you on a bowel regimen to help you go. If you are going to have sex, your partner must wear a condom. This is to protect your partner from potential chemotherapy exposure.      MEDICATIONS: You have been given prescriptions for the following medications:  Zofran 8mg  tablet. Take 1 tablet every 8 hours as needed for nausea/vomiting. (#1 nausea med to take, this can constipate)     Over-the-Counter Meds:  Miralax 1 capful in 8 oz of fluid daily. May increase to two times a day if needed. This is a stool softener. If this doesn't work proceed you can add:  Senokot S - start with 1 tablet two times a day and increase to 4 tablets two times a day if needed. (total of 8 tablets in a 24 hour period). This is a stimulant laxative.   Call us if this does not help your bowels move.   Imodium 2mg  capsule. Take 2 capsules after the 1st loose stool and then 1 capsule every 2 hours until you go a total of 12 hours without having a loose stool. Call the [facility] if loose stools continue.    SYMPTOMS TO REPORT AS SOON AS POSSIBLE AFTER TREATMENT:  FEVER GREATER THAN 100.5 F  CHILLS WITH OR WITHOUT FEVER  NAUSEA AND VOMITING THAT IS NOT CONTROLLED WITH YOUR NAUSEA MEDICATION  UNUSUAL SHORTNESS OF BREATH  UNUSUAL BRUISING OR  BLEEDING  TENDERNESS IN MOUTH AND THROAT WITH OR WITHOUT PRESENCE OF ULCERS  URINARY PROBLEMS  BOWEL PROBLEMS  UNUSUAL RASH   Wear comfortable clothing and clothing appropriate for easy access to any Portacath or PICC line. Let us know if there is anything that we can do to make your therapy better!      I have been informed and understand all of the instructions given to me and have received a copy. I have been instructed to call the clinic [phone] or my family physician as soon as possible for continued medical care, if indicated. I do not have any more questions at this time but understand that I may call the [facility] or the Patient Navigator at [phone] during office hours should I have questions or need assistance in obtaining follow-up care.       Fluorouracil, 5-FU injection What is this medicine? FLUOROURACIL, 5-FU (flure oh YOOR a sil) is a chemotherapy  drug. It slows the growth of cancer cells. This medicine is used to treat many types of cancer like breast cancer, colon or rectal cancer, pancreatic cancer, and stomach cancer. This medicine may be used for other purposes; ask your health care provider or pharmacist if you have questions. What should I tell my health care provider before I take this medicine? They need to know if you have any of these conditions: -blood disorders -dihydropyrimidine dehydrogenase (DPD) deficiency -infection (especially a virus infection such as chickenpox, cold sores, or herpes) -kidney disease -liver disease -malnourished, poor nutrition -recent or ongoing radiation therapy -an unusual or allergic reaction to fluorouracil, other chemotherapy, other medicines, foods, dyes, or preservatives -pregnant or trying to get pregnant -breast-feeding How should I use this medicine? This drug is given as an infusion or injection into a vein. It is administered in a hospital or clinic by a specially trained health care  professional. Talk to your pediatrician regarding the use of this medicine in children. Special care may be needed. Overdosage: If you think you have taken too much of this medicine contact a poison control center or emergency room at once. NOTE: This medicine is only for you. Do not share this medicine with others. What if I miss a dose? It is important not to miss your dose. Call your doctor or health care professional if you are unable to keep an appointment. What may interact with this medicine? -allopurinol -cimetidine -dapsone -digoxin -hydroxyurea -leucovorin -levamisole -medicines for seizures like ethotoin, fosphenytoin, phenytoin -medicines to increase blood counts like filgrastim, pegfilgrastim, sargramostim -medicines that treat or prevent blood clots like warfarin, enoxaparin, and dalteparin -methotrexate -metronidazole -pyrimethamine -some other chemotherapy drugs like busulfan, cisplatin, estramustine, vinblastine -trimethoprim -trimetrexate -vaccines Talk to your doctor or health care professional before taking any of these medicines: -acetaminophen -aspirin -ibuprofen -ketoprofen -naproxen This list may not describe all possible interactions. Give your health care provider a list of all the medicines, herbs, non-prescription drugs, or dietary supplements you use. Also tell them if you smoke, drink alcohol, or use illegal drugs. Some items may interact with your medicine. What should I watch for while using this medicine? Visit your doctor for checks on your progress. This drug may make you feel generally unwell. This is not uncommon, as chemotherapy can affect healthy cells as well as cancer cells. Report any side effects. Continue your course of treatment even though you feel ill unless your doctor tells you to stop. In some cases, you may be given additional medicines to help with side effects. Follow all directions for their use. Call your doctor or health care  professional for advice if you get a fever, chills or sore throat, or other symptoms of a cold or flu. Do not treat yourself. This drug decreases your body's ability to fight infections. Try to avoid being around people who are sick. This medicine may increase your risk to bruise or bleed. Call your doctor or health care professional if you notice any unusual bleeding. Be careful brushing and flossing your teeth or using a toothpick because you may get an infection or bleed more easily. If you have any dental work done, tell your dentist you are receiving this medicine. Avoid taking products that contain aspirin, acetaminophen, ibuprofen, naproxen, or ketoprofen unless instructed by your doctor. These medicines may hide a fever. Do not become pregnant while taking this medicine. Women should inform their doctor if they wish to become pregnant or think they might be pregnant. There is  a potential for serious side effects to an unborn child. Talk to your health care professional or pharmacist for more information. Do not breast-feed an infant while taking this medicine. Men should inform their doctor if they wish to father a child. This medicine may lower sperm counts. Do not treat diarrhea with over the counter products. Contact your doctor if you have diarrhea that lasts more than 2 days or if it is severe and watery. This medicine can make you more sensitive to the sun. Keep out of the sun. If you cannot avoid being in the sun, wear protective clothing and use sunscreen. Do not use sun lamps or tanning beds/booths. What side effects may I notice from receiving this medicine? Side effects that you should report to your doctor or health care professional as soon as possible: -allergic reactions like skin rash, itching or hives, swelling of the face, lips, or tongue -low blood counts - this medicine may decrease the number of white blood cells, red blood cells and platelets. You may be at increased risk for  infections and bleeding. -signs of infection - fever or chills, cough, sore throat, pain or difficulty passing urine -signs of decreased platelets or bleeding - bruising, pinpoint red spots on the skin, black, tarry stools, blood in the urine -signs of decreased red blood cells - unusually weak or tired, fainting spells, lightheadedness -breathing problems -changes in vision -chest pain -mouth sores -nausea and vomiting -pain, swelling, redness at site where injected -pain, tingling, numbness in the hands or feet -redness, swelling, or sores on hands or feet -stomach pain -unusual bleeding Side effects that usually do not require medical attention (report to your doctor or health care professional if they continue or are bothersome): -changes in finger or toe nails -diarrhea -dry or itchy skin -hair loss -headache -loss of appetite -sensitivity of eyes to the light -stomach upset -unusually teary eyes This list may not describe all possible side effects. Call your doctor for medical advice about side effects. You may report side effects to FDA at 1-800-FDA-1088. Where should I keep my medicine? This drug is given in a hospital or clinic and will not be stored at home. NOTE: This sheet is a summary. It may not cover all possible information. If you have questions about this medicine, talk to your doctor, pharmacist, or health care provider.    2016, Elsevier/Gold Standard. (2008-03-02 13:53:16) Fluorouracil, 5-FU injection What is this medicine? FLUOROURACIL, 5-FU (flure oh YOOR a sil) is a chemotherapy drug. It slows the growth of cancer cells. This medicine is used to treat many types of cancer like breast cancer, colon or rectal cancer, pancreatic cancer, and stomach cancer. This medicine may be used for other purposes; ask your health care provider or pharmacist if you have questions. What should I tell my health care provider before I take this medicine? They need to know if  you have any of these conditions: -blood disorders -dihydropyrimidine dehydrogenase (DPD) deficiency -infection (especially a virus infection such as chickenpox, cold sores, or herpes) -kidney disease -liver disease -malnourished, poor nutrition -recent or ongoing radiation therapy -an unusual or allergic reaction to fluorouracil, other chemotherapy, other medicines, foods, dyes, or preservatives -pregnant or trying to get pregnant -breast-feeding How should I use this medicine? This drug is given as an infusion or injection into a vein. It is administered in a hospital or clinic by a specially trained health care professional. Talk to your pediatrician regarding the use of this medicine in children.  Special care may be needed. Overdosage: If you think you have taken too much of this medicine contact a poison control center or emergency room at once. NOTE: This medicine is only for you. Do not share this medicine with others. What if I miss a dose? It is important not to miss your dose. Call your doctor or health care professional if you are unable to keep an appointment. What may interact with this medicine? -allopurinol -cimetidine -dapsone -digoxin -hydroxyurea -leucovorin -levamisole -medicines for seizures like ethotoin, fosphenytoin, phenytoin -medicines to increase blood counts like filgrastim, pegfilgrastim, sargramostim -medicines that treat or prevent blood clots like warfarin, enoxaparin, and dalteparin -methotrexate -metronidazole -pyrimethamine -some other chemotherapy drugs like busulfan, cisplatin, estramustine, vinblastine -trimethoprim -trimetrexate -vaccines Talk to your doctor or health care professional before taking any of these medicines: -acetaminophen -aspirin -ibuprofen -ketoprofen -naproxen This list may not describe all possible interactions. Give your health care provider a list of all the medicines, herbs, non-prescription drugs, or dietary  supplements you use. Also tell them if you smoke, drink alcohol, or use illegal drugs. Some items may interact with your medicine. What should I watch for while using this medicine? Visit your doctor for checks on your progress. This drug may make you feel generally unwell. This is not uncommon, as chemotherapy can affect healthy cells as well as cancer cells. Report any side effects. Continue your course of treatment even though you feel ill unless your doctor tells you to stop. In some cases, you may be given additional medicines to help with side effects. Follow all directions for their use. Call your doctor or health care professional for advice if you get a fever, chills or sore throat, or other symptoms of a cold or flu. Do not treat yourself. This drug decreases your body's ability to fight infections. Try to avoid being around people who are sick. This medicine may increase your risk to bruise or bleed. Call your doctor or health care professional if you notice any unusual bleeding. Be careful brushing and flossing your teeth or using a toothpick because you may get an infection or bleed more easily. If you have any dental work done, tell your dentist you are receiving this medicine. Avoid taking products that contain aspirin, acetaminophen, ibuprofen, naproxen, or ketoprofen unless instructed by your doctor. These medicines may hide a fever. Do not become pregnant while taking this medicine. Women should inform their doctor if they wish to become pregnant or think they might be pregnant. There is a potential for serious side effects to an unborn child. Talk to your health care professional or pharmacist for more information. Do not breast-feed an infant while taking this medicine. Men should inform their doctor if they wish to father a child. This medicine may lower sperm counts. Do not treat diarrhea with over the counter products. Contact your doctor if you have diarrhea that lasts more than 2  days or if it is severe and watery. This medicine can make you more sensitive to the sun. Keep out of the sun. If you cannot avoid being in the sun, wear protective clothing and use sunscreen. Do not use sun lamps or tanning beds/booths. What side effects may I notice from receiving this medicine? Side effects that you should report to your doctor or health care professional as soon as possible: -allergic reactions like skin rash, itching or hives, swelling of the face, lips, or tongue -low blood counts - this medicine may decrease the number of white blood cells,  red blood cells and platelets. You may be at increased risk for infections and bleeding. -signs of infection - fever or chills, cough, sore throat, pain or difficulty passing urine -signs of decreased platelets or bleeding - bruising, pinpoint red spots on the skin, black, tarry stools, blood in the urine -signs of decreased red blood cells - unusually weak or tired, fainting spells, lightheadedness -breathing problems -changes in vision -chest pain -mouth sores -nausea and vomiting -pain, swelling, redness at site where injected -pain, tingling, numbness in the hands or feet -redness, swelling, or sores on hands or feet -stomach pain -unusual bleeding Side effects that usually do not require medical attention (report to your doctor or health care professional if they continue or are bothersome): -changes in finger or toe nails -diarrhea -dry or itchy skin -hair loss -headache -loss of appetite -sensitivity of eyes to the light -stomach upset -unusually teary eyes This list may not describe all possible side effects. Call your doctor for medical advice about side effects. You may report side effects to FDA at 1-800-FDA-1088. Where should I keep my medicine? This drug is given in a hospital or clinic and will not be stored at home. NOTE: This sheet is a summary. It may not cover all possible information. If you have questions  about this medicine, talk to your doctor, pharmacist, or health care provider.    2016, Elsevier/Gold Standard. (2008-03-02 13:53:16)

## 2016-02-13 ENCOUNTER — Ambulatory Visit (HOSPITAL_COMMUNITY)
Admission: RE | Admit: 2016-02-13 | Discharge: 2016-02-13 | Disposition: A | Discharge status: Home / Self Care | Payer: Medicare Other | Source: Ambulatory Visit | Attending: Hematology & Oncology | Admitting: Hematology & Oncology

## 2016-02-13 ENCOUNTER — Encounter (HOSPITAL_COMMUNITY): Payer: Medicare Other | Attending: Hematology & Oncology

## 2016-02-13 DIAGNOSIS — R198 Other specified symptoms and signs involving the digestive system and abdomen: Secondary | ICD-10-CM | POA: Diagnosis not present

## 2016-02-13 DIAGNOSIS — C20 Malignant neoplasm of rectum: Secondary | ICD-10-CM

## 2016-02-13 DIAGNOSIS — K6289 Other specified diseases of anus and rectum: Secondary | ICD-10-CM

## 2016-02-13 MED ORDER — SODIUM CHLORIDE 0.9% FLUSH: 10.0000 mL | Freq: Two times a day (BID) | INTRAVENOUS | Status: DC

## 2016-02-13 MED ORDER — SODIUM CHLORIDE 0.9% FLUSH
10.0000 mL | INTRAVENOUS | Status: DC | PRN
Start: 2016-02-13 — End: 2016-02-14

## 2016-02-13 NOTE — Progress Notes (Signed)
Peripherally Inserted Central Catheter/Midline Placement  The IV Nurse has discussed with the patient and/or persons authorized to consent for the patient, the purpose of this procedure and the potential benefits and risks involved with this procedure.  The benefits include less needle sticks, lab draws from the catheter and patient may be discharged home with the catheter.  Risks include, but not limited to, infection, bleeding, blood clot (thrombus formation), and puncture of an artery; nerve damage and irregular heat beat.  Alternatives to this procedure were also discussed.  PICC/Midline Placement Documentation  PICC Single Lumen [id number] PICC Right Basilic 33 cm 0 cm (Active)  Indication for Insertion or Continuance of Line Prolonged intravenous therapies 02/13/2016  2:00 PM  Exposed Catheter (cm) 0 cm 02/13/2016  2:00 PM  Site Assessment Clean;Dry;Intact 02/13/2016  2:00 PM  Line Status Flushed;Saline locked;Blood return noted 02/13/2016  2:00 PM  Dressing Type Transparent 02/13/2016  2:00 PM  Dressing Status Clean;Dry;Intact;Antimicrobial disc in place 02/13/2016  2:00 PM  Line Care Connections checked and tightened 02/13/2016  2:00 PM  Dressing Intervention New dressing 02/13/2016  2:00 PM  Dressing Change Due 02/20/16 02/13/2016  2:00 PM       Hillery Jacks 02/13/2016, 2:04 PM

## 2016-02-13 NOTE — Progress Notes (Signed)
Chemo teaching done and consent signed for 5FU.Calendar given to patient and family members. Distress screening done.

## 2016-02-13 NOTE — Discharge Instructions (Signed)
PICC Insertion, Care After °Refer to this sheet in the next few weeks. These instructions provide you with information on caring for yourself after your procedure. Your health care provider may also give you more specific instructions. Your treatment has been planned according to current medical practices, but problems sometimes occur. Call your health care provider if you have any problems or questions after your procedure. °WHAT TO EXPECT AFTER THE PROCEDURE °After your procedure, it is typical to have the following: °· Mild discomfort at the insertion site. This should not last more than a day. °HOME CARE INSTRUCTIONS °· Rest at home for the remainder of the day after the procedure. °· You may bend your arm and move it freely. If your PICC is near or at the bend of your elbow, avoid activity with repeated motion at the elbow. °· Avoid lifting heavy objects as instructed by your health care provider. °· Avoid using a crutch with the arm on the same side as your PICC. You may need to use a walker. °Bandage Care °· Keep your PICC bandage (dressing) clean and dry to prevent infection. °· Ask your health care provider when you may shower. To keep the dressing dry, cover the PICC with plastic wrap and tape before showering. If the dressing does become wet, replace it right after the shower. °· Do not soak in the bath, swim, or use hot tubs when you have a PICC. °· Change the PICC dressing as instructed by your health care provider. °· Change your PICC dressing if it becomes loose or wet. °General PICC Care °· Check the PICC insertion site daily for leakage, redness, swelling, or pain. °· Flush the PICC as directed by your health care provider. Let your health care provider know right away if the PICC is difficult to flush or does not flush. Do not use force to flush the PICC. °· Do not use a syringe that is less than 10 mL to flush the PICC. °· Never pull or tug on the PICC. °· Avoid blood pressure checks on the arm  with the PICC. °· Keep your PICC identification card with you at all times. °· Do not take the PICC out yourself. Only a trained health care professional should remove the PICC. °SEEK MEDICAL CARE IF: °· You have pain in your arm, ear, face, or teeth. °· You have fever or chills. °· You have drainage from the PICC insertion site. °· You have redness or palpate a "cord" around the PICC insertion site. °· You cannot flush the catheter. °SEEK IMMEDIATE MEDICAL CARE IF: °· You have swelling in the arm in which the PICC is inserted. °  °This information is not intended to replace advice given to you by your health care provider. Make sure you discuss any questions you have with your health care provider. °  °Document Released: 08/18/2013 Document Revised: 11/02/2013 Document Reviewed: 08/18/2013 °Elsevier Interactive Patient Education ©2016 [employer]. ° °

## 2016-02-16 ENCOUNTER — Encounter (HOSPITAL_BASED_OUTPATIENT_CLINIC_OR_DEPARTMENT_OTHER): Payer: Medicare Other

## 2016-02-16 DIAGNOSIS — C20 Malignant neoplasm of rectum: Secondary | ICD-10-CM | POA: Diagnosis not present

## 2016-02-16 DIAGNOSIS — Z452 Encounter for adjustment and management of vascular access device: Secondary | ICD-10-CM | POA: Diagnosis not present

## 2016-02-16 DIAGNOSIS — C19 Malignant neoplasm of rectosigmoid junction: Secondary | ICD-10-CM | POA: Diagnosis not present

## 2016-02-16 DIAGNOSIS — Z510 Encounter for antineoplastic radiation therapy: Secondary | ICD-10-CM | POA: Diagnosis not present

## 2016-02-16 MED ORDER — HEPARIN SOD (PORK) LOCK FLUSH 100 UNIT/ML IV SOLN
INTRAVENOUS | Status: AC
  Filled 2016-02-16: qty 5

## 2016-02-16 MED ORDER — SODIUM CHLORIDE 0.9% FLUSH
10.0000 mL | Freq: Once | INTRAVENOUS | Status: AC
  Administered 2016-02-16: 10 mL via INTRAVENOUS

## 2016-02-16 MED ORDER — HEPARIN SOD (PORK) LOCK FLUSH 100 UNIT/ML IV SOLN
300.0000 [IU] | Freq: Once | INTRAVENOUS | Status: AC
  Administered 2016-02-16: 300 [IU] via INTRAVENOUS

## 2016-02-16 NOTE — Progress Notes (Unsigned)
Meghan Anderson presented for PICC line flush. Proper placement of PICC confirmed by CXR. PICC line located RUA . Good blood return present. PICC line flushed with 15ml NS and 300U/20ml Heparin. Procedure without incident. Patient tolerated procedure well.

## 2016-02-19 ENCOUNTER — Encounter (HOSPITAL_COMMUNITY): Payer: Medicare Other

## 2016-02-19 ENCOUNTER — Encounter (HOSPITAL_BASED_OUTPATIENT_CLINIC_OR_DEPARTMENT_OTHER): Payer: Medicare Other

## 2016-02-19 VITALS — BP 162/89 | HR 88 | Temp 98.2°F | Resp 20

## 2016-02-19 DIAGNOSIS — C20 Malignant neoplasm of rectum: Secondary | ICD-10-CM | POA: Diagnosis not present

## 2016-02-19 DIAGNOSIS — Z452 Encounter for adjustment and management of vascular access device: Secondary | ICD-10-CM

## 2016-02-19 MED ORDER — HEPARIN SOD (PORK) LOCK FLUSH 100 UNIT/ML IV SOLN
300.0000 [IU] | Freq: Once | INTRAVENOUS | Status: AC
  Administered 2016-02-19: 300 [IU] via INTRAVENOUS

## 2016-02-19 MED ORDER — HEPARIN SOD (PORK) LOCK FLUSH 100 UNIT/ML IV SOLN
INTRAVENOUS | Status: AC
  Filled 2016-02-19: qty 5

## 2016-02-19 MED ORDER — SODIUM CHLORIDE 0.9% FLUSH
20.0000 mL | INTRAVENOUS | Status: DC | PRN
  Administered 2016-02-19: 20 mL via INTRAVENOUS
  Filled 2016-02-19: qty 20

## 2016-02-19 NOTE — Patient Instructions (Signed)
[place] at [facility] Discharge Instructions  RECOMMENDATIONS MADE BY THE CONSULTANT AND ANY TEST RESULTS WILL BE SENT TO YOUR REFERRING PHYSICIAN.  PICC dressing change.    Thank you for choosing [place] at [facility] to provide your oncology and hematology care.  To afford each patient quality time with our provider, please arrive at least 15 minutes before your scheduled appointment time.   Beginning January 23rd 2017 lab work for the [facility] will be done in the  Main lab at [facility] on 1st floor. If you have a lab appointment with the [facility] please come in thru the  Main Entrance and check in at the main information desk  You need to re-schedule your appointment should you arrive 10 or more minutes late.  We strive to give you quality time with our providers, and arriving late affects you and other patients whose appointments are after yours.  Also, if you no show three or more times for appointments you may be dismissed from the clinic at the providers discretion.     Again, thank you for choosing [facility].  Our hope is that these requests will decrease the amount of time that you wait before being seen by our physicians.       _____________________________________________________________  Should you have questions after your visit to [facility], please contact our office at [phone] between the hours of 8:30 a.m. and 4:30 p.m.  Voicemails left after 4:30 p.m. will not be returned until the following business day.  For prescription refill requests, have your pharmacy contact our office.         Resources For Cancer Patients and their Caregivers ? American Cancer Society: Can assist with transportation, wigs, general needs, runs Look Good Feel Better.        ([phone] ? Cancer Care: Provides financial assistance, online support groups, medication/co-pay assistance.   1-800-813-HOPE (931)689-4057) ? [place] Assists [place] cancer patients and their families through emotional , educational and financial support.  806-187-2616 ? [place] DSS Where to apply for food stamps, Medicaid and utility assistance. (669)460-6051 ? RCATS: Transportation to medical appointments. 646-606-8968 ? Social Security Administration: May apply for disability if have a Stage IV cancer. [phone] ? [employer], Disability and Transit Services: Assists with nutrition, care and transit needs. [phone]

## 2016-02-19 NOTE — Progress Notes (Signed)
Sonia Side presented for PICC line flush. Proper placement of PICC confirmed by CXR. PICC line located RUE. Good blood return present. PICC line flushed with 64ml NS and 300U/51ml Heparin. Procedure without incident. Patient tolerated procedure well.  PICC dressing change due to the patient getting it wet this morning.  Insertion site clean and dry, without any s/s infection.

## 2016-02-20 NOTE — Progress Notes (Signed)
[name], MD [place] [address]  Rectal adenocarcinoma [facility])  Chronic renal disease, stage 3, moderately decreased glomerular filtration rate (GFR) between 30-59 mL/min/1.73 square meter  CURRENT THERAPY: 5FU + XRT beginning on 02/21/2016.  INTERVAL HISTORY: Meghan Anderson 80 y.o. female returns for followup of Stage IIA (T3N0M0) rectal adenocarcinoma, S/P resection of rectal mass by Dr. Marcello Moores on 12/14/2015, now undergoing concomitant chemoradiation.    Rectal adenocarcinoma ([place])   11/01/2015 Procedure Colonoscopy by Dr. [name]   11/01/2015 Pathology Results Rectum, biopsy, mass - FRAGMENTS OF TUBULAR ADENOMA WITH HIGH GRADE GLANDULAR DYSPLASIA.   11/07/2015 Imaging CT abd/pelvis- Irregular annular wall thickening in the mid rectum with associated rectal intussusception, presumably representing a primary rectal neoplasm. Borderline prominent upper presacral lymph node, possibly metastatic.   12/14/2015 Definitive Surgery Rectum, resection, rectal mass by Dr. [name]   03/17/4331 Pathology Results Rectum, resection, rectal mass MODERATELY DIFFERENTIATED RECTAL ADENOCARCINOMA (4.3 CM) THE TUMOR INVADES THROUGH THE MUSCULARIS PROPRIA INTO PERICOLORECTAL TISSUE MARGINS OF RESECTION IS POSITIVE   12/14/2015 Pathology Results There is a very low probability that microsatellite instability (MSI) is present.   01/15/2016 Procedure Colonoscopy by Dr. [name]   01/15/2016 Pathology Results 1. Colon, polyp(s), descending - TUBULAR ADENOMA. NO HIGH GRADE DYSPLASIA OR MALIGNANCY IDENTIFIED. 2. Colon, polyp(s), splenic flexure - TUBULAR ADENOMA. NO HIGH GRADE DYSPLASIA OR MALIGNANCY IDENTIFIED. 3. Colon, polyp(s), ascending - TUBULAR ADENO   01/24/2016 Imaging CT abd/pelvis- Nonspecific mild residual rectal wall thickening with associated mild perirectal fat stranding posteriorly in the mid to upper rectum (8-9 cm superior to the anal verge), which could represent postsurgical  changes and/or residual tumor.     I personally reviewed and went over laboratory results with the patient.  The results are noted within this dictation. Labs me treatment parameters today. Of note, hypokalemia is appreciated at 3.2. On her medication list she has K-Dur 20 milliequivalents at home as prescribed by her primary care physician likely secondary to hydrochlorothiazide component to her antihypertensive location regimen. She reports that she has not been taking the medication; "I didn't know if I was supposed to take it every day."  We reviewed the risks, benefits, alternatives, and side effects of 5-FU continuous infusion chemotherapy including, but not limited to, nausea, vomiting, diarrhea, stomatitis, palmar-plantar erythrodysesthesia, decreased appetite, etc.  All questions are answered today.  Past Medical History  Diagnosis Date  . Hypertension   . Hyperlipidemia   . Chronic bronchitis ([place])   . Thyroid disease   . GERD (gastroesophageal reflux disease)   . Arthritis   . Murmur, heart   . Cataract   . Hypothyroidism   . Complication of anesthesia   . PONV (postoperative nausea and vomiting)   . COPD (chronic obstructive pulmonary disease) ([place])   . Rectal adenocarcinoma ([place])   . Chronic renal disease, stage 3, moderately decreased glomerular filtration rate (GFR) between 30-59 mL/min/1.73 square meter 02/21/2016    has COPD (chronic obstructive pulmonary disease) (HCC); GERD (gastroesophageal reflux disease); Essential hypertension, benign; Heart murmur; Arthritis; Osteopenia; Hyperlipidemia; Thoracic aorta atherosclerosis ([place]); Hypothyroidism; Constipation; Loss of weight; Rectal bleeding; Rectal adenocarcinoma ([place]); Diverticulosis of colon without hemorrhage; History of colonic polyps; and Chronic renal disease, stage 3, moderately decreased glomerular filtration rate (GFR) between 30-59 mL/min/1.73 square meter on her problem list.     is allergic to moxifloxacin  hcl in nacl; levaquin; prednisone; and quinolones.  Current Outpatient Prescriptions on File Prior  to Visit  Medication Sig Dispense Refill  . albuterol (PROVENTIL) (2.5 MG/3ML) 0.083% nebulizer solution NEBULIZE 1 VIAL EVERY 4 HOURS AS NEEDED (Patient taking differently: NEBULIZE 1 VIAL EVERY 4 HOURS AS NEEDED FOR SHORTNESS OF BREATHE.) 150 mL 2  . amLODipine (NORVASC) 5 MG tablet Take 1 tablet (5 mg total) by mouth daily. 30 tablet 5  . atorvastatin (LIPITOR) 80 MG tablet Take 1 tablet (80 mg total) by mouth daily. (Patient taking differently: Take 40 mg by mouth daily at 12 noon. ) 90 tablet 3  . dextrose 5 % SOLN 1,000 mL with fluorouracil 5 GM/100ML SOLN Inject into the vein. Daily throughout radiation. Starting 02/21/16    . fluticasone (FLONASE) 50 MCG/ACT nasal spray Place 2 sprays into both nostrils daily as needed. (Patient taking differently: Place 2 sprays into both nostrils daily as needed for allergies or rhinitis. ) 16 g 1  . Fluticasone Furoate-Vilanterol (BREO ELLIPTA) 100-25 MCG/INH AEPB USE 1 PUFF ONCE DAILY    . HYDROcodone-acetaminophen (NORCO/VICODIN) 5-325 MG tablet Take 1-2 tablets by mouth every 4 (four) hours as needed for moderate pain. 30 tablet 0  . levothyroxine (SYNTHROID, LEVOTHROID) 150 MCG tablet Take 1 tablet (150 mcg total) by mouth daily. 90 tablet 3  . lisinopril-hydrochlorothiazide (PRINZIDE,ZESTORETIC) 20-12.5 MG tablet TAKE TWO TABLETS BY MOUTH DAILY 60 tablet 3  . omeprazole (PRILOSEC) 40 MG capsule TAKE ONE (1) CAPSULE EACH DAY 30 capsule 2  . ondansetron (ZOFRAN) 8 MG tablet Take 1 tablet (8 mg total) by mouth every 8 (eight) hours as needed for nausea or vomiting. 30 tablet 2  . polyethylene glycol powder (GLYCOLAX/MIRALAX) powder Take 17 g by mouth 2 (two) times daily as needed. 3350 g 1  . potassium chloride SA (K-DUR,KLOR-CON) 20 MEQ tablet TAKE ONE (1) TABLET EACH DAY 30 tablet 3  . tiotropium (SPIRIVA HANDIHALER) 18 MCG inhalation capsule Place 1  capsule (18 mcg total) into inhaler and inhale daily. 90 capsule 3   Current Facility-Administered Medications on File Prior to Visit  Medication Dose Route Frequency Provider Last Rate Last Dose  . fluorouracil (ADRUCIL) 2,500 mg in sodium chloride 0.9 % 100 mL chemo infusion  225 mg/m2/day Intravenous 7 days B[facility] Cancer, PA-C      . sodium chloride flush (NS) 0.9 % injection 10 mL  10 mL Intracatheter PRN Patrici Ranks, MD        Past Surgical History  Procedure Laterality Date  . Left ear    . Abdominal hysterectomy      complete  . Colonoscopy N/A 11/01/2015    RMR: Large fungating rectal mass precluded colonoscopy. Status post biopsy.   . Transanal endoscopic microsurgery N/A 12/14/2015    Procedure: TRANSANAL ENDOSCOPIC MICROSURGERY OF RECTAL POLYP;  Surgeon: [name], MD;  Location: [facility] ORS;  Service: General;  Laterality: N/A;  . Colonoscopy N/A 01/15/2016    Procedure: COLONOSCOPY;  Surgeon: [name], MD;  Location: [facility] SUITE;  Service: Endoscopy;  Laterality: N/A;  130     Denies any headaches, dizziness, double vision, fevers, chills, night sweats, nausea, vomiting, diarrhea, constipation, chest pain, heart palpitations, shortness of breath, blood in stool, black tarry stool, urinary pain, urinary burning, urinary frequency, hematuria.   PHYSICAL EXAMINATION  ECOG PERFORMANCE STATUS: 0 - Asymptomatic  Filed Vitals:   02/21/16 1138  BP: 158/78  Pulse: 82  Temp: 98 F (36.7 C)  Resp: 18    GENERAL:alert, no distress, well nourished, well developed, comfortable, cooperative, smiling  and accompanied by her son, Marlou Sa, in chemo-recliner prepared for chemotherapy. SKIN: skin color, texture, turgor are normal, no rashes or significant lesions, darkened skin throughout HEAD: Normocephalic, No masses, lesions, tenderness or abnormalities EYES: normal, EOMI, Conjunctiva are pink and non-injected EARS: External ears normal OROPHARYNX:lips, buccal mucosa,  and tongue normal and mucous membranes are moist  NECK: supple, trachea midline LYMPH:  no palpable lymphadenopathy BREAST:not examined LUNGS: clear to auscultation  HEART: regular rate & rhythm, no murmurs, no gallops, S1 normal and S2 normal ABDOMEN:abdomen soft, non-tender and normal bowel sounds BACK: Back symmetric, no curvature., No CVA tenderness EXTREMITIES:less then 2 second capillary refill, no joint deformities, effusion, or inflammation, no skin discoloration, no cyanosis  NEURO: alert & oriented x 3 with fluent speech, no focal motor/sensory deficits, gait normal   LABORATORY DATA: CBC    Component Value Date/Time   WBC 6.5 02/21/2016 1040   WBC 5.1 11/07/2015 1255   RBC 3.51* 02/21/2016 1040   RBC 3.79 11/07/2015 1255   HGB 10.6* 02/21/2016 1040   HCT 32.1* 02/21/2016 1040   HCT 33.1* 11/07/2015 1255   PLT 223 02/21/2016 1040   PLT 248 11/07/2015 1255   MCV 91.5 02/21/2016 1040   MCV 87 11/07/2015 1255   MCH 30.2 02/21/2016 1040   MCH 29.3 11/07/2015 1255   MCHC 33.0 02/21/2016 1040   MCHC 33.5 11/07/2015 1255   RDW 15.5 02/21/2016 1040   RDW 15.6* 11/07/2015 1255   LYMPHSABS 0.9 02/21/2016 1040   LYMPHSABS 0.8 11/07/2015 1255   MONOABS 0.5 02/21/2016 1040   EOSABS 0.1 02/21/2016 1040   EOSABS 0.1 11/07/2015 1255   BASOSABS 0.0 02/21/2016 1040   BASOSABS 0.0 11/07/2015 1255      Chemistry      Component Value Date/Time   NA 139 02/21/2016 1040   NA 140 11/07/2015 1255   K 3.2* 02/21/2016 1040   CL 108 02/21/2016 1040   CO2 25 02/21/2016 1040   BUN 17 02/21/2016 1040   BUN 14 11/07/2015 1255   CREATININE 1.22* 02/21/2016 1040      Component Value Date/Time   CALCIUM 8.1* 02/21/2016 1040   ALKPHOS 62 02/21/2016 1040   AST 17 02/21/2016 1040   ALT 11* 02/21/2016 1040   BILITOT 0.5 02/21/2016 1040   BILITOT 0.2 12/29/2014 1155        PENDING LABS:   RADIOGRAPHIC STUDIES:  Ct Pelvis W Contrast  01/24/2016  CLINICAL DATA:  80 year old  female status post minimally invasive trans anal excision of rectal adenocarcinoma on 12/14/2015 with positive margins, presenting for treatment planning. EXAM: CT PELVIS WITH CONTRAST TECHNIQUE: Multidetector CT imaging of the pelvis was performed using the standard protocol following the bolus administration of intravenous contrast. CONTRAST:  46m OMNIPAQUE IOHEXOL 300 MG/ML  SOLN COMPARISON:  11/07/2015 CT abdomen/pelvis. FINDINGS: Reproductive: Status post hysterectomy, with no abnormal findings at the vaginal cuff. No adnexal mass. Bladder: Normal. The urethra appears grossly normal. Bowel: There is no residual rectal intussusception. There is mild residual wall thickening in the mid to upper rectum (approximately 8-9 cm superior to the anal verge) with associated mild perirectal fat stranding posteriorly (series 4/image 64 and series 5/ image 54), without discrete rectal or perirectal mass or fluid collection. There is moderate sigmoid diverticulosis, with no sigmoid wall thickening or perisigmoid fat stranding to suggest acute diverticulitis. Normal appendix. Visualized small bowel and proximal colon are normal caliber without wall thickening. Vascular/Lymphatic: No pathologically enlarged lymph nodes in the pelvis. Tiny presacral  lymph nodes, largest 0.4 cm, within normal size limits. Atherosclerotic visualized aortic bifurcation and iliac vessels with no aneurysm on this study. Other: No pneumoperitoneum, ascites or focal fluid collection. Musculoskeletal: No aggressive appearing focal osseous lesions. Marked degenerative changes in the visualized lower lumbar spine. IMPRESSION: 1. Nonspecific mild residual rectal wall thickening with associated mild perirectal fat stranding posteriorly in the mid to upper rectum (8-9 cm superior to the anal verge), which could represent postsurgical changes and/or residual tumor. 2. No pelvic lymphadenopathy. No evidence of metastatic disease in the pelvis. 3. Moderate  sigmoid diverticulosis. Electronically Signed   By: Ilona Sorrel M.D.   On: 01/24/2016 09:10     PATHOLOGY:    ASSESSMENT AND PLAN:  Rectal adenocarcinoma (HCC) Stage IIA (T3N0M0) rectal adenocarcinoma, S/P resection of rectal mass by Dr. Marcello Moores on 12/14/2015, now undergoing concomitant chemoradiation.  Oncology history developed.  Staging in [facility] problem list completed ~ 2 weeks ago.  She will begin 5 FU today given in a daily fashion during XRT.  Return in 1 week for follow-up as scheduled, and next cycle of chemotherapy.  Hypokalemia is noted and will be replaced in the outpatient setting.  She admits to not taking her home Kdur 20 mEq as prescribed by her primary care provider in the setting of BP control using a HCTZ containing antihypertensive.  "I didn't know if I was suppose to take it every day or not."  I have asked her to take her potassium twice daily (40 mEq) for the next week.  We will recheck her K+ level next week with cycle #2 of treatment and adjust the dose of Kdur accordingly.    THERAPY PLAN:  Following Adjuvant therapy as outlined above (5FU CI + XRT), she will be considered for further surgical intervention due to positive margins at time of surgery.  All questions were answered. The patient knows to call the clinic with any problems, questions or concerns. We can certainly see the patient much sooner if necessary.  Patient and plan discussed with Dr. Ancil Linsey and she is in agreement with the aforementioned.   This note is electronically signed by: Doy Mince 02/21/2016 12:34 PM

## 2016-02-20 NOTE — Assessment & Plan Note (Addendum)
Stage IIA (T3N0M0) rectal adenocarcinoma, S/P resection of rectal mass by Dr. Marcello Moores on 12/14/2015, now undergoing concomitant chemoradiation.  Oncology history developed.  Staging in [facility] problem list completed ~ 2 weeks ago.  She will begin 5 FU today given in a daily fashion during XRT.  Return in 1 week for follow-up as scheduled, and next cycle of chemotherapy.  Hypokalemia is noted and will be replaced in the outpatient setting.  She admits to not taking her home Kdur 20 mEq as prescribed by her primary care provider in the setting of BP control using a HCTZ containing antihypertensive.  "I didn't know if I was suppose to take it every day or not."  I have asked her to take her potassium twice daily (40 mEq) for the next week.  We will recheck her K+ level next week with cycle #2 of treatment and adjust the dose of Kdur accordingly.

## 2016-02-21 ENCOUNTER — Encounter (HOSPITAL_BASED_OUTPATIENT_CLINIC_OR_DEPARTMENT_OTHER): Payer: Medicare Other

## 2016-02-21 ENCOUNTER — Encounter (HOSPITAL_COMMUNITY): Payer: Self-pay | Admitting: Oncology

## 2016-02-21 ENCOUNTER — Encounter (HOSPITAL_BASED_OUTPATIENT_CLINIC_OR_DEPARTMENT_OTHER): Payer: Medicare Other | Admitting: Oncology

## 2016-02-21 VITALS — BP 154/69 | HR 85 | Temp 98.2°F | Resp 16 | Wt 123.6 lb

## 2016-02-21 VITALS — BP 158/78 | HR 82 | Temp 98.0°F | Resp 18

## 2016-02-21 DIAGNOSIS — C20 Malignant neoplasm of rectum: Secondary | ICD-10-CM | POA: Diagnosis not present

## 2016-02-21 DIAGNOSIS — Z5111 Encounter for antineoplastic chemotherapy: Secondary | ICD-10-CM | POA: Diagnosis present

## 2016-02-21 DIAGNOSIS — N183 Chronic kidney disease, stage 3 unspecified: Secondary | ICD-10-CM | POA: Insufficient documentation

## 2016-02-21 DIAGNOSIS — Z510 Encounter for antineoplastic radiation therapy: Secondary | ICD-10-CM | POA: Diagnosis not present

## 2016-02-21 DIAGNOSIS — E876 Hypokalemia: Secondary | ICD-10-CM

## 2016-02-21 DIAGNOSIS — C19 Malignant neoplasm of rectosigmoid junction: Secondary | ICD-10-CM | POA: Diagnosis not present

## 2016-02-21 HISTORY — DX: Chronic kidney disease, stage 3 unspecified: N18.30

## 2016-02-21 LAB — COMPREHENSIVE METABOLIC PANEL
ALBUMIN: 3.2 g/dL — AB (ref 3.5–5.0)
ALK PHOS: 62 U/L (ref 38–126)
ALT: 11 U/L — AB (ref 14–54)
AST: 17 U/L (ref 15–41)
Anion gap: 6 (ref 5–15)
BUN: 17 mg/dL (ref 6–20)
CALCIUM: 8.1 mg/dL — AB (ref 8.9–10.3)
CO2: 25 mmol/L (ref 22–32)
CREATININE: 1.22 mg/dL — AB (ref 0.44–1.00)
Chloride: 108 mmol/L (ref 101–111)
GFR calc Af Amer: 48 mL/min — ABNORMAL LOW (ref >60)
GFR calc non Af Amer: 42 mL/min — ABNORMAL LOW (ref >60)
Glucose, Bld: 152 mg/dL — ABNORMAL HIGH (ref 65–99)
Potassium: 3.2 mmol/L — ABNORMAL LOW (ref 3.5–5.1)
SODIUM: 139 mmol/L (ref 135–145)
Total Bilirubin: 0.5 mg/dL (ref 0.3–1.2)
Total Protein: 6.4 g/dL — ABNORMAL LOW (ref 6.5–8.1)

## 2016-02-21 LAB — CBC WITH DIFFERENTIAL/PLATELET
BASOS PCT: 1 %
Basophils Absolute: 0 10*3/uL (ref 0.0–0.1)
EOS ABS: 0.1 10*3/uL (ref 0.0–0.7)
Eosinophils Relative: 2 %
HCT: 32.1 % — ABNORMAL LOW (ref 36.0–46.0)
HEMOGLOBIN: 10.6 g/dL — AB (ref 12.0–15.0)
LYMPHS ABS: 0.9 10*3/uL (ref 0.7–4.0)
Lymphocytes Relative: 14 %
MCH: 30.2 pg (ref 26.0–34.0)
MCHC: 33 g/dL (ref 30.0–36.0)
MCV: 91.5 fL (ref 78.0–100.0)
Monocytes Absolute: 0.5 10*3/uL (ref 0.1–1.0)
Monocytes Relative: 7 %
NEUTROS PCT: 76 %
Neutro Abs: 5 10*3/uL (ref 1.7–7.7)
Platelets: 223 10*3/uL (ref 150–400)
RBC: 3.51 MIL/uL — AB (ref 3.87–5.11)
RDW: 15.5 % (ref 11.5–15.5)
WBC: 6.5 10*3/uL (ref 4.0–10.5)

## 2016-02-21 MED ORDER — SODIUM CHLORIDE 0.9 % IV SOLN
225.0000 mg/m2/d | INTRAVENOUS | Status: DC
  Administered 2016-02-21: 2500 mg via INTRAVENOUS
  Filled 2016-02-21: qty 50

## 2016-02-21 MED ORDER — SODIUM CHLORIDE 0.9 % IV SOLN: 225.0000 mg/m2/d | INTRAVENOUS | Status: DC

## 2016-02-21 MED ORDER — SODIUM CHLORIDE 0.9% FLUSH: 10.0000 mL | INTRAVENOUS | Status: DC | PRN

## 2016-02-21 MED ORDER — CYANOCOBALAMIN 1000 MCG/ML IJ SOLN
INTRAMUSCULAR | Status: AC
  Filled 2016-02-21: qty 1

## 2016-02-21 NOTE — Progress Notes (Signed)
Patient and son educated on continuous infusion pump. Verbalized understanding. Ambulatory on discharge home with pump intact.

## 2016-02-21 NOTE — Patient Instructions (Signed)
[facility] Acute Care Discharge Instructions for Patients Receiving Chemotherapy   Beginning January 23rd 2017 lab work for the [facility] will be done in the  Main lab at [facility] on 1st floor. If you have a lab appointment with the [place] please come in thru the  Main Entrance and check in at the main information desk   Today you received the following chemotherapy agents 5FU continuous infusion pump.  To help prevent nausea and vomiting after your treatment, we encourage you to take your nausea medication as instructed.   If you develop nausea and vomiting, or diarrhea that is not controlled by your medication, call the clinic.  The clinic phone number is [phone]. Office hours are Monday-Friday 8:30am-5:00pm.  BELOW ARE SYMPTOMS THAT SHOULD BE REPORTED IMMEDIATELY:  *FEVER GREATER THAN 101.0 F  *CHILLS WITH OR WITHOUT FEVER  NAUSEA AND VOMITING THAT IS NOT CONTROLLED WITH YOUR NAUSEA MEDICATION  *UNUSUAL SHORTNESS OF BREATH  *UNUSUAL BRUISING OR BLEEDING  TENDERNESS IN MOUTH AND THROAT WITH OR WITHOUT PRESENCE OF ULCERS  *URINARY PROBLEMS  *BOWEL PROBLEMS  UNUSUAL RASH Items with * indicate a potential emergency and should be followed up as soon as possible. If you have an emergency after office hours please contact your primary care physician or go to the nearest emergency department.  Please call the clinic during office hours if you have any questions or concerns.   You may also contact the Patient Navigator at [phone] should you have any questions or need assistance in obtaining follow up care.   Resources For Cancer Patients and their Caregivers ? American Cancer Society: Can assist with transportation, wigs, general needs, runs Look Good Feel Better.        ([phone] ? Cancer Care: Provides financial assistance, online support groups, medication/co-pay assistance.  1-800-813-HOPE (332)176-6399) ? [place] Assists [place] cancer patients and their families through emotional , educational and financial support.  919-304-1376 ? [place] DSS Where to apply for food stamps, Medicaid and utility assistance. 814-253-2466 ? RCATS: Transportation to medical appointments. 989-381-4528 ? Social Security Administration: May apply for disability if have a Stage IV cancer. [phone] ? [employer], Disability and Transit Services: Assists with nutrition, care and transit needs. [phone]

## 2016-02-21 NOTE — Patient Instructions (Signed)
[employer] at [facility] Discharge Instructions  RECOMMENDATIONS MADE BY THE CONSULTANT AND ANY TEST RESULTS WILL BE SENT TO YOUR REFERRING PHYSICIAN.  Today is day 1 of treatment.  Your labs today are very stable and some are improved.  You labs meet parameters for chemotherapy today as planned. Your potassium is a little low today.  I recommend you take TWO of your potassium tablets daily for about 1 week.  We will recheck you potassium level when you return next week and make adjustments to your potassium dose accordingly. Return in 1 week for follow-up and next cycle of chemotherapy.  Please call us with any questions or concerns related to your cancer care.  Thank you for choosing [place] at [facility] to provide your oncology and hematology care.  To afford each patient quality time with our provider, please arrive at least 15 minutes before your scheduled appointment time.   Beginning January 23rd 2017 lab work for the [facility] will be done in the  Main lab at [facility] on 1st floor. If you have a lab appointment with the [facility] please come in thru the  Main Entrance and check in at the main information desk  You need to re-schedule your appointment should you arrive 10 or more minutes late.  We strive to give you quality time with our providers, and arriving late affects you and other patients whose appointments are after yours.  Also, if you no show three or more times for appointments you may be dismissed from the clinic at the providers discretion.     Again, thank you for choosing [facility].  Our hope is that these requests will decrease the amount of time that you wait before being seen by our physicians.       _____________________________________________________________  Should you have questions after your visit to [facility], please contact our office at [phone] between the hours  of 8:30 a.m. and 4:30 p.m.  Voicemails left after 4:30 p.m. will not be returned until the following business day.  For prescription refill requests, have your pharmacy contact our office.         Resources For Cancer Patients and their Caregivers ? American Cancer Society: Can assist with transportation, wigs, general needs, runs Look Good Feel Better.        317-131-1732 ? Cancer Care: Provides financial assistance, online support groups, medication/co-pay assistance.  1-800-813-HOPE 559-624-3402) ? [place] Assists [place] cancer patients and their families through emotional , educational and financial support.  (770)404-6901 ? [place] DSS Where to apply for food stamps, Medicaid and utility assistance. 220-061-0627 ? RCATS: Transportation to medical appointments. ([phone] ? Social Security Administration: May apply for disability if have a Stage IV cancer. [phone] 203-272-4949 ? [employer], Disability and Transit Services: Assists with nutrition, care and transit needs. [phone]

## 2016-02-22 ENCOUNTER — Telehealth (HOSPITAL_COMMUNITY): Payer: Self-pay | Admitting: *Deleted

## 2016-02-22 DIAGNOSIS — Z510 Encounter for antineoplastic radiation therapy: Secondary | ICD-10-CM | POA: Diagnosis not present

## 2016-02-22 DIAGNOSIS — C19 Malignant neoplasm of rectosigmoid junction: Secondary | ICD-10-CM | POA: Diagnosis not present

## 2016-02-22 NOTE — Telephone Encounter (Signed)
Unable to reach patient or her son Coralyn Mark. Left message on home answering machine as well as Terry's cell phone. Encouraged to call Hildred Alamin if having any issues/concerns with chemo pump.

## 2016-02-26 DIAGNOSIS — C19 Malignant neoplasm of rectosigmoid junction: Secondary | ICD-10-CM | POA: Diagnosis not present

## 2016-02-26 DIAGNOSIS — Z510 Encounter for antineoplastic radiation therapy: Secondary | ICD-10-CM | POA: Diagnosis not present

## 2016-02-27 DIAGNOSIS — Z510 Encounter for antineoplastic radiation therapy: Secondary | ICD-10-CM | POA: Diagnosis not present

## 2016-02-27 DIAGNOSIS — C19 Malignant neoplasm of rectosigmoid junction: Secondary | ICD-10-CM | POA: Diagnosis not present

## 2016-02-27 NOTE — Assessment & Plan Note (Addendum)
Stage IIA (T3N0M0) rectal adenocarcinoma, S/P resection of rectal mass by Dr. Marcello Moores on 12/14/2015, now undergoing concomitant chemoradiation.  Oncology history updated.  K+ is WNL.  She will go back to [place] once daily.  Labs meet treatment parameters today and therefore, she is cleared for her next 5FU infusion pump.  She denies any nausea/vomiting, stomatitis, changes in bowel habits, diarrhea/loose stools.  She is doing well at this time.  She notes some insomnia and issues with sleeping.  She reports that Benadryl has the paradoxical effect of hyperactivity in her.  Therefore, I have prescribed low-dose Restoril 15 mg at HS.  Rx is printed for the patient today.  Return in 1 week for follow-up as scheduled, and next cycle of chemotherapy.

## 2016-02-27 NOTE — Progress Notes (Signed)
[name], MD [place] [address]  Rectal adenocarcinoma [facility])  Insomnia - Plan: temazepam (RESTORIL) 15 MG capsule  CURRENT THERAPY: 5FU + XRT beginning on 02/21/2016.  INTERVAL HISTORY: Meghan Anderson 79 y.o. female returns for followup of Stage IIA (T3N0M0) rectal adenocarcinoma, S/P resection of rectal mass by Dr. Marcello Moores on 12/14/2015, now undergoing concomitant chemoradiation.    Rectal adenocarcinoma ([place])   11/01/2015 Procedure Colonoscopy by Dr. [name]   11/01/2015 Pathology Results Rectum, biopsy, mass - FRAGMENTS OF TUBULAR ADENOMA WITH HIGH GRADE GLANDULAR DYSPLASIA.   11/07/2015 Imaging CT abd/pelvis- Irregular annular wall thickening in the mid rectum with associated rectal intussusception, presumably representing a primary rectal neoplasm. Borderline prominent upper presacral lymph node, possibly metastatic.   12/14/2015 Definitive Surgery Rectum, resection, rectal mass by Dr. [name]   07/19/9891 Pathology Results Rectum, resection, rectal mass MODERATELY DIFFERENTIATED RECTAL ADENOCARCINOMA (4.3 CM) THE TUMOR INVADES THROUGH THE MUSCULARIS PROPRIA INTO PERICOLORECTAL TISSUE MARGINS OF RESECTION IS POSITIVE   12/14/2015 Pathology Results There is a very low probability that microsatellite instability (MSI) is present.   01/15/2016 Procedure Colonoscopy by Dr. [name]   01/15/2016 Pathology Results 1. Colon, polyp(s), descending - TUBULAR ADENOMA. NO HIGH GRADE DYSPLASIA OR MALIGNANCY IDENTIFIED. 2. Colon, polyp(s), splenic flexure - TUBULAR ADENOMA. NO HIGH GRADE DYSPLASIA OR MALIGNANCY IDENTIFIED. 3. Colon, polyp(s), ascending - TUBULAR ADENO   01/24/2016 Imaging CT abd/pelvis- Nonspecific mild residual rectal wall thickening with associated mild perirectal fat stranding posteriorly in the mid to upper rectum (8-9 cm superior to the anal verge), which could represent postsurgical changes and/or residual tumor.    02/20/2016 -  Chemotherapy 5FU CI with  XRT   02/20/2016 -  Radiation Therapy Dr. Pablo Ledger    I personally reviewed and went over laboratory results with the patient.  The results are noted within this dictation.  Labs satisfy treatment parameters today.  She denies any mouth sores, diarrhea, erythema of the palms of hands and soles of feet, pain of palms of hands and soles of feet, nausea, vomiting, etc. She continues to tolerate treatment very well.  She does note some issues with sleeping at night. She admits to paradoxical hyperactivity with diphenhydramine. As a result, we can try low-dose prescription strength sleeping aids.  All questions are answered today.  Past Medical History  Diagnosis Date  . Hypertension   . Hyperlipidemia   . Chronic bronchitis ([place])   . Thyroid disease   . GERD (gastroesophageal reflux disease)   . Arthritis   . Murmur, heart   . Cataract   . Hypothyroidism   . Complication of anesthesia   . PONV (postoperative nausea and vomiting)   . COPD (chronic obstructive pulmonary disease) ([place])   . Rectal adenocarcinoma ([place])   . Chronic renal disease, stage 3, moderately decreased glomerular filtration rate (GFR) between 30-59 mL/min/1.73 square meter 02/21/2016    has COPD (chronic obstructive pulmonary disease) (HCC); GERD (gastroesophageal reflux disease); Essential hypertension, benign; Heart murmur; Arthritis; Osteopenia; Hyperlipidemia; Thoracic aorta atherosclerosis ([place]); Hypothyroidism; Constipation; Loss of weight; Rectal bleeding; Rectal adenocarcinoma ([place]); Diverticulosis of colon without hemorrhage; History of colonic polyps; and Chronic renal disease, stage 3, moderately decreased glomerular filtration rate (GFR) between 30-59 mL/min/1.73 square meter on her problem list.     is allergic to moxifloxacin hcl in nacl; levaquin; prednisone; and quinolones.  Current Outpatient Prescriptions on File Prior to Visit  Medication Sig Dispense Refill  . albuterol (PROVENTIL) (  2.5 MG/3ML)  0.083% nebulizer solution NEBULIZE 1 VIAL EVERY 4 HOURS AS NEEDED (Patient taking differently: NEBULIZE 1 VIAL EVERY 4 HOURS AS NEEDED FOR SHORTNESS OF BREATHE.) 150 mL 2  . amLODipine (NORVASC) 5 MG tablet Take 1 tablet (5 mg total) by mouth daily. 30 tablet 5  . atorvastatin (LIPITOR) 80 MG tablet Take 1 tablet (80 mg total) by mouth daily. (Patient taking differently: Take 40 mg by mouth daily at 12 noon. ) 90 tablet 3  . dextrose 5 % SOLN 1,000 mL with fluorouracil 5 GM/100ML SOLN Inject into the vein. Daily throughout radiation. Starting 02/21/16    . fluticasone (FLONASE) 50 MCG/ACT nasal spray Place 2 sprays into both nostrils daily as needed. (Patient taking differently: Place 2 sprays into both nostrils daily as needed for allergies or rhinitis. ) 16 g 1  . Fluticasone Furoate-Vilanterol (BREO ELLIPTA) 100-25 MCG/INH AEPB USE 1 PUFF ONCE DAILY    . HYDROcodone-acetaminophen (NORCO/VICODIN) 5-325 MG tablet Take 1-2 tablets by mouth every 4 (four) hours as needed for moderate pain. 30 tablet 0  . levothyroxine (SYNTHROID, LEVOTHROID) 150 MCG tablet Take 1 tablet (150 mcg total) by mouth daily. 90 tablet 3  . lisinopril-hydrochlorothiazide (PRINZIDE,ZESTORETIC) 20-12.5 MG tablet TAKE TWO TABLETS BY MOUTH DAILY 60 tablet 3  . omeprazole (PRILOSEC) 40 MG capsule TAKE ONE (1) CAPSULE EACH DAY 30 capsule 2  . ondansetron (ZOFRAN) 8 MG tablet Take 1 tablet (8 mg total) by mouth every 8 (eight) hours as needed for nausea or vomiting. 30 tablet 2  . polyethylene glycol powder (GLYCOLAX/MIRALAX) powder Take 17 g by mouth 2 (two) times daily as needed. 3350 g 1  . potassium chloride SA (K-DUR,KLOR-CON) 20 MEQ tablet TAKE ONE (1) TABLET EACH DAY 30 tablet 3  . tiotropium (SPIRIVA HANDIHALER) 18 MCG inhalation capsule Place 1 capsule (18 mcg total) into inhaler and inhale daily. 90 capsule 3   Current Facility-Administered Medications on File Prior to Visit  Medication Dose Route Frequency Provider Last  Rate Last Dose  . fluorouracil (ADRUCIL) 2,500 mg in sodium chloride 0.9 % 100 mL chemo infusion  225 mg/m2/day (Treatment Plan Actual) Intravenous 7 days Patrici Ranks, MD   2,500 mg at 02/28/16 1239    Past Surgical History  Procedure Laterality Date  . Left ear    . Abdominal hysterectomy      complete  . Colonoscopy N/A 11/01/2015    RMR: Large fungating rectal mass precluded colonoscopy. Status post biopsy.   . Transanal endoscopic microsurgery N/A 12/14/2015    Procedure: TRANSANAL ENDOSCOPIC MICROSURGERY OF RECTAL POLYP;  Surgeon: [name], MD;  Location: [facility];  Service: General;  Laterality: N/A;  . Colonoscopy N/A 01/15/2016    Procedure: COLONOSCOPY;  Surgeon: [name], MD;  Location: [facility];  Service: Endoscopy;  Laterality: N/A;  130     Denies any headaches, dizziness, double vision, fevers, chills, night sweats, nausea, vomiting, diarrhea, constipation, chest pain, heart palpitations, shortness of breath, blood in stool, black tarry stool, urinary pain, urinary burning, urinary frequency, hematuria.   PHYSICAL EXAMINATION  ECOG PERFORMANCE STATUS: 0 - Asymptomatic  Filed Vitals:   02/28/16 1100  BP: 174/86  Pulse: 80  Temp: 97.8 F (36.6 C)  Resp: 18    GENERAL:alert, no distress, well nourished, well developed, comfortable, cooperative, smiling and accompanied by her daughter. SKIN: skin texture, turgor are normal, no rashes or significant lesions, darken skin throughout HEAD: Normocephalic, No masses, lesions, tenderness or abnormalities EYES:  normal, EOMI, Conjunctiva are pink and non-injected EARS: External ears normal OROPHARYNX:lips, buccal mucosa, and tongue normal and mucous membranes are moist  NECK: supple, trachea midline LYMPH:  no palpable lymphadenopathy BREAST:not examined LUNGS: clear to auscultation bilaterally without wheezes, rales, or rhonchi. HEART: regular rate & rhythm, no murmurs, no gallops, S1 normal and S2  normal ABDOMEN:abdomen soft, non-tender and normal bowel sounds BACK: Back symmetric, no curvature., No CVA tenderness EXTREMITIES:less then 2 second capillary refill, no joint deformities, effusion, or inflammation, no skin discoloration, no cyanosis  NEURO: alert & oriented x 3 with fluent speech, no focal motor/sensory deficits, gait normal   LABORATORY DATA: CBC    Component Value Date/Time   WBC 5.0 02/28/2016 1027   WBC 5.1 11/07/2015 1255   RBC 3.38* 02/28/2016 1027   RBC 3.79 11/07/2015 1255   HGB 10.6* 02/28/2016 1027   HCT 31.3* 02/28/2016 1027   HCT 33.1* 11/07/2015 1255   PLT 209 02/28/2016 1027   PLT 248 11/07/2015 1255   MCV 92.6 02/28/2016 1027   MCV 87 11/07/2015 1255   MCH 31.4 02/28/2016 1027   MCH 29.3 11/07/2015 1255   MCHC 33.9 02/28/2016 1027   MCHC 33.5 11/07/2015 1255   RDW 15.4 02/28/2016 1027   RDW 15.6* 11/07/2015 1255   LYMPHSABS 0.5* 02/28/2016 1027   LYMPHSABS 0.8 11/07/2015 1255   MONOABS 0.4 02/28/2016 1027   EOSABS 0.1 02/28/2016 1027   EOSABS 0.1 11/07/2015 1255   BASOSABS 0.0 02/28/2016 1027   BASOSABS 0.0 11/07/2015 1255      Chemistry      Component Value Date/Time   NA 140 02/28/2016 1027   NA 140 11/07/2015 1255   K 3.8 02/28/2016 1027   CL 109 02/28/2016 1027   CO2 25 02/28/2016 1027   BUN 23* 02/28/2016 1027   BUN 14 11/07/2015 1255   CREATININE 1.34* 02/28/2016 1027      Component Value Date/Time   CALCIUM 8.3* 02/28/2016 1027   ALKPHOS 55 02/28/2016 1027   AST 13* 02/28/2016 1027   ALT 10* 02/28/2016 1027   BILITOT 0.3 02/28/2016 1027   BILITOT 0.2 12/29/2014 1155        PENDING LABS:   RADIOGRAPHIC STUDIES:  No results found.   PATHOLOGY:    ASSESSMENT AND PLAN:  Rectal adenocarcinoma (HCC) Stage IIA (T3N0M0) rectal adenocarcinoma, S/P resection of rectal mass by Dr. Marcello Moores on 12/14/2015, now undergoing concomitant chemoradiation.  Oncology history updated.  K+ is WNL.  She will go back to [place]  once daily.  Labs meet treatment parameters today and therefore, she is cleared for her next 5FU infusion pump.  She denies any nausea/vomiting, stomatitis, changes in bowel habits, diarrhea/loose stools.  She is doing well at this time.  She notes some insomnia and issues with sleeping.  She reports that Benadryl has the paradoxical effect of hyperactivity in her.  Therefore, I have prescribed low-dose Restoril 15 mg at HS.  Rx is printed for the patient today.  Return in 1 week for follow-up as scheduled, and next cycle of chemotherapy.   THERAPY PLAN:  Following Adjuvant therapy as outlined above (5FU CI + XRT), she will be considered for further surgical intervention due to positive margins at time of surgery.  All questions were answered. The patient knows to call the clinic with any problems, questions or concerns. We can certainly see the patient much sooner if necessary.  Patient and plan discussed with Dr. Ancil Linsey and she is in agreement  with the aforementioned.   This note is electronically signed by: Meghan Anderson 02/28/2016 5:35 PM

## 2016-02-28 ENCOUNTER — Encounter (HOSPITAL_COMMUNITY): Payer: Medicare Other

## 2016-02-28 ENCOUNTER — Encounter (HOSPITAL_BASED_OUTPATIENT_CLINIC_OR_DEPARTMENT_OTHER): Payer: Medicare Other | Admitting: Oncology

## 2016-02-28 ENCOUNTER — Encounter (HOSPITAL_COMMUNITY): Payer: Self-pay | Admitting: Oncology

## 2016-02-28 VITALS — BP 174/86 | HR 80 | Temp 97.8°F | Resp 18 | Wt 125.0 lb

## 2016-02-28 DIAGNOSIS — G4700 Insomnia, unspecified: Secondary | ICD-10-CM | POA: Diagnosis not present

## 2016-02-28 DIAGNOSIS — Z510 Encounter for antineoplastic radiation therapy: Secondary | ICD-10-CM | POA: Diagnosis not present

## 2016-02-28 DIAGNOSIS — Z5111 Encounter for antineoplastic chemotherapy: Secondary | ICD-10-CM | POA: Diagnosis present

## 2016-02-28 DIAGNOSIS — C20 Malignant neoplasm of rectum: Secondary | ICD-10-CM | POA: Diagnosis not present

## 2016-02-28 DIAGNOSIS — C19 Malignant neoplasm of rectosigmoid junction: Secondary | ICD-10-CM | POA: Diagnosis not present

## 2016-02-28 LAB — COMPREHENSIVE METABOLIC PANEL
ALT: 10 U/L — AB (ref 14–54)
AST: 13 U/L — AB (ref 15–41)
Albumin: 3.3 g/dL — ABNORMAL LOW (ref 3.5–5.0)
Alkaline Phosphatase: 55 U/L (ref 38–126)
Anion gap: 6 (ref 5–15)
BUN: 23 mg/dL — ABNORMAL HIGH (ref 6–20)
CHLORIDE: 109 mmol/L (ref 101–111)
CO2: 25 mmol/L (ref 22–32)
CREATININE: 1.34 mg/dL — AB (ref 0.44–1.00)
Calcium: 8.3 mg/dL — ABNORMAL LOW (ref 8.9–10.3)
GFR calc non Af Amer: 37 mL/min — ABNORMAL LOW (ref >60)
GFR, EST AFRICAN AMERICAN: 43 mL/min — AB (ref >60)
Glucose, Bld: 102 mg/dL — ABNORMAL HIGH (ref 65–99)
POTASSIUM: 3.8 mmol/L (ref 3.5–5.1)
SODIUM: 140 mmol/L (ref 135–145)
Total Bilirubin: 0.3 mg/dL (ref 0.3–1.2)
Total Protein: 6.5 g/dL (ref 6.5–8.1)

## 2016-02-28 LAB — CBC WITH DIFFERENTIAL/PLATELET
BASOS ABS: 0 10*3/uL (ref 0.0–0.1)
Basophils Relative: 0 %
EOS ABS: 0.1 10*3/uL (ref 0.0–0.7)
EOS PCT: 1 %
HCT: 31.3 % — ABNORMAL LOW (ref 36.0–46.0)
Hemoglobin: 10.6 g/dL — ABNORMAL LOW (ref 12.0–15.0)
Lymphocytes Relative: 10 %
Lymphs Abs: 0.5 10*3/uL — ABNORMAL LOW (ref 0.7–4.0)
MCH: 31.4 pg (ref 26.0–34.0)
MCHC: 33.9 g/dL (ref 30.0–36.0)
MCV: 92.6 fL (ref 78.0–100.0)
Monocytes Absolute: 0.4 10*3/uL (ref 0.1–1.0)
Monocytes Relative: 8 %
Neutro Abs: 4 10*3/uL (ref 1.7–7.7)
Neutrophils Relative %: 81 %
PLATELETS: 209 10*3/uL (ref 150–400)
RBC: 3.38 MIL/uL — AB (ref 3.87–5.11)
RDW: 15.4 % (ref 11.5–15.5)
WBC: 5 10*3/uL (ref 4.0–10.5)

## 2016-02-28 MED ORDER — TEMAZEPAM 15 MG PO CAPS: 15.0000 mg | ORAL_CAPSULE | Freq: Every evening | ORAL | Status: DC | PRN

## 2016-02-28 MED ORDER — SODIUM CHLORIDE 0.9 % IV SOLN
225.0000 mg/m2/d | INTRAVENOUS | Status: DC
  Administered 2016-02-28: 2500 mg via INTRAVENOUS
  Filled 2016-02-28: qty 50

## 2016-02-28 NOTE — Progress Notes (Signed)
PICC line flushed and dressing changed.  Patient tolerated well.  5FU pump refilled after lab review and appointment with PA.

## 2016-02-28 NOTE — Patient Instructions (Signed)
[facility] Discharge Instructions for Patients Receiving Chemotherapy   Beginning January 23rd 2017 lab work for the [facility] will be done in the  Main lab at [facility] on 1st floor. If you have a lab appointment with the [facility] please come in thru the  Main Entrance and check in at the main information desk   Today you received the following chemotherapy agent: flourouracil.     If you develop nausea and vomiting, or diarrhea that is not controlled by your medication, call the clinic.  The clinic phone number is [phone]. Office hours are Monday-Friday 8:30am-5:00pm.  BELOW ARE SYMPTOMS THAT SHOULD BE REPORTED IMMEDIATELY:  *FEVER GREATER THAN 101.0 F  *CHILLS WITH OR WITHOUT FEVER  NAUSEA AND VOMITING THAT IS NOT CONTROLLED WITH YOUR NAUSEA MEDICATION  *UNUSUAL SHORTNESS OF BREATH  *UNUSUAL BRUISING OR BLEEDING  TENDERNESS IN MOUTH AND THROAT WITH OR WITHOUT PRESENCE OF ULCERS  *URINARY PROBLEMS  *BOWEL PROBLEMS  UNUSUAL RASH Items with * indicate a potential emergency and should be followed up as soon as possible. If you have an emergency after office hours please contact your primary care physician or go to the nearest emergency department.  Please call the clinic during office hours if you have any questions or concerns.   You may also contact the Patient Navigator at [phone] should you have any questions or need assistance in obtaining follow up care.      Resources For Cancer Patients and their Caregivers ? American Cancer Society: Can assist with transportation, wigs, general needs, runs Look Good Feel Better.        713-192-5259 ? Cancer Care: Provides financial assistance, online support groups, medication/co-pay assistance.  1-800-813-HOPE (612) 258-6955) ? [place] Assists [place] cancer patients and their families through emotional , educational and financial support.   716-773-8933 ? [place] DSS Where to apply for food stamps, Medicaid and utility assistance. 503 003 5813 ? [employer]: Transportation to medical appointments. (973)241-0598 ? Social Security Administration: May apply for disability if have a Stage IV cancer. [phone] 2268622943 ? [employer], Disability and Transit Services: Assists with nutrition, care and transit needs. [phone]

## 2016-02-28 NOTE — Patient Instructions (Signed)
Meghan Anderson at [facility] Discharge Instructions  RECOMMENDATIONS MADE BY THE CONSULTANT AND ANY TEST RESULTS WILL BE SENT TO YOUR REFERRING PHYSICIAN.   Exam and discussion by Dr Whitney Muse today 5FU today Temazepam given  Return to see the doctor in 1 week Please call the clinic if you have any questions or concerns    Thank you for choosing [place] at [facility] to provide your oncology and hematology care.  To afford each patient quality time with our provider, please arrive at least 15 minutes before your scheduled appointment time.   Beginning January 23rd 2017 lab work for the [facility] will be done in the  Main lab at [facility] on 1st floor. If you have a lab appointment with the [facility] please come in thru the  Main Entrance and check in at the main information desk  You need to re-schedule your appointment should you arrive 10 or more minutes late.  We strive to give you quality time with our providers, and arriving late affects you and other patients whose appointments are after yours.  Also, if you no show three or more times for appointments you may be dismissed from the clinic at the providers discretion.     Again, thank you for choosing [facility].  Our hope is that these requests will decrease the amount of time that you wait before being seen by our physicians.       _____________________________________________________________  Should you have questions after your visit to [facility], please contact our office at [phone] between the hours of 8:30 a.m. and 4:30 p.m.  Voicemails left after 4:30 p.m. will not be returned until the following business day.  For prescription refill requests, have your pharmacy contact our office.         Resources For Cancer Patients and their Caregivers ? American Cancer Society: Can assist with transportation, wigs, general needs, runs Look  Good Feel Better.        534-533-6013 ? Cancer Care: Provides financial assistance, online support groups, medication/co-pay assistance.  1-800-813-HOPE 720-106-1684) ? [place] Assists [place] cancer patients and their families through emotional , educational and financial support.  201-554-7698 ? [place] DSS Where to apply for food stamps, Medicaid and utility assistance. 636-010-4648 ? [employer]: Transportation to medical appointments. ([phone] 606-8378 ? Social Security Administration: May apply for disability if have a Stage IV cancer. 302-193-2408 [phone] ? [employer], Disability and Transit Services: Assists with nutrition, care and transit needs. [phone]

## 2016-02-29 DIAGNOSIS — Z510 Encounter for antineoplastic radiation therapy: Secondary | ICD-10-CM | POA: Diagnosis not present

## 2016-02-29 DIAGNOSIS — C19 Malignant neoplasm of rectosigmoid junction: Secondary | ICD-10-CM | POA: Diagnosis not present

## 2016-03-01 DIAGNOSIS — Z510 Encounter for antineoplastic radiation therapy: Secondary | ICD-10-CM | POA: Diagnosis not present

## 2016-03-01 DIAGNOSIS — C19 Malignant neoplasm of rectosigmoid junction: Secondary | ICD-10-CM | POA: Diagnosis not present

## 2016-03-04 DIAGNOSIS — Z510 Encounter for antineoplastic radiation therapy: Secondary | ICD-10-CM | POA: Diagnosis not present

## 2016-03-04 DIAGNOSIS — C19 Malignant neoplasm of rectosigmoid junction: Secondary | ICD-10-CM | POA: Diagnosis not present

## 2016-03-05 DIAGNOSIS — C19 Malignant neoplasm of rectosigmoid junction: Secondary | ICD-10-CM | POA: Diagnosis not present

## 2016-03-05 DIAGNOSIS — Z510 Encounter for antineoplastic radiation therapy: Secondary | ICD-10-CM | POA: Diagnosis not present

## 2016-03-06 ENCOUNTER — Encounter (HOSPITAL_COMMUNITY): Payer: Medicare Other

## 2016-03-06 ENCOUNTER — Encounter (HOSPITAL_BASED_OUTPATIENT_CLINIC_OR_DEPARTMENT_OTHER): Payer: Medicare Other | Admitting: Hematology & Oncology

## 2016-03-06 ENCOUNTER — Encounter (HOSPITAL_COMMUNITY): Payer: Self-pay | Admitting: Hematology & Oncology

## 2016-03-06 VITALS — BP 145/74 | HR 87 | Temp 97.8°F | Resp 16 | Wt 121.4 lb

## 2016-03-06 DIAGNOSIS — C20 Malignant neoplasm of rectum: Secondary | ICD-10-CM

## 2016-03-06 DIAGNOSIS — N183 Chronic kidney disease, stage 3 unspecified: Secondary | ICD-10-CM

## 2016-03-06 DIAGNOSIS — K521 Toxic gastroenteritis and colitis: Secondary | ICD-10-CM | POA: Diagnosis not present

## 2016-03-06 DIAGNOSIS — Z452 Encounter for adjustment and management of vascular access device: Secondary | ICD-10-CM

## 2016-03-06 DIAGNOSIS — D649 Anemia, unspecified: Secondary | ICD-10-CM

## 2016-03-06 DIAGNOSIS — Z510 Encounter for antineoplastic radiation therapy: Secondary | ICD-10-CM | POA: Diagnosis not present

## 2016-03-06 DIAGNOSIS — T451X5A Adverse effect of antineoplastic and immunosuppressive drugs, initial encounter: Secondary | ICD-10-CM

## 2016-03-06 DIAGNOSIS — C19 Malignant neoplasm of rectosigmoid junction: Secondary | ICD-10-CM | POA: Diagnosis not present

## 2016-03-06 LAB — CBC WITH DIFFERENTIAL/PLATELET
BASOS ABS: 0 10*3/uL (ref 0.0–0.1)
BASOS PCT: 0 %
EOS PCT: 3 %
Eosinophils Absolute: 0.2 10*3/uL (ref 0.0–0.7)
HCT: 31 % — ABNORMAL LOW (ref 36.0–46.0)
Hemoglobin: 10.2 g/dL — ABNORMAL LOW (ref 12.0–15.0)
LYMPHS PCT: 6 %
Lymphs Abs: 0.4 10*3/uL — ABNORMAL LOW (ref 0.7–4.0)
MCH: 30.4 pg (ref 26.0–34.0)
MCHC: 32.9 g/dL (ref 30.0–36.0)
MCV: 92.5 fL (ref 78.0–100.0)
MONO ABS: 0.4 10*3/uL (ref 0.1–1.0)
Monocytes Relative: 8 %
NEUTROS ABS: 4.5 10*3/uL (ref 1.7–7.7)
NEUTROS PCT: 83 %
PLATELETS: 135 10*3/uL — AB (ref 150–400)
RBC: 3.35 MIL/uL — ABNORMAL LOW (ref 3.87–5.11)
RDW: 15.9 % — AB (ref 11.5–15.5)
WBC: 5.5 10*3/uL (ref 4.0–10.5)

## 2016-03-06 LAB — COMPREHENSIVE METABOLIC PANEL
ALT: 10 U/L — ABNORMAL LOW (ref 14–54)
ANION GAP: 10 (ref 5–15)
AST: 14 U/L — AB (ref 15–41)
Albumin: 3.1 g/dL — ABNORMAL LOW (ref 3.5–5.0)
Alkaline Phosphatase: 56 U/L (ref 38–126)
BUN: 20 mg/dL (ref 6–20)
CHLORIDE: 107 mmol/L (ref 101–111)
CO2: 24 mmol/L (ref 22–32)
Calcium: 8.6 mg/dL — ABNORMAL LOW (ref 8.9–10.3)
Creatinine, Ser: 1.32 mg/dL — ABNORMAL HIGH (ref 0.44–1.00)
GFR calc Af Amer: 44 mL/min — ABNORMAL LOW (ref >60)
GFR, EST NON AFRICAN AMERICAN: 38 mL/min — AB (ref >60)
Glucose, Bld: 165 mg/dL — ABNORMAL HIGH (ref 65–99)
POTASSIUM: 3.4 mmol/L — AB (ref 3.5–5.1)
Sodium: 141 mmol/L (ref 135–145)
Total Bilirubin: 0.3 mg/dL (ref 0.3–1.2)
Total Protein: 6.4 g/dL — ABNORMAL LOW (ref 6.5–8.1)

## 2016-03-06 MED ORDER — SODIUM CHLORIDE 0.9% FLUSH
10.0000 mL | Freq: Once | INTRAVENOUS | Status: AC
  Administered 2016-03-06: 10 mL via INTRAVENOUS

## 2016-03-06 MED ORDER — HEPARIN SOD (PORK) LOCK FLUSH 100 UNIT/ML IV SOLN
250.0000 [IU] | Freq: Once | INTRAVENOUS | Status: AC
  Administered 2016-03-06: 250 [IU] via INTRAVENOUS

## 2016-03-06 MED ORDER — SODIUM CHLORIDE 0.9 % IV SOLN
Freq: Once | INTRAVENOUS | Status: AC
  Administered 2016-03-06: 12:00:00 via INTRAVENOUS

## 2016-03-06 MED ORDER — HEPARIN SOD (PORK) LOCK FLUSH 100 UNIT/ML IV SOLN
INTRAVENOUS | Status: AC
  Filled 2016-03-06: qty 5

## 2016-03-06 NOTE — Patient Instructions (Signed)
[place] at [facility] Discharge Instructions  RECOMMENDATIONS MADE BY THE CONSULTANT AND ANY TEST RESULTS WILL BE SENT TO YOUR REFERRING PHYSICIAN.   Exam and discussion by Dr Whitney Muse today Fluids today Hold treatment today Return Friday for possible treatment on Friday  Return to see the doctor in next wednesday Please call the clinic if you have any questions or concerns      Thank you for choosing [place] at [facility] to provide your oncology and hematology care.  To afford each patient quality time with our provider, please arrive at least 15 minutes before your scheduled appointment time.   Beginning January 23rd 2017 lab work for the [facility] will be done in the  Main lab at [facility] on 1st floor. If you have a lab appointment with the [facility] please come in thru the  Main Entrance and check in at the main information desk  You need to re-schedule your appointment should you arrive 10 or more minutes late.  We strive to give you quality time with our providers, and arriving late affects you and other patients whose appointments are after yours.  Also, if you no show three or more times for appointments you may be dismissed from the clinic at the providers discretion.     Again, thank you for choosing [facility].  Our hope is that these requests will decrease the amount of time that you wait before being seen by our physicians.       _____________________________________________________________  Should you have questions after your visit to [facility], please contact our office at [phone] between the hours of 8:30 a.m. and 4:30 p.m.  Voicemails left after 4:30 p.m. will not be returned until the following business day.  For prescription refill requests, have your pharmacy contact our office.         Resources For Cancer Patients and their Caregivers ? American Cancer  Society: Can assist with transportation, wigs, general needs, runs Look Good Feel Better.        312-315-7561 ? Cancer Care: Provides financial assistance, online support groups, medication/co-pay assistance.  1-800-813-HOPE 531-445-3905) ? [place] Assists [place] cancer patients and their families through emotional , educational and financial support.  236-787-2099 ? [place] DSS Where to apply for food stamps, Medicaid and utility assistance. 857-824-5547 ? [employer]: Transportation to medical appointments. (204) 348-4467 ? Social Security Administration: May apply for disability if have a Stage IV cancer. [phone])[phone] ? [employer], Disability and Transit Services: Assists with nutrition, care and transit needs. [phone]

## 2016-03-06 NOTE — Progress Notes (Signed)
[place] [facility] [employer] Note  Patient Care Team: [name], MD as PCP - General [name], MD as Consulting Physician (Ophthalmology) [name], MD as Consulting Physician (Gastroenterology)  CHIEF COMPLAINTS/PURPOSE OF CONSULTATION:  Low rectal mass with high grade glandular dysplasia Severe constipation, unable to pass stool without stool softener Colonoscopy on 11/01/15 with Dr. [name] revealing large fungating rectal mass, precluded colonoscopy Weight loss CT Abdomen/Pelvis 11/01/15 with irregular annular thickening in mid rectum with rectal intussusception, 4 hypodense subcentimeter liver lesions too small to characterize, chronic infectious or inflammatory bronchiolitis at the lung bases COPD Transanal minimally invasive surgery with excision of rectal polyp with Dr. [name] on 05/14/9448 Final pathology MMR IHC normal, moderately differentiated rectal adenocarcinoma 4.3 cm, tumor invades through muscularis propria into pericolorectal tissue, margins of resection positive T3,NX,MX    Rectal adenocarcinoma ([place])   11/01/2015 Procedure Colonoscopy by Dr. [name]   11/01/2015 Pathology Results Rectum, biopsy, mass - FRAGMENTS OF TUBULAR ADENOMA WITH HIGH GRADE GLANDULAR DYSPLASIA.   11/07/2015 Imaging CT abd/pelvis- Irregular annular wall thickening in the mid rectum with associated rectal intussusception, presumably representing a primary rectal neoplasm. Borderline prominent upper presacral lymph node, possibly metastatic.   12/14/2015 Definitive Surgery Rectum, resection, rectal mass by Dr. [name]   04/18/5915 Pathology Results Rectum, resection, rectal mass MODERATELY DIFFERENTIATED RECTAL ADENOCARCINOMA (4.3 CM) THE TUMOR INVADES THROUGH THE MUSCULARIS PROPRIA INTO PERICOLORECTAL TISSUE MARGINS OF RESECTION IS POSITIVE   12/14/2015 Pathology Results There is a very low probability that microsatellite instability (MSI) is present.   01/15/2016 Procedure Colonoscopy by Dr. [name]   01/15/2016 Pathology Results 1. Colon, polyp(s), descending - TUBULAR ADENOMA. NO HIGH GRADE DYSPLASIA OR MALIGNANCY IDENTIFIED. 2. Colon, polyp(s), splenic flexure - TUBULAR ADENOMA. NO HIGH GRADE DYSPLASIA OR MALIGNANCY IDENTIFIED. 3. Colon, polyp(s), ascending - TUBULAR ADENO   01/24/2016 Imaging CT abd/pelvis- Nonspecific mild residual rectal wall thickening with associated mild perirectal fat stranding posteriorly in the mid to upper rectum (8-9 cm superior to the anal verge), which could represent postsurgical changes and/or residual tumor.    02/20/2016 -  Chemotherapy 5FU CI with XRT   02/20/2016 -  Radiation Therapy Dr. Pablo Ledger     HISTORY OF PRESENTING ILLNESS:  Meghan Anderson 80 y.o. female is here for follow-up of her rectal carcinoma.   Meghan Anderson is accompanied by her son and in the treatment chair during our visit. She is receiving radiation treatment with her last scheduled treatment on May 22nd.   She is doing all right, though a little bit sore.   Reports diarrhea, her son states it has been getting worse. When asked if it is a lot in large volume, she states she hasn't been eating a lot and changes the color of the water in the toilet. Reports she was doing pretty well until last week around Friday. She gets up to use the bathroom throughout the night about 3 times total. She took 2 imodium this morning, 1 yesterday, and 1 the day before. She ate hash browns this morning and "they have stayed down".  She has one to two mouth sores but has always dealt with some mouth sores with her dentures.   Reports abdominal pain on one side.   Reports one episode of vomiting this morning and called EMS. She is not sure what caused the vomiting and notes she did not vomit food but "yucky stuff". She called EMS because she could not breath and had  difficulty getting to her nebulizer. Denies chest pain. She felt better when EMS put her on oxygen.  She is not sure if this difficulty breathing was associated with anxiety or not. While discussing this potential anxiety, she begins to cry. She notes she just does not understand why she has cancer.  Denies heartburn.   MEDICAL HISTORY:  Past Medical History  Diagnosis Date  . Hypertension   . Hyperlipidemia   . Chronic bronchitis ([place])   . Thyroid disease   . GERD (gastroesophageal reflux disease)   . Arthritis   . Murmur, heart   . Cataract   . Hypothyroidism   . Complication of anesthesia   . PONV (postoperative nausea and vomiting)   . COPD (chronic obstructive pulmonary disease) ([place])   . Rectal adenocarcinoma ([place])   . Chronic renal disease, stage 3, moderately decreased glomerular filtration rate (GFR) between 30-59 mL/min/1.73 square meter 02/21/2016    SURGICAL HISTORY: Past Surgical History  Procedure Laterality Date  . Left ear    . Abdominal hysterectomy      complete  . Colonoscopy N/A 11/01/2015    RMR: Large fungating rectal mass precluded colonoscopy. Status post biopsy.   . Transanal endoscopic microsurgery N/A 12/14/2015    Procedure: TRANSANAL ENDOSCOPIC MICROSURGERY OF RECTAL POLYP;  Surgeon: [name], MD;  Location: [facility];  Service: General;  Laterality: N/A;  . Colonoscopy N/A 01/15/2016    Procedure: COLONOSCOPY;  Surgeon: [name], MD;  Location: [facility];  Service: Endoscopy;  Laterality: N/A;  130     SOCIAL HISTORY: Social History   Social History  . Marital Status: Widowed    Spouse Name: N/A  . Number of Children: N/A  . Years of Education: N/A   Occupational History  . Not on file.   Social History Main Topics  . Smoking status: Former Smoker    Quit date: 11/11/2010  . Smokeless tobacco: Never Used  . Alcohol Use: No  . Drug Use: No  . Sexual Activity: Not on file   Other Topics Concern  . Not on file   Social History Narrative   Widowed for 38 years. 2 sons and 2 daughters. 9 grandchildren 52  great-grandchildren Ex smoker, quit about 2 years ago.  She enjoys painting and riding her golf cart [place] the farm.  She is from this area.   FAMILY HISTORY: Family History  Problem Relation Age of Onset  . Kidney disease Mother     nephrectomy x 1  . COPD Brother   . Emphysema Brother   . Asthma Brother   . Cancer Brother 68    brain  . Colon cancer Neg Hx    indicated that her mother is deceased. She indicated that her father is deceased. She indicated that her sister is alive. She indicated that three of her six brothers are alive.   Mother deceased at 82 yo, congestive heart failure. Father deceased at 21 yo, lung troubles, gall stones, and congestive heart failure.  6 siblings. Only two living. Her baby brother and her twin brother are still living. One brother died of a brain tumor in his 64s. Lived with brain tumor for about 10 years. One brother had lung problems and died of a heart attack in his 40s. One brother had stomach cancer and died in his early 61s. One sister died at 47 yo of congestive heart failure.  ALLERGIES:  is allergic to moxifloxacin hcl in nacl; levaquin; prednisone; and quinolones.  MEDICATIONS:  Current Outpatient Prescriptions  Medication Sig Dispense Refill  . albuterol (PROVENTIL) (2.5 MG/3ML) 0.083% nebulizer solution NEBULIZE 1 VIAL EVERY 4 HOURS AS NEEDED (Patient taking differently: NEBULIZE 1 VIAL EVERY 4 HOURS AS NEEDED FOR SHORTNESS OF BREATHE.) 150 mL 2  . amLODipine (NORVASC) 5 MG tablet Take 1 tablet (5 mg total) by mouth daily. 30 tablet 5  . atorvastatin (LIPITOR) 80 MG tablet Take 1 tablet (80 mg total) by mouth daily. (Patient taking differently: Take 40 mg by mouth daily at 12 noon. ) 90 tablet 3  . dextrose 5 % SOLN 1,000 mL with fluorouracil 5 GM/100ML SOLN Inject into the vein. Daily throughout radiation. Starting 02/21/16    . fluticasone (FLONASE) 50 MCG/ACT nasal spray Place 2 sprays into both nostrils daily as needed. (Patient  taking differently: Place 2 sprays into both nostrils daily as needed for allergies or rhinitis. ) 16 g 1  . Fluticasone Furoate-Vilanterol (BREO ELLIPTA) 100-25 MCG/INH AEPB USE 1 PUFF ONCE DAILY    . HYDROcodone-acetaminophen (NORCO/VICODIN) 5-325 MG tablet Take 1-2 tablets by mouth every 4 (four) hours as needed for moderate pain. 30 tablet 0  . levothyroxine (SYNTHROID, LEVOTHROID) 150 MCG tablet Take 1 tablet (150 mcg total) by mouth daily. 90 tablet 3  . lisinopril-hydrochlorothiazide (PRINZIDE,ZESTORETIC) 20-12.5 MG tablet TAKE TWO TABLETS BY MOUTH DAILY 60 tablet 3  . omeprazole (PRILOSEC) 40 MG capsule TAKE ONE (1) CAPSULE EACH DAY 30 capsule 2  . ondansetron (ZOFRAN) 8 MG tablet Take 1 tablet (8 mg total) by mouth every 8 (eight) hours as needed for nausea or vomiting. 30 tablet 2  . polyethylene glycol powder (GLYCOLAX/MIRALAX) powder Take 17 g by mouth 2 (two) times daily as needed. 3350 g 1  . potassium chloride SA (K-DUR,KLOR-CON) 20 MEQ tablet TAKE ONE (1) TABLET EACH DAY 30 tablet 3  . temazepam (RESTORIL) 15 MG capsule Take 1-2 capsules (15-30 mg total) by mouth at bedtime as needed for sleep. 45 capsule 1  . tiotropium (SPIRIVA HANDIHALER) 18 MCG inhalation capsule Place 1 capsule (18 mcg total) into inhaler and inhale daily. 90 capsule 3   No current facility-administered medications for this visit.    Review of Systems  Constitutional: Negative for fever, chills, weight loss and malaise/fatigue.  HENT: Negative.  Negative for congestion, hearing loss, nosebleeds, sore throat and tinnitus.   Eyes: Negative.  Negative for blurred vision, double vision, pain and discharge.  Respiratory: Positive for shortness of breath. Negative for cough, hemoptysis, sputum production and wheezing.        Difficulty breathing which necessitated call for EMS this morning.  Cardiovascular: Negative.  Negative for chest pain, palpitations, claudication, leg swelling and PND.  Gastrointestinal:  Positive for vomiting and diarrhea. Negative for heartburn, nausea, abdominal pain, blood in stool and melena.       Taking imodium. Wakes up to move bowels 3x nightly. One episode of vomiting this morning.  Genitourinary: Negative.  Negative for dysuria, urgency, frequency and hematuria.  Musculoskeletal: Negative.  Negative for myalgias, joint pain and falls.  Skin: Negative.  Negative for itching and rash.  Neurological: Negative.  Negative for dizziness, tingling, tremors, sensory change, speech change, focal weakness, seizures, loss of consciousness, weakness and headaches.  Endo/Heme/Allergies: Negative.  Does not bruise/bleed easily.  Psychiatric/Behavioral: Negative.  Negative for depression, suicidal ideas, memory loss and substance abuse. The patient is not nervous/anxious and does not have insomnia.   All other systems reviewed and are negative.  14 point ROS  was done and is otherwise as detailed above or in HPI   PHYSICAL EXAMINATION: ECOG PERFORMANCE STATUS: 1 - Symptomatic but completely ambulatory  Vitals with BMI 03/06/2016  Height   Weight 121 lbs 6 oz  BMI   Systolic 573  Diastolic 63  Pulse 93  Respirations 20    Physical Exam  Constitutional: She is oriented to person, place, and time and well-developed, well-nourished, and in no distress.  In treatment chair  HENT:  Head: Normocephalic and atraumatic.  Nose: Nose normal.  Mouth/Throat: Oropharynx is clear and moist. No oropharyngeal exudate.  Eyes: Conjunctivae and EOM are normal. Pupils are equal, round, and reactive to light. Right eye exhibits no discharge. Left eye exhibits no discharge. No scleral icterus.  Neck: Normal range of motion. Neck supple. No JVD present. No tracheal deviation present. No thyromegaly present.  Cardiovascular: Normal rate, regular rhythm and normal heart sounds.  Exam reveals no gallop and no friction rub.   No murmur heard. Pulmonary/Chest: Effort normal and breath sounds  normal. She has no wheezes. She has no rales.  Abdominal: Soft. Bowel sounds are normal. She exhibits no distension and no mass. There is no tenderness. There is no rebound and no guarding.  Musculoskeletal: Normal range of motion. She exhibits no edema.  Lymphadenopathy:    She has no cervical adenopathy.  Neurological: She is alert and oriented to person, place, and time. She has normal reflexes. No cranial nerve deficit. Gait normal. Coordination normal.  Skin: Skin is warm and dry. No rash noted.  Psychiatric: Mood, memory, affect and judgment normal.  Nursing note and vitals reviewed.   PATHOLOGY:            LABORATORY DATA:  I have reviewed the data as listed Lab Results  Component Value Date   WBC 5.5 03/06/2016   HGB 10.2* 03/06/2016   HCT 31.0* 03/06/2016   MCV 92.5 03/06/2016   PLT 135* 03/06/2016   CMP     Component Value Date/Time   NA 141 03/06/2016 1030   NA 140 11/07/2015 1255   K 3.4* 03/06/2016 1030   CL 107 03/06/2016 1030   CO2 24 03/06/2016 1030   GLUCOSE 165* 03/06/2016 1030   GLUCOSE 94 11/07/2015 1255   BUN 20 03/06/2016 1030   BUN 14 11/07/2015 1255   CREATININE 1.32* 03/06/2016 1030   CALCIUM 8.6* 03/06/2016 1030   PROT 6.4* 03/06/2016 1030   PROT 5.8* 12/29/2014 1155   ALBUMIN 3.1* 03/06/2016 1030   ALBUMIN 3.5 12/29/2014 1155   AST 14* 03/06/2016 1030   ALT 10* 03/06/2016 1030   ALKPHOS 56 03/06/2016 1030   BILITOT 0.3 03/06/2016 1030   BILITOT 0.2 12/29/2014 1155   GFRNONAA 38* 03/06/2016 1030   GFRAA 44* 03/06/2016 1030    RADIOGRAPHIC STUDIES: I have personally reviewed the radiological images as listed and agreed with the findings in the report. Study Result     CLINICAL DATA: 80 year old female with reported history of rectal mass on recent colonoscopy and weight loss. Patient reports chronic constipation and rectal bleeding. Gastroesophageal reflux disease. Prior hysterectomy.  EXAM: CT ABDOMEN AND PELVIS WITH  CONTRAST  TECHNIQUE: Multidetector CT imaging of the abdomen and pelvis was performed using the standard protocol following bolus administration of intravenous contrast.  CONTRAST: 57m OMNIPAQUE IOHEXOL 300 MG/ML SOLN  COMPARISON: None.  FINDINGS: Lower chest: There are patchy ground-glass opacities with associated mild cylindrical bronchiectasis in the visualized right middle lobe, lingula and right greater than left  lower lobes.  Hepatobiliary: There are four tiny hypodense liver lesions scattered throughout the liver, largest 0.5 cm in segment 5 of the right liver lobe (series 2/ image 21), too small to characterize. Otherwise normal liver. Normal gallbladder with no radiopaque cholelithiasis. No biliary ductal dilatation.  Pancreas: Normal, with no mass or duct dilation.  Spleen: Normal size. No mass.  Adrenals/Urinary Tract: Normal adrenals. Simple 1.5 cm renal cyst in the posterior upper right kidney. There are a few additional subcentimeter hypodense lesions in both kidneys, too small to characterize. No hydronephrosis. Normal bladder.  Stomach/Bowel: Grossly normal stomach. Normal caliber small bowel with no small bowel wall thickening. Appendix is not discretely visualized. Moderate sigmoid diverticulosis, with no large bowel wall thickening or pericolonic fat stranding. There is irregular annular wall thickening in the mid rectum with associated intussusception, best seen on the coronal series 4/image 63.  Vascular/Lymphatic: Atherosclerotic nonaneurysmal abdominal aorta. Patent portal, splenic, hepatic and renal veins. No pathologically enlarged lymph nodes in the abdomen or pelvis. There is a borderline prominent 0.7 cm upper presacral node in the midline (series 2/image 53).  Reproductive: Status post hysterectomy, with no abnormal findings at the vaginal cuff. No adnexal mass.  Other: No pneumoperitoneum, ascites or focal fluid  collection.  Musculoskeletal: No aggressive appearing focal osseous lesions. Asymmetric moderate osteoarthritis in the left hip joint. Marked degenerative changes in the visualized thoracolumbar spine.  IMPRESSION: 1. Irregular annular wall thickening in the mid rectum with associated rectal intussusception, presumably representing a primary rectal neoplasm. 2. Borderline prominent upper presacral lymph node, possibly metastatic. 3. Four hypodense subcentimeter liver lesions, too small to characterize. If the rectal mass proves to represent a rectal malignancy, a follow-up CT abdomen with IV contrast or MRI abdomen with and without IV contrast is recommended in 3 months. 4. Chronic infectious or inflammatory bronchiolitis at the lung bases, likely due to atypical mycobacterial infection (MAI) or recurrent aspiration. 5. Moderate sigmoid diverticulosis.   Electronically Signed  By: Ilona Sorrel M.D.  On: 11/07/2015 09:28    CLINICAL DATA: 80 year old female status post minimally invasive trans anal excision of rectal adenocarcinoma on 12/14/2015 with positive margins, presenting for treatment planning.  EXAM: CT PELVIS WITH CONTRAST    IMPRESSION: 1. Nonspecific mild residual rectal wall thickening with associated mild perirectal fat stranding posteriorly in the mid to upper rectum (8-9 cm superior to the anal verge), which could represent postsurgical changes and/or residual tumor. 2. No pelvic lymphadenopathy. No evidence of metastatic disease in the pelvis. 3. Moderate sigmoid diverticulosis.   Electronically Signed  By: Ilona Sorrel M.D.  On: 01/24/2016 09:10  ASSESSMENT & PLAN:  T3 rectal carcinoma Completion colonoscopy on 01/15/2016 with multiple colonic polyps, tubular adenoma Near obstructing lesion, s/p transanal resection with Dr. Marcello Moores Colonoscopy on 11/01/15 with Dr. [name] revealing large fungating rectal mass, precluded colonoscopy Weight  loss CT Abdomen/Pelvis 11/01/15 with irregular annular thickening in mid rectum with rectal intussusception, 4 hypodense subcentimeter liver lesions too small to characterize, chronic infectious or inflammatory bronchiolitis at the lung bases COPD Anemia CKD, stage 3 CT pelvis 01/24/2016 with no pelvic LAD, no evidence of metastatic disease in the pelvis Concurrent 5-FU/XRT Chemotherapy induced diarrhea  Although no specific dose adjustments are recommended for 5-FU in renal impairment, toxicity can become more of an issue. Given her symptoms today I have recommended holding additional 5-FU for now. We can reassess her on Friday and if not improved I would hold therapy for at least a week. She  can continue with XRT.   She will contact us if she develops severe diarrhea, vomiting, or mouthsores.   I emphasized the importance of communicating to [place] if she has any difficulties throughout her treatment to avoid unnecessary toxicity.   This document serves as a record of services personally performed by Ancil Linsey, MD. It was created on her behalf by Arlyce Harman, a trained medical scribe. The creation of this record is based on the scribe's personal observations and the provider's statements to them. This document has been checked and approved by the attending provider.  I have reviewed the above documentation for accuracy and completeness, and I agree with the above.  All questions were answered. The patient knows to call the clinic with any problems, questions or concerns.  This note was electronically signed.    Molli Hazard, MD   03/06/2016 11:57 AM

## 2016-03-06 NOTE — Progress Notes (Signed)
Tolerated IV fluids well. Ambulatory on discharge home with son.

## 2016-03-07 DIAGNOSIS — Z510 Encounter for antineoplastic radiation therapy: Secondary | ICD-10-CM | POA: Diagnosis not present

## 2016-03-07 DIAGNOSIS — C19 Malignant neoplasm of rectosigmoid junction: Secondary | ICD-10-CM | POA: Diagnosis not present

## 2016-03-08 ENCOUNTER — Encounter (HOSPITAL_BASED_OUTPATIENT_CLINIC_OR_DEPARTMENT_OTHER): Payer: Medicare Other

## 2016-03-08 VITALS — BP 180/96 | HR 74 | Temp 98.0°F | Resp 16 | Wt 123.8 lb

## 2016-03-08 DIAGNOSIS — Z5111 Encounter for antineoplastic chemotherapy: Secondary | ICD-10-CM

## 2016-03-08 DIAGNOSIS — C20 Malignant neoplasm of rectum: Secondary | ICD-10-CM | POA: Diagnosis not present

## 2016-03-08 MED ORDER — SODIUM CHLORIDE 0.9 % IV SOLN: Freq: Once | INTRAVENOUS | Status: DC

## 2016-03-08 MED ORDER — SODIUM CHLORIDE 0.9 % IV SOLN
180.0000 mg/m2/d | INTRAVENOUS | Status: DC
  Administered 2016-03-08: 2000 mg via INTRAVENOUS
  Filled 2016-03-08: qty 40

## 2016-03-08 MED ORDER — SODIUM CHLORIDE 0.9% FLUSH: 10.0000 mL | INTRAVENOUS | Status: DC | PRN

## 2016-03-08 NOTE — Progress Notes (Signed)
Patient reports she has not had any more diarrhea since Wednesday. Reports normal BM this am. Denies any nausea or vomiting since Wednesday. Denies any further abdominal pain stating she thinks it was just from diarrhea anyway. Discussed with Dr.Penland. Patient is ok to receive 5FU continuous infusion pump today.  Continuous infusion pump intact and infusing. Patient ambulatory on discharge home with grandson.

## 2016-03-08 NOTE — Patient Instructions (Signed)
[facility] Discharge Instructions for Patients Receiving Chemotherapy   Beginning January 23rd 2017 lab work for the [facility] will be done in the  Main lab at [facility] on 1st floor. If you have a lab appointment with the [facility] please come in thru the  Main Entrance and check in at the main information desk   Today you received the following chemotherapy agents 5FU continuous infusion pump.  To help prevent nausea and vomiting after your treatment, we encourage you to take your nausea medication as instructed.  If you develop nausea and vomiting, or diarrhea that is not controlled by your medication, call the clinic.  The clinic phone number is [phone]. Office hours are Monday-Friday 8:30am-5:00pm.  BELOW ARE SYMPTOMS THAT SHOULD BE REPORTED IMMEDIATELY:  *FEVER GREATER THAN 101.0 F  *CHILLS WITH OR WITHOUT FEVER  NAUSEA AND VOMITING THAT IS NOT CONTROLLED WITH YOUR NAUSEA MEDICATION  *UNUSUAL SHORTNESS OF BREATH  *UNUSUAL BRUISING OR BLEEDING  TENDERNESS IN MOUTH AND THROAT WITH OR WITHOUT PRESENCE OF ULCERS  *URINARY PROBLEMS  *BOWEL PROBLEMS  UNUSUAL RASH Items with * indicate a potential emergency and should be followed up as soon as possible. If you have an emergency after office hours please contact your primary care physician or go to the nearest emergency department.  Please call the clinic during office hours if you have any questions or concerns.   You may also contact the Patient Navigator at [phone] should you have any questions or need assistance in obtaining follow up care.  Resources For Cancer Patients and their Caregivers ? American Cancer Society: Can assist with transportation, wigs, general needs, runs Look Good Feel Better.        602 518 0254 ? Cancer Care: Provides financial assistance, online support groups, medication/co-pay assistance.  1-800-813-HOPE 417-288-7656) ? [place] Assists [place] cancer patients and their families through emotional , educational and financial support.  208-616-1385 ? [place] DSS Where to apply for food stamps, Medicaid and utility assistance. 415-608-9556 ? [facility]: Transportation to medical appointments. (352)714-0830 ? Social Security Administration: May apply for disability if have a Stage IV cancer. 984-341-8978 701 233 4655 ? [employer], Disability and Transit Services: Assists with nutrition, care and transit needs. 803-725-5111

## 2016-03-11 DIAGNOSIS — C20 Malignant neoplasm of rectum: Secondary | ICD-10-CM | POA: Diagnosis not present

## 2016-03-11 DIAGNOSIS — R300 Dysuria: Secondary | ICD-10-CM | POA: Diagnosis not present

## 2016-03-11 DIAGNOSIS — Z510 Encounter for antineoplastic radiation therapy: Secondary | ICD-10-CM | POA: Diagnosis not present

## 2016-03-12 DIAGNOSIS — Z510 Encounter for antineoplastic radiation therapy: Secondary | ICD-10-CM | POA: Diagnosis not present

## 2016-03-12 DIAGNOSIS — R300 Dysuria: Secondary | ICD-10-CM | POA: Diagnosis not present

## 2016-03-12 DIAGNOSIS — C20 Malignant neoplasm of rectum: Secondary | ICD-10-CM | POA: Diagnosis not present

## 2016-03-13 ENCOUNTER — Ambulatory Visit (HOSPITAL_COMMUNITY): Payer: Medicare Other | Admitting: Hematology & Oncology

## 2016-03-13 ENCOUNTER — Inpatient Hospital Stay (HOSPITAL_COMMUNITY): Payer: Medicare Other

## 2016-03-13 DIAGNOSIS — Z510 Encounter for antineoplastic radiation therapy: Secondary | ICD-10-CM | POA: Diagnosis not present

## 2016-03-13 DIAGNOSIS — C20 Malignant neoplasm of rectum: Secondary | ICD-10-CM | POA: Diagnosis not present

## 2016-03-13 DIAGNOSIS — R300 Dysuria: Secondary | ICD-10-CM | POA: Diagnosis not present

## 2016-03-14 DIAGNOSIS — Z510 Encounter for antineoplastic radiation therapy: Secondary | ICD-10-CM | POA: Diagnosis not present

## 2016-03-14 DIAGNOSIS — R300 Dysuria: Secondary | ICD-10-CM | POA: Diagnosis not present

## 2016-03-14 DIAGNOSIS — C20 Malignant neoplasm of rectum: Secondary | ICD-10-CM | POA: Diagnosis not present

## 2016-03-15 ENCOUNTER — Encounter (HOSPITAL_COMMUNITY): Payer: Medicare Other | Attending: Hematology & Oncology

## 2016-03-15 ENCOUNTER — Encounter (HOSPITAL_COMMUNITY): Payer: Self-pay

## 2016-03-15 VITALS — BP 164/77 | HR 78 | Temp 98.0°F | Resp 18 | Wt 127.0 lb

## 2016-03-15 DIAGNOSIS — Z5111 Encounter for antineoplastic chemotherapy: Secondary | ICD-10-CM

## 2016-03-15 DIAGNOSIS — C20 Malignant neoplasm of rectum: Secondary | ICD-10-CM | POA: Insufficient documentation

## 2016-03-15 DIAGNOSIS — R300 Dysuria: Secondary | ICD-10-CM | POA: Diagnosis not present

## 2016-03-15 DIAGNOSIS — C19 Malignant neoplasm of rectosigmoid junction: Secondary | ICD-10-CM | POA: Diagnosis not present

## 2016-03-15 DIAGNOSIS — Z510 Encounter for antineoplastic radiation therapy: Secondary | ICD-10-CM | POA: Diagnosis not present

## 2016-03-15 LAB — CBC WITH DIFFERENTIAL/PLATELET
Basophils Absolute: 0 10*3/uL (ref 0.0–0.1)
Basophils Relative: 0 %
EOS PCT: 6 %
Eosinophils Absolute: 0.2 10*3/uL (ref 0.0–0.7)
HCT: 30.5 % — ABNORMAL LOW (ref 36.0–46.0)
Hemoglobin: 10 g/dL — ABNORMAL LOW (ref 12.0–15.0)
LYMPHS ABS: 0.3 10*3/uL — AB (ref 0.7–4.0)
LYMPHS PCT: 8 %
MCH: 30.9 pg (ref 26.0–34.0)
MCHC: 32.8 g/dL (ref 30.0–36.0)
MCV: 94.1 fL (ref 78.0–100.0)
MONO ABS: 0.4 10*3/uL (ref 0.1–1.0)
Monocytes Relative: 9 %
Neutro Abs: 3.4 10*3/uL (ref 1.7–7.7)
Neutrophils Relative %: 77 %
PLATELETS: 180 10*3/uL (ref 150–400)
RBC: 3.24 MIL/uL — ABNORMAL LOW (ref 3.87–5.11)
RDW: 16.4 % — AB (ref 11.5–15.5)
WBC: 4.3 10*3/uL (ref 4.0–10.5)

## 2016-03-15 LAB — COMPREHENSIVE METABOLIC PANEL
ALBUMIN: 3.2 g/dL — AB (ref 3.5–5.0)
ALT: 10 U/L — ABNORMAL LOW (ref 14–54)
ANION GAP: 7 (ref 5–15)
AST: 14 U/L — ABNORMAL LOW (ref 15–41)
Alkaline Phosphatase: 45 U/L (ref 38–126)
BUN: 21 mg/dL — ABNORMAL HIGH (ref 6–20)
CALCIUM: 7.7 mg/dL — AB (ref 8.9–10.3)
CHLORIDE: 107 mmol/L (ref 101–111)
CO2: 27 mmol/L (ref 22–32)
Creatinine, Ser: 1.29 mg/dL — ABNORMAL HIGH (ref 0.44–1.00)
GFR calc non Af Amer: 39 mL/min — ABNORMAL LOW (ref >60)
GFR, EST AFRICAN AMERICAN: 45 mL/min — AB (ref >60)
GLUCOSE: 115 mg/dL — AB (ref 65–99)
POTASSIUM: 3.5 mmol/L (ref 3.5–5.1)
SODIUM: 141 mmol/L (ref 135–145)
Total Bilirubin: 0.4 mg/dL (ref 0.3–1.2)
Total Protein: 5.9 g/dL — ABNORMAL LOW (ref 6.5–8.1)

## 2016-03-15 MED ORDER — SODIUM CHLORIDE 0.9 % IV SOLN: Freq: Once | INTRAVENOUS | Status: DC

## 2016-03-15 MED ORDER — SODIUM CHLORIDE 0.9% FLUSH: 10.0000 mL | INTRAVENOUS | Status: DC | PRN

## 2016-03-15 MED ORDER — HEPARIN SOD (PORK) LOCK FLUSH 100 UNIT/ML IV SOLN: 250.0000 [IU] | Freq: Once | INTRAVENOUS | Status: DC | PRN

## 2016-03-15 MED ORDER — FLUOROURACIL CHEMO INJECTION 5 GM/100ML
180.0000 mg/m2/d | INTRAVENOUS | Status: DC
  Administered 2016-03-15: 2000 mg via INTRAVENOUS
  Filled 2016-03-15: qty 40

## 2016-03-15 NOTE — Patient Instructions (Signed)
[facility] Discharge Instructions for Patients Receiving Chemotherapy   Beginning January 23rd 2017 lab work for the [facility] will be done in the  Main lab at [facility] on 1st floor. If you have a lab appointment with the [facility] please come in thru the  Main Entrance and check in at the main information desk   Today you received the following chemotherapy agents 43fu Dressing change to rt picc line Please take calcium 600 mg daily. Your calcium is low at 7.7 Call us if you develop diarrhea  To help prevent nausea and vomiting after your treatment, we encourage you to take your nausea medication  If you develop nausea and vomiting, or diarrhea that is not controlled by your medication, call the clinic.  The clinic phone number is [phone]. Office hours are Monday-Friday 8:30am-5:00pm.  BELOW ARE SYMPTOMS THAT SHOULD BE REPORTED IMMEDIATELY:  *FEVER GREATER THAN 101.0 F  *CHILLS WITH OR WITHOUT FEVER  NAUSEA AND VOMITING THAT IS NOT CONTROLLED WITH YOUR NAUSEA MEDICATION  *UNUSUAL SHORTNESS OF BREATH  *UNUSUAL BRUISING OR BLEEDING  TENDERNESS IN MOUTH AND THROAT WITH OR WITHOUT PRESENCE OF ULCERS  *URINARY PROBLEMS  *BOWEL PROBLEMS  UNUSUAL RASH Items with * indicate a potential emergency and should be followed up as soon as possible. If you have an emergency after office hours please contact your primary care physician or go to the nearest emergency department.  Please call the clinic during office hours if you have any questions or concerns.   You may also contact the Patient Navigator at [phone] should you have any questions or need assistance in obtaining follow up care.      Resources For Cancer Patients and their Caregivers ? American Cancer Society: Can assist with transportation, wigs, general needs, runs Look Good Feel Better.        539-036-9016 ? Cancer Care: Provides financial assistance, online support groups,  medication/co-pay assistance.  1-800-813-HOPE 520-181-1970) ? [place] Assists [place] cancer patients and their families through emotional , educational and financial support.  3652787444 ? [place] DSS Where to apply for food stamps, Medicaid and utility assistance. (513) 569-7557 ? [facility]ATS: Transportation to medical appointments. 519 524 0480 ? Social Security Administration: May apply for disability if have a Stage IV cancer. (323)197-1854 (667)401-3555 ? [employer], Disability and Transit Services: Assists with nutrition, care and transit needs. [phone]

## 2016-03-15 NOTE — Progress Notes (Signed)
23fu continuous infusion reinitiated. Tolerated well

## 2016-03-18 DIAGNOSIS — R300 Dysuria: Secondary | ICD-10-CM | POA: Diagnosis not present

## 2016-03-18 DIAGNOSIS — Z510 Encounter for antineoplastic radiation therapy: Secondary | ICD-10-CM | POA: Diagnosis not present

## 2016-03-18 DIAGNOSIS — C20 Malignant neoplasm of rectum: Secondary | ICD-10-CM | POA: Diagnosis not present

## 2016-03-19 DIAGNOSIS — Z510 Encounter for antineoplastic radiation therapy: Secondary | ICD-10-CM | POA: Diagnosis not present

## 2016-03-19 DIAGNOSIS — C20 Malignant neoplasm of rectum: Secondary | ICD-10-CM | POA: Diagnosis not present

## 2016-03-19 DIAGNOSIS — R300 Dysuria: Secondary | ICD-10-CM | POA: Diagnosis not present

## 2016-03-20 ENCOUNTER — Ambulatory Visit (HOSPITAL_COMMUNITY): Payer: Medicare Other | Admitting: Hematology & Oncology

## 2016-03-20 ENCOUNTER — Inpatient Hospital Stay (HOSPITAL_COMMUNITY): Payer: Medicare Other

## 2016-03-20 DIAGNOSIS — R300 Dysuria: Secondary | ICD-10-CM | POA: Diagnosis not present

## 2016-03-20 DIAGNOSIS — Z510 Encounter for antineoplastic radiation therapy: Secondary | ICD-10-CM | POA: Diagnosis not present

## 2016-03-20 DIAGNOSIS — C20 Malignant neoplasm of rectum: Secondary | ICD-10-CM | POA: Diagnosis not present

## 2016-03-21 DIAGNOSIS — Z510 Encounter for antineoplastic radiation therapy: Secondary | ICD-10-CM | POA: Diagnosis not present

## 2016-03-21 DIAGNOSIS — C20 Malignant neoplasm of rectum: Secondary | ICD-10-CM | POA: Diagnosis not present

## 2016-03-21 DIAGNOSIS — R300 Dysuria: Secondary | ICD-10-CM | POA: Diagnosis not present

## 2016-03-22 ENCOUNTER — Encounter (HOSPITAL_COMMUNITY): Payer: Self-pay | Admitting: Hematology & Oncology

## 2016-03-22 ENCOUNTER — Encounter (HOSPITAL_BASED_OUTPATIENT_CLINIC_OR_DEPARTMENT_OTHER): Payer: Medicare Other | Admitting: Hematology & Oncology

## 2016-03-22 ENCOUNTER — Encounter (HOSPITAL_BASED_OUTPATIENT_CLINIC_OR_DEPARTMENT_OTHER): Payer: Medicare Other

## 2016-03-22 VITALS — BP 180/85 | HR 98 | Temp 98.0°F | Resp 18 | Wt 130.4 lb

## 2016-03-22 DIAGNOSIS — C20 Malignant neoplasm of rectum: Secondary | ICD-10-CM | POA: Diagnosis not present

## 2016-03-22 DIAGNOSIS — D649 Anemia, unspecified: Secondary | ICD-10-CM | POA: Diagnosis not present

## 2016-03-22 DIAGNOSIS — N183 Chronic kidney disease, stage 3 (moderate): Secondary | ICD-10-CM

## 2016-03-22 DIAGNOSIS — R197 Diarrhea, unspecified: Secondary | ICD-10-CM | POA: Diagnosis not present

## 2016-03-22 DIAGNOSIS — Z510 Encounter for antineoplastic radiation therapy: Secondary | ICD-10-CM | POA: Diagnosis not present

## 2016-03-22 DIAGNOSIS — R300 Dysuria: Secondary | ICD-10-CM | POA: Diagnosis not present

## 2016-03-22 DIAGNOSIS — Z5111 Encounter for antineoplastic chemotherapy: Secondary | ICD-10-CM | POA: Diagnosis not present

## 2016-03-22 DIAGNOSIS — C19 Malignant neoplasm of rectosigmoid junction: Secondary | ICD-10-CM | POA: Diagnosis not present

## 2016-03-22 LAB — CBC WITH DIFFERENTIAL/PLATELET
BASOS ABS: 0 10*3/uL (ref 0.0–0.1)
Basophils Relative: 0 %
EOS PCT: 9 %
Eosinophils Absolute: 0.5 10*3/uL (ref 0.0–0.7)
HEMATOCRIT: 30.9 % — AB (ref 36.0–46.0)
Hemoglobin: 10.1 g/dL — ABNORMAL LOW (ref 12.0–15.0)
LYMPHS ABS: 0.3 10*3/uL — AB (ref 0.7–4.0)
LYMPHS PCT: 5 %
MCH: 31 pg (ref 26.0–34.0)
MCHC: 32.7 g/dL (ref 30.0–36.0)
MCV: 94.8 fL (ref 78.0–100.0)
MONO ABS: 0.3 10*3/uL (ref 0.1–1.0)
MONOS PCT: 7 %
NEUTROS ABS: 4 10*3/uL (ref 1.7–7.7)
Neutrophils Relative %: 79 %
PLATELETS: 156 10*3/uL (ref 150–400)
RBC: 3.26 MIL/uL — ABNORMAL LOW (ref 3.87–5.11)
RDW: 16.7 % — AB (ref 11.5–15.5)
WBC: 5 10*3/uL (ref 4.0–10.5)

## 2016-03-22 LAB — COMPREHENSIVE METABOLIC PANEL
ALT: 11 U/L — ABNORMAL LOW (ref 14–54)
AST: 18 U/L (ref 15–41)
Albumin: 3.3 g/dL — ABNORMAL LOW (ref 3.5–5.0)
Alkaline Phosphatase: 43 U/L (ref 38–126)
Anion gap: 10 (ref 5–15)
BILIRUBIN TOTAL: 0.3 mg/dL (ref 0.3–1.2)
BUN: 17 mg/dL (ref 6–20)
CALCIUM: 7.8 mg/dL — AB (ref 8.9–10.3)
CHLORIDE: 105 mmol/L (ref 101–111)
CO2: 22 mmol/L (ref 22–32)
Creatinine, Ser: 1.34 mg/dL — ABNORMAL HIGH (ref 0.44–1.00)
GFR calc Af Amer: 43 mL/min — ABNORMAL LOW (ref >60)
GFR, EST NON AFRICAN AMERICAN: 37 mL/min — AB (ref >60)
Glucose, Bld: 130 mg/dL — ABNORMAL HIGH (ref 65–99)
POTASSIUM: 3.3 mmol/L — AB (ref 3.5–5.1)
Sodium: 137 mmol/L (ref 135–145)
TOTAL PROTEIN: 6.3 g/dL — AB (ref 6.5–8.1)

## 2016-03-22 MED ORDER — SODIUM CHLORIDE 0.9 % IV SOLN
Freq: Once | INTRAVENOUS | Status: DC
Start: 2016-03-22 — End: 2016-03-22

## 2016-03-22 MED ORDER — HEPARIN SOD (PORK) LOCK FLUSH 100 UNIT/ML IV SOLN: 500.0000 [IU] | Freq: Once | INTRAVENOUS | Status: DC | PRN

## 2016-03-22 MED ORDER — SODIUM CHLORIDE 0.9% FLUSH
10.0000 mL | INTRAVENOUS | Status: DC | PRN
  Administered 2016-03-22: 10 mL via INTRAVENOUS
  Filled 2016-03-22: qty 10

## 2016-03-22 MED ORDER — HEPARIN SOD (PORK) LOCK FLUSH 100 UNIT/ML IV SOLN
500.0000 [IU] | Freq: Once | INTRAVENOUS | Status: AC
  Administered 2016-03-22: 500 [IU] via INTRAVENOUS

## 2016-03-22 MED ORDER — SODIUM CHLORIDE 0.9% FLUSH: 10.0000 mL | INTRAVENOUS | Status: DC | PRN

## 2016-03-22 MED ORDER — HEPARIN SOD (PORK) LOCK FLUSH 100 UNIT/ML IV SOLN
INTRAVENOUS | Status: AC
  Filled 2016-03-22: qty 5

## 2016-03-22 MED ORDER — SODIUM CHLORIDE 0.9 % IV SOLN
180.0000 mg/m2/d | INTRAVENOUS | Status: DC
  Administered 2016-03-22: 2000 mg via INTRAVENOUS
  Filled 2016-03-22: qty 40

## 2016-03-22 NOTE — Patient Instructions (Signed)
[place] at [facility] Discharge Instructions  RECOMMENDATIONS MADE BY THE CONSULTANT AND ANY TEST RESULTS WILL BE SENT TO YOUR REFERRING PHYSICIAN.  Return next week for labs and treatment and then the pump will run thru the next Tuesday when you finish radiation  Thank you for choosing [place] at [facility] to provide your oncology and hematology care.  To afford each patient quality time with our provider, please arrive at least 15 minutes before your scheduled appointment time.   Beginning January 23rd 2017 lab work for the [facility] will be done in the  Main lab at [facility] on 1st floor. If you have a lab appointment with the [facility] please come in thru the  Main Entrance and check in at the main information desk  You need to re-schedule your appointment should you arrive 10 or more minutes late.  We strive to give you quality time with our providers, and arriving late affects you and other patients whose appointments are after yours.  Also, if you no show three or more times for appointments you may be dismissed from the clinic at the providers discretion.     Again, thank you for choosing [facility].  Our hope is that these requests will decrease the amount of time that you wait before being seen by our physicians.       _____________________________________________________________  Should you have questions after your visit to [facility], please contact our office at [phone] between the hours of 8:30 a.m. and 4:30 p.m.  Voicemails left after 4:30 p.m. will not be returned until the following business day.  For prescription refill requests, have your pharmacy contact our office.         Resources For Cancer Patients and their Caregivers ? American Cancer Society: Can assist with transportation, wigs, general needs, runs Look Good Feel Better.        504 231 0353 ? Cancer  Care: Provides financial assistance, online support groups, medication/co-pay assistance.  1-800-813-HOPE (437)324-2191) ? [place] Assists [place] cancer patients and their families through emotional , educational and financial support.  351-298-6830 ? [place] DSS Where to apply for food stamps, Medicaid and utility assistance. 718-623-6462 ? RCATS: Transportation to medical appointments. 628-405-4489 ? Social Security Administration: May apply for disability if have a Stage IV cancer. 9806588808 308-397-3028 ? [employer], Disability and Transit Services: Assists with nutrition, care and transit needs. [place] Support Programs: @10RELATIVEDAYS @ > Cancer Support Group  2nd Tuesday of the month 1pm-2pm, Journey Room  > Creative Journey  3rd Tuesday of the month 1130am-1pm, Journey Room  > Look Good Feel Better  1st Wednesday of the month 10am-12 noon, Journey Room (Call [place] to register [phone])

## 2016-03-22 NOTE — Progress Notes (Signed)
[facility] at [place] Note  Patient Care Team: [name], MD as PCP - General [name], MD as Consulting Physician (Ophthalmology) [name], MD as Consulting Physician (Gastroenterology)  CHIEF COMPLAINTS/PURPOSE OF CONSULTATION:  Low rectal mass with high grade glandular dysplasia Severe constipation, unable to pass stool without stool softener Colonoscopy on 11/01/15 with Dr. [name] revealing large fungating rectal mass, precluded colonoscopy Weight loss CT Abdomen/Pelvis 11/01/15 with irregular annular thickening in mid rectum with rectal intussusception, 4 hypodense subcentimeter liver lesions too small to characterize, chronic infectious or inflammatory bronchiolitis at the lung bases COPD Transanal minimally invasive surgery with excision of rectal polyp with Dr. [name] on 0/12/6376 Final pathology MMR IHC normal, moderately differentiated rectal adenocarcinoma 4.3 cm, tumor invades through muscularis propria into pericolorectal tissue, margins of resection positive T3,NX,MX    Rectal adenocarcinoma ([place])   11/01/2015 Procedure Colonoscopy by Dr. [name]   11/01/2015 Pathology Results Rectum, biopsy, mass - FRAGMENTS OF TUBULAR ADENOMA WITH HIGH GRADE GLANDULAR DYSPLASIA.   11/07/2015 Imaging CT abd/pelvis- Irregular annular wall thickening in the mid rectum with associated rectal intussusception, presumably representing a primary rectal neoplasm. Borderline prominent upper presacral lymph node, possibly metastatic.   12/14/2015 Definitive Surgery Rectum, resection, rectal mass by Dr. [name]   03/18/8501 Pathology Results Rectum, resection, rectal mass MODERATELY DIFFERENTIATED RECTAL ADENOCARCINOMA (4.3 CM) THE TUMOR INVADES THROUGH THE MUSCULARIS PROPRIA INTO PERICOLORECTAL TISSUE MARGINS OF RESECTION IS POSITIVE   12/14/2015 Pathology Results There is a very low probability that microsatellite instability (MSI) is present.   01/15/2016 Procedure Colonoscopy by Dr. [name]   01/15/2016 Pathology Results 1. Colon, polyp(s), descending - TUBULAR ADENOMA. NO HIGH GRADE DYSPLASIA OR MALIGNANCY IDENTIFIED. 2. Colon, polyp(s), splenic flexure - TUBULAR ADENOMA. NO HIGH GRADE DYSPLASIA OR MALIGNANCY IDENTIFIED. 3. Colon, polyp(s), ascending - TUBULAR ADENO   01/24/2016 Imaging CT abd/pelvis- Nonspecific mild residual rectal wall thickening with associated mild perirectal fat stranding posteriorly in the mid to upper rectum (8-9 cm superior to the anal verge), which could represent postsurgical changes and/or residual tumor.    02/20/2016 -  Chemotherapy 5FU CI with XRT   02/20/2016 -  Radiation Therapy Dr. Pablo Ledger     HISTORY OF PRESENTING ILLNESS:  Meghan Anderson 80 y.o. female is here for follow-up of her rectal carcinoma.   [name] is accompanied by family. She has 7 radiation treatments left to complete.  States, "It has been a pretty good week". Her appetite is fair and her bowels have been loose. She only wakes up at night to urinate, but not with diarrhea. She has experienced some discomfort with urination and has given a urine sample. She is unsure how many times she goes to the bathroom, admitting to maybe 3.    Denies nausea or vomiting. Her breathing has been good. Her daughter-in-law notes her lower extremities "swell some".  Appetite is considered good. Activity level is baseline.  MEDICAL HISTORY:  Past Medical History  Diagnosis Date  . Hypertension   . Hyperlipidemia   . Chronic bronchitis ([place])   . Thyroid disease   . GERD (gastroesophageal reflux disease)   . Arthritis   . Murmur, heart   . Cataract   . Hypothyroidism   . Complication of anesthesia   . PONV (postoperative nausea and vomiting)   . COPD (chronic obstructive pulmonary disease) ([place])   . Rectal adenocarcinoma ([place])   . Chronic renal disease, stage 3, moderately decreased glomerular filtration rate (  GFR) between 30-59 mL/min/1.73  square meter 02/21/2016    SURGICAL HISTORY: Past Surgical History  Procedure Laterality Date  . Left ear    . Abdominal hysterectomy      complete  . Colonoscopy N/A 11/01/2015    RMR: Large fungating rectal mass precluded colonoscopy. Status post biopsy.   . Transanal endoscopic microsurgery N/A 12/14/2015    Procedure: TRANSANAL ENDOSCOPIC MICROSURGERY OF RECTAL POLYP;  Surgeon: [name], MD;  Location: [facility] ORS;  Service: General;  Laterality: N/A;  . Colonoscopy N/A 01/15/2016    Procedure: COLONOSCOPY;  Surgeon: [name], MD;  Location: [facility] END[facility] SUITE;  Service: Endoscopy;  Laterality: N/A;  130     SOCIAL HISTORY: Social History   Social History  . Marital Status: Widowed    Spouse Name: N/A  . Number of Children: N/A  . Years of Education: N/A   Occupational History  . Not on file.   Social History Main Topics  . Smoking status: Former Smoker    Quit date: 11/11/2010  . Smokeless tobacco: Never Used  . Alcohol Use: No  . Drug Use: No  . Sexual Activity: Not on file   Other Topics Concern  . Not on file   Social History Narrative   Widowed for 38 years. 2 sons and 2 daughters. 9 grandchildren 11 great-grandchildren Ex smoker, quit about 2 years ago.  She enjoys painting and riding her golf cart [place] the farm.  She is from this area.   FAMILY HISTORY: Family History  Problem Relation Age of Onset  . Kidney disease Mother     nephrectomy x 1  . COPD Brother   . Emphysema Brother   . Asthma Brother   . Cancer Brother 51    brain  . Colon cancer Neg Hx    indicated that her mother is deceased. She indicated that her father is deceased. She indicated that her sister is alive. She indicated that three of her six brothers are alive.   Mother deceased at 8 yo, congestive heart failure. Father deceased at 57 yo, lung troubles, gall stones, and congestive heart failure.  6 siblings. Only two living. Her baby brother and her twin brother are still  living. One brother died of a brain tumor in his 84s. Lived with brain tumor for about 10 years. One brother had lung problems and died of a heart attack in his 45s. One brother had stomach cancer and died in his early 7s. One sister died at 28 yo of congestive heart failure.  ALLERGIES:  is allergic to moxifloxacin hcl in nacl; levaquin; prednisone; and quinolones.  MEDICATIONS:  Current Outpatient Prescriptions  Medication Sig Dispense Refill  . albuterol (PROVENTIL) (2.5 MG/3ML) 0.083% nebulizer solution NEBULIZE 1 VIAL EVERY 4 HOURS AS NEEDED (Patient taking differently: NEBULIZE 1 VIAL EVERY 4 HOURS AS NEEDED FOR SHORTNESS OF BREATHE.) 150 mL 2  . amLODipine (NORVASC) 5 MG tablet Take 1 tablet (5 mg total) by mouth daily. 30 tablet 5  . atorvastatin (LIPITOR) 80 MG tablet Take 1 tablet (80 mg total) by mouth daily. (Patient taking differently: Take 40 mg by mouth daily at 12 noon. ) 90 tablet 3  . dextrose 5 % SOLN 1,000 mL with fluorouracil 5 GM/100ML SOLN Inject into the vein. Daily throughout radiation. Starting 02/21/16    . fluticasone (FLONASE) 50 MCG/ACT nasal spray Place 2 sprays into both nostrils daily as needed. (Patient taking differently: Place 2 sprays into both nostrils daily as needed  for allergies or rhinitis. ) 16 g 1  . Fluticasone Furoate-Vilanterol (BREO ELLIPTA) 100-25 MCG/INH AEPB USE 1 PUFF ONCE DAILY    . HYDROcodone-acetaminophen (NORCO/VICODIN) 5-325 MG tablet Take 1-2 tablets by mouth every 4 (four) hours as needed for moderate pain. 30 tablet 0  . levothyroxine (SYNTHROID, LEVOTHROID) 150 MCG tablet Take 1 tablet (150 mcg total) by mouth daily. 90 tablet 3  . lisinopril-hydrochlorothiazide (PRINZIDE,ZESTORETIC) 20-12.5 MG tablet TAKE TWO TABLETS BY MOUTH DAILY 60 tablet 3  . omeprazole (PRILOSEC) 40 MG capsule TAKE ONE (1) CAPSULE EACH DAY 30 capsule 2  . ondansetron (ZOFRAN) 8 MG tablet Take 1 tablet (8 mg total) by mouth every 8 (eight) hours as needed for  nausea or vomiting. 30 tablet 2  . polyethylene glycol powder (GLYCOLAX/MIRALAX) powder Take 17 g by mouth 2 (two) times daily as needed. 3350 g 1  . potassium chloride SA (K-DUR,KLOR-CON) 20 MEQ tablet TAKE ONE (1) TABLET EACH DAY 30 tablet 3  . temazepam (RESTORIL) 15 MG capsule Take 1-2 capsules (15-30 mg total) by mouth at bedtime as needed for sleep. 45 capsule 1  . tiotropium (SPIRIVA HANDIHALER) 18 MCG inhalation capsule Place 1 capsule (18 mcg total) into inhaler and inhale daily. 90 capsule 3   No current facility-administered medications for this visit.    Review of Systems  Constitutional: Negative for fever, chills, weight loss and malaise/fatigue.  HENT: Negative.  Negative for congestion, hearing loss, nosebleeds, sore throat and tinnitus.   Eyes: Negative.  Negative for blurred vision, double vision, pain and discharge.  Respiratory: Negative for cough, hemoptysis, sputum production and wheezing.   Cardiovascular: Negative.  Negative for chest pain, palpitations, claudication, leg swelling and PND.  Gastrointestinal: Positive for diarrhea. Negative for heartburn, nausea, vomiting, abdominal pain, blood in stool and melena.       Loose bowels.  Genitourinary: Positive for dysuria and frequency. Negative for urgency and hematuria.  Musculoskeletal: Negative.  Negative for myalgias, joint pain and falls.  Skin: Negative.  Negative for itching and rash.  Neurological: Negative.  Negative for dizziness, tingling, tremors, sensory change, speech change, focal weakness, seizures, loss of consciousness, weakness and headaches.  Endo/Heme/Allergies: Negative.  Does not bruise/bleed easily.  Psychiatric/Behavioral: Negative.  Negative for depression, suicidal ideas, memory loss and substance abuse. The patient is not nervous/anxious and does not have insomnia.   All other systems reviewed and are negative.  14 point ROS was done and is otherwise as detailed above or in HPI   PHYSICAL  EXAMINATION: ECOG PERFORMANCE STATUS: 1 - Symptomatic but completely ambulatory Vitals with BMI 03/22/2016  Height   Weight 130 lbs 6 oz  BMI   Systolic 353  Diastolic 85  Pulse 98  Respirations 18   Physical Exam  Constitutional: She is oriented to person, place, and time and well-developed, well-nourished, and in no distress.  HENT:  Head: Normocephalic and atraumatic.  Nose: Nose normal.  Mouth/Throat: Oropharynx is clear and moist. No oropharyngeal exudate.  Eyes: Conjunctivae and EOM are normal. Pupils are equal, round, and reactive to light. Right eye exhibits no discharge. Left eye exhibits no discharge. No scleral icterus.  Neck: Normal range of motion. Neck supple. No JVD present. No tracheal deviation present. No thyromegaly present.  Cardiovascular: Normal rate, regular rhythm and normal heart sounds.  Exam reveals no gallop and no friction rub.   No murmur heard. Pulmonary/Chest: Effort normal and breath sounds normal. She has no wheezes. She has no rales.  Abdominal: Soft.  Bowel sounds are normal. She exhibits no distension and no mass. There is no tenderness. There is no rebound and no guarding.  Musculoskeletal: Normal range of motion. She exhibits edema.  Trace edema at the ankles.  Lymphadenopathy:    She has no cervical adenopathy.  Neurological: She is alert and oriented to person, place, and time. She has normal reflexes. No cranial nerve deficit. Gait normal. Coordination normal.  Skin: Skin is warm and dry. No rash noted.  Psychiatric: Mood, memory, affect and judgment normal.  Nursing note and vitals reviewed.   PATHOLOGY:            LABORATORY DATA:  I have reviewed the data as listed Lab Results  Component Value Date   WBC 5.0 03/22/2016   HGB 10.1* 03/22/2016   HCT 30.9* 03/22/2016   MCV 94.8 03/22/2016   PLT 156 03/22/2016   CMP     Component Value Date/Time   NA 137 03/22/2016 1015   NA 140 11/07/2015 1255   K 3.3* 03/22/2016 1015    CL 105 03/22/2016 1015   CO2 22 03/22/2016 1015   GLUCOSE 130* 03/22/2016 1015   GLUCOSE 94 11/07/2015 1255   BUN 17 03/22/2016 1015   BUN 14 11/07/2015 1255   CREATININE 1.34* 03/22/2016 1015   CALCIUM 7.8* 03/22/2016 1015   PROT 6.3* 03/22/2016 1015   PROT 5.8* 12/29/2014 1155   ALBUMIN 3.3* 03/22/2016 1015   ALBUMIN 3.5 12/29/2014 1155   AST 18 03/22/2016 1015   ALT 11* 03/22/2016 1015   ALKPHOS 43 03/22/2016 1015   BILITOT 0.3 03/22/2016 1015   BILITOT 0.2 12/29/2014 1155   GFRNONAA 37* 03/22/2016 1015   GFRAA 43* 03/22/2016 1015    RADIOGRAPHIC STUDIES: I have personally reviewed the radiological images as listed and agreed with the findings in the report. Study Result     CLINICAL DATA: 80 year old female with reported history of rectal mass on recent colonoscopy and weight loss. Patient reports chronic constipation and rectal bleeding. Gastroesophageal reflux disease. Prior hysterectomy.  EXAM: CT ABDOMEN AND PELVIS WITH CONTRAST  TECHNIQUE: Multidetector CT imaging of the abdomen and pelvis was performed using the standard protocol following bolus administration of intravenous contrast.  CONTRAST: 26m OMNIPAQUE IOHEXOL 300 MG/ML SOLN  COMPARISON: None.  FINDINGS: Lower chest: There are patchy ground-glass opacities with associated mild cylindrical bronchiectasis in the visualized right middle lobe, lingula and right greater than left lower lobes.  Hepatobiliary: There are four tiny hypodense liver lesions scattered throughout the liver, largest 0.5 cm in segment 5 of the right liver lobe (series 2/ image 21), too small to characterize. Otherwise normal liver. Normal gallbladder with no radiopaque cholelithiasis. No biliary ductal dilatation.  Pancreas: Normal, with no mass or duct dilation.  Spleen: Normal size. No mass.  Adrenals/Urinary Tract: Normal adrenals. Simple 1.5 cm renal cyst in the posterior upper right kidney. There are  a few additional subcentimeter hypodense lesions in both kidneys, too small to characterize. No hydronephrosis. Normal bladder.  Stomach/Bowel: Grossly normal stomach. Normal caliber small bowel with no small bowel wall thickening. Appendix is not discretely visualized. Moderate sigmoid diverticulosis, with no large bowel wall thickening or pericolonic fat stranding. There is irregular annular wall thickening in the mid rectum with associated intussusception, best seen on the coronal series 4/image 63.  Vascular/Lymphatic: Atherosclerotic nonaneurysmal abdominal aorta. Patent portal, splenic, hepatic and renal veins. No pathologically enlarged lymph nodes in the abdomen or pelvis. There is a borderline prominent 0.7 cm upper presacral  node in the midline (series 2/image 53).  Reproductive: Status post hysterectomy, with no abnormal findings at the vaginal cuff. No adnexal mass.  Other: No pneumoperitoneum, ascites or focal fluid collection.  Musculoskeletal: No aggressive appearing focal osseous lesions. Asymmetric moderate osteoarthritis in the left hip joint. Marked degenerative changes in the visualized thoracolumbar spine.  IMPRESSION: 1. Irregular annular wall thickening in the mid rectum with associated rectal intussusception, presumably representing a primary rectal neoplasm. 2. Borderline prominent upper presacral lymph node, possibly metastatic. 3. Four hypodense subcentimeter liver lesions, too small to characterize. If the rectal mass proves to represent a rectal malignancy, a follow-up CT abdomen with IV contrast or MRI abdomen with and without IV contrast is recommended in 3 months. 4. Chronic infectious or inflammatory bronchiolitis at the lung bases, likely due to atypical mycobacterial infection (MAI) or recurrent aspiration. 5. Moderate sigmoid diverticulosis.   Electronically Signed  By: Ilona Sorrel M.D.  On: 11/07/2015 09:28    CLINICAL  DATA: 80 year old female status post minimally invasive trans anal excision of rectal adenocarcinoma on 12/14/2015 with positive margins, presenting for treatment planning.  EXAM: CT PELVIS WITH CONTRAST    IMPRESSION: 1. Nonspecific mild residual rectal wall thickening with associated mild perirectal fat stranding posteriorly in the mid to upper rectum (8-9 cm superior to the anal verge), which could represent postsurgical changes and/or residual tumor. 2. No pelvic lymphadenopathy. No evidence of metastatic disease in the pelvis. 3. Moderate sigmoid diverticulosis.   Electronically Signed  By: Ilona Sorrel M.D.  On: 01/24/2016 09:10  ASSESSMENT & PLAN:  T3 rectal carcinoma Completion colonoscopy on 01/15/2016 with multiple colonic polyps, tubular adenoma Near obstructing lesion, s/p transanal resection with Dr. Marcello Moores Colonoscopy on 11/01/15 with Dr. [name] revealing large fungating rectal mass, precluded colonoscopy Weight loss CT Abdomen/Pelvis 11/01/15 with irregular annular thickening in mid rectum with rectal intussusception, 4 hypodense subcentimeter liver lesions too small to characterize, chronic infectious or inflammatory bronchiolitis at the lung bases COPD Anemia CKD, stage 3 CT pelvis 01/24/2016 with no pelvic LAD, no evidence of metastatic disease in the pelvis Concurrent 5-FU/XRT Chemotherapy induced diarrhea  We have dose reduced her 5-FU and she has had no further problems. She has done very well overall with therapy.   She has 7 radiation treatments left to complete.  The patient will contact us if she experiences any symptoms, including abdominal pain, nausea, vomiting, or severe diarrhea.   She will return next Friday for follow up. Plan will be to get her back to Dr. Marcello Moores for further recommendations after she has completed concurrent therapy.   All questions were answered. The patient knows to call the clinic with any problems, questions or  concerns.  This document serves as a record of services personally performed by Ancil Linsey, MD. It was created on her behalf by Arlyce Harman, a trained medical scribe. The creation of this record is based on the scribe's personal observations and the provider's statements to them. This document has been checked and approved by the attending provider.  I have reviewed the above documentation for accuracy and completeness, and I agree with the above.  This note was electronically signed.    Molli Hazard, MD   03/25/2016 8:28 AM

## 2016-03-22 NOTE — Progress Notes (Signed)
Meghan Anderson presented for PICC line dressing change.  See IV assessment in docflowsheets for PICC details.  Proper placement of PICC confirmed by CXR.  PICC located right arm.  good blood return and  PICC flushed with 55ml NS and 250U Heparin, see MAR for further details.  Meghan Anderson tolerated procedure well and without incident.    14fu disconnected.  Labs drawn.  11fu connected.  Pt discharged ambulatory

## 2016-03-22 NOTE — Patient Instructions (Signed)
[facility] at [facility] Discharge Instructions  RECOMMENDATIONS MADE BY THE CONSULTANT AND ANY TEST RESULTS WILL BE SENT TO YOUR REFERRING PHYSICIAN.    Thank you for choosing [facility] at [facility] to provide your oncology and hematology care.  To afford each patient quality time with our provider, please arrive at least 15 minutes before your scheduled appointment time.   Beginning January 23rd 2017 lab work for the Cancer Center will be done in the  Main lab at [place] on 1st floor. If you have a lab appointment with the Cancer Center please come in thru the  Main Entrance and check in at the main information desk  You need to re-schedule your appointment should you arrive 10 or more minutes late.  We strive to give you quality time with our providers, and arriving late affects you and other patients whose appointments are after yours.  Also, if you no show three or more times for appointments you may be dismissed from the clinic at the providers discretion.     Again, thank you for choosing [facility].  Our hope is that these requests will decrease the amount of time that you wait before being seen by our physicians.       _____________________________________________________________  Should you have questions after your visit to [facility], please contact our office at [phone] between the hours of 8:30 a.m. and 4:30 p.m.  Voicemails left after 4:30 p.m. will not be returned until the following business day.  For prescription refill requests, have your pharmacy contact our office.         Resources For Cancer Patients and their Caregivers ? American Cancer Society: Can assist with transportation, wigs, general needs, runs Look Good Feel Better.        1-888-227-6333 ? Cancer Care: Provides financial assistance, online support groups, medication/co-pay assistance.  1-800-813-HOPE (4673) ? [facility] Assists [place] cancer patients and their families through emotional , educational and financial support.  336-427-4357 ? [place] DSS Where to apply for food stamps, Medicaid and utility assistance. 336-342-1394 ? RCATS: Transportation to medical appointments. 336-347-2287 ? Social Security Administration: May apply for disability if have a Stage IV cancer. 336-342-7796 1-800-772-1213 ? [place] Aging, Disability and Transit Services: Assists with nutrition, care and transit needs. 336-349-2343  Cancer Center Support Programs: @10RELATIVEDAYS@ > Cancer Support Group  2nd Tuesday of the month 1pm-2pm, Journey Room  > Creative Journey  3rd Tuesday of the month 1130am-1pm, Journey Room  > Look Good Feel Better  1st Wednesday of the month 10am-12 noon, Journey Room (Call American Cancer Society to register 1-800-395-5775)   

## 2016-03-25 DIAGNOSIS — R300 Dysuria: Secondary | ICD-10-CM | POA: Diagnosis not present

## 2016-03-25 DIAGNOSIS — C20 Malignant neoplasm of rectum: Secondary | ICD-10-CM | POA: Diagnosis not present

## 2016-03-25 DIAGNOSIS — Z510 Encounter for antineoplastic radiation therapy: Secondary | ICD-10-CM | POA: Diagnosis not present

## 2016-03-25 LAB — POCT I-STAT, CHEM 8
BUN: 15 mg/dL (ref 6–20)
CALCIUM ION: 1.06 mmol/L — AB (ref 1.13–1.30)
CHLORIDE: 100 mmol/L — AB (ref 101–111)
CREATININE: 1.3 mg/dL — AB (ref 0.44–1.00)
GLUCOSE: 109 mg/dL — AB (ref 65–99)
HCT: 36 % (ref 36.0–46.0)
Hemoglobin: 12.2 g/dL (ref 12.0–15.0)
Potassium: 3.5 mmol/L (ref 3.5–5.1)
Sodium: 140 mmol/L (ref 135–145)
TCO2: 25 mmol/L (ref 0–100)

## 2016-03-26 DIAGNOSIS — Z510 Encounter for antineoplastic radiation therapy: Secondary | ICD-10-CM | POA: Diagnosis not present

## 2016-03-26 DIAGNOSIS — C20 Malignant neoplasm of rectum: Secondary | ICD-10-CM | POA: Diagnosis not present

## 2016-03-26 DIAGNOSIS — R300 Dysuria: Secondary | ICD-10-CM | POA: Diagnosis not present

## 2016-03-27 ENCOUNTER — Inpatient Hospital Stay (HOSPITAL_COMMUNITY): Payer: Medicare Other

## 2016-03-27 DIAGNOSIS — Z510 Encounter for antineoplastic radiation therapy: Secondary | ICD-10-CM | POA: Diagnosis not present

## 2016-03-27 DIAGNOSIS — C20 Malignant neoplasm of rectum: Secondary | ICD-10-CM | POA: Diagnosis not present

## 2016-03-27 DIAGNOSIS — R300 Dysuria: Secondary | ICD-10-CM | POA: Diagnosis not present

## 2016-03-28 DIAGNOSIS — Z510 Encounter for antineoplastic radiation therapy: Secondary | ICD-10-CM | POA: Diagnosis not present

## 2016-03-28 DIAGNOSIS — R300 Dysuria: Secondary | ICD-10-CM | POA: Diagnosis not present

## 2016-03-28 DIAGNOSIS — C20 Malignant neoplasm of rectum: Secondary | ICD-10-CM | POA: Diagnosis not present

## 2016-03-29 ENCOUNTER — Encounter (HOSPITAL_BASED_OUTPATIENT_CLINIC_OR_DEPARTMENT_OTHER): Payer: Medicare Other

## 2016-03-29 VITALS — BP 148/94 | HR 75 | Temp 97.9°F | Wt 127.2 lb

## 2016-03-29 DIAGNOSIS — C20 Malignant neoplasm of rectum: Secondary | ICD-10-CM | POA: Diagnosis not present

## 2016-03-29 DIAGNOSIS — Z510 Encounter for antineoplastic radiation therapy: Secondary | ICD-10-CM | POA: Diagnosis not present

## 2016-03-29 DIAGNOSIS — Z5111 Encounter for antineoplastic chemotherapy: Secondary | ICD-10-CM | POA: Diagnosis present

## 2016-03-29 DIAGNOSIS — C19 Malignant neoplasm of rectosigmoid junction: Secondary | ICD-10-CM | POA: Diagnosis not present

## 2016-03-29 DIAGNOSIS — R300 Dysuria: Secondary | ICD-10-CM | POA: Diagnosis not present

## 2016-03-29 LAB — COMPREHENSIVE METABOLIC PANEL
ALT: 11 U/L — ABNORMAL LOW (ref 14–54)
ANION GAP: 8 (ref 5–15)
AST: 17 U/L (ref 15–41)
Albumin: 3.3 g/dL — ABNORMAL LOW (ref 3.5–5.0)
Alkaline Phosphatase: 45 U/L (ref 38–126)
BUN: 20 mg/dL (ref 6–20)
CHLORIDE: 105 mmol/L (ref 101–111)
CO2: 26 mmol/L (ref 22–32)
CREATININE: 1.36 mg/dL — AB (ref 0.44–1.00)
Calcium: 7.2 mg/dL — ABNORMAL LOW (ref 8.9–10.3)
GFR, EST AFRICAN AMERICAN: 42 mL/min — AB (ref >60)
GFR, EST NON AFRICAN AMERICAN: 36 mL/min — AB (ref >60)
GLUCOSE: 101 mg/dL — AB (ref 65–99)
Potassium: 3.2 mmol/L — ABNORMAL LOW (ref 3.5–5.1)
SODIUM: 139 mmol/L (ref 135–145)
Total Bilirubin: 0.5 mg/dL (ref 0.3–1.2)
Total Protein: 6 g/dL — ABNORMAL LOW (ref 6.5–8.1)

## 2016-03-29 LAB — CBC WITH DIFFERENTIAL/PLATELET
Basophils Absolute: 0 10*3/uL (ref 0.0–0.1)
Basophils Relative: 0 %
EOS ABS: 0.6 10*3/uL (ref 0.0–0.7)
EOS PCT: 11 %
HCT: 30.1 % — ABNORMAL LOW (ref 36.0–46.0)
Hemoglobin: 10 g/dL — ABNORMAL LOW (ref 12.0–15.0)
LYMPHS ABS: 0.2 10*3/uL — AB (ref 0.7–4.0)
LYMPHS PCT: 3 %
MCH: 31.5 pg (ref 26.0–34.0)
MCHC: 33.2 g/dL (ref 30.0–36.0)
MCV: 95 fL (ref 78.0–100.0)
MONO ABS: 0.5 10*3/uL (ref 0.1–1.0)
Monocytes Relative: 10 %
Neutro Abs: 3.9 10*3/uL (ref 1.7–7.7)
Neutrophils Relative %: 76 %
PLATELETS: 160 10*3/uL (ref 150–400)
RBC: 3.17 MIL/uL — AB (ref 3.87–5.11)
RDW: 17.2 % — AB (ref 11.5–15.5)
WBC: 5.2 10*3/uL (ref 4.0–10.5)

## 2016-03-29 MED ORDER — SODIUM CHLORIDE 0.9 % IV SOLN
180.0000 mg/m2/d | INTRAVENOUS | Status: DC
  Filled 2016-03-29: qty 23

## 2016-03-29 MED ORDER — SODIUM CHLORIDE 0.9 % IV SOLN
180.0000 mg/m2/d | INTRAVENOUS | Status: DC
  Administered 2016-03-29: 1150 mg via INTRAVENOUS
  Filled 2016-03-29: qty 23

## 2016-03-29 MED ORDER — POTASSIUM CHLORIDE CRYS ER 20 MEQ PO TBCR
40.0000 meq | EXTENDED_RELEASE_TABLET | Freq: Once | ORAL | Status: AC
  Administered 2016-03-29: 40 meq via ORAL
  Filled 2016-03-29: qty 2

## 2016-03-29 NOTE — Progress Notes (Signed)
Patient labs WNL for patient, chemotherapy pump reapplied per MD discretion.  To return Tuesday after last radiation appointment for pump removal and PICC dressing change.  Will discuss further need for PICC care at [place] appointment.  Patient and son verbalize understanding of all information.    PICC dressing change via sterile technique.  Catheter remains fully inserted.  No s/s infection.  Patient tolerated well.

## 2016-03-29 NOTE — Patient Instructions (Signed)
[facility] Discharge Instructions for Patients Receiving Chemotherapy   Beginning January 23rd 2017 lab work for the [facility] will be done in the  Main lab at [facility] on 1st floor. If you have a lab appointment with the [facility] please come in thru the  Main Entrance and check in at the main information desk   Today you received the following chemotherapy agent: 5FU pump refill.     If you develop nausea and vomiting, or diarrhea that is not controlled by your medication, call the clinic.  The clinic phone number is [phone]. Office hours are Monday-Friday 8:30am-5:00pm.  BELOW ARE SYMPTOMS THAT SHOULD BE REPORTED IMMEDIATELY:  *FEVER GREATER THAN 101.0 F  *CHILLS WITH OR WITHOUT FEVER  NAUSEA AND VOMITING THAT IS NOT CONTROLLED WITH YOUR NAUSEA MEDICATION  *UNUSUAL SHORTNESS OF BREATH  *UNUSUAL BRUISING OR BLEEDING  TENDERNESS IN MOUTH AND THROAT WITH OR WITHOUT PRESENCE OF ULCERS  *URINARY PROBLEMS  *BOWEL PROBLEMS  UNUSUAL RASH Items with * indicate a potential emergency and should be followed up as soon as possible. If you have an emergency after office hours please contact your primary care physician or go to the nearest emergency department.  Please call the clinic during office hours if you have any questions or concerns.   You may also contact the Patient Navigator at [phone] should you have any questions or need assistance in obtaining follow up care.      Resources For Cancer Patients and their Caregivers ? American Cancer Society: Can assist with transportation, wigs, general needs, runs Look Good Feel Better.        (818)849-8019 ? Cancer Care: Provides financial assistance, online support groups, medication/co-pay assistance.  1-800-813-HOPE 424-002-6682) ? [place] Assists [place] cancer patients and their families through emotional , educational and financial support.   762-580-0607 ? [place] DSS Where to apply for food stamps, Medicaid and utility assistance. (959)725-5875 ? RCATS: Transportation to medical appointments. (470)625-0128 ? Social Security Administration: May apply for disability if have a Stage IV cancer. (724) 424-6709 818-810-2855 ? [employer], Disability and Transit Services: Assists with nutrition, care and transit needs. 8720668958

## 2016-04-01 ENCOUNTER — Encounter (HOSPITAL_COMMUNITY): Payer: Medicare Other

## 2016-04-01 DIAGNOSIS — C20 Malignant neoplasm of rectum: Secondary | ICD-10-CM | POA: Diagnosis not present

## 2016-04-01 DIAGNOSIS — R300 Dysuria: Secondary | ICD-10-CM | POA: Diagnosis not present

## 2016-04-01 DIAGNOSIS — Z510 Encounter for antineoplastic radiation therapy: Secondary | ICD-10-CM | POA: Diagnosis not present

## 2016-04-02 ENCOUNTER — Encounter (HOSPITAL_BASED_OUTPATIENT_CLINIC_OR_DEPARTMENT_OTHER): Payer: Medicare Other

## 2016-04-02 ENCOUNTER — Encounter (HOSPITAL_COMMUNITY): Payer: Self-pay

## 2016-04-02 DIAGNOSIS — C20 Malignant neoplasm of rectum: Secondary | ICD-10-CM

## 2016-04-02 DIAGNOSIS — Z510 Encounter for antineoplastic radiation therapy: Secondary | ICD-10-CM | POA: Diagnosis not present

## 2016-04-02 DIAGNOSIS — R300 Dysuria: Secondary | ICD-10-CM | POA: Diagnosis not present

## 2016-04-02 NOTE — Progress Notes (Signed)
PICC line removed via sterile technique.  Pressure dressing applied.  No active bleeding at site.  Patient and daughters educated on post PICC removal care as well as given written instruction.  They were given dressing reinforcement supplies and educated to go to the ER immediately if the patient started bleeding at site.  They all verbalized understanding.  Patient was monitored for 30 minutes post removal and left without any complication.  VSS.

## 2016-04-02 NOTE — Patient Instructions (Addendum)
[place] at [facility] Discharge Instructions  RECOMMENDATIONS MADE BY THE CONSULTANT AND ANY TEST RESULTS WILL BE SENT TO YOUR REFERRING PHYSICIAN.  Pump removal and PICC line flush removal. Return for follow-up visit with Kirby Crigler, PA in 2 weeks.  Call the clinic should you have any questions or concerns prior to your next visit.   Thank you for choosing [place] at [facility] to provide your oncology and hematology care.  To afford each patient quality time with our provider, please arrive at least 15 minutes before your scheduled appointment time.   Beginning January 23rd 2017 lab work for the [facility] will be done in the  Main lab at [facility] on 1st floor. If you have a lab appointment with the [facility] please come in thru the  Main Entrance and check in at the main information desk  You need to re-schedule your appointment should you arrive 10 or more minutes late.  We strive to give you quality time with our providers, and arriving late affects you and other patients whose appointments are after yours.  Also, if you no show three or more times for appointments you may be dismissed from the clinic at the providers discretion.     Again, thank you for choosing [facility].  Our hope is that these requests will decrease the amount of time that you wait before being seen by our physicians.       _____________________________________________________________  Should you have questions after your visit to [facility], please contact our office at [phone] between the hours of 8:30 a.m. and 4:30 p.m.  Voicemails left after 4:30 p.m. will not be returned until the following business day.  For prescription refill requests, have your pharmacy contact our office.         Resources For Cancer Patients and their Caregivers ? American Cancer Society: Can assist with transportation, wigs, general  needs, runs Look Good Feel Better.        785-664-2925 ? Cancer Care: Provides financial assistance, online support groups, medication/co-pay assistance.  1-800-813-HOPE 361-468-7342) ? [facility] Assists [place] cancer patients and their families through emotional , educational and financial support.  (913) 726-0868 ? [place] DSS Where to apply for food stamps, Medicaid and utility assistance. 807-267-1989 ? RCATS: Transportation to medical appointments. (938) 065-5101 ? Social Security Administration: May apply for disability if have a Stage IV cancer. (343)[phone] ? [employer], Disability and Transit Services: Assists with nutrition, care and transit needs. [facility] Support Programs: @10RELATIVEDAYS @ > Cancer Support Group  2nd Tuesday of the month 1pm-2pm, Journey Room  > Creative Journey  3rd Tuesday of the month 1130am-1pm, Journey Room  > Look Good Feel Better  1st Wednesday of the month 10am-12 noon, Journey Room (Call American Cancer Society to register 754-584-0802)  PICC Removal A peripherally inserted central catheter (PICC) is a long, thin, flexible tube that a health care provider can insert into a vein in your upper arm. It is a type of IV. Having a PICC in place gives health care providers quick access to your veins. It is a good way to distribute medicines and fluids quickly throughout your body. LET [facility] CARE PROVIDER KNOW ABOUT:  Any allergies you have.  All medicines you are taking, including vitamins, herbs, eye drops, creams, and over-the-counter medicines.  Previous problems you or members of your family  have had with the use of anesthetics.  Any blood disorders you have.  Previous surgeries you have had.  Medical conditions you have. RISKS AND COMPLICATIONS Generally, this is a safe procedure. However, as with any procedure, problems can occur. Possible problems  include:  Bleeding.  Infection. BEFORE THE PROCEDURE You need an order from your health care provider to have your PICC removed. Only a health care provider trained in PICC removal should take it out.You may have your PICC removed in the hospital or in an outpatient setting. PROCEDURE Having a PICC removed is usually painless. Removal of the tape that holds the PICC in place may be the most uncomfortable part. Do not take out the PICC yourself. Only a trained clinical professional, such as a PICC nurse, should remove it. If your health care provider thinks your PICC is infected, the tip may be sent to the lab for testing. After taking out your PICC, your health care provider may:   Hold gentle pressure on the exit site.  Apply some antibiotic ointment.  Place a small bandage over the insertion site. AFTER THE PROCEDURE You should be able to remove the bandage after 24 hours. Follow all your health care provider's instructions.   Keep the insertion site clean by washing it gently with soap and water.  Do not pick or remove a scab.  Avoid strenuous physical activity for a day or two.  Let your health care provider know if you develop redness, soreness, bleeding, swelling, or drainage from the insertion site.  Let your health care provider know if you develop chills or fever.   This information is not intended to replace advice given to you by your health care provider. Make sure you discuss any questions you have with your health care provider.   Document Released: 04/17/2010 Document Revised: 03/14/2015 Document Reviewed: 08/20/2013 Elsevier Interactive Patient Education [employer].

## 2016-04-10 ENCOUNTER — Ambulatory Visit (HOSPITAL_COMMUNITY): Payer: Medicare Other | Admitting: Hematology & Oncology

## 2016-04-15 ENCOUNTER — Ambulatory Visit: Payer: Medicare Other | Admitting: Nurse Practitioner

## 2016-04-17 ENCOUNTER — Encounter (HOSPITAL_COMMUNITY): Payer: Medicare Other

## 2016-04-17 ENCOUNTER — Encounter (HOSPITAL_COMMUNITY): Payer: Medicare Other | Attending: Hematology & Oncology | Admitting: Oncology

## 2016-04-17 ENCOUNTER — Encounter (HOSPITAL_COMMUNITY): Payer: Self-pay | Admitting: Lab

## 2016-04-17 VITALS — BP 156/83 | HR 75 | Temp 97.8°F | Resp 18 | Wt 120.7 lb

## 2016-04-17 DIAGNOSIS — C20 Malignant neoplasm of rectum: Secondary | ICD-10-CM

## 2016-04-17 LAB — CBC WITH DIFFERENTIAL/PLATELET
Basophils Absolute: 0 10*3/uL (ref 0.0–0.1)
Basophils Relative: 0 %
EOS ABS: 0.1 10*3/uL (ref 0.0–0.7)
EOS PCT: 2 %
HCT: 32.2 % — ABNORMAL LOW (ref 36.0–46.0)
Hemoglobin: 10.9 g/dL — ABNORMAL LOW (ref 12.0–15.0)
LYMPHS ABS: 0.5 10*3/uL — AB (ref 0.7–4.0)
LYMPHS PCT: 11 %
MCH: 32.6 pg (ref 26.0–34.0)
MCHC: 33.9 g/dL (ref 30.0–36.0)
MCV: 96.4 fL (ref 78.0–100.0)
MONOS PCT: 5 %
Monocytes Absolute: 0.2 10*3/uL (ref 0.1–1.0)
Neutro Abs: 3.8 10*3/uL (ref 1.7–7.7)
Neutrophils Relative %: 82 %
PLATELETS: 202 10*3/uL (ref 150–400)
RBC: 3.34 MIL/uL — ABNORMAL LOW (ref 3.87–5.11)
RDW: 17.2 % — ABNORMAL HIGH (ref 11.5–15.5)
WBC: 4.6 10*3/uL (ref 4.0–10.5)

## 2016-04-17 LAB — COMPREHENSIVE METABOLIC PANEL
ALK PHOS: 60 U/L (ref 38–126)
ALT: 10 U/L — ABNORMAL LOW (ref 14–54)
ANION GAP: 7 (ref 5–15)
AST: 15 U/L (ref 15–41)
Albumin: 3.8 g/dL (ref 3.5–5.0)
BUN: 22 mg/dL — ABNORMAL HIGH (ref 6–20)
CALCIUM: 8.6 mg/dL — AB (ref 8.9–10.3)
CHLORIDE: 107 mmol/L (ref 101–111)
CO2: 26 mmol/L (ref 22–32)
CREATININE: 1.53 mg/dL — AB (ref 0.44–1.00)
GFR, EST AFRICAN AMERICAN: 36 mL/min — AB (ref >60)
GFR, EST NON AFRICAN AMERICAN: 31 mL/min — AB (ref >60)
Glucose, Bld: 102 mg/dL — ABNORMAL HIGH (ref 65–99)
Potassium: 3.2 mmol/L — ABNORMAL LOW (ref 3.5–5.1)
SODIUM: 140 mmol/L (ref 135–145)
Total Bilirubin: 0.4 mg/dL (ref 0.3–1.2)
Total Protein: 7.1 g/dL (ref 6.5–8.1)

## 2016-04-17 NOTE — Progress Notes (Signed)
Referral sent to Dr Marcello Moores @[facility] .   appt 6/12 at 11. Patient aware

## 2016-04-17 NOTE — Assessment & Plan Note (Addendum)
Stage IIA (T3N0M0) rectal adenocarcinoma, S/P resection of rectal mass by Dr. Marcello Moores on 12/14/2015, now S/P concomitant chemoradiation finishing on 04/02/2016.  Oncology history is updated.  Labs today: CBC diff, CMET.  She does have a hypokalemia that was noted so we will confirm that this is improved or resolved. She admits that she is only taking potassium chloride once daily.  She reports that she does not have follow-up appointment with Dr. Marcello Moores for surgical recommendations. We'll refer the patient back to general surgery as a result. I sent Dr. Marcello Moores a message as well. I suspect the patient will need reimaging and I'll be glad to help Dr. Marcello Moores in that regard in the future. Hopefully imaging can be performed here at [facility] to prevent excessive travel for the patient and hardship.  We'll tenably see her back in approximately 4 weeks for follow-up. This appointment can be manipulated according to general surgery recommendations.

## 2016-04-17 NOTE — Progress Notes (Signed)
[name], MD [place] [address]  Rectal adenocarcinoma [facility]) - Plan: CBC with Differential, Comprehensive metabolic panel, CBC with Differential, Comprehensive metabolic panel  CURRENT THERAPY: S/P concomitant chemoradiation finishing on 04/02/2016.  Waiting on further surgical recommendation(s).  INTERVAL HISTORY: Meghan Anderson 80 y.o. female returns for followup of Stage IIA (T3N0M0) rectal adenocarcinoma, S/P resection of rectal mass by Dr. Marcello Moores on 12/14/2015, now S/P concomitant chemoradiation finishing on 04/02/2016.    Rectal adenocarcinoma ([place])   11/01/2015 Procedure Colonoscopy by Dr. [name]   11/01/2015 Pathology Results Rectum, biopsy, mass - FRAGMENTS OF TUBULAR ADENOMA WITH HIGH GRADE GLANDULAR DYSPLASIA.   11/07/2015 Imaging CT abd/pelvis- Irregular annular wall thickening in the mid rectum with associated rectal intussusception, presumably representing a primary rectal neoplasm. Borderline prominent upper presacral lymph node, possibly metastatic.   12/14/2015 Definitive Surgery Rectum, resection, rectal mass by Dr. [name]   02/15/4258 Pathology Results Rectum, resection, rectal mass MODERATELY DIFFERENTIATED RECTAL ADENOCARCINOMA (4.3 CM) THE TUMOR INVADES THROUGH THE MUSCULARIS PROPRIA INTO PERICOLORECTAL TISSUE MARGINS OF RESECTION IS POSITIVE   12/14/2015 Pathology Results There is a very low probability that microsatellite instability (MSI) is present.   01/15/2016 Procedure Colonoscopy by Dr. [name]   01/15/2016 Pathology Results 1. Colon, polyp(s), descending - TUBULAR ADENOMA. NO HIGH GRADE DYSPLASIA OR MALIGNANCY IDENTIFIED. 2. Colon, polyp(s), splenic flexure - TUBULAR ADENOMA. NO HIGH GRADE DYSPLASIA OR MALIGNANCY IDENTIFIED. 3. Colon, polyp(s), ascending - TUBULAR ADENO   01/24/2016 Imaging CT abd/pelvis- Nonspecific mild residual rectal wall thickening with associated mild perirectal fat stranding posteriorly in the mid to upper rectum  (8-9 cm superior to the anal verge), which could represent postsurgical changes and/or residual tumor.    02/20/2016 - 04/02/2016 Chemotherapy 5FU CI with XRT   02/20/2016 - 04/02/2016 Radiation Therapy Dr. Pablo Ledger   03/08/2016 Treatment Plan Change 5 FU dose reduction by 20% beginning on cycle #3.   04/02/2016 Procedure PICC removed.    She has completed concomitant chemoradiation with 5FU CI.  She tolerated treatment well with a single dose reduction on cycle #3 with a 20% dose reduction in 5FU.  She denies any complaints today. Her weight is down some relatively stable over the last few months. She notes that her appetite is improving. She denies any active complaints. She is excited to have completed chemotherapy and radiation.  Discussion with the patient regarding Gen. surgery referral went well. She is already established with Dr. Marcello Moores and we will get her back to see Gen. Surgery for the next part in her cancer care and/or recommendation(s).  Review of Systems  Constitutional: Negative.   HENT: Negative.   Eyes: Negative.   Respiratory: Negative.   Cardiovascular: Negative.   Gastrointestinal: Negative.   Genitourinary: Negative.   Musculoskeletal: Negative.   Skin: Negative.   Neurological: Negative.   Endo/Heme/Allergies: Negative.   Psychiatric/Behavioral: Negative.     Past Medical History  Diagnosis Date  . Hypertension   . Hyperlipidemia   . Chronic bronchitis ([place])   . Thyroid disease   . GERD (gastroesophageal reflux disease)   . Arthritis   . Murmur, heart   . Cataract   . Hypothyroidism   . Complication of anesthesia   . PONV (postoperative nausea and vomiting)   . COPD (chronic obstructive pulmonary disease) ([place])   . Rectal adenocarcinoma ([place])   . Chronic renal disease, stage 3, moderately decreased glomerular filtration rate (GFR) between 30-59 mL/min/1.73 square meter  02/21/2016    Past Surgical History  Procedure Laterality Date  . Left ear    .  Abdominal hysterectomy      complete  . Colonoscopy N/A 11/01/2015    RMR: Large fungating rectal mass precluded colonoscopy. Status post biopsy.   . Transanal endoscopic microsurgery N/A 12/14/2015    Procedure: TRANSANAL ENDOSCOPIC MICROSURGERY OF RECTAL POLYP;  Surgeon: [name], MD;  Location: [facility] ORS;  Service: General;  Laterality: N/A;  . Colonoscopy N/A 01/15/2016    Procedure: COLONOSCOPY;  Surgeon: [name], MD;  Location: [facility] ENDO SUITE;  Service: Endoscopy;  Laterality: N/A;  130     Family History  Problem Relation Age of Onset  . Kidney disease Mother     nephrectomy x 1  . COPD Brother   . Emphysema Brother   . Asthma Brother   . Cancer Brother 18    brain  . Colon cancer Neg Hx     Social History   Social History  . Marital Status: Widowed    Spouse Name: N/A  . Number of Children: N/A  . Years of Education: N/A   Social History Main Topics  . Smoking status: Former Smoker    Quit date: 11/11/2010  . Smokeless tobacco: Never Used  . Alcohol Use: No  . Drug Use: No  . Sexual Activity: Not on file   Other Topics Concern  . Not on file   Social History Narrative     PHYSICAL EXAMINATION  ECOG PERFORMANCE STATUS: 1 - Symptomatic but completely ambulatory  Filed Vitals:   04/17/16 0914  BP: 156/83  Pulse: 75  Temp: 97.8 F (36.6 C)  Resp: 18    GENERAL:alert, no distress, well nourished, well developed, comfortable, cooperative, smiling and accompanied by her son. SKIN: skin color, texture, turgor are normal, no rashes or significant lesions, dark skin HEAD: Normocephalic, No masses, lesions, tenderness or abnormalities EYES: normal, EOMI, Conjunctiva are pink and non-injected EARS: External ears normal OROPHARYNX:lips, buccal mucosa, and tongue normal and mucous membranes are moist  NECK: supple, trachea midline LYMPH:  no palpable lymphadenopathy BREAST:not examined LUNGS: positive findings: wheezing  right mid posterior, right  lower posterior and otherwise clear to auscultation HEART: regular rate & rhythm, no murmurs, no gallops, S1 normal and S2 normal ABDOMEN:abdomen soft, non-tender, obese and normal bowel sounds BACK: Back symmetric, no curvature. EXTREMITIES:less then 2 second capillary refill, no joint deformities, effusion, or inflammation, no skin discoloration, no cyanosis  NEURO: alert & oriented x 3 with fluent speech, no focal motor/sensory deficits, gait normal   LABORATORY DATA: CBC    Component Value Date/Time   WBC 4.6 04/17/2016 0952   WBC 5.1 11/07/2015 1255   RBC 3.34* 04/17/2016 0952   RBC 3.79 11/07/2015 1255   HGB 10.9* 04/17/2016 0952   HCT 32.2* 04/17/2016 0952   HCT 33.1* 11/07/2015 1255   PLT 202 04/17/2016 0952   PLT 248 11/07/2015 1255   MCV 96.4 04/17/2016 0952   MCV 87 11/07/2015 1255   MCH 32.6 04/17/2016 0952   MCH 29.3 11/07/2015 1255   MCHC 33.9 04/17/2016 0952   MCHC 33.5 11/07/2015 1255   RDW 17.2* 04/17/2016 0952   RDW 15.6* 11/07/2015 1255   LYMPHSABS 0.5* 04/17/2016 0952   LYMPHSABS 0.8 11/07/2015 1255   MONOABS 0.2 04/17/2016 0952   EOSABS 0.1 04/17/2016 0952   EOSABS 0.1 11/07/2015 1255   BASOSABS 0.0 04/17/2016 0952   BASOSABS 0.0 11/07/2015 1255  Chemistry      Component Value Date/Time   NA 140 04/17/2016 0952   NA 140 11/07/2015 1255   K 3.2* 04/17/2016 0952   CL 107 04/17/2016 0952   CO2 26 04/17/2016 0952   BUN 22* 04/17/2016 0952   BUN 14 11/07/2015 1255   CREATININE 1.53* 04/17/2016 0952      Component Value Date/Time   CALCIUM 8.6* 04/17/2016 0952   ALKPHOS 60 04/17/2016 0952   AST 15 04/17/2016 0952   ALT 10* 04/17/2016 0952   BILITOT 0.4 04/17/2016 0952   BILITOT 0.2 12/29/2014 1155        PENDING LABS:   RADIOGRAPHIC STUDIES:  No results found.   PATHOLOGY:    ASSESSMENT AND PLAN:  Rectal adenocarcinoma ([facility]) Stage IIA (T3N0M0) rectal adenocarcinoma, S/P resection of rectal mass by Dr. Marcello Moores on 12/14/2015,  now S/P concomitant chemoradiation finishing on 04/02/2016.  Oncology history is updated.  Labs today: CBC diff, CMET.  She does have a hypokalemia that was noted so we will confirm that this is improved or resolved. She admits that she is only taking potassium chloride once daily.  She reports that she does not have follow-up appointment with Dr. Marcello Moores for surgical recommendations. We'll refer the patient back to general surgery as a result. I sent Dr. Marcello Moores a message as well. I suspect the patient will need reimaging and I'll be glad to help Dr. Marcello Moores in that regard in the future. Hopefully imaging can be performed here at [facility] to prevent excessive travel for the patient and hardship.  We'll tenably see her back in approximately 4 weeks for follow-up. This appointment can be manipulated according to general surgery recommendations.    ORDERS PLACED FOR THIS ENCOUNTER: Orders Placed This Encounter  Procedures  . CBC with Differential  . Comprehensive metabolic panel    MEDICATIONS PRESCRIBED THIS ENCOUNTER: No orders of the defined types were placed in this encounter.    THERAPY PLAN:  We will refer the patient back to Dr. [name] for surgical recommendation(s).    All questions were answered. The patient knows to call the clinic with any problems, questions or concerns. We can certainly see the patient much sooner if necessary.  Patient and plan discussed with Dr. Ancil Linsey and she is in agreement with the aforementioned.   This note is electronically signed by: Robynn Pane, PA-C 04/17/2016 11:15 AM

## 2016-04-17 NOTE — Patient Instructions (Addendum)
[place] at [facility] Discharge Instructions  RECOMMENDATIONS MADE BY THE CONSULTANT AND ANY TEST RESULTS WILL BE SENT TO YOUR REFERRING PHYSICIAN.  Labs today (CBC diff/CMP) - go to the lab  Refer back to [facility] Surgeon (GI)  Return in 4 weeks follow up with Dr. Whitney Muse  05/15/16 @ 0810   Thank you for choosing [place] at [facility] to provide your oncology and hematology care.  To afford each patient quality time with our provider, please arrive at least 15 minutes before your scheduled appointment time.   Beginning January 23rd 2017 lab work for the [facility] will be done in the  Main lab at [facility] on 1st floor. If you have a lab appointment with the [facility] please come in thru the  Main Entrance and check in at the main information desk  You need to re-schedule your appointment should you arrive 10 or more minutes late.  We strive to give you quality time with our providers, and arriving late affects you and other patients whose appointments are after yours.  Also, if you no show three or more times for appointments you may be dismissed from the clinic at the providers discretion.     Again, thank you for choosing [facility].  Our hope is that these requests will decrease the amount of time that you wait before being seen by our physicians.       _____________________________________________________________  Should you have questions after your visit to [facility], please contact our office at [phone] between the hours of 8:30 a.m. and 4:30 p.m.  Voicemails left after 4:30 p.m. will not be returned until the following business day.  For prescription refill requests, have your pharmacy contact our office.         Resources For Cancer Patients and their Caregivers ? American Cancer Society: Can assist with transportation, wigs, general needs, runs Look Good Feel  Better.        3392858429 ? Cancer Care: Provides financial assistance, online support groups, medication/co-pay assistance.  1-800-813-HOPE (351)748-1890) ? [place] Assists [place] cancer patients and their families through emotional , educational and financial support.  (707) 125-4918 ? [place] DSS Where to apply for food stamps, Medicaid and utility assistance. (785)799-3428 ? [employer]: Transportation to medical appointments. 973-017-9144 ? Social Security Administration: May apply for disability if have a Stage IV cancer. 678-547-1322 609-730-7449 ? [employer], Disability and Transit Services: Assists with nutrition, care and transit needs. [place] Support Programs: @10RELATIVEDAYS @ > Cancer Support Group  2nd Tuesday of the month 1pm-2pm, Journey Room  > Creative Journey  3rd Tuesday of the month 1130am-1pm, Journey Room  > Look Good Feel Better  1st Wednesday of the month 10am-12 noon, Journey Room (Call [place] to register [phone])

## 2016-04-18 ENCOUNTER — Other Ambulatory Visit: Payer: Self-pay | Admitting: Family Medicine

## 2016-04-21 DIAGNOSIS — R0602 Shortness of breath: Secondary | ICD-10-CM | POA: Diagnosis not present

## 2016-04-21 DIAGNOSIS — R05 Cough: Secondary | ICD-10-CM | POA: Diagnosis not present

## 2016-04-21 DIAGNOSIS — J40 Bronchitis, not specified as acute or chronic: Secondary | ICD-10-CM | POA: Diagnosis not present

## 2016-04-29 ENCOUNTER — Telehealth: Payer: Self-pay | Admitting: Family Medicine

## 2016-05-03 ENCOUNTER — Other Ambulatory Visit: Payer: Self-pay | Admitting: General Surgery

## 2016-05-03 DIAGNOSIS — C20 Malignant neoplasm of rectum: Secondary | ICD-10-CM | POA: Diagnosis not present

## 2016-05-06 DIAGNOSIS — J449 Chronic obstructive pulmonary disease, unspecified: Secondary | ICD-10-CM | POA: Diagnosis not present

## 2016-05-06 DIAGNOSIS — R0600 Dyspnea, unspecified: Secondary | ICD-10-CM | POA: Diagnosis not present

## 2016-05-15 ENCOUNTER — Ambulatory Visit (HOSPITAL_COMMUNITY): Payer: Medicare Other | Admitting: Hematology & Oncology

## 2016-05-17 ENCOUNTER — Ambulatory Visit (HOSPITAL_COMMUNITY)
Admission: RE | Admit: 2016-05-17 | Discharge: 2016-05-17 | Disposition: A | Discharge status: Home / Self Care | Payer: Medicare Other | Source: Ambulatory Visit | Attending: General Surgery | Admitting: General Surgery

## 2016-05-17 ENCOUNTER — Encounter (HOSPITAL_COMMUNITY): Payer: Self-pay | Admitting: *Deleted

## 2016-05-17 ENCOUNTER — Encounter (HOSPITAL_COMMUNITY)
Admission: RE | Disposition: A | Discharge status: Home / Self Care | Payer: Self-pay | Source: Ambulatory Visit | Attending: General Surgery

## 2016-05-17 DIAGNOSIS — Z923 Personal history of irradiation: Secondary | ICD-10-CM | POA: Diagnosis not present

## 2016-05-17 DIAGNOSIS — Z980 Intestinal bypass and anastomosis status: Secondary | ICD-10-CM | POA: Insufficient documentation

## 2016-05-17 DIAGNOSIS — Z87891 Personal history of nicotine dependence: Secondary | ICD-10-CM | POA: Insufficient documentation

## 2016-05-17 DIAGNOSIS — J449 Chronic obstructive pulmonary disease, unspecified: Secondary | ICD-10-CM | POA: Insufficient documentation

## 2016-05-17 DIAGNOSIS — K624 Stenosis of anus and rectum: Secondary | ICD-10-CM | POA: Diagnosis not present

## 2016-05-17 DIAGNOSIS — E039 Hypothyroidism, unspecified: Secondary | ICD-10-CM | POA: Diagnosis not present

## 2016-05-17 DIAGNOSIS — Z79899 Other long term (current) drug therapy: Secondary | ICD-10-CM | POA: Diagnosis not present

## 2016-05-17 DIAGNOSIS — Z85048 Personal history of other malignant neoplasm of rectum, rectosigmoid junction, and anus: Secondary | ICD-10-CM | POA: Insufficient documentation

## 2016-05-17 DIAGNOSIS — I129 Hypertensive chronic kidney disease with stage 1 through stage 4 chronic kidney disease, or unspecified chronic kidney disease: Secondary | ICD-10-CM | POA: Diagnosis not present

## 2016-05-17 DIAGNOSIS — Z9221 Personal history of antineoplastic chemotherapy: Secondary | ICD-10-CM | POA: Diagnosis not present

## 2016-05-17 DIAGNOSIS — Z08 Encounter for follow-up examination after completed treatment for malignant neoplasm: Secondary | ICD-10-CM | POA: Diagnosis not present

## 2016-05-17 DIAGNOSIS — K219 Gastro-esophageal reflux disease without esophagitis: Secondary | ICD-10-CM | POA: Diagnosis not present

## 2016-05-17 DIAGNOSIS — E785 Hyperlipidemia, unspecified: Secondary | ICD-10-CM | POA: Diagnosis not present

## 2016-05-17 DIAGNOSIS — Z7951 Long term (current) use of inhaled steroids: Secondary | ICD-10-CM | POA: Insufficient documentation

## 2016-05-17 DIAGNOSIS — M1990 Unspecified osteoarthritis, unspecified site: Secondary | ICD-10-CM | POA: Insufficient documentation

## 2016-05-17 DIAGNOSIS — N183 Chronic kidney disease, stage 3 (moderate): Secondary | ICD-10-CM | POA: Insufficient documentation

## 2016-05-17 HISTORY — PX: FLEXIBLE SIGMOIDOSCOPY: SHX5431

## 2016-05-17 SURGERY — SIGMOIDOSCOPY, FLEXIBLE
Anesthesia: Moderate Sedation

## 2016-05-17 MED ORDER — SPOT INK MARKER SYRINGE KIT
PACK | SUBMUCOSAL | Status: DC | PRN
  Administered 2016-05-17: 3.5 mL via SUBMUCOSAL

## 2016-05-17 MED ORDER — MIDAZOLAM HCL 5 MG/ML IJ SOLN
INTRAMUSCULAR | Status: AC
  Filled 2016-05-17: qty 2

## 2016-05-17 MED ORDER — FENTANYL CITRATE (PF) 100 MCG/2ML IJ SOLN
INTRAMUSCULAR | Status: AC
  Filled 2016-05-17: qty 2

## 2016-05-17 MED ORDER — LACTATED RINGERS IV SOLN
INTRAVENOUS | Status: DC
  Administered 2016-05-17: 1000 mL via INTRAVENOUS

## 2016-05-17 MED ORDER — FENTANYL CITRATE (PF) 100 MCG/2ML IJ SOLN
INTRAMUSCULAR | Status: DC | PRN
  Administered 2016-05-17: 25 ug via INTRAVENOUS

## 2016-05-17 MED ORDER — FLEET ENEMA 7-19 GM/118ML RE ENEM
ENEMA | RECTAL | Status: AC
  Filled 2016-05-17: qty 1

## 2016-05-17 MED ORDER — SODIUM CHLORIDE 0.9 % IV SOLN
INTRAVENOUS | Status: DC
Start: 2016-05-17 — End: 2016-05-17

## 2016-05-17 MED ORDER — DIPHENHYDRAMINE HCL 50 MG/ML IJ SOLN
INTRAMUSCULAR | Status: AC
  Filled 2016-05-17: qty 1

## 2016-05-17 MED ORDER — MIDAZOLAM HCL 10 MG/2ML IJ SOLN
INTRAMUSCULAR | Status: DC | PRN
  Administered 2016-05-17: 2 mg via INTRAVENOUS

## 2016-05-17 MED ORDER — FLEET ENEMA 7-19 GM/118ML RE ENEM
1.0000 | ENEMA | Freq: Once | RECTAL | Status: AC
Start: 2016-05-17 — End: 2016-05-17
  Administered 2016-05-17: 1 via RECTAL

## 2016-05-17 MED ORDER — SPOT INK MARKER SYRINGE KIT
PACK | SUBMUCOSAL | Status: AC
  Filled 2016-05-17: qty 5

## 2016-05-17 NOTE — H&P (Signed)
80 y.o. F with rectal cancer s/p trans-anal excisional biopsy and ChemoRT treatment.  She has now completed this and is here today to evaluate for any mucosal recurrence.    Past Surgical History  Procedure Laterality Date  . Left ear    . Abdominal hysterectomy      complete  . Colonoscopy N/A 11/01/2015    RMR: Large fungating rectal mass precluded colonoscopy. Status post biopsy.   . Transanal endoscopic microsurgery N/A 12/14/2015    Procedure: TRANSANAL ENDOSCOPIC MICROSURGERY OF RECTAL POLYP;  Surgeon: [name], MD;  Location: [facility] ORS;  Service: General;  Laterality: N/A;  . Colonoscopy N/A 01/15/2016    Procedure: COLONOSCOPY;  Surgeon: [name], MD;  Location: [facility] END[facility] SUITE;  Service: Endoscopy;  Laterality: N/A;  130    Past Medical History  Diagnosis Date  . Hypertension   . Hyperlipidemia   . Chronic bronchitis ([place])   . Thyroid disease   . GERD (gastroesophageal reflux disease)   . Arthritis   . Murmur, heart   . Cataract   . Hypothyroidism   . Complication of anesthesia   . PONV (postoperative nausea and vomiting)   . COPD (chronic obstructive pulmonary disease) ([place])   . Rectal adenocarcinoma ([place])   . Chronic renal disease, stage 3, moderately decreased glomerular filtration rate (GFR) between 30-59 mL/min/1.73 square meter 02/21/2016   Social History   Social History  . Marital Status: Widowed    Spouse Name: N/A  . Number of Children: N/A  . Years of Education: N/A   Occupational History  . Not on file.   Social History Main Topics  . Smoking status: Former Smoker    Quit date: 11/11/2010  . Smokeless tobacco: Never Used  . Alcohol Use: No  . Drug Use: No  . Sexual Activity: Not on file   Other Topics Concern  . Not on file   Social History Narrative   Family History  Problem Relation Age of Onset  . Kidney disease Mother     nephrectomy x 1  . COPD Brother   . Emphysema Brother   . Asthma Brother   . Cancer Brother 30    brain   . Colon cancer Neg Hx    Current Meds  Medication Sig  . albuterol (PROVENTIL) (2.5 MG/3ML) 0.083% nebulizer solution NEBULIZE 1 VIAL EVERY 4 HOURS AS NEEDED (Patient taking differently: NEBULIZE 1 VIAL EVERY 4 HOURS AS NEEDED FOR SHORTNESS OF BREATHE.)  . amLODipine (NORVASC) 5 MG tablet Take 1 tablet (5 mg total) by mouth daily.  Marland Kitchen atorvastatin (LIPITOR) 80 MG tablet Take 1 tablet (80 mg total) by mouth daily. (Patient taking differently: Take 40 mg by mouth daily at 12 noon. )  . Fluticasone Furoate-Vilanterol (BREO ELLIPTA) 100-25 MCG/INH AEPB USE 1 PUFF ONCE DAILY  . levothyroxine (SYNTHROID, LEVOTHROID) 150 MCG tablet Take 1 tablet (150 mcg total) by mouth daily.  Marland Kitchen lisinopril-hydrochlorothiazide (PRINZIDE,ZESTORETIC) 20-12.5 MG tablet TAKE TWO TABLETS BY MOUTH DAILY  . omeprazole (PRILOSEC) 40 MG capsule TAKE ONE (1) CAPSULE EACH DAY  . potassium chloride SA (K-DUR,KLOR-CON) 20 MEQ tablet TAKE ONE (1) TABLET EACH DAY  . tiotropium (SPIRIVA HANDIHALER) 18 MCG inhalation capsule Place 1 capsule (18 mcg total) into inhaler and inhale daily.   Allergies  Allergen Reactions  . Moxifloxacin Hcl In Nacl Rash  . Levaquin [Levofloxacin In D5w]     "too strong couldn't take them."  . Prednisone Itching and Rash  . Quinolones Rash  Review of Systems - General ROS: negative for - chills, fatigue or fever Respiratory ROS: no cough, shortness of breath, or wheezing Cardiovascular ROS: no chest pain or dyspnea on exertion Gastrointestinal ROS: no abdominal pain, change in bowel habits, or black or bloody stools Genito-Urinary ROS: no dysuria, trouble voiding, or hematuria   BP 167/82 mmHg  Pulse 71  Temp(Src) 98 F (36.7 C) (Oral)  Resp 21  Ht 5\' 4"  (1.626 m)  Wt 54.432 kg (120 lb)  BMI 20.59 kg/m2  SpO2 95%  Physical Exam  Constitutional: She is oriented to person, place, and time. She appears well-developed and well-nourished.  HENT:  Head: Normocephalic and atraumatic.  Eyes:  Conjunctivae and EOM are normal. Pupils are equal, round, and reactive to light. No scleral icterus.  Neck: Normal range of motion. Neck supple.  Cardiovascular: Normal rate and regular rhythm.   Pulmonary/Chest: Effort normal and breath sounds normal. No respiratory distress.  Abdominal: Soft.  Musculoskeletal: Normal range of motion.  Neurological: She is alert and oriented to person, place, and time.  Skin: Skin is warm and dry.   Assesment: h/o rectal cancer  Plan: We will do a flexible proctoscopy to evaluate for any tumor regression or recurrence.  We will perform a biopsy if anything abnormal is seen.  Risks include bleeding and perforation and missing tumor not in the lumen of the bowel.  I believe she understands this and agrees to proceed.    Rosario Adie, MD  [facility]

## 2016-05-17 NOTE — Op Note (Signed)
[facility] Patient Name: Meghan Anderson Procedure Date: 05/17/2016 MRN: [id number] Attending MD: [name] , MD Date of Birth: June 06, 1936 CSN: [id number] Age: 80 Admit Type: Outpatient Procedure:                Flexible Sigmoidoscopy Indications:              High risk colon cancer surveillance: Personal                            history of rectal cancer. Eval for recurrence Providers:                [name], MD, Laverta Baltimore, RN, Elspeth Cho, Technician Referring MD:              Medicines:                Fentanyl 25 micrograms IV, Midazolam 2 mg IV Complications:            No immediate complications. Estimated Blood Loss:     Estimated blood loss was minimal. Procedure:                Pre-Anesthesia Assessment:                           - Prior to the procedure, a History and Physical                            was performed, and patient medications, allergies                            and sensitivities were reviewed. The patient's                            tolerance of previous anesthesia was reviewed.                           - The risks and benefits of the procedure and the                            sedation options and risks were discussed with the                            patient. All questions were answered and informed                            consent was obtained.                           - Patient identification and proposed procedure                            were verified prior to the procedure by the  physician, the nurse and the technician. The                            procedure was verified in the procedure room.                           - ASA Grade Assessment: III - A patient with severe                            systemic disease.                           - The anesthesia plan was to use moderate                            sedation/analgesia (conscious sedation).                         - Immediately prior to administration of                            medications, the patient was re-assessed for                            adequacy to receive sedatives.                           - Sedation was administered by an endoscopy nurse.                            The sedation level attained was minimal.                           After obtaining informed consent, the scope was                            passed under direct vision. The [id number]                            scope was introduced through the anus and advanced                            to the the sigmoid colon. The flexible                            sigmoidoscopy was accomplished with ease. The                            patient tolerated the procedure well. The quality                            of the bowel preparation was good. Scope In: Scope Out: Findings:      There was evidence of a prior surgical anastomosis in the mid rectum.       This was patent and was  characterized by erosion and mild stenosis. The       anastomosis was traversed. Four biopsies were obtained in the rectum       with cold forceps for histology.      The perianal and digital rectal examinations were normal. Pertinent       negatives include normal sphincter tone. Impression:               - Patent surgical anastomosis, characterized by                            erosion and mild stenosis.                           - Four biopsies were obtained in the rectum at the                            resection scar for evaluation of response to                            treatment. Moderate Sedation:      Moderate (conscious) sedation was administered by the endoscopy nurse       and supervised by the endoscopist. The following parameters were       monitored: oxygen saturation, heart rate, blood pressure, and response       to care. Recommendation:           - Await pathology results. Procedure Code(s):         --- Professional ---                           [phone], Sigmoidoscopy, flexible; with biopsy, single                            or multiple Diagnosis Code(s):        --- Professional ---                           Z85.048, Personal history of other malignant                            neoplasm of rectum, rectosigmoid junction, and anus                           Z98.0, Intestinal bypass and anastomosis status CPT copyright 2016 American Medical Association. All rights reserved. The codes documented in this report are preliminary and upon coder review may  be revised to meet current compliance requirements. [name], MD [name], MD 0000000 1:20:41 PM This report has been signed electronically. Number of Addenda: 0

## 2016-05-17 NOTE — Discharge Instructions (Addendum)
Post Colonoscopy Instructions ° °1. DIET: Follow a light bland diet the first 24 hours after arrival home, such as soup, liquids, crackers, etc.  Be sure to include lots of fluids daily.  Avoid fast food or heavy meals as your are more likely to get nauseated.   °2. You may have some mild rectal bleeding for the first few days after the procedure.  This should get less and less with time.  Resume any blood thinners 2 days after your procedure unless directed otherwise by your physician. °3. Take your usually prescribed home medications unless otherwise directed. °a. If you have any pain, it is helpful to get up and walk around, as it is usually from excess gas. °b. If this is not helpful, you can take an over-the-counter pain medication.  Choose one of the following that works best for you: °i. Naproxen (Aleve, etc)  Two 220mg tabs twice a day °ii. Ibuprofen (Advil, etc) Three 200mg tabs four times a day (every meal & bedtime) °iii. If you still have pain after using one of these, please call the office °4. It is normal to not have a bowel movement for 2-3 days after colonoscopy.   ° °5. ACTIVITIES as tolerated:   °6. You may resume regular (light) daily activities beginning the next day--such as daily self-care, walking, climbing stairs--gradually increasing activities as tolerated.  ° ° °WHEN TO CALL US [phone]: °1. Fever over 101.5 F (38.5 C)  °2. Severe abdominal or chest pain  °3. Large amount of rectal bleeding, passing multiple blood clots  °4. Dizziness or shortness of breath °5. Increasing nausea or vomiting ° ° The clinic staff is available to answer your questions during regular business hours (8:30am-5pm).  Please don’t hesitate to call and ask to speak to one of our nurses for clinical concerns.  ° If you have a medical emergency, go to the nearest emergency room or call 911. ° A surgeon from [facility] is always on call at the hospitals ° ° °[facility] °[address], [address], [place], [place]  [address] ? °MAIN: [phone] ? TOLL FREE: [phone] ?  °FAX [phone] °[email or web address] ° ° °

## 2016-05-22 ENCOUNTER — Emergency Department (HOSPITAL_COMMUNITY): Payer: Medicare Other

## 2016-05-22 ENCOUNTER — Encounter (HOSPITAL_COMMUNITY): Payer: Self-pay | Admitting: *Deleted

## 2016-05-22 ENCOUNTER — Emergency Department (HOSPITAL_COMMUNITY)
Admission: EM | Admit: 2016-05-22 | Discharge: 2016-05-23 | Disposition: A | Discharge status: Home / Self Care | Payer: Medicare Other | Attending: Emergency Medicine | Admitting: Emergency Medicine

## 2016-05-22 ENCOUNTER — Other Ambulatory Visit: Payer: Self-pay | Admitting: Family Medicine

## 2016-05-22 DIAGNOSIS — M1990 Unspecified osteoarthritis, unspecified site: Secondary | ICD-10-CM | POA: Diagnosis not present

## 2016-05-22 DIAGNOSIS — Z7951 Long term (current) use of inhaled steroids: Secondary | ICD-10-CM | POA: Diagnosis not present

## 2016-05-22 DIAGNOSIS — E039 Hypothyroidism, unspecified: Secondary | ICD-10-CM | POA: Insufficient documentation

## 2016-05-22 DIAGNOSIS — J449 Chronic obstructive pulmonary disease, unspecified: Secondary | ICD-10-CM | POA: Insufficient documentation

## 2016-05-22 DIAGNOSIS — Z79899 Other long term (current) drug therapy: Secondary | ICD-10-CM | POA: Insufficient documentation

## 2016-05-22 DIAGNOSIS — R1011 Right upper quadrant pain: Secondary | ICD-10-CM | POA: Insufficient documentation

## 2016-05-22 DIAGNOSIS — J9811 Atelectasis: Secondary | ICD-10-CM | POA: Diagnosis not present

## 2016-05-22 DIAGNOSIS — R112 Nausea with vomiting, unspecified: Secondary | ICD-10-CM | POA: Diagnosis not present

## 2016-05-22 DIAGNOSIS — R109 Unspecified abdominal pain: Secondary | ICD-10-CM | POA: Diagnosis not present

## 2016-05-22 DIAGNOSIS — R1031 Right lower quadrant pain: Secondary | ICD-10-CM | POA: Diagnosis not present

## 2016-05-22 DIAGNOSIS — E785 Hyperlipidemia, unspecified: Secondary | ICD-10-CM | POA: Diagnosis not present

## 2016-05-22 DIAGNOSIS — N183 Chronic kidney disease, stage 3 (moderate): Secondary | ICD-10-CM | POA: Diagnosis not present

## 2016-05-22 DIAGNOSIS — K5730 Diverticulosis of large intestine without perforation or abscess without bleeding: Secondary | ICD-10-CM | POA: Diagnosis not present

## 2016-05-22 DIAGNOSIS — I129 Hypertensive chronic kidney disease with stage 1 through stage 4 chronic kidney disease, or unspecified chronic kidney disease: Secondary | ICD-10-CM | POA: Diagnosis not present

## 2016-05-22 LAB — COMPREHENSIVE METABOLIC PANEL
ALBUMIN: 3.6 g/dL (ref 3.5–5.0)
ALK PHOS: 61 U/L (ref 38–126)
ALT: 10 U/L — ABNORMAL LOW (ref 14–54)
ANION GAP: 5 (ref 5–15)
AST: 16 U/L (ref 15–41)
BUN: 16 mg/dL (ref 6–20)
CALCIUM: 8.4 mg/dL — AB (ref 8.9–10.3)
CO2: 27 mmol/L (ref 22–32)
Chloride: 107 mmol/L (ref 101–111)
Creatinine, Ser: 1.62 mg/dL — ABNORMAL HIGH (ref 0.44–1.00)
GFR calc Af Amer: 34 mL/min — ABNORMAL LOW (ref >60)
GFR calc non Af Amer: 29 mL/min — ABNORMAL LOW (ref >60)
GLUCOSE: 114 mg/dL — AB (ref 65–99)
Potassium: 3.7 mmol/L (ref 3.5–5.1)
SODIUM: 139 mmol/L (ref 135–145)
Total Bilirubin: 0.5 mg/dL (ref 0.3–1.2)
Total Protein: 7 g/dL (ref 6.5–8.1)

## 2016-05-22 LAB — URINALYSIS, ROUTINE W REFLEX MICROSCOPIC
BILIRUBIN URINE: NEGATIVE
Glucose, UA: NEGATIVE mg/dL
Ketones, ur: NEGATIVE mg/dL
LEUKOCYTES UA: NEGATIVE
NITRITE: NEGATIVE
PH: 6 (ref 5.0–8.0)
Protein, ur: >300 mg/dL — AB
SPECIFIC GRAVITY, URINE: 1.025 (ref 1.005–1.030)

## 2016-05-22 LAB — CBC WITH DIFFERENTIAL/PLATELET
BASOS PCT: 0 %
Basophils Absolute: 0 10*3/uL (ref 0.0–0.1)
Eosinophils Absolute: 0.1 10*3/uL (ref 0.0–0.7)
Eosinophils Relative: 2 %
HEMATOCRIT: 34 % — AB (ref 36.0–46.0)
HEMOGLOBIN: 10.9 g/dL — AB (ref 12.0–15.0)
LYMPHS ABS: 0.3 10*3/uL — AB (ref 0.7–4.0)
LYMPHS PCT: 5 %
MCH: 31.1 pg (ref 26.0–34.0)
MCHC: 32.1 g/dL (ref 30.0–36.0)
MCV: 97.1 fL (ref 78.0–100.0)
MONO ABS: 0.4 10*3/uL (ref 0.1–1.0)
MONOS PCT: 8 %
NEUTROS ABS: 4.8 10*3/uL (ref 1.7–7.7)
NEUTROS PCT: 85 %
Platelets: 221 10*3/uL (ref 150–400)
RBC: 3.5 MIL/uL — ABNORMAL LOW (ref 3.87–5.11)
RDW: 16.4 % — ABNORMAL HIGH (ref 11.5–15.5)
WBC: 5.6 10*3/uL (ref 4.0–10.5)

## 2016-05-22 LAB — URINE MICROSCOPIC-ADD ON

## 2016-05-22 LAB — LIPASE, BLOOD: Lipase: 30 U/L (ref 11–51)

## 2016-05-22 MED ORDER — HYDROMORPHONE HCL 1 MG/ML IJ SOLN
INTRAMUSCULAR | Status: DC
Start: 2016-05-22 — End: 2016-05-23
  Filled 2016-05-22: qty 1

## 2016-05-22 MED ORDER — DIATRIZOATE MEGLUMINE & SODIUM 66-10 % PO SOLN
ORAL | Status: AC
  Administered 2016-05-22: 22:00:00
  Filled 2016-05-22: qty 30

## 2016-05-22 MED ORDER — ONDANSETRON HCL 4 MG/2ML IJ SOLN
4.0000 mg | Freq: Once | INTRAMUSCULAR | Status: AC
  Administered 2016-05-22: 4 mg via INTRAVENOUS
  Filled 2016-05-22: qty 2

## 2016-05-22 MED ORDER — HYDROMORPHONE HCL 1 MG/ML IJ SOLN
1.0000 mg | Freq: Once | INTRAMUSCULAR | Status: AC
  Administered 2016-05-22: 1 mg via INTRAVENOUS
  Filled 2016-05-22: qty 1

## 2016-05-22 MED ORDER — HYDROMORPHONE HCL 1 MG/ML IJ SOLN
1.0000 mg | Freq: Once | INTRAMUSCULAR | Status: AC
  Administered 2016-05-22: 1 mg via INTRAVENOUS

## 2016-05-22 MED ORDER — SODIUM CHLORIDE 0.9 % IV BOLUS (SEPSIS)
1000.0000 mL | Freq: Once | INTRAVENOUS | Status: AC
  Administered 2016-05-22: 1000 mL via INTRAVENOUS

## 2016-05-22 MED ORDER — MORPHINE SULFATE (PF) 4 MG/ML IV SOLN
4.0000 mg | Freq: Once | INTRAVENOUS | Status: AC
  Administered 2016-05-22: 4 mg via INTRAVENOUS
  Filled 2016-05-22: qty 1

## 2016-05-22 NOTE — ED Provider Notes (Signed)
CSN: [id number]     Arrival date & time 05/22/16  2023 History  By signing my name below, I, Emmanuella Mensah, attest that this documentation has been prepared under the direction and in the presence of Nat Christen, MD. Electronically Signed: Judithann Sauger, ED Scribe. 05/22/2016. 9:34 PM.      Chief Complaint  Patient presents with  . Abdominal Pain   Patient is a 80 y.o. female presenting with abdominal pain. The history is provided by the patient. No language interpreter was used.  Abdominal Pain Pain location:  RUQ and RLQ Pain radiates to:  Chest Pain severity:  Moderate Onset quality:  Sudden Duration:  7 hours Timing:  Constant Progression:  Worsening Relieved by:  None tried Worsened by:  Nothing tried Ineffective treatments:  None tried Associated symptoms: chest pain, nausea and vomiting   Associated symptoms: no cough, no diarrhea, no fever and no shortness of breath   Risk factors: being elderly    HPI Comments: Meghan Anderson is a 80 y.o. female with a hx of HTN, HLD, GERD, COPD, and chronic renal disease who presents to the Emergency Department complaining of sudden onset of gradually worsening right lateral abdomen and right inferior chest pain onset 2 pm today. She reports associated one episode of vomiting and nausea. No alleviating factors noted. Pt has not tried any medications PTA. Her family reports that pt had a similar episode 3 weeks ago that lasted for approx one hour but resolved on its own. Pt had rectal surgery by Dr. Marcello Moores in February 2017. She states that she finished chemo for rectal cancer one month ago. She denies any diarrhea, constipation, SOB, or cough.   Past Medical History  Diagnosis Date  . Hypertension   . Hyperlipidemia   . Chronic bronchitis ([place])   . Thyroid disease   . GERD (gastroesophageal reflux disease)   . Arthritis   . Murmur, heart   . Cataract   . Hypothyroidism   . Complication of anesthesia   . PONV (postoperative  nausea and vomiting)   . COPD (chronic obstructive pulmonary disease) ([place])   . Rectal adenocarcinoma ([place])   . Chronic renal disease, stage 3, moderately decreased glomerular filtration rate (GFR) between 30-59 mL/min/1.73 square meter 02/21/2016   Past Surgical History  Procedure Laterality Date  . Left ear    . Abdominal hysterectomy      complete  . Colonoscopy N/A 11/01/2015    RMR: Large fungating rectal mass precluded colonoscopy. Status post biopsy.   . Transanal endoscopic microsurgery N/A 12/14/2015    Procedure: TRANSANAL ENDOSCOPIC MICROSURGERY OF RECTAL POLYP;  Surgeon: [name], MD;  Location: [facility];  Service: General;  Laterality: N/A;  . Colonoscopy N/A 01/15/2016    Procedure: COLONOSCOPY;  Surgeon: [name], MD;  Location: [facility];  Service: Endoscopy;  Laterality: N/A;  130   . Flexible sigmoidoscopy N/A 05/17/2016    Procedure: FLEXIBLE SIGMOIDOSCOPY;  Surgeon: [name], MD;  Location: [facility];  Service: Endoscopy;  Laterality: N/A;   Family History  Problem Relation Age of Onset  . Kidney disease Mother     nephrectomy x 1  . COPD Brother   . Emphysema Brother   . Asthma Brother   . Cancer Brother 76    brain  . Colon cancer Neg Hx    Social History  Substance Use Topics  . Smoking status: Former Smoker    Quit date: 11/11/2010  . Smokeless tobacco: Never Used  .  Alcohol Use: No   OB History    No data available     Review of Systems  Constitutional: Negative for fever.  Respiratory: Negative for cough and shortness of breath.   Cardiovascular: Positive for chest pain.  Gastrointestinal: Positive for nausea, vomiting and abdominal pain. Negative for diarrhea.  All other systems reviewed and are negative.     Allergies  Moxifloxacin hcl in nacl; Levaquin; Prednisone; and Quinolones  Home Medications   Prior to Admission medications   Medication Sig Start Date End Date Taking? Authorizing Provider  albuterol  (PROVENTIL) (2.5 MG/3ML) 0.083% nebulizer solution NEBULIZE 1 VIAL EVERY 4 HOURS AS NEEDED Patient taking differently: NEBULIZE 1 VIAL EVERY 4 HOURS AS NEEDED FOR SHORTNESS OF BREATHE. 07/18/15  Yes Fransisca Kaufmann Dettinger, MD  amLODipine (NORVASC) 5 MG tablet Take 1 tablet (5 mg total) by mouth daily. 06/29/15  Yes Fransisca Kaufmann Dettinger, MD  atorvastatin (LIPITOR) 80 MG tablet Take 1 tablet (80 mg total) by mouth daily. Patient taking differently: Take 40 mg by mouth daily at 12 noon.  06/30/15  Yes Fransisca Kaufmann Dettinger, MD  Fluticasone Furoate-Vilanterol (BREO ELLIPTA) 100-25 MCG/INH AEPB USE 1 PUFF ONCE DAILY 08/10/15  Yes Historical Provider, MD  levothyroxine (SYNTHROID, LEVOTHROID) 150 MCG tablet Take 1 tablet (150 mcg total) by mouth daily. 06/30/15  Yes Fransisca Kaufmann Dettinger, MD  lisinopril-hydrochlorothiazide (PRINZIDE,ZESTORETIC) 20-12.5 MG tablet TAKE TWO TABLETS BY MOUTH DAILY 05/22/16  Yes Fransisca Kaufmann Dettinger, MD  omeprazole (PRILOSEC) 40 MG capsule TAKE ONE (1) CAPSULE EACH DAY 12/29/15  Yes Fransisca Kaufmann Dettinger, MD  potassium chloride SA (K-DUR,KLOR-CON) 20 MEQ tablet TAKE ONE (1) TABLET EACH DAY 01/12/16  Yes Fransisca Kaufmann Dettinger, MD  tiotropium (SPIRIVA HANDIHALER) 18 MCG inhalation capsule Place 1 capsule (18 mcg total) into inhaler and inhale daily. 06/29/15  Yes Fransisca Kaufmann Dettinger, MD  fluticasone (FLONASE) 50 MCG/ACT nasal spray Place 2 sprays into both nostrils daily as needed. Patient not taking: Reported on 05/22/2016 07/29/14   Tammy Eckard, PHARMD  ondansetron (ZOFRAN) 8 MG tablet Take 1 tablet (8 mg total) by mouth every 4 (four) hours as needed. 05/23/16   Nat Christen, MD  oxyCODONE-acetaminophen (PERCOCET) 5-325 MG tablet Take 1-2 tablets by mouth every 4 (four) hours as needed. 05/23/16   Nat Christen, MD   BP 192/93 mmHg  Pulse 71  Temp(Src) 98.2 F (36.8 C) (Oral)  Resp 18  Ht 5\' 4"  (1.626 m)  Wt 120 lb (54.432 kg)  BMI 20.59 kg/m2  SpO2 94% Physical Exam  Constitutional: She is oriented to  person, place, and time. She appears well-developed and well-nourished.  Elderly  HENT:  Head: Normocephalic and atraumatic.  Eyes: Conjunctivae are normal.  Neck: Neck supple.  Cardiovascular: Normal rate and regular rhythm.   Pulmonary/Chest: Effort normal and breath sounds normal. She exhibits tenderness.  Right inferior chest tenderness  Abdominal: Soft. Bowel sounds are normal. There is tenderness.  Right lateral abdomen tenderness  Musculoskeletal: Normal range of motion.  Neurological: She is alert and oriented to person, place, and time.  Skin: Skin is warm and dry.  Psychiatric: She has a normal mood and affect. Her behavior is normal.  Nursing note and vitals reviewed.   ED Course  Procedures (including critical care time) DIAGNOSTIC STUDIES: Oxygen Saturation is 97% on RA, normal by my interpretation.    COORDINATION OF CARE: 9:29 PM- Pt advised of plan for treatment and pt agrees. Pt will receive CT scan for further evaluation. She will  also receive pain medication.    Labs Review Labs Reviewed  CBC WITH DIFFERENTIAL/PLATELET - Abnormal; Notable for the following:    RBC 3.50 (*)    Hemoglobin 10.9 (*)    HCT 34.0 (*)    RDW 16.4 (*)    Lymphs Abs 0.3 (*)    All other components within normal limits  COMPREHENSIVE METABOLIC PANEL - Abnormal; Notable for the following:    Glucose, Bld 114 (*)    Creatinine, Ser 1.62 (*)    Calcium 8.4 (*)    ALT 10 (*)    GFR calc non Af Amer 29 (*)    GFR calc Af Amer 34 (*)    All other components within normal limits  URINALYSIS, ROUTINE W REFLEX MICROSCOPIC (NOT AT [facility]) - Abnormal; Notable for the following:    Hgb urine dipstick SMALL (*)    Protein, ur >300 (*)    All other components within normal limits  URINE MICROSCOPIC-ADD ON - Abnormal; Notable for the following:    Squamous Epithelial / LPF 0-5 (*)    Bacteria, UA FEW (*)    Casts HYALINE CASTS (*)    All other components within normal limits  LIPASE, BLOOD     Imaging Review Ct Abdomen Pelvis Wo Contrast  05/23/2016  CLINICAL DATA:  Sudden onset right lateral abdominal and right inferior chest pain since 2 p.m. today. Nausea and vomiting. History of surgery for rectal cancer and degree 2017 with subsequent chemo therapy. EXAM: CT ABDOMEN AND PELVIS WITHOUT CONTRAST TECHNIQUE: Multidetector CT imaging of the abdomen and pelvis was performed following the standard protocol without IV contrast. COMPARISON:  CT pelvis 02/09/2016. CT abdomen and pelvis 11/07/2015. FINDINGS: Mild emphysematous changes and scattered fibrosis in the lung bases. Calcification in the mitral valve annulus. The unenhanced appearance of the liver, spleen, gallbladder, pancreas, adrenal glands, kidneys, inferior vena cava, and retroperitoneal lymph nodes is unremarkable. Diffuse calcification of the abdominal aorta without aneurysm. Stomach, small bowel, and colon are not abnormally distended. No free air or free fluid in the abdomen. Pelvis: Previous rectal lesion has been removed. Diverticulosis of the sigmoid colon without evidence of diverticulitis. No free or loculated pelvic fluid collections. No pelvic mass or lymphadenopathy. Bladder wall is not thickened. Degenerative changes in the spine. No destructive bone lesions. Sclerosis and contour deformity to the superior aspect of the left femoral head likely representing avascular necrosis. IMPRESSION: No acute process demonstrated in the abdomen or pelvis. No evidence of bowel obstruction or inflammation. Diverticulosis of the sigmoid colon without diverticulitis. Aortic atherosclerosis. Electronically Signed   By: Lucienne Capers M.D.   On: 05/23/2016 00:01   Dg Chest 2 View  05/22/2016  CLINICAL DATA:  Right upper quadrant abdominal pain with shortness of breath today. EXAM: CHEST  2 VIEW COMPARISON:  04/21/2016 FINDINGS: Shallow inspiration with atelectasis in the lung bases. Colonic interposition under the right hemidiaphragm.  Heart size and pulmonary vascularity are normal for technique. No focal airspace disease or consolidation in the lungs. No blunting of costophrenic angles. No pneumothorax. Degenerative changes in the spine and shoulders. Calcification of the aorta. IMPRESSION: Shallow inspiration with atelectasis in the lung bases. No focal consolidation. Aortic atherosclerosis. Electronically Signed   By: Lucienne Capers M.D.   On: 05/22/2016 22:33     Nat Christen, MD has personally reviewed and evaluated these images and lab results as part of my medical decision-making.   EKG Interpretation None      MDM  Final diagnoses:  Abdominal pain, unspecified abdominal location    Patient complains of right lateral abdominal pain in right inferior lateral chest pain. She is hypertensive but otherwise has normal vital signs. CT abdomen/pelvis and chest x-ray showed no acute abnormalities. White count normal. Urinalysis shows small amount of hemoglobin.. Could be a kidney stone. Discharge medications Percocet and Zofran 8 mg. Discussed with patient and her family.  I personally performed the services described in this documentation, which was scribed in my presence. The recorded information has been reviewed and is accurate.    Nat Christen, MD 05/23/16 [phone]

## 2016-05-22 NOTE — Telephone Encounter (Signed)
Last seen 09/2015

## 2016-05-22 NOTE — ED Notes (Addendum)
Pt c/o right side abdominal pain that started today at 1400; pt went to urgent care but was sent here for further testing; pt has been vomiting; pt's last chemo treatment was x 1 month ago for rectal/colon cancer

## 2016-05-23 DIAGNOSIS — R1031 Right lower quadrant pain: Secondary | ICD-10-CM | POA: Diagnosis not present

## 2016-05-23 MED ORDER — ONDANSETRON HCL 4 MG/2ML IJ SOLN
INTRAMUSCULAR | Status: AC
  Filled 2016-05-23: qty 2

## 2016-05-23 MED ORDER — ONDANSETRON HCL 8 MG PO TABS: 8.0000 mg | ORAL_TABLET | ORAL | Status: DC | PRN

## 2016-05-23 MED ORDER — MORPHINE SULFATE (PF) 4 MG/ML IV SOLN
4.0000 mg | Freq: Once | INTRAVENOUS | Status: AC
  Administered 2016-05-23: 4 mg via INTRAVENOUS
  Filled 2016-05-23: qty 1

## 2016-05-23 MED ORDER — ONDANSETRON HCL 4 MG/2ML IJ SOLN
4.0000 mg | Freq: Once | INTRAMUSCULAR | Status: AC
  Administered 2016-05-23: 4 mg via INTRAVENOUS

## 2016-05-23 MED ORDER — OXYCODONE-ACETAMINOPHEN 5-325 MG PO TABS: 1.0000 | ORAL_TABLET | ORAL | Status: DC | PRN

## 2016-05-23 NOTE — Discharge Instructions (Signed)
Tests show no life-threatening condition. Meds for pain and nausea.  Follow-up your primary care doctor.

## 2016-05-24 ENCOUNTER — Ambulatory Visit (HOSPITAL_COMMUNITY): Payer: Medicare Other | Admitting: Oncology

## 2016-05-24 NOTE — Assessment & Plan Note (Deleted)
Stage IIA (T3N0M0) rectal adenocarcinoma, S/P resection of rectal mass by Dr. Marcello Moores on 12/14/2015, now S/P concomitant chemoradiation finishing on 04/02/2016.  She has refused surgical intervention.    Oncology history is updated.  Labs today: CBC diff, CMET, CEA.  I personally reviewed and went over laboratory results with the patient.  The results are noted within this dictation.  She has seen Dr. [name] who performed a flexible sigmoidoscopy on 05/17/2016 with biopsies.  Those biopsies were negative for disease.  I personally reviewed and went over pathology results with the patient.  Through discussions with Dr. Marcello Moores, the patient has opted against surgical treatment.  As a result, she is here today to discuss options moving forward.  We discussed Xeloda as a possible treatment option for 4 months.  Dosing would be Capecitabine 1000- 1250 mg/m2 BID days 1-14 every 21 days x 4 months.

## 2016-05-24 NOTE — Progress Notes (Signed)
-  Late for appointment and therefore rescheduled-  ROS
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
## 2016-05-29 ENCOUNTER — Encounter (HOSPITAL_COMMUNITY): Payer: Medicare Other

## 2016-05-29 ENCOUNTER — Ambulatory Visit (HOSPITAL_COMMUNITY)
Admission: RE | Admit: 2016-05-29 | Discharge: 2016-05-29 | Disposition: A | Discharge status: Home / Self Care | Payer: Medicare Other | Source: Ambulatory Visit | Attending: Oncology | Admitting: Oncology

## 2016-05-29 ENCOUNTER — Encounter (HOSPITAL_COMMUNITY): Payer: Self-pay | Admitting: Oncology

## 2016-05-29 ENCOUNTER — Encounter (HOSPITAL_COMMUNITY): Payer: Medicare Other | Attending: Oncology | Admitting: Oncology

## 2016-05-29 VITALS — BP 168/79 | HR 75 | Temp 98.5°F | Resp 16 | Wt 119.0 lb

## 2016-05-29 DIAGNOSIS — C20 Malignant neoplasm of rectum: Secondary | ICD-10-CM

## 2016-05-29 DIAGNOSIS — M25551 Pain in right hip: Secondary | ICD-10-CM

## 2016-05-29 LAB — CBC WITH DIFFERENTIAL/PLATELET
BASOS ABS: 0 10*3/uL (ref 0.0–0.1)
BASOS PCT: 0 %
Eosinophils Absolute: 0.1 10*3/uL (ref 0.0–0.7)
Eosinophils Relative: 2 %
HEMATOCRIT: 34 % — AB (ref 36.0–46.0)
HEMOGLOBIN: 11 g/dL — AB (ref 12.0–15.0)
Lymphocytes Relative: 4 %
Lymphs Abs: 0.3 10*3/uL — ABNORMAL LOW (ref 0.7–4.0)
MCH: 31.5 pg (ref 26.0–34.0)
MCHC: 32.4 g/dL (ref 30.0–36.0)
MCV: 97.4 fL (ref 78.0–100.0)
MONO ABS: 0.6 10*3/uL (ref 0.1–1.0)
Monocytes Relative: 9 %
NEUTROS ABS: 5.8 10*3/uL (ref 1.7–7.7)
NEUTROS PCT: 85 %
Platelets: 259 10*3/uL (ref 150–400)
RBC: 3.49 MIL/uL — ABNORMAL LOW (ref 3.87–5.11)
RDW: 16.5 % — AB (ref 11.5–15.5)
WBC: 6.9 10*3/uL (ref 4.0–10.5)

## 2016-05-29 LAB — COMPREHENSIVE METABOLIC PANEL
ALBUMIN: 3.6 g/dL (ref 3.5–5.0)
ALT: 10 U/L — ABNORMAL LOW (ref 14–54)
AST: 19 U/L (ref 15–41)
Alkaline Phosphatase: 58 U/L (ref 38–126)
Anion gap: 11 (ref 5–15)
BILIRUBIN TOTAL: 0.6 mg/dL (ref 0.3–1.2)
BUN: 19 mg/dL (ref 6–20)
CO2: 24 mmol/L (ref 22–32)
Calcium: 8.4 mg/dL — ABNORMAL LOW (ref 8.9–10.3)
Chloride: 105 mmol/L (ref 101–111)
Creatinine, Ser: 1.6 mg/dL — ABNORMAL HIGH (ref 0.44–1.00)
GFR calc Af Amer: 34 mL/min — ABNORMAL LOW (ref >60)
GFR calc non Af Amer: 30 mL/min — ABNORMAL LOW (ref >60)
GLUCOSE: 102 mg/dL — AB (ref 65–99)
POTASSIUM: 3.5 mmol/L (ref 3.5–5.1)
Sodium: 140 mmol/L (ref 135–145)
TOTAL PROTEIN: 7.3 g/dL (ref 6.5–8.1)

## 2016-05-29 MED ORDER — CAPECITABINE 500 MG PO TABS: 1500.0000 mg | ORAL_TABLET | Freq: Two times a day (BID) | ORAL | Status: DC

## 2016-05-29 NOTE — Patient Instructions (Signed)
[place] at [facility] Discharge Instructions  RECOMMENDATIONS MADE BY THE CONSULTANT AND ANY TEST RESULTS WILL BE SENT TO YOUR REFERRING PHYSICIAN.  Exam done and seen today by Kirby Crigler Need to have labs today. Will start Xeloda at some point Get an xray of your right hip Labs in 2 weeks about one week after you start xeloda Return to see the Doctor in 2 weeks Call the clinic for any concerns or questions.  Thank you for choosing [place] at [facility] to provide your oncology and hematology care.  To afford each patient quality time with our provider, please arrive at least 15 minutes before your scheduled appointment time.   Beginning January 23rd 2017 lab work for the [facility] will be done in the  Main lab at [facility] on 1st floor. If you have a lab appointment with the [facility] please come in thru the  Main Entrance and check in at the main information desk  You need to re-schedule your appointment should you arrive 10 or more minutes late.  We strive to give you quality time with our providers, and arriving late affects you and other patients whose appointments are after yours.  Also, if you no show three or more times for appointments you may be dismissed from the clinic at the providers discretion.     Again, thank you for choosing [facility].  Our hope is that these requests will decrease the amount of time that you wait before being seen by our physicians.       _____________________________________________________________  Should you have questions after your visit to [facility], please contact our office at [phone] between the hours of 8:30 a.m. and 4:30 p.m.  Voicemails left after 4:30 p.m. will not be returned until the following business day.  For prescription refill requests, have your pharmacy contact our office.         Resources For Cancer Patients and their  Caregivers ? American Cancer Society: Can assist with transportation, wigs, general needs, runs Look Good Feel Better.        480-715-3418 ? Cancer Care: Provides financial assistance, online support groups, medication/co-pay assistance.  1-800-813-HOPE 870-340-5180) ? [facility] Assists [place] cancer patients and their families through emotional , educational and financial support.  248-874-2248 ? [place] DSS Where to apply for food stamps, Medicaid and utility assistance. 504-126-3934 ? [facility]: Transportation to medical appointments. (434) 549-0067 ? Social Security Administration: May apply for disability if have a Stage IV cancer. 352-715-6611 9396568174 ? [employer], Disability and Transit Services: Assists with nutrition, care and transit needs. [place] Support Programs: @10RELATIVEDAYS @ > Cancer Support Group  2nd Tuesday of the month 1pm-2pm, Journey Room  > Creative Journey  3rd Tuesday of the month 1130am-1pm, Journey Room  > Look Good Feel Better  1st Wednesday of the month 10am-12 noon, Journey Room (Call [facility] to register [phone])

## 2016-05-30 LAB — CEA: CEA: 5.6 ng/mL — ABNORMAL HIGH (ref 0.0–4.7)

## 2016-06-03 ENCOUNTER — Other Ambulatory Visit (HOSPITAL_COMMUNITY): Payer: Self-pay | Admitting: Emergency Medicine

## 2016-06-03 ENCOUNTER — Other Ambulatory Visit (HOSPITAL_COMMUNITY): Payer: Self-pay | Admitting: Oncology

## 2016-06-03 DIAGNOSIS — C20 Malignant neoplasm of rectum: Secondary | ICD-10-CM

## 2016-06-03 MED ORDER — PROCHLORPERAZINE MALEATE 10 MG PO TABS: 10.0000 mg | ORAL_TABLET | Freq: Four times a day (QID) | ORAL | 2 refills | Status: DC | PRN

## 2016-06-03 MED ORDER — CAPECITABINE 500 MG PO TABS: 1500.0000 mg | ORAL_TABLET | Freq: Two times a day (BID) | ORAL | 1 refills | Status: DC

## 2016-06-03 MED ORDER — ONDANSETRON HCL 8 MG PO TABS: 8.0000 mg | ORAL_TABLET | Freq: Three times a day (TID) | ORAL | 2 refills | Status: DC | PRN

## 2016-06-03 NOTE — Patient Instructions (Signed)
[place]   CHEMOTHERAPY INSTRUCTIONS   Xeloda - diarrhea, hand-foot syndrome (hands/feet can get red/tender/and skin can peel). Avoid friction and hot environments on hands/feet. Wear cotton socks. Lotion twice a day to hands/fingers/feet/toes with Udder cream. Mucositis (inflammation of any mucosal membrane can develop -this can occur in the throat/mouth). Mouth sores, nausea/vomiting, anemia, fatigue can also develop. Take Imodium if diarrhea develops and contact us immediately - we will give you further instructions on how to take your Imodium. Xeloda usually comes with a teaching packet from the mail order/specialty pharmacy that will supply you with the drug that is really informative about diarrhea and hand-foot syndrome. No pregnant, child bearing age people, or animals should come into contact with this drug. Even though this is in pill form - it is still very powerful!!! If you should be instructed to discontinue taking this drug, bring the drug into the [facility] and we will dispose of it in a safe manner. Chemotherapy is a biohazard and must be disposed of properly. Do not touch this pill much. It would be best if the caregiver wore gloves while handling. Do not put this pill in with the rest of your pills in a pill box. Keep them in a separate pill box. If you are taking Coumadin while taking this drug, we will need to monitor your PT/INR (lab) closely because Xeloda can increase the effectiveness of Coumadin. You should take this medication within 30 minutes after eating your am and pm meals.  Your dose of Xeloda will be 1500mg  (3 tablets) in the morning and 1500mg  (3 tablets) in the evening after a meal.    SELF IMAGE NEEDS AND REFERRALS MADE: Handout given on look good feel better   EDUCATIONAL MATERIALS GIVEN AND REVIEWED: Chemotherapy and you book given.  Xeloda information given    MEDICATIONS: You have been given prescriptions for the following  medications:   Zofran/Ondansetron 8mg  tablet. Take 1 tablet every 8 hours as needed for nausea/vomiting. (#1 nausea med to take, this can constipate)  Compazine/Prochlorperazine 10mg  tablet. Take 1 tablet every 6 hours as needed for nausea/vomiting. (#2 nausea med to take, this can make you sleepy)   Over-the-Counter Meds:  Miralax 1 capful in 8 oz of fluid daily. May increase to two times a day if needed. This is a stool softener. If this doesn't work proceed you can add:  Senokot S  - start with 1 tablet two times a day and increase to 4 tablets two times a day if needed. (total of 8 tablets in a 24 hour period). This is a stimulant laxative.   Call us if this does not help your bowels move.   Imodium 2mg  capsule. Take 2 capsules after the 1st loose stool and then 1 capsule every 2 hours until you go a total of 12 hours without having a loose stool. Call the [facility] if loose stools continue. If diarrhea occurs @ bedtime, take 2 capsules @ bedtime. Then take 2 capsules every 4 hours until morning. Call [facility].      (Please refer to/review other teaching materials that have been provided to you in this blue folder - What to Know During Chemo, What to know After Chemo, Dr. Donald Pore Advice, Constipation Sheet, Diarrhea Sheet, Nausea Sheet, Self Care Activities While on Chemo)       SYMPTOMS TO REPORT AS SOON AS POSSIBLE AFTER TREATMENT:  FEVER GREATER THAN 100.5 F  CHILLS WITH OR WITHOUT FEVER  NAUSEA  AND VOMITING THAT IS NOT CONTROLLED WITH YOUR NAUSEA MEDICATION  UNUSUAL SHORTNESS OF BREATH  UNUSUAL BRUISING OR BLEEDING  TENDERNESS IN MOUTH AND THROAT WITH OR WITHOUT PRESENCE OF ULCERS  URINARY PROBLEMS  BOWEL PROBLEMS  UNUSUAL RASH    Wear comfortable clothing and clothing appropriate for easy access to any Portacath or PICC line. Let us know if there is anything that we can do to make your therapy better!      I have been informed and understand  all of the instructions given to me and have received a copy. I have been instructed to call the clinic [phone]  or my family physician as soon as possible for continued medical care, if indicated. I do not have any more questions at this time but understand that I may call the [facility] at [phone] during office hours should I have questions or need assistance in obtaining follow-up care.

## 2016-06-03 NOTE — Progress Notes (Unsigned)
Called son and notified him that teaching for his moms medication (Xeloda) would be on Wednesday at 11:30.  Notified him that I would be calling him in some nausea medications for him to pick up.  Verbalized understanding.

## 2016-06-04 ENCOUNTER — Other Ambulatory Visit: Payer: Self-pay | Admitting: Family Medicine

## 2016-06-05 ENCOUNTER — Encounter (HOSPITAL_COMMUNITY): Payer: Medicare Other

## 2016-06-10 ENCOUNTER — Encounter (HOSPITAL_COMMUNITY): Payer: Self-pay | Admitting: Emergency Medicine

## 2016-06-10 NOTE — Progress Notes (Unsigned)
Called pt to let her know that her Xeloda was prescribed 7 days on and 7 days off.  Moved her doctors appt out a week.  Told her that her prescription would be delivered tomorrow.  Verbalized understanding.

## 2016-06-12 ENCOUNTER — Ambulatory Visit (HOSPITAL_COMMUNITY): Payer: Medicare Other | Admitting: Oncology

## 2016-06-12 ENCOUNTER — Other Ambulatory Visit (HOSPITAL_COMMUNITY): Payer: Medicare Other

## 2016-06-17 ENCOUNTER — Telehealth (HOSPITAL_COMMUNITY): Payer: Self-pay | Admitting: Hematology & Oncology

## 2016-06-17 NOTE — Telephone Encounter (Signed)
XELODA APPROVED BY [facility] 06/06/16-12/07/16

## 2016-06-18 ENCOUNTER — Other Ambulatory Visit (HOSPITAL_COMMUNITY): Payer: Self-pay

## 2016-06-18 DIAGNOSIS — C20 Malignant neoplasm of rectum: Secondary | ICD-10-CM

## 2016-06-19 ENCOUNTER — Encounter (HOSPITAL_COMMUNITY): Payer: Medicare Other | Attending: Oncology | Admitting: Oncology

## 2016-06-19 ENCOUNTER — Other Ambulatory Visit (HOSPITAL_COMMUNITY): Payer: Self-pay | Admitting: Emergency Medicine

## 2016-06-19 ENCOUNTER — Encounter (HOSPITAL_COMMUNITY): Payer: Medicare Other

## 2016-06-19 ENCOUNTER — Encounter (HOSPITAL_COMMUNITY): Payer: Self-pay | Admitting: Oncology

## 2016-06-19 DIAGNOSIS — K1370 Unspecified lesions of oral mucosa: Secondary | ICD-10-CM

## 2016-06-19 DIAGNOSIS — C20 Malignant neoplasm of rectum: Secondary | ICD-10-CM | POA: Diagnosis not present

## 2016-06-19 LAB — COMPREHENSIVE METABOLIC PANEL
ALT: 22 U/L (ref 14–54)
AST: 24 U/L (ref 15–41)
Albumin: 3.4 g/dL — ABNORMAL LOW (ref 3.5–5.0)
Alkaline Phosphatase: 53 U/L (ref 38–126)
Anion gap: 8 (ref 5–15)
BUN: 19 mg/dL (ref 6–20)
CALCIUM: 8.3 mg/dL — AB (ref 8.9–10.3)
CHLORIDE: 104 mmol/L (ref 101–111)
CO2: 26 mmol/L (ref 22–32)
CREATININE: 1.62 mg/dL — AB (ref 0.44–1.00)
GFR, EST AFRICAN AMERICAN: 34 mL/min — AB (ref >60)
GFR, EST NON AFRICAN AMERICAN: 29 mL/min — AB (ref >60)
Glucose, Bld: 113 mg/dL — ABNORMAL HIGH (ref 65–99)
Potassium: 3.4 mmol/L — ABNORMAL LOW (ref 3.5–5.1)
Sodium: 138 mmol/L (ref 135–145)
TOTAL PROTEIN: 6.6 g/dL (ref 6.5–8.1)
Total Bilirubin: 0.3 mg/dL (ref 0.3–1.2)

## 2016-06-19 LAB — CBC WITH DIFFERENTIAL/PLATELET
Basophils Absolute: 0 10*3/uL (ref 0.0–0.1)
Basophils Relative: 0 %
EOS PCT: 2 %
Eosinophils Absolute: 0.1 10*3/uL (ref 0.0–0.7)
HCT: 33.8 % — ABNORMAL LOW (ref 36.0–46.0)
Hemoglobin: 11.1 g/dL — ABNORMAL LOW (ref 12.0–15.0)
LYMPHS ABS: 0.4 10*3/uL — AB (ref 0.7–4.0)
LYMPHS PCT: 7 %
MCH: 31.9 pg (ref 26.0–34.0)
MCHC: 32.8 g/dL (ref 30.0–36.0)
MCV: 97.1 fL (ref 78.0–100.0)
MONO ABS: 0.2 10*3/uL (ref 0.1–1.0)
Monocytes Relative: 5 %
Neutro Abs: 4.1 10*3/uL (ref 1.7–7.7)
Neutrophils Relative %: 86 %
PLATELETS: 240 10*3/uL (ref 150–400)
RBC: 3.48 MIL/uL — AB (ref 3.87–5.11)
RDW: 14.9 % (ref 11.5–15.5)
WBC: 4.8 10*3/uL (ref 4.0–10.5)

## 2016-06-19 MED ORDER — TEMAZEPAM 15 MG PO CAPS: 15.0000 mg | ORAL_CAPSULE | Freq: Every evening | ORAL | 1 refills | Status: DC | PRN

## 2016-06-19 NOTE — Assessment & Plan Note (Addendum)
Stage IIA (T3N0M0) rectal adenocarcinoma, S/P resection of rectal mass by Dr. Marcello Moores on 12/14/2015, now S/P concomitant chemoradiation finishing on 04/02/2016.  She has refused surgical intervention.  Now on adjuvant Xeloda 1500 mg PO BID 7 days on and 7 days off x 4 months beginning on 06/13/2016  Oncology history is updated.  Labs today: CBC diff, CMET.  I personally reviewed and went over laboratory results with the patient.  The results are noted within this dictation.  She is tolerating Xeloda well without any complaints today.  She notes 1 single left lower mouth sore which is thought to be secondary to trauma from her lower dentures.  She will not remove her lower denture for inspection today.  She otherwise denies any diarrhea and palmar-plantar erythrodysesthesia.  Labs in 2 weeks: CBC diff, CMET.  Return in ~ 2 weeks for follow-up.

## 2016-06-19 NOTE — Progress Notes (Addendum)
[name], MD [place] [address]  Rectal adenocarcinoma [facility]) - Plan: CBC with Differential, Comprehensive metabolic panel  CURRENT THERAPY: Xeloda 1500 mg PO BID 7 days on and 7 days off x 4 months beginning on 06/13/2016   INTERVAL HISTORY: Meghan Anderson 80 y.o. female returns for followup of Stage IIA (T3N0M0) rectal adenocarcinoma, S/P resection of rectal mass by Dr. Marcello Moores on 12/14/2015, now S/P concomitant chemoradiation finishing on 04/02/2016.  She has refused surgical intervention after concomitant chemoradiation.  Now on adjuvant Xeloda 1500 mg PO BID 7 days on and 7 days off x 4 months beginning on 06/13/2016     Rectal adenocarcinoma ([place])   11/01/2015 Procedure    Colonoscopy by Dr. [name]     11/01/2015 Pathology Results    Rectum, biopsy, mass - FRAGMENTS OF TUBULAR ADENOMA WITH HIGH GRADE GLANDULAR DYSPLASIA.     11/07/2015 Imaging    CT abd/pelvis- Irregular annular wall thickening in the mid rectum with associated rectal intussusception, presumably representing a primary rectal neoplasm. Borderline prominent upper presacral lymph node, possibly metastatic.     12/14/2015 Definitive Surgery    Rectum, resection, rectal mass by Dr. [name]     02/14/2862 Pathology Results    Rectum, resection, rectal mass MODERATELY DIFFERENTIATED RECTAL ADENOCARCINOMA (4.3 CM) THE TUMOR INVADES THROUGH THE MUSCULARIS PROPRIA INTO PERICOLORECTAL TISSUE MARGINS OF RESECTION IS POSITIVE     12/14/2015 Pathology Results    There is a very low probability that microsatellite instability (MSI) is present.     01/15/2016 Procedure    Colonoscopy by Dr. [name]     01/15/2016 Pathology Results    1. Colon, polyp(s), descending - TUBULAR ADENOMA. NO HIGH GRADE DYSPLASIA OR MALIGNANCY IDENTIFIED. 2. Colon, polyp(s), splenic flexure - TUBULAR ADENOMA. NO HIGH GRADE DYSPLASIA OR MALIGNANCY IDENTIFIED. 3. Colon, polyp(s), ascending - TUBULAR ADENO     01/24/2016  Imaging    CT abd/pelvis- Nonspecific mild residual rectal wall thickening with associated mild perirectal fat stranding posteriorly in the mid to upper rectum (8-9 cm superior to the anal verge), which could represent postsurgical changes and/or residual tumor.      02/20/2016 - 04/02/2016 Chemotherapy    5FU CI with XRT     02/20/2016 - 04/02/2016 Radiation Therapy    Dr. Pablo Ledger     03/08/2016 Treatment Plan Change    5 FU dose reduction by 20% beginning on cycle #3.     04/02/2016 Procedure    PICC removed.     05/17/2016 Procedure    Flexible sigmoidoscopy by Dr. [name]     06/11/7710 Pathology Results    Colon, biopsy, resection margin - BENIGN COLONIC MUCOSA. - NO DYSPLASIA OR MALIGNANCY.     06/13/2016 -  Chemotherapy    Xeloda 1500 mg PO BID 7 days on and 7 days off x 4 months     She is tolerating Xeloda well.  She denies any complaints associated with toxicities of Xeloda, but as an after thought, she notes a left lower gum sore that she thinks is secondary to trauma from her lower denture.  She denies any other sore or lesion in her mouth.  Her appetite is stable.  Review of Systems  Constitutional: Negative for chills, fever and weight loss.  HENT: Negative.   Eyes: Negative.   Respiratory: Negative.  Negative for cough and hemoptysis.   Cardiovascular: Negative.  Negative for chest pain.  Gastrointestinal:  Negative.  Negative for constipation, diarrhea, nausea and vomiting.  Genitourinary: Negative.   Musculoskeletal: Positive for joint pain (right hip pain without known trauma and negative plain films).  Skin: Negative.  Negative for rash.  Neurological: Negative.  Negative for weakness.  Endo/Heme/Allergies: Negative.   Psychiatric/Behavioral: Negative.     Past Medical History:  Diagnosis Date  . Arthritis   . Cataract   . Chronic bronchitis ([place])   . Chronic renal disease, stage 3, moderately decreased glomerular filtration rate (GFR) between 30-59  mL/min/1.73 square meter 02/21/2016  . Complication of anesthesia   . COPD (chronic obstructive pulmonary disease) ([place])   . GERD (gastroesophageal reflux disease)   . Hyperlipidemia   . Hypertension   . Hypothyroidism   . Murmur, heart   . PONV (postoperative nausea and vomiting)   . Rectal adenocarcinoma ([place])   . Thyroid disease     Past Surgical History:  Procedure Laterality Date  . ABDOMINAL HYSTERECTOMY     complete  . COLONOSCOPY N/A 11/01/2015   RMR: Large fungating rectal mass precluded colonoscopy. Status post biopsy.   . COLONOSCOPY N/A 01/15/2016   Procedure: COLONOSCOPY;  Surgeon: [name], MD;  Location: [facility];  Service: Endoscopy;  Laterality: N/A;  130   . FLEXIBLE SIGMOIDOSCOPY N/A 05/17/2016   Procedure: FLEXIBLE SIGMOIDOSCOPY;  Surgeon: [name], MD;  Location: [facility];  Service: Endoscopy;  Laterality: N/A;  . Left ear    . TRANSANAL ENDOSCOPIC MICROSURGERY N/A 12/14/2015   Procedure: TRANSANAL ENDOSCOPIC MICROSURGERY OF RECTAL POLYP;  Surgeon: [name], MD;  Location: [facility] ORS;  Service: General;  Laterality: N/A;    Family History  Problem Relation Age of Onset  . Kidney disease Mother     nephrectomy x 1  . COPD Brother   . Emphysema Brother   . Asthma Brother   . Cancer Brother 50    brain  . Colon cancer Neg Hx     Social History   Social History  . Marital status: Widowed    Spouse name: N/A  . Number of children: N/A  . Years of education: N/A   Social History Main Topics  . Smoking status: Former Smoker    Quit date: 11/11/2010  . Smokeless tobacco: Never Used  . Alcohol use No  . Drug use: No  . Sexual activity: Not Asked   Other Topics Concern  . None   Social History Narrative  . None     PHYSICAL EXAMINATION  ECOG PERFORMANCE STATUS: 0 - Asymptomatic  Vitals:   06/19/16 1141  Pulse: 70  Resp: 18  Temp: 98.3 F (36.8 C)    BP 160/85 P 70 T 98.3 F R 18 O2 sat 99% on RA  GENERAL:alert, no  distress, well nourished, well developed, comfortable, cooperative, smiling and accompanied by her daughter and granddaughter, Meghan Anderson. SKIN: skin color, texture, turgor are normal, no rashes or significant lesions HEAD: Normocephalic, No masses, lesions, tenderness or abnormalities EYES: normal, EOMI, Conjunctiva are pink and non-injected EARS: External ears normal OROPHARYNX:lips, buccal mucosa, and tongue normal and mucous membranes are moist, patient refuses to remove her lower dentures. NECK: supple, trachea midline LYMPH:  no palpable lymphadenopathy BREAST:not examined LUNGS: CTA B/L without wheezing, rales, or rhonchi. HEART: regular rate & rhythm ABDOMEN:abdomen soft, non-tender and normal bowel sounds BACK: Back symmetric, no curvature. EXTREMITIES:less then 2 second capillary refill, no joint deformities, effusion, or inflammation, no skin discoloration, no cyanosis  NEURO: alert & oriented x  3 with fluent speech, no focal motor/sensory deficits, gait normal   LABORATORY DATA: CBC    Component Value Date/Time   WBC 4.8 06/19/2016 1047   RBC 3.48 (L) 06/19/2016 1047   HGB 11.1 (L) 06/19/2016 1047   HCT 33.8 (L) 06/19/2016 1047   HCT 33.1 (L) 11/07/2015 1255   PLT 240 06/19/2016 1047   PLT 248 11/07/2015 1255   MCV 97.1 06/19/2016 1047   MCV 87 11/07/2015 1255   MCH 31.9 06/19/2016 1047   MCHC 32.8 06/19/2016 1047   RDW 14.9 06/19/2016 1047   RDW 15.6 (H) 11/07/2015 1255   LYMPHSABS 0.4 (L) 06/19/2016 1047   LYMPHSABS 0.8 11/07/2015 1255   MONOABS 0.2 06/19/2016 1047   EOSABS 0.1 06/19/2016 1047   EOSABS 0.1 11/07/2015 1255   BASOSABS 0.0 06/19/2016 1047   BASOSABS 0.0 11/07/2015 1255      Chemistry      Component Value Date/Time   NA 138 06/19/2016 1047   NA 140 11/07/2015 1255   K 3.4 (L) 06/19/2016 1047   CL 104 06/19/2016 1047   CO2 26 06/19/2016 1047   BUN 19 06/19/2016 1047   BUN 14 11/07/2015 1255   CREATININE 1.62 (H) 06/19/2016 1047      Component  Value Date/Time   CALCIUM 8.3 (L) 06/19/2016 1047   ALKPHOS 53 06/19/2016 1047   AST 24 06/19/2016 1047   ALT 22 06/19/2016 1047   BILITOT 0.3 06/19/2016 1047   BILITOT 0.2 12/29/2014 1155      Lab Results  Component Value Date   CEA 5.6 (H) 05/29/2016     PENDING LABS:   RADIOGRAPHIC STUDIES:  Ct Abdomen Pelvis Wo Contrast  Result Date: 05/23/2016 CLINICAL DATA:  Sudden onset right lateral abdominal and right inferior chest pain since 2 p.m. today. Nausea and vomiting. History of surgery for rectal cancer and degree 2017 with subsequent chemo therapy. EXAM: CT ABDOMEN AND PELVIS WITHOUT CONTRAST TECHNIQUE: Multidetector CT imaging of the abdomen and pelvis was performed following the standard protocol without IV contrast. COMPARISON:  CT pelvis 02/09/2016. CT abdomen and pelvis 11/07/2015. FINDINGS: Mild emphysematous changes and scattered fibrosis in the lung bases. Calcification in the mitral valve annulus. The unenhanced appearance of the liver, spleen, gallbladder, pancreas, adrenal glands, kidneys, inferior vena cava, and retroperitoneal lymph nodes is unremarkable. Diffuse calcification of the abdominal aorta without aneurysm. Stomach, small bowel, and colon are not abnormally distended. No free air or free fluid in the abdomen. Pelvis: Previous rectal lesion has been removed. Diverticulosis of the sigmoid colon without evidence of diverticulitis. No free or loculated pelvic fluid collections. No pelvic mass or lymphadenopathy. Bladder wall is not thickened. Degenerative changes in the spine. No destructive bone lesions. Sclerosis and contour deformity to the superior aspect of the left femoral head likely representing avascular necrosis. IMPRESSION: No acute process demonstrated in the abdomen or pelvis. No evidence of bowel obstruction or inflammation. Diverticulosis of the sigmoid colon without diverticulitis. Aortic atherosclerosis. Electronically Signed   By: Lucienne Capers M.D.    On: 05/23/2016 00:01   Dg Chest 2 View  Result Date: 05/22/2016 CLINICAL DATA:  Right upper quadrant abdominal pain with shortness of breath today. EXAM: CHEST  2 VIEW COMPARISON:  04/21/2016 FINDINGS: Shallow inspiration with atelectasis in the lung bases. Colonic interposition under the right hemidiaphragm. Heart size and pulmonary vascularity are normal for technique. No focal airspace disease or consolidation in the lungs. No blunting of costophrenic angles. No pneumothorax. Degenerative changes in  the spine and shoulders. Calcification of the aorta. IMPRESSION: Shallow inspiration with atelectasis in the lung bases. No focal consolidation. Aortic atherosclerosis. Electronically Signed   By: Lucienne Capers M.D.   On: 05/22/2016 22:33   Dg Hip Unilat With Pelvis 2-3 Views Right  Result Date: 05/29/2016 CLINICAL DATA:  Generalized right hip pain with limping for the past month ; concluded radiation therapy for rectal cancer 1 month ago. EXAM: DG HIP (WITH OR WITHOUT PELVIS) 2-3V RIGHT COMPARISON:  Abdominal and pelvic CT scan of May 22, 2016 FINDINGS: The bones are subjectively osteopenic. There is mild symmetric narrowing of the right hip joint space. The articular surfaces of the right femoral head and acetabulum remains smoothly rounded. The femoral neck, intertrochanteric, and subtrochanteric regions are normal. No lytic or blastic lesion of the pelvis is observed. Incidental note is made of moderate to severe degenerative change of the left hip joint. There degenerative changes of the lumbar spine. IMPRESSION: No acute or significant chronic bony abnormality of the right hip. Very mild symmetric hip joint space narrowing on the right. Electronically Signed   By: David  Martinique M.D.   On: 05/29/2016 11:22     PATHOLOGY:    ASSESSMENT AND PLAN:  Rectal adenocarcinoma ([facility]) Stage IIA (T3N0M0) rectal adenocarcinoma, S/P resection of rectal mass by Dr. Marcello Moores on 12/14/2015, now S/P concomitant  chemoradiation finishing on 04/02/2016.  She has refused surgical intervention.  Now on adjuvant Xeloda 1500 mg PO BID 7 days on and 7 days off x 4 months beginning on 06/13/2016  Oncology history is updated.  Labs today: CBC diff, CMET.  I personally reviewed and went over laboratory results with the patient.  The results are noted within this dictation.  She is tolerating Xeloda well without any complaints today.  She notes 1 single left lower mouth sore which is thought to be secondary to trauma from her lower dentures.  She will not remove her lower denture for inspection today.  She otherwise denies any diarrhea and palmar-plantar erythrodysesthesia.  Labs in 2 weeks: CBC diff, CMET.  Return in ~ 2 weeks for follow-up.   ORDERS PLACED FOR THIS ENCOUNTER: Orders Placed This Encounter  Procedures  . CBC with Differential  . Comprehensive metabolic panel    MEDICATIONS PRESCRIBED THIS ENCOUNTER: No orders of the defined types were placed in this encounter.   THERAPY PLAN:  Planning to start adjuvant Xeloda.  All questions were answered. The patient knows to call the clinic with any problems, questions or concerns. We can certainly see the patient much sooner if necessary.  Patient and plan discussed with Dr. Ancil Linsey and she is in agreement with the aforementioned.   This note is electronically signed by: Doy Mince 06/19/2016 6:21 PM

## 2016-06-19 NOTE — Progress Notes (Unsigned)
Called in restoril for pt to the Drug store.  Pt forgot to ask for something to sleep while she was here seeing Tom at the office visit.

## 2016-06-19 NOTE — Patient Instructions (Signed)
[place] at  [facility] Discharge Instructions  RECOMMENDATIONS MADE BY THE CONSULTANT AND ANY TEST RESULTS WILL BE SENT TO YOUR REFERRING PHYSICIAN.  Continue Xeloda 1500 mg twice per day for 7 days in a row, then 7 day break. Labs today are stable.  Low Potassium is noted.  Increase potassium pill by 1 additional pill per day for 1 week.  Then return to 1 pill per day. Maximum amount of tylenol per day is 3000 mg.  DO NOT EXCEED THIS AMOUNT in 24 hour period. Return in approximately 2 weeks for labs and follow-up appointment.  Thank you for choosing [place] at [facility] to provide your oncology and hematology care.  To afford each patient quality time with our provider, please arrive at least 15 minutes before your scheduled appointment time.   Beginning January 23rd 2017 lab work for the [facility] will be done in the  Main lab at [facility] on 1st floor. If you have a lab appointment with the [facility] please come in thru the  Main Entrance and check in at the main information desk  You need to re-schedule your appointment should you arrive 10 or more minutes late.  We strive to give you quality time with our providers, and arriving late affects you and other patients whose appointments are after yours.  Also, if you no show three or more times for appointments you may be dismissed from the clinic at the providers discretion.     Again, thank you for choosing [facility].  Our hope is that these requests will decrease the amount of time that you wait before being seen by our physicians.       _____________________________________________________________  Should you have questions after your visit to [facility], please contact our office at [phone] between the hours of 8:30 a.m. and 4:30 p.m.  Voicemails left after 4:30 p.m. will not be returned until the following business day.  For prescription  refill requests, have your pharmacy contact our office.         Resources For Cancer Patients and their Caregivers ? American Cancer Society: Can assist with transportation, wigs, general needs, runs Look Good Feel Better.        (438)157-9235 ? Cancer Care: Provides financial assistance, online support groups, medication/co-pay assistance.  1-800-813-HOPE 337-626-0757) ? [facility] Assists [place] cancer patients and their families through emotional , educational and financial support.  (864)091-3540 ? [place] DSS Where to apply for food stamps, Medicaid and utility assistance. 8048354516 ? [facility]: Transportation to medical appointments. 947-649-7485 ? Social Security Administration: May apply for disability if have a Stage IV cancer. 347-054-3469 (340)516-5776 ? [employer], Disability and Transit Services: Assists with nutrition, care and transit needs. [place] Support Programs: @10RELATIVEDAYS @ > Cancer Support Group  2nd Tuesday of the month 1pm-2pm, Journey Room  > Creative Journey  3rd Tuesday of the month 1130am-1pm, Journey Room  > Look Good Feel Better  1st Wednesday of the month 10am-12 noon, Journey Room (Call [place] to register [phone])

## 2016-06-20 LAB — CEA: CEA: 6.1 ng/mL — ABNORMAL HIGH (ref 0.0–4.7)

## 2016-07-04 ENCOUNTER — Encounter: Payer: Self-pay | Admitting: Family Medicine

## 2016-07-04 ENCOUNTER — Ambulatory Visit (INDEPENDENT_AMBULATORY_CARE_PROVIDER_SITE_OTHER): Payer: Medicare Other | Admitting: Family Medicine

## 2016-07-04 VITALS — BP 162/92 | HR 73 | Temp 97.6°F | Ht 62.0 in | Wt 121.0 lb

## 2016-07-04 DIAGNOSIS — I10 Essential (primary) hypertension: Secondary | ICD-10-CM

## 2016-07-04 DIAGNOSIS — Z6822 Body mass index (BMI) 22.0-22.9, adult: Secondary | ICD-10-CM | POA: Diagnosis not present

## 2016-07-04 DIAGNOSIS — E039 Hypothyroidism, unspecified: Secondary | ICD-10-CM

## 2016-07-04 NOTE — Progress Notes (Signed)
BP (!) 162/92   Pulse 73   Temp 97.6 F (36.4 C) (Oral)   Ht 5\' 2"  (1.575 m)   Wt 121 lb (54.9 kg)   BMI 22.13 kg/m    Subjective:    Patient ID: Meghan Anderson, female    DOB: 01-08-1936, 81 y.o.   MRN: [id number]  HPI: Meghan Anderson is a 80 y.o. female presenting on 07/04/2016 for Follow-up and Hypertension   HPI Hypertension recheck Patient is coming in for hypertension recheck today. She is currently on lisinopril-hydrochlorothiazide. She has been out of her medication because she ran out of it yesterday. Her blood pressure today is 162/92. She is currently going through chemotherapy and radiation and that has affected some of her blood counts. They have been consistently monitoring her blood pressure as well. Patient denies headaches, blurred vision, chest pains, shortness of breath, or weakness. Denies any side effects from medication and is content with current medication.   Hypothyroidism Patient has known hypothyroidism and is currently taking 75 g or half of her 150 g dose. She is currently going through chemotherapy regimens and finished radiation regimens for rectal cancer and they have checked her thyroid levels intermittently per her that I do not see those results. We will check them again with her labs tomorrow and see where her levels are at. The last time that we checked her TSH was 126 but she had been off the medication for quite some time. She denies any issues with palpitations or diarrhea or constipation. She was taking 150 g of felt like it was too much so she started cut in half.  Relevant past medical, surgical, family and social history reviewed and updated as indicated. Interim medical history since our last visit reviewed. Allergies and medications reviewed and updated.  Review of Systems  Constitutional: Negative for chills and fever.  HENT: Negative for congestion, ear discharge and ear pain.   Eyes: Negative for redness and visual disturbance.    Respiratory: Negative for chest tightness and shortness of breath.   Cardiovascular: Negative for chest pain and leg swelling.  Endocrine: Negative for cold intolerance, heat intolerance, polydipsia and polyuria.  Genitourinary: Negative for difficulty urinating and dysuria.  Musculoskeletal: Negative for back pain and gait problem.  Skin: Negative for rash.  Neurological: Negative for light-headedness and headaches.  Psychiatric/Behavioral: Negative for agitation and behavioral problems.  All other systems reviewed and are negative.   Per HPI unless specifically indicated above     Medication List       Accurate as of 07/04/16  1:56 PM. Always use your most recent med list.          albuterol (2.5 MG/3ML) 0.083% nebulizer solution Commonly known as:  PROVENTIL NEBULIZE 1 VIAL EVERY 4 HOURS AS NEEDED   amLODipine 5 MG tablet Commonly known as:  NORVASC Take 1 tablet (5 mg total) by mouth daily.   atorvastatin 80 MG tablet Commonly known as:  LIPITOR Take 1 tablet (80 mg total) by mouth daily.   BREO ELLIPTA 100-25 MCG/INH Aepb Generic drug:  fluticasone furoate-vilanterol Reported on 05/29/2016   capecitabine 500 MG tablet Commonly known as:  XELODA Take 3 tablets (1,500 mg total) by mouth 2 (two) times daily after a meal. Take 1500 mg BID 7 days on and 7 days off.   levothyroxine 150 MCG tablet Commonly known as:  SYNTHROID, LEVOTHROID Take 1 tablet (150 mcg total) by mouth daily.   lisinopril-hydrochlorothiazide 20-12.5 MG tablet Commonly known  as:  PRINZIDE,ZESTORETIC TAKE TWO TABLETS BY MOUTH DAILY   omeprazole 40 MG capsule Commonly known as:  PRILOSEC TAKE ONE (1) CAPSULE EACH DAY   potassium chloride SA 20 MEQ tablet Commonly known as:  K-DUR,KLOR-CON TAKE ONE (1) TABLET EACH DAY   temazepam 15 MG capsule Commonly known as:  RESTORIL Take 1 capsule (15 mg total) by mouth at bedtime as needed for sleep.   tiotropium 18 MCG inhalation  capsule Commonly known as:  SPIRIVA HANDIHALER Place 1 capsule (18 mcg total) into inhaler and inhale daily.         Objective:    BP (!) 162/92   Pulse 73   Temp 97.6 F (36.4 C) (Oral)   Ht 5\' 2"  (1.575 m)   Wt 121 lb (54.9 kg)   BMI 22.13 kg/m   Wt Readings from Last 3 Encounters:  07/04/16 121 lb (54.9 kg)  06/19/16 116 lb 3.2 oz (52.7 kg)  05/29/16 119 lb (54 kg)    Physical Exam  Constitutional: She is oriented to person, place, and time. She appears well-developed and well-nourished. No distress.  Eyes: Conjunctivae and EOM are normal. Pupils are equal, round, and reactive to light.  Neck: Neck supple. No thyromegaly present.  Cardiovascular: Normal rate, regular rhythm, normal heart sounds and intact distal pulses.   No murmur heard. Pulmonary/Chest: Effort normal and breath sounds normal. No respiratory distress. She has no wheezes. She has no rales.  Musculoskeletal: Normal range of motion. She exhibits no edema or tenderness.  Lymphadenopathy:    She has no cervical adenopathy.  Neurological: She is alert and oriented to person, place, and time. Coordination normal.  Skin: Skin is warm and dry. No rash noted. She is not diaphoretic.  Psychiatric: She has a normal mood and affect. Her behavior is normal.  Nursing note and vitals reviewed.     Assessment & Plan:   Problem List Items Addressed This Visit      Cardiovascular and Mediastinum   Essential hypertension, benign - Primary     Endocrine   Hypothyroidism     Other   Body mass index (BMI) of 22.0-22.9 in adult    Other Visit Diagnoses   None.      Follow up plan: Return in about 6 months (around 01/04/2017), or if symptoms worsen or fail to improve, for Recheck thyroid and hypertension.  Counseling provided for all of the vaccine components No orders of the defined types were placed in this encounter.   Caryl Pina, MD [facility] 07/04/2016, 1:56 PM

## 2016-07-05 ENCOUNTER — Encounter (HOSPITAL_COMMUNITY): Payer: Medicare Other

## 2016-07-05 ENCOUNTER — Encounter (HOSPITAL_BASED_OUTPATIENT_CLINIC_OR_DEPARTMENT_OTHER): Payer: Medicare Other | Admitting: Oncology

## 2016-07-05 ENCOUNTER — Encounter (HOSPITAL_COMMUNITY): Payer: Self-pay | Admitting: Oncology

## 2016-07-05 DIAGNOSIS — C20 Malignant neoplasm of rectum: Secondary | ICD-10-CM

## 2016-07-05 DIAGNOSIS — E039 Hypothyroidism, unspecified: Secondary | ICD-10-CM

## 2016-07-05 DIAGNOSIS — I10 Essential (primary) hypertension: Secondary | ICD-10-CM

## 2016-07-05 LAB — CBC WITH DIFFERENTIAL/PLATELET
Basophils Absolute: 0 10*3/uL (ref 0.0–0.1)
Basophils Relative: 1 %
EOS PCT: 3 %
Eosinophils Absolute: 0.1 10*3/uL (ref 0.0–0.7)
HEMATOCRIT: 33.2 % — AB (ref 36.0–46.0)
Hemoglobin: 11 g/dL — ABNORMAL LOW (ref 12.0–15.0)
LYMPHS PCT: 9 %
Lymphs Abs: 0.4 10*3/uL — ABNORMAL LOW (ref 0.7–4.0)
MCH: 32.2 pg (ref 26.0–34.0)
MCHC: 33.1 g/dL (ref 30.0–36.0)
MCV: 97.1 fL (ref 78.0–100.0)
MONO ABS: 0.3 10*3/uL (ref 0.1–1.0)
MONOS PCT: 6 %
NEUTROS ABS: 3.6 10*3/uL (ref 1.7–7.7)
Neutrophils Relative %: 81 %
PLATELETS: 149 10*3/uL — AB (ref 150–400)
RBC: 3.42 MIL/uL — ABNORMAL LOW (ref 3.87–5.11)
RDW: 16.2 % — AB (ref 11.5–15.5)
WBC: 4.4 10*3/uL (ref 4.0–10.5)

## 2016-07-05 LAB — COMPREHENSIVE METABOLIC PANEL
ALT: 24 U/L (ref 14–54)
ANION GAP: 11 (ref 5–15)
AST: 33 U/L (ref 15–41)
Albumin: 3.7 g/dL (ref 3.5–5.0)
Alkaline Phosphatase: 51 U/L (ref 38–126)
BILIRUBIN TOTAL: 0.6 mg/dL (ref 0.3–1.2)
BUN: 21 mg/dL — AB (ref 6–20)
CHLORIDE: 101 mmol/L (ref 101–111)
CO2: 27 mmol/L (ref 22–32)
Calcium: 8.7 mg/dL — ABNORMAL LOW (ref 8.9–10.3)
Creatinine, Ser: 1.63 mg/dL — ABNORMAL HIGH (ref 0.44–1.00)
GFR, EST AFRICAN AMERICAN: 34 mL/min — AB (ref >60)
GFR, EST NON AFRICAN AMERICAN: 29 mL/min — AB (ref >60)
Glucose, Bld: 106 mg/dL — ABNORMAL HIGH (ref 65–99)
POTASSIUM: 3.3 mmol/L — AB (ref 3.5–5.1)
Sodium: 139 mmol/L (ref 135–145)
TOTAL PROTEIN: 6.6 g/dL (ref 6.5–8.1)

## 2016-07-05 LAB — LIPID PANEL
CHOL/HDL RATIO: 4.3 ratio
CHOLESTEROL: 264 mg/dL — AB (ref 0–200)
HDL: 62 mg/dL (ref >40)
LDL Cholesterol: 171 mg/dL — ABNORMAL HIGH (ref 0–99)
TRIGLYCERIDES: 157 mg/dL — AB (ref <150)
VLDL: 31 mg/dL (ref 0–40)

## 2016-07-05 LAB — TSH: TSH: >90 u[IU]/mL — ABNORMAL HIGH (ref 0.350–4.500)

## 2016-07-05 MED ORDER — CAPECITABINE 500 MG PO TABS: 1000.0000 mg | ORAL_TABLET | Freq: Two times a day (BID) | ORAL | 1 refills | Status: DC

## 2016-07-05 MED ORDER — LEVOTHYROXINE SODIUM 88 MCG PO TABS: 88.0000 ug | ORAL_TABLET | Freq: Every day | ORAL | 1 refills | Status: DC

## 2016-07-05 MED ORDER — LISINOPRIL-HYDROCHLOROTHIAZIDE 20-12.5 MG PO TABS: 2.0000 | ORAL_TABLET | Freq: Every day | ORAL | 3 refills | Status: DC

## 2016-07-05 NOTE — Patient Instructions (Signed)
[place] at [facility] Discharge Instructions  RECOMMENDATIONS MADE BY THE CONSULTANT AND ANY TEST RESULTS WILL BE SENT TO YOUR REFERRING PHYSICIAN.  You saw Kirby Crigler PA-C today.  He held your Xeloda another week and you will restart that in 9/6.  Take your Restoril for sleep. Increase your your Kdur to 2 times a day for 2 weeks.  Labs were done today and need to be taken again in 2 weeks.  Return around the 15th for follow up.   Thank you for choosing [place] at [facility] to provide your oncology and hematology care.  To afford each patient quality time with our provider, please arrive at least 15 minutes before your scheduled appointment time.   Beginning January 23rd 2017 lab work for the [facility] will be done in the  Main lab at [facility] on 1st floor. If you have a lab appointment with the [facility] please come in thru the  Main Entrance and check in at the main information desk  You need to re-schedule your appointment should you arrive 10 or more minutes late.  We strive to give you quality time with our providers, and arriving late affects you and other patients whose appointments are after yours.  Also, if you no show three or more times for appointments you may be dismissed from the clinic at the providers discretion.     Again, thank you for choosing [facility].  Our hope is that these requests will decrease the amount of time that you wait before being seen by our physicians.       _____________________________________________________________  Should you have questions after your visit to [facility], please contact our office at [phone] between the hours of 8:30 a.m. and 4:30 p.m.  Voicemails left after 4:30 p.m. will not be returned until the following business day.  For prescription refill requests, have your pharmacy contact our office.         Resources For Cancer  Patients and their Caregivers ? American Cancer Society: Can assist with transportation, wigs, general needs, runs Look Good Feel Better.        6693501595 ? Cancer Care: Provides financial assistance, online support groups, medication/co-pay assistance.  1-800-813-HOPE 281 075 8083) ? [place] Assists [place] cancer patients and their families through emotional , educational and financial support.  (810) 094-8210 ? [place] DSS Where to apply for food stamps, Medicaid and utility assistance. 251-004-6314 ? [facility]: Transportation to medical appointments. 910-515-0631 ? Social Security Administration: May apply for disability if have a Stage IV cancer. (612)145-1666 520 407 1194 ? [employer], Disability and Transit Services: Assists with nutrition, care and transit needs. [place] Support Programs: @10RELATIVEDAYS @ > Cancer Support Group  2nd Tuesday of the month 1pm-2pm, Journey Room  > Creative Journey  3rd Tuesday of the month 1130am-1pm, Journey Room  > Look Good Feel Better  1st Wednesday of the month 10am-12 noon, Journey Room (Call [place] to register [phone])

## 2016-07-06 NOTE — Progress Notes (Signed)
[name], MD [place] [address]  Rectal adenocarcinoma [facility]) - Plan: capecitabine (XELODA) 500 MG tablet, CBC with Differential, Comprehensive metabolic panel, DISCONTINUED: capecitabine (XELODA) 500 MG tablet  CURRENT THERAPY: Xeloda 1500 mg PO BID 7 days on and 7 days off x 4 months beginning on 06/13/2016.  Dose being reduced to 1000 mg BID 7 days on and 7 days off on 07/17/2016.   INTERVAL HISTORY: Meghan Anderson 80 y.o. female returns for followup of Stage IIA (T3N0M0) rectal adenocarcinoma, S/P resection of rectal mass by Dr. Marcello Moores on 12/14/2015, now S/P concomitant chemoradiation finishing on 04/02/2016.  She has refused surgical intervention after concomitant chemoradiation.  Now on adjuvant Xeloda beginning on 06/13/2016 requiring a dose reduction on 07/06/2016 to 1000 mg PO BID 7 days on and 7 days off x 4 months.    Rectal adenocarcinoma ([place])   11/01/2015 Procedure    Colonoscopy by Dr. [name]      11/01/2015 Pathology Results    Rectum, biopsy, mass - FRAGMENTS OF TUBULAR ADENOMA WITH HIGH GRADE GLANDULAR DYSPLASIA.      11/07/2015 Imaging    CT abd/pelvis- Irregular annular wall thickening in the mid rectum with associated rectal intussusception, presumably representing a primary rectal neoplasm. Borderline prominent upper presacral lymph node, possibly metastatic.      12/14/2015 Definitive Surgery    Rectum, resection, rectal mass by Dr. [name]      11/19/7586 Pathology Results    Rectum, resection, rectal mass MODERATELY DIFFERENTIATED RECTAL ADENOCARCINOMA (4.3 CM) THE TUMOR INVADES THROUGH THE MUSCULARIS PROPRIA INTO PERICOLORECTAL TISSUE MARGINS OF RESECTION IS POSITIVE      12/14/2015 Pathology Results    There is a very low probability that microsatellite instability (MSI) is present.      01/15/2016 Procedure    Colonoscopy by Dr. [name]      01/15/2016 Pathology Results    1. Colon, polyp(s), descending - TUBULAR ADENOMA. NO  HIGH GRADE DYSPLASIA OR MALIGNANCY IDENTIFIED. 2. Colon, polyp(s), splenic flexure - TUBULAR ADENOMA. NO HIGH GRADE DYSPLASIA OR MALIGNANCY IDENTIFIED. 3. Colon, polyp(s), ascending - TUBULAR ADENO      01/24/2016 Imaging    CT abd/pelvis- Nonspecific mild residual rectal wall thickening with associated mild perirectal fat stranding posteriorly in the mid to upper rectum (8-9 cm superior to the anal verge), which could represent postsurgical changes and/or residual tumor.       02/20/2016 - 04/02/2016 Chemotherapy    5FU CI with XRT      02/20/2016 - 04/02/2016 Radiation Therapy    Dr. Pablo Ledger      03/08/2016 Treatment Plan Change    5 FU dose reduction by 20% beginning on cycle #3.      04/02/2016 Procedure    PICC removed.      05/17/2016 Procedure    Flexible sigmoidoscopy by Dr. [name]      01/10/5497 Pathology Results    Colon, biopsy, resection margin - BENIGN COLONIC MUCOSA. - NO DYSPLASIA OR MALIGNANCY.      06/13/2016 -  Chemotherapy    Xeloda 1500 mg PO BID 7 days on and 7 days off x 4 months       Treatment Plan Change    Xeloda dose reduced to 1000 mg BID 7 days on and 7 days off on 07/17/2016.         She reports that the soles of her feet are sore at bedtime.  She  denies any palm complaints.  She notes that the sole soreness is bilateral.  She notes that it is limited to her toes and distal 1/3 of soles.  She denies any skin sores or peeling of skin.  She also reports increased fatigue.  She notes issues with insomnia, but has Restoril at home which is effective for her.  She denies grogginess the following AM as well.  Review of Systems  Constitutional: Positive for malaise/fatigue. Negative for chills, fever and weight loss.  HENT: Negative.   Eyes: Negative.   Respiratory: Negative.  Negative for cough and hemoptysis.   Cardiovascular: Negative.  Negative for chest pain.  Gastrointestinal: Negative.  Negative for constipation, diarrhea, nausea and  vomiting.  Genitourinary: Negative.   Musculoskeletal: Positive for joint pain (right hip pain without known trauma and negative plain films).  Skin: Negative.  Negative for rash.  Neurological: Negative.   Endo/Heme/Allergies: Negative.   Psychiatric/Behavioral: The patient has insomnia.     Past Medical History:  Diagnosis Date  . Arthritis   . Cataract   . Chronic bronchitis ([place])   . Chronic renal disease, stage 3, moderately decreased glomerular filtration rate (GFR) between 30-59 mL/min/1.73 square meter 02/21/2016  . Complication of anesthesia   . COPD (chronic obstructive pulmonary disease) ([place])   . GERD (gastroesophageal reflux disease)   . Hyperlipidemia   . Hypertension   . Hypothyroidism   . Murmur, heart   . PONV (postoperative nausea and vomiting)   . Rectal adenocarcinoma ([place])   . Thyroid disease     Past Surgical History:  Procedure Laterality Date  . ABDOMINAL HYSTERECTOMY     complete  . COLONOSCOPY N/A 11/01/2015   RMR: Large fungating rectal mass precluded colonoscopy. Status post biopsy.   . COLONOSCOPY N/A 01/15/2016   Procedure: COLONOSCOPY;  Surgeon: [name], MD;  Location: [facility];  Service: Endoscopy;  Laterality: N/A;  130   . FLEXIBLE SIGMOIDOSCOPY N/A 05/17/2016   Procedure: FLEXIBLE SIGMOIDOSCOPY;  Surgeon: [name], MD;  Location: [facility] ENDOSCOPY;  Service: Endoscopy;  Laterality: N/A;  . Left ear    . TRANSANAL ENDOSCOPIC MICROSURGERY N/A 12/14/2015   Procedure: TRANSANAL ENDOSCOPIC MICROSURGERY OF RECTAL POLYP;  Surgeon: [name], MD;  Location: [facility] ORS;  Service: General;  Laterality: N/A;    Family History  Problem Relation Age of Onset  . Kidney disease Mother     nephrectomy x 1  . COPD Brother   . Emphysema Brother   . Asthma Brother   . Cancer Brother 100    brain  . Colon cancer Neg Hx     Social History   Social History  . Marital status: Widowed    Spouse name: N/A  . Number of children: N/A  . Years  of education: N/A   Social History Main Topics  . Smoking status: Former Smoker    Quit date: 11/11/2010  . Smokeless tobacco: Never Used  . Alcohol use No  . Drug use: No  . Sexual activity: Not Asked   Other Topics Concern  . None   Social History Narrative  . None     PHYSICAL EXAMINATION  ECOG PERFORMANCE STATUS: 0 - Asymptomatic  Vitals:   07/05/16 1136  BP: (!) 174/90  Pulse: 65  Resp: 20  Temp: 98.3 F (36.8 C)    GENERAL:alert, no distress, well nourished, well developed, comfortable, cooperative, smiling and accompanied by her daughter. SKIN: skin color, texture, turgor are normal, no rashes or  significant lesions HEAD: Normocephalic, No masses, lesions, tenderness or abnormalities EYES: normal, EOMI, Conjunctiva are pink and non-injected EARS: External ears normal OROPHARYNX:lips, buccal mucosa, and tongue normal and mucous membranes are moist NECK: supple, trachea midline LYMPH:  no palpable lymphadenopathy BREAST:not examined LUNGS: CTA B/L without wheezing, rales, or rhonchi. HEART: regular rate & rhythm ABDOMEN:abdomen soft, non-tender and normal bowel sounds BACK: Back symmetric, no curvature. EXTREMITIES:less then 2 second capillary refill, no joint deformities, effusion, or inflammation, no skin discoloration, no cyanosis  NEURO: alert & oriented x 3 with fluent speech, no focal motor/sensory deficits, gait normal   LABORATORY DATA: CBC    Component Value Date/Time   WBC 4.4 07/05/2016 1050   RBC 3.42 (L) 07/05/2016 1050   HGB 11.0 (L) 07/05/2016 1050   HCT 33.2 (L) 07/05/2016 1050   HCT 33.1 (L) 11/07/2015 1255   PLT 149 (L) 07/05/2016 1050   PLT 248 11/07/2015 1255   MCV 97.1 07/05/2016 1050   MCV 87 11/07/2015 1255   MCH 32.2 07/05/2016 1050   MCHC 33.1 07/05/2016 1050   RDW 16.2 (H) 07/05/2016 1050   RDW 15.6 (H) 11/07/2015 1255   LYMPHSABS 0.4 (L) 07/05/2016 1050   LYMPHSABS 0.8 11/07/2015 1255   MONOABS 0.3 07/05/2016 1050    EOSABS 0.1 07/05/2016 1050   EOSABS 0.1 11/07/2015 1255   BASOSABS 0.0 07/05/2016 1050   BASOSABS 0.0 11/07/2015 1255      Chemistry      Component Value Date/Time   NA 139 07/05/2016 1050   NA 140 11/07/2015 1255   K 3.3 (L) 07/05/2016 1050   CL 101 07/05/2016 1050   CO2 27 07/05/2016 1050   BUN 21 (H) 07/05/2016 1050   BUN 14 11/07/2015 1255   CREATININE 1.63 (H) 07/05/2016 1050      Component Value Date/Time   CALCIUM 8.7 (L) 07/05/2016 1050   ALKPHOS 51 07/05/2016 1050   AST 33 07/05/2016 1050   ALT 24 07/05/2016 1050   BILITOT 0.6 07/05/2016 1050   BILITOT 0.2 12/29/2014 1155      Lab Results  Component Value Date   CEA 6.1 (H) 06/19/2016     PENDING LABS:   RADIOGRAPHIC STUDIES:  No results found.   PATHOLOGY:    ASSESSMENT AND PLAN:  Rectal adenocarcinoma ([facility]) Stage IIA (T3N0M0) rectal adenocarcinoma, S/P resection of rectal mass by Dr. Marcello Moores on 12/14/2015, now S/P concomitant chemoradiation finishing on 04/02/2016.  She has refused surgical intervention.  Now on adjuvant Xeloda 1500 mg PO BID 7 days on and 7 days off x 4 months beginning on 06/13/2016  Oncology history is updated.  Labs today: CBC diff, CMET.  I personally reviewed and went over laboratory results with the patient.  The results are noted within this dictation.  CBC is stable.  Hypokalemia is noted and therefore, K+ replacement therapy is increased to BID dosing.  She notes some soreness of her soles of feet.  No complaints of hands.  She notes that her feet hurt most at night.  She also reports increased fatigue.  As a result, will reduce the dose of Xeloda to 1000 mg BID 7 days on and 7 days off.  Additionally, she will be given an additional week break from therapy.  Therefore, she will restart her Xeloda on 07/17/2016.  She is educated on the role of adjuvant chemotherapy- to reduce the risk of recurrence.  She notes insomnia and has Resoril at home.  She notes that it is effective  for  her.  She takes 15 mg at HS.  Labs in 3 weeks: CBC diff, CMET.  Return in 3 weeks for follow-up.   ORDERS PLACED FOR THIS ENCOUNTER: Orders Placed This Encounter  Procedures  . CBC with Differential  . Comprehensive metabolic panel    MEDICATIONS PRESCRIBED THIS ENCOUNTER: Meds ordered this encounter  Medications  . DISCONTD: capecitabine (XELODA) 500 MG tablet    Sig: Take 2 tablets (1,000 mg total) by mouth 2 (two) times daily after a meal. Take 1000 mg BID 7 days on and 7 days off.    Dispense:  84 tablet    Refill:  1    Order Specific Question:   Supervising Provider    Answer:   Patrici Ranks [id number]  . capecitabine (XELODA) 500 MG tablet    Sig: Take 2 tablets (1,000 mg total) by mouth 2 (two) times daily after a meal. Take 1000 mg BID 7 days on and 7 days off.    Dispense:  56 tablet    Refill:  1    Order Specific Question:   Supervising Provider    Answer:   Patrici Ranks [id number]    THERAPY PLAN:  Continue adjuvant Xeloda therapy with dose reduction as described above.  All questions were answered. The patient knows to call the clinic with any problems, questions or concerns. We can certainly see the patient much sooner if necessary.  Patient and plan discussed with Dr. Ancil Linsey and she is in agreement with the aforementioned.   This note is electronically signed by: Robynn Pane, PA-C 07/06/2016 10:00 AM

## 2016-07-06 NOTE — Assessment & Plan Note (Addendum)
Stage IIA (T3N0M0) rectal adenocarcinoma, S/P resection of rectal mass by Dr. Marcello Moores on 12/14/2015, now S/P concomitant chemoradiation finishing on 04/02/2016.  She has refused surgical intervention.  Now on adjuvant Xeloda 1500 mg PO BID 7 days on and 7 days off x 4 months beginning on 06/13/2016  Oncology history is updated.  Labs today: CBC diff, CMET.  I personally reviewed and went over laboratory results with the patient.  The results are noted within this dictation.  CBC is stable.  Hypokalemia is noted and therefore, K+ replacement therapy is increased to BID dosing.  She notes some soreness of her soles of feet.  No complaints of hands.  She notes that her feet hurt most at night.  She also reports increased fatigue.  As a result, will reduce the dose of Xeloda to 1000 mg BID 7 days on and 7 days off.  Additionally, she will be given an additional week break from therapy.  Therefore, she will restart her Xeloda on 07/17/2016.  She is educated on the role of adjuvant chemotherapy- to reduce the risk of recurrence.  She notes insomnia and has Resoril at home.  She notes that it is effective for her.  She takes 15 mg at HS.  Labs in 3 weeks: CBC diff, CMET.  Return in 3 weeks for follow-up.

## 2016-07-17 ENCOUNTER — Other Ambulatory Visit (HOSPITAL_COMMUNITY): Payer: Self-pay | Admitting: Oncology

## 2016-07-17 ENCOUNTER — Other Ambulatory Visit: Payer: Self-pay | Admitting: Family Medicine

## 2016-07-17 ENCOUNTER — Other Ambulatory Visit: Payer: Self-pay | Admitting: Pharmacist

## 2016-07-17 DIAGNOSIS — C20 Malignant neoplasm of rectum: Secondary | ICD-10-CM

## 2016-07-17 MED ORDER — CAPECITABINE 500 MG PO TABS: 1000.0000 mg | ORAL_TABLET | Freq: Two times a day (BID) | ORAL | 1 refills | Status: DC

## 2016-07-19 ENCOUNTER — Other Ambulatory Visit (HOSPITAL_COMMUNITY): Payer: Medicare Other

## 2016-07-23 ENCOUNTER — Other Ambulatory Visit (HOSPITAL_COMMUNITY): Payer: Self-pay

## 2016-07-23 DIAGNOSIS — C20 Malignant neoplasm of rectum: Secondary | ICD-10-CM

## 2016-07-23 MED ORDER — TEMAZEPAM 15 MG PO CAPS: 15.0000 mg | ORAL_CAPSULE | Freq: Every evening | ORAL | 0 refills | Status: DC | PRN

## 2016-07-23 NOTE — Telephone Encounter (Signed)
Refill request received for temazepam. Chart checked and refilled.

## 2016-07-30 ENCOUNTER — Encounter (HOSPITAL_COMMUNITY): Payer: Self-pay | Admitting: Hematology & Oncology

## 2016-07-30 ENCOUNTER — Encounter (HOSPITAL_COMMUNITY): Payer: Medicare Other | Attending: Hematology & Oncology | Admitting: Hematology & Oncology

## 2016-07-30 ENCOUNTER — Encounter (HOSPITAL_COMMUNITY): Payer: Medicare Other

## 2016-07-30 VITALS — BP 146/82 | HR 77 | Temp 98.5°F | Resp 18 | Wt 123.5 lb

## 2016-07-30 DIAGNOSIS — J440 Chronic obstructive pulmonary disease with acute lower respiratory infection: Secondary | ICD-10-CM | POA: Diagnosis not present

## 2016-07-30 DIAGNOSIS — G620 Drug-induced polyneuropathy: Secondary | ICD-10-CM | POA: Diagnosis not present

## 2016-07-30 DIAGNOSIS — M25551 Pain in right hip: Secondary | ICD-10-CM | POA: Diagnosis not present

## 2016-07-30 DIAGNOSIS — E785 Hyperlipidemia, unspecified: Secondary | ICD-10-CM | POA: Insufficient documentation

## 2016-07-30 DIAGNOSIS — D649 Anemia, unspecified: Secondary | ICD-10-CM | POA: Insufficient documentation

## 2016-07-30 DIAGNOSIS — E039 Hypothyroidism, unspecified: Secondary | ICD-10-CM | POA: Diagnosis not present

## 2016-07-30 DIAGNOSIS — H269 Unspecified cataract: Secondary | ICD-10-CM | POA: Insufficient documentation

## 2016-07-30 DIAGNOSIS — R011 Cardiac murmur, unspecified: Secondary | ICD-10-CM | POA: Insufficient documentation

## 2016-07-30 DIAGNOSIS — J42 Unspecified chronic bronchitis: Secondary | ICD-10-CM | POA: Insufficient documentation

## 2016-07-30 DIAGNOSIS — L271 Localized skin eruption due to drugs and medicaments taken internally: Secondary | ICD-10-CM | POA: Insufficient documentation

## 2016-07-30 DIAGNOSIS — J219 Acute bronchiolitis, unspecified: Secondary | ICD-10-CM | POA: Diagnosis not present

## 2016-07-30 DIAGNOSIS — C20 Malignant neoplasm of rectum: Secondary | ICD-10-CM

## 2016-07-30 DIAGNOSIS — M1990 Unspecified osteoarthritis, unspecified site: Secondary | ICD-10-CM | POA: Insufficient documentation

## 2016-07-30 DIAGNOSIS — Z87891 Personal history of nicotine dependence: Secondary | ICD-10-CM | POA: Insufficient documentation

## 2016-07-30 DIAGNOSIS — M25559 Pain in unspecified hip: Secondary | ICD-10-CM | POA: Insufficient documentation

## 2016-07-30 DIAGNOSIS — T451X5A Adverse effect of antineoplastic and immunosuppressive drugs, initial encounter: Secondary | ICD-10-CM

## 2016-07-30 DIAGNOSIS — N183 Chronic kidney disease, stage 3 (moderate): Secondary | ICD-10-CM | POA: Diagnosis not present

## 2016-07-30 DIAGNOSIS — K219 Gastro-esophageal reflux disease without esophagitis: Secondary | ICD-10-CM | POA: Diagnosis not present

## 2016-07-30 DIAGNOSIS — R197 Diarrhea, unspecified: Secondary | ICD-10-CM | POA: Diagnosis not present

## 2016-07-30 DIAGNOSIS — I129 Hypertensive chronic kidney disease with stage 1 through stage 4 chronic kidney disease, or unspecified chronic kidney disease: Secondary | ICD-10-CM | POA: Insufficient documentation

## 2016-07-30 LAB — CBC WITH DIFFERENTIAL/PLATELET
Basophils Absolute: 0 10*3/uL (ref 0.0–0.1)
Basophils Relative: 0 %
EOS ABS: 0.1 10*3/uL (ref 0.0–0.7)
EOS PCT: 1 %
HCT: 32.5 % — ABNORMAL LOW (ref 36.0–46.0)
Hemoglobin: 10.6 g/dL — ABNORMAL LOW (ref 12.0–15.0)
LYMPHS ABS: 0.4 10*3/uL — AB (ref 0.7–4.0)
LYMPHS PCT: 9 %
MCH: 32.8 pg (ref 26.0–34.0)
MCHC: 32.6 g/dL (ref 30.0–36.0)
MCV: 100.6 fL — ABNORMAL HIGH (ref 78.0–100.0)
MONOS PCT: 8 %
Monocytes Absolute: 0.4 10*3/uL (ref 0.1–1.0)
Neutro Abs: 4 10*3/uL (ref 1.7–7.7)
Neutrophils Relative %: 82 %
PLATELETS: 181 10*3/uL (ref 150–400)
RBC: 3.23 MIL/uL — AB (ref 3.87–5.11)
RDW: 19.1 % — ABNORMAL HIGH (ref 11.5–15.5)
WBC: 4.9 10*3/uL (ref 4.0–10.5)

## 2016-07-30 LAB — COMPREHENSIVE METABOLIC PANEL
ALK PHOS: 59 U/L (ref 38–126)
ALT: 18 U/L (ref 14–54)
ANION GAP: 11 (ref 5–15)
AST: 28 U/L (ref 15–41)
Albumin: 3.8 g/dL (ref 3.5–5.0)
BUN: 26 mg/dL — ABNORMAL HIGH (ref 6–20)
CALCIUM: 8.7 mg/dL — AB (ref 8.9–10.3)
CO2: 29 mmol/L (ref 22–32)
CREATININE: 1.83 mg/dL — AB (ref 0.44–1.00)
Chloride: 100 mmol/L — ABNORMAL LOW (ref 101–111)
GFR, EST AFRICAN AMERICAN: 29 mL/min — AB (ref >60)
GFR, EST NON AFRICAN AMERICAN: 25 mL/min — AB (ref >60)
Glucose, Bld: 96 mg/dL (ref 65–99)
Potassium: 3.2 mmol/L — ABNORMAL LOW (ref 3.5–5.1)
SODIUM: 140 mmol/L (ref 135–145)
Total Bilirubin: 0.5 mg/dL (ref 0.3–1.2)
Total Protein: 7 g/dL (ref 6.5–8.1)

## 2016-07-30 NOTE — Progress Notes (Signed)
[place] [facility] [place] Note  Patient Care Team: [name], MD as PCP - General [name], MD as Consulting Physician (Ophthalmology) [name], MD as Consulting Physician (Gastroenterology)  CHIEF COMPLAINTS/PURPOSE OF CONSULTATION:  Low rectal mass with high grade glandular dysplasia Severe constipation, unable to pass stool without stool softener Colonoscopy on 11/01/15 with Dr. [name] revealing large fungating rectal mass, precluded colonoscopy Weight loss CT Abdomen/Pelvis 11/01/15 with irregular annular thickening in mid rectum with rectal intussusception, 4 hypodense subcentimeter liver lesions too small to characterize, chronic infectious or inflammatory bronchiolitis at the lung bases COPD Transanal minimally invasive surgery with excision of rectal polyp with Dr. [name] on 05/19/1504 Final pathology MMR IHC normal, moderately differentiated rectal adenocarcinoma 4.3 cm, tumor invades through muscularis propria into pericolorectal tissue, margins of resection positive T3,NX,MX    Rectal adenocarcinoma ([place])   11/01/2015 Procedure    Colonoscopy by Dr. [name]      11/01/2015 Pathology Results    Rectum, biopsy, mass - FRAGMENTS OF TUBULAR ADENOMA WITH HIGH GRADE GLANDULAR DYSPLASIA.      11/07/2015 Imaging    CT abd/pelvis- Irregular annular wall thickening in the mid rectum with associated rectal intussusception, presumably representing a primary rectal neoplasm. Borderline prominent upper presacral lymph node, possibly metastatic.      12/14/2015 Definitive Surgery    Rectum, resection, rectal mass by Dr. [name]      04/20/7947 Pathology Results    Rectum, resection, rectal mass MODERATELY DIFFERENTIATED RECTAL ADENOCARCINOMA (4.3 CM) THE TUMOR INVADES THROUGH THE MUSCULARIS PROPRIA INTO PERICOLORECTAL TISSUE MARGINS OF RESECTION IS POSITIVE      12/14/2015 Pathology Results    There is a very low probability  that microsatellite instability (MSI) is present.      01/15/2016 Procedure    Colonoscopy by Dr. [name]      01/15/2016 Pathology Results    1. Colon, polyp(s), descending - TUBULAR ADENOMA. NO HIGH GRADE DYSPLASIA OR MALIGNANCY IDENTIFIED. 2. Colon, polyp(s), splenic flexure - TUBULAR ADENOMA. NO HIGH GRADE DYSPLASIA OR MALIGNANCY IDENTIFIED. 3. Colon, polyp(s), ascending - TUBULAR ADENO      01/24/2016 Imaging    CT abd/pelvis- Nonspecific mild residual rectal wall thickening with associated mild perirectal fat stranding posteriorly in the mid to upper rectum (8-9 cm superior to the anal verge), which could represent postsurgical changes and/or residual tumor.       02/20/2016 - 04/02/2016 Chemotherapy    5FU CI with XRT      02/20/2016 - 04/02/2016 Radiation Therapy    Dr. Pablo Ledger      03/08/2016 Treatment Plan Change    5 FU dose reduction by 20% beginning on cycle #3.      04/02/2016 Procedure    PICC removed.      05/17/2016 Procedure    Flexible sigmoidoscopy by Dr. [name]      0/11/6551 Pathology Results    Colon, biopsy, resection margin - BENIGN COLONIC MUCOSA. - NO DYSPLASIA OR MALIGNANCY.      06/13/2016 -  Chemotherapy    Xeloda 1500 mg PO BID 7 days on and 7 days off x 4 months       Treatment Plan Change    Xeloda dose reduced to 1000 mg BID 7 days on and 7 days off on 07/17/2016.          HISTORY OF PRESENTING ILLNESS:  Meghan Anderson 80 y.o. female is here for follow-up of her rectal carcinoma.  She has completed neoadjuvant chemo/xrt. She is currently on single agent XELODA. Surgeon is Dr. Marcello Moores.   Patient is accompanied by her daughter. She is experiencing pain in lower extremities and reports that her feet have hurt since her previous visit. Patient reports that the pain has improved some, but is still causing her discomfort. She experiences numbness/tingling when she stands and her toes turn blue. She also experiences intermittent numbness in her  fingertips. Meghan Anderson says she is otherwise feeling ok. She is breathing well and her bowels have been normal. Patient denies abdominal pain.  Meghan Anderson takes two Xeloda in the morning and two in the evening for seven days. She then takes the following week off. Patient was previously taking three in the morning and three in the evening but could not tolerate the pain and fatigue associated with that high of a dose. Patient is currently on her off week now and is due to start Xeloda again on Thursday.   Meghan Anderson daughter says her mother complains of hip pain frequently and asked if there is anything that can be done to alleviate the pain. She reports that right hip pain started immediately after she finished radiation, but now she is experiencing left hip pain. Patient needs a cane to move around. She has already had an xray which she says only revealed arthritis.   MEDICAL HISTORY:  Past Medical History:  Diagnosis Date  . Arthritis   . Cataract   . Chronic bronchitis ([place])   . Chronic renal disease, stage 3, moderately decreased glomerular filtration rate (GFR) between 30-59 mL/min/1.73 square meter 02/21/2016  . Complication of anesthesia   . COPD (chronic obstructive pulmonary disease) ([place])   . GERD (gastroesophageal reflux disease)   . Hyperlipidemia   . Hypertension   . Hypothyroidism   . Murmur, heart   . PONV (postoperative nausea and vomiting)   . Rectal adenocarcinoma ([place])   . Thyroid disease     SURGICAL HISTORY: Past Surgical History:  Procedure Laterality Date  . ABDOMINAL HYSTERECTOMY     complete  . COLONOSCOPY N/A 11/01/2015   RMR: Large fungating rectal mass precluded colonoscopy. Status post biopsy.   . COLONOSCOPY N/A 01/15/2016   Procedure: COLONOSCOPY;  Surgeon: [name], MD;  [facility];  Service: Endoscopy;  Laterality: N/A;  130   . FLEXIBLE SIGMOIDOSCOPY N/A 05/17/2016   Procedure: FLEXIBLE SIGMOIDOSCOPY;  Surgeon: [name], MD;   Location: [facility];  Service: Endoscopy;  Laterality: N/A;  . Left ear    . TRANSANAL ENDOSCOPIC MICROSURGERY N/A 12/14/2015   Procedure: TRANSANAL ENDOSCOPIC MICROSURGERY OF RECTAL POLYP;  Surgeon: [name], MD;  Location: [facility] ORS;  Service: General;  Laterality: N/A;    SOCIAL HISTORY: Social History   Social History  . Marital status: Widowed    Spouse name: N/A  . Number of children: N/A  . Years of education: N/A   Occupational History  . Not on file.   Social History Main Topics  . Smoking status: Former Smoker    Quit date: 11/11/2010  . Smokeless tobacco: Never Used  . Alcohol use No  . Drug use: No  . Sexual activity: Not on file   Other Topics Concern  . Not on file   Social History Narrative  . No narrative on file   Widowed for 38 years. 2 sons and 2 daughters. 9 grandchildren 64 great-grandchildren Ex smoker, quit about 2 years ago.  She enjoys painting and riding  her golf cart [place] the farm.  She is from this area.   FAMILY HISTORY: Family History  Problem Relation Age of Onset  . Kidney disease Mother     nephrectomy x 1  . COPD Brother   . Emphysema Brother   . Asthma Brother   . Cancer Brother 68    brain  . Colon cancer Neg Hx    indicated that her mother is deceased. She indicated that her father is deceased. She indicated that her sister is alive. She indicated that three of her six brothers are alive. She indicated that the status of her neg hx is unknown.    Mother deceased at 53 yo, congestive heart failure. Father deceased at 78 yo, lung troubles, gall stones, and congestive heart failure.  6 siblings. Only two living. Her baby brother and her twin brother are still living. One brother died of a brain tumor in his 75s. Lived with brain tumor for about 10 years. One brother had lung problems and died of a heart attack in his 49s. One brother had stomach cancer and died in his early 3s. One sister died at 55 yo of congestive  heart failure.  ALLERGIES:  is allergic to moxifloxacin hcl in nacl; levaquin [levofloxacin in d5w]; prednisone; and quinolones.  MEDICATIONS:  Current Outpatient Prescriptions  Medication Sig Dispense Refill  . albuterol (PROVENTIL) (2.5 MG/3ML) 0.083% nebulizer solution NEBULIZE 1 VIAL EVERY 4 HOURS AS NEEDED (Patient taking differently: NEBULIZE 1 VIAL EVERY 4 HOURS AS NEEDED FOR SHORTNESS OF BREATHE.) 150 mL 2  . amLODipine (NORVASC) 5 MG tablet Take 1 tablet (5 mg total) by mouth daily. 30 tablet 5  . atorvastatin (LIPITOR) 80 MG tablet Take 1 tablet (80 mg total) by mouth daily. (Patient taking differently: Take 40 mg by mouth daily at 12 noon. ) 90 tablet 3  . capecitabine (XELODA) 500 MG tablet Take 2 tablets (1,000 mg total) by mouth 2 (two) times daily after a meal. Take 1000 mg BID 7 days on and 7 days off. 56 tablet 1  . Fluticasone Furoate-Vilanterol (BREO ELLIPTA) 100-25 MCG/INH AEPB Reported on 05/29/2016    . levothyroxine (SYNTHROID, LEVOTHROID) 88 MCG tablet Take 1 tablet (88 mcg total) by mouth daily. 90 tablet 1  . lisinopril-hydrochlorothiazide (PRINZIDE,ZESTORETIC) 20-12.5 MG tablet Take 2 tablets by mouth daily. 60 tablet 3  . omeprazole (PRILOSEC) 40 MG capsule TAKE ONE (1) CAPSULE EACH DAY 30 capsule 0  . potassium chloride SA (K-DUR,KLOR-CON) 20 MEQ tablet TAKE ONE (1) TABLET EACH DAY 30 tablet 0  . temazepam (RESTORIL) 15 MG capsule Take 1 capsule (15 mg total) by mouth at bedtime as needed for sleep. 30 capsule 0  . tiotropium (SPIRIVA HANDIHALER) 18 MCG inhalation capsule Place 1 capsule (18 mcg total) into inhaler and inhale daily. 90 capsule 3   No current facility-administered medications for this visit.     Review of Systems  Constitutional: Negative for chills, fever, malaise/fatigue and weight loss.  HENT: Negative.  Negative for congestion, hearing loss, nosebleeds, sore throat and tinnitus.   Eyes: Negative.  Negative for blurred vision, double vision,  pain and discharge.  Respiratory: Negative for cough, hemoptysis, sputum production and wheezing.   Cardiovascular: Negative.  Negative for chest pain, palpitations, claudication, leg swelling and PND.  Gastrointestinal: Negative for abdominal pain, blood in stool, heartburn, melena, nausea and vomiting.  Genitourinary: Negative for hematuria and urgency.  Musculoskeletal: Positive for back pain. Negative for falls and myalgias.  Lower back/hip pain  Foot pain  Skin: Negative.  Negative for itching and rash.  Neurological: Positive for tingling. Negative for dizziness, tremors, sensory change, speech change, focal weakness, seizures, loss of consciousness, weakness and headaches.       Numbness/tingling in lower extremities and fingertips  Endo/Heme/Allergies: Negative.  Does not bruise/bleed easily.  Psychiatric/Behavioral: Negative.  Negative for depression, memory loss, substance abuse and suicidal ideas. The patient is not nervous/anxious and does not have insomnia.   All other systems reviewed and are negative.  14 point ROS was done and is otherwise as detailed above or in HPI   PHYSICAL EXAMINATION: ECOG PERFORMANCE STATUS: 1 - Symptomatic but completely ambulatory  Vitals with BMI 07/30/2016  Height   Weight 123 lbs 8 oz  BMI   Systolic 003  Diastolic 82  Pulse 77  Respirations 18    Physical Exam  Constitutional: She is oriented to person, place, and time and well-developed, well-nourished, and in no distress.  HENT:  Head: Normocephalic and atraumatic.  Nose: Nose normal.  Mouth/Throat: Oropharynx is clear and moist. No oropharyngeal exudate.  Eyes: Conjunctivae and EOM are normal. Pupils are equal, round, and reactive to light. Right eye exhibits no discharge. Left eye exhibits no discharge. No scleral icterus.  Neck: Normal range of motion. Neck supple. No JVD present. No tracheal deviation present. No thyromegaly present.  Cardiovascular: Normal rate, regular  rhythm and normal heart sounds.  Exam reveals no gallop and no friction rub.   No murmur heard. Pulmonary/Chest: Effort normal and breath sounds normal. She has no wheezes. She has no rales.  Abdominal: Soft. Bowel sounds are normal. She exhibits no distension and no mass. There is no tenderness. There is no rebound and no guarding.  Musculoskeletal: Normal range of motion.  Lymphadenopathy:    She has no cervical adenopathy.  Neurological: She is alert and oriented to person, place, and time. She has normal reflexes. No cranial nerve deficit. Gait normal. Coordination normal.  Skin: Skin is warm and dry. No rash noted.  Hands are dry with skin peeling at the fingertips. Feet with chronic skin changes (also noted on LE) No peeling, chronic erythema of feet  Psychiatric: Mood, memory, affect and judgment normal.  Nursing note and vitals reviewed.   PATHOLOGY:               LABORATORY DATA:  I have reviewed the data as listed Lab Results  Component Value Date   WBC 4.9 07/30/2016   HGB 10.6 (L) 07/30/2016   HCT 32.5 (L) 07/30/2016   MCV 100.6 (H) 07/30/2016   PLT 181 07/30/2016   CMP     Component Value Date/Time   NA 140 07/30/2016 1044   NA 140 11/07/2015 1255   K 3.2 (L) 07/30/2016 1044   CL 100 (L) 07/30/2016 1044   CO2 29 07/30/2016 1044   GLUCOSE 96 07/30/2016 1044   BUN 26 (H) 07/30/2016 1044   BUN 14 11/07/2015 1255   CREATININE 1.83 (H) 07/30/2016 1044   CALCIUM 8.7 (L) 07/30/2016 1044   PROT 7.0 07/30/2016 1044   PROT 5.8 (L) 12/29/2014 1155   ALBUMIN 3.8 07/30/2016 1044   ALBUMIN 3.5 12/29/2014 1155   AST 28 07/30/2016 1044   ALT 18 07/30/2016 1044   ALKPHOS 59 07/30/2016 1044   BILITOT 0.5 07/30/2016 1044   BILITOT 0.2 12/29/2014 1155   GFRNONAA 25 (L) 07/30/2016 1044   GFRAA 29 (L) 07/30/2016 1044    RADIOGRAPHIC STUDIES:  I have personally reviewed the radiological images as listed and agreed with the findings in the report. Study Result     CLINICAL DATA:  Generalized right hip pain with limping for the past month ; concluded radiation therapy for rectal cancer 1 month ago.  EXAM: DG HIP (WITH OR WITHOUT PELVIS) 2-3V RIGHT  COMPARISON:  Abdominal and pelvic CT scan of May 22, 2016  FINDINGS: The bones are subjectively osteopenic. There is mild symmetric narrowing of the right hip joint space. The articular surfaces of the right femoral head and acetabulum remains smoothly rounded. The femoral neck, intertrochanteric, and subtrochanteric regions are normal. No lytic or blastic lesion of the pelvis is observed.  Incidental note is made of moderate to severe degenerative change of the left hip joint. There degenerative changes of the lumbar spine.  IMPRESSION: No acute or significant chronic bony abnormality of the right hip. Very mild symmetric hip joint space narrowing on the right.   Electronically Signed   By: David  Martinique M.D.   On: 05/29/2016 11:22    ASSESSMENT & PLAN:  T3 rectal carcinoma Completion colonoscopy on 01/15/2016 with multiple colonic polyps, tubular adenoma Near obstructing lesion, s/p transanal resection with Dr. Marcello Moores Colonoscopy on 11/01/15 with Dr. [name] revealing large fungating rectal mass, precluded colonoscopy Weight loss CT Abdomen/Pelvis 11/01/15 with irregular annular thickening in mid rectum with rectal intussusception, 4 hypodense subcentimeter liver lesions too small to characterize, chronic infectious or inflammatory bronchiolitis at the lung bases COPD Anemia CKD, stage 3 CT pelvis 01/24/2016 with no pelvic LAD, no evidence of metastatic disease in the pelvis Concurrent 5-FU/XRT Chemotherapy induced diarrhea Hand foot syndrome Chemotherapy induced neuropathy Hip pain  She has had recent problems with hand/foot syndrome, I am also concerned that she is having neuropathy from her XELODA. Her kidney function could certainly be increasing the toxicity of her XELODA.  She has just been dose reduced.   I recommended that we delay treatment this Thursday 08/01/2016 until next Thursday 08/08/2016. In the meantime, I recommended that she aggressively moisturize using eucerin or udder cream. Realistically if needed we may have to discontinue therapy if she continues to have ongoing toxicities.   Patient also reports pain in her sacrum. I will refer the patient to orthopedics for further evaluation.   I will follow up with the patient on 08/15/2016, a week after her next cycle, to reevaluate. All questions were answered. The patient knows to call the clinic with any problems, questions or concerns.  This document serves as a record of services personally performed by Ancil Linsey, MD. It was created on her behalf by Elmyra Ricks, a trained medical scribe. The creation of this record is based on the scribe's personal observations and the provider's statements to them. This document has been checked and approved by the attending provider.  I have reviewed the above documentation for accuracy and completeness, and I agree with the above.  This note was electronically signed.    Molli Hazard, MD   07/31/2016 6:18 PM

## 2016-07-30 NOTE — Patient Instructions (Addendum)
[place] at [facility] Discharge Instructions  RECOMMENDATIONS MADE BY THE CONSULTANT AND ANY TEST RESULTS WILL BE SENT TO YOUR REFERRING PHYSICIAN.  You saw Dr. Whitney Muse today. Chemo will not start this Thursday but will start on the 28th. Follow up with Dr. Whitney Muse with labs on 10/5. We are referring you to an orthopedic MD in [place] for your left hip.  Thank you for choosing [place] at [facility] to provide your oncology and hematology care.  To afford each patient quality time with our provider, please arrive at least 15 minutes before your scheduled appointment time.   Beginning January 23rd 2017 lab work for the [facility] will be done in the  Main lab at [facility] on 1st floor. If you have a lab appointment with the [facility] please come in thru the  Main Entrance and check in at the main information desk  You need to re-schedule your appointment should you arrive 10 or more minutes late.  We strive to give you quality time with our providers, and arriving late affects you and other patients whose appointments are after yours.  Also, if you no show three or more times for appointments you may be dismissed from the clinic at the providers discretion.     Again, thank you for choosing [facility].  Our hope is that these requests will decrease the amount of time that you wait before being seen by our physicians.       _____________________________________________________________  Should you have questions after your visit to [facility], please contact our office at [phone] between the hours of 8:30 a.m. and 4:30 p.m.  Voicemails left after 4:30 p.m. will not be returned until the following business day.  For prescription refill requests, have your pharmacy contact our office.         Resources For Cancer Patients and their Caregivers ? American Cancer Society: Can assist with  transportation, wigs, general needs, runs Look Good Feel Better.        [phone] ? Cancer Care: Provides financial assistance, online support groups, medication/co-pay assistance.  1-800-813-HOPE 979-844-3030) ? [place] Assists [place] cancer patients and their families through emotional , educational and financial support.  6094237531 ? [place] DSS Where to apply for food stamps, Medicaid and utility assistance. (551)096-8448 ? [facility]: Transportation to medical appointments. (518)252-1191 ? Social Security Administration: May apply for disability if have a Stage IV cancer. 4584276897 (419)678-4073 ? [employer], Disability and Transit Services: Assists with nutrition, care and transit needs. [place] Support Programs: @10RELATIVEDAYS @ > Cancer Support Group  2nd Tuesday of the month 1pm-2pm, Journey Room  > Creative Journey  3rd Tuesday of the month 1130am-1pm, Journey Room  > Look Good Feel Better  1st Wednesday of the month 10am-12 noon, Journey Room (Call [place] to register [phone])

## 2016-07-31 ENCOUNTER — Encounter (HOSPITAL_COMMUNITY): Payer: Self-pay | Admitting: Hematology & Oncology

## 2016-07-31 ENCOUNTER — Other Ambulatory Visit (HOSPITAL_COMMUNITY): Payer: Self-pay | Admitting: Pharmacist

## 2016-08-01 ENCOUNTER — Other Ambulatory Visit (HOSPITAL_COMMUNITY): Payer: Self-pay | Admitting: Emergency Medicine

## 2016-08-02 ENCOUNTER — Telehealth (HOSPITAL_COMMUNITY): Payer: Self-pay | Admitting: *Deleted

## 2016-08-02 NOTE — Telephone Encounter (Signed)
-----   Message from Baird Cancer, PA-C sent at 07/31/2016  5:15 PM EDT ----- Increase Kdur to BID dosing

## 2016-08-02 NOTE — Telephone Encounter (Signed)
Pt aware to start taking Kdur BID. Pt verbalized understanding.

## 2016-08-07 DIAGNOSIS — J449 Chronic obstructive pulmonary disease, unspecified: Secondary | ICD-10-CM | POA: Diagnosis not present

## 2016-08-07 DIAGNOSIS — Z23 Encounter for immunization: Secondary | ICD-10-CM | POA: Diagnosis not present

## 2016-08-07 DIAGNOSIS — I10 Essential (primary) hypertension: Secondary | ICD-10-CM | POA: Diagnosis not present

## 2016-08-07 DIAGNOSIS — J3089 Other allergic rhinitis: Secondary | ICD-10-CM | POA: Diagnosis not present

## 2016-08-07 DIAGNOSIS — R0600 Dyspnea, unspecified: Secondary | ICD-10-CM | POA: Diagnosis not present

## 2016-08-12 ENCOUNTER — Other Ambulatory Visit (HOSPITAL_COMMUNITY): Payer: Self-pay

## 2016-08-12 DIAGNOSIS — C20 Malignant neoplasm of rectum: Secondary | ICD-10-CM

## 2016-08-13 ENCOUNTER — Telehealth (HOSPITAL_COMMUNITY): Payer: Self-pay | Admitting: *Deleted

## 2016-08-13 ENCOUNTER — Encounter (HOSPITAL_COMMUNITY): Payer: Medicare Other

## 2016-08-13 ENCOUNTER — Encounter (HOSPITAL_COMMUNITY): Payer: Medicare Other | Attending: Hematology & Oncology | Admitting: Hematology & Oncology

## 2016-08-13 ENCOUNTER — Encounter (HOSPITAL_COMMUNITY): Payer: Self-pay | Admitting: Hematology & Oncology

## 2016-08-13 VITALS — BP 168/87 | HR 68 | Temp 98.0°F | Resp 18 | Wt 125.6 lb

## 2016-08-13 DIAGNOSIS — C20 Malignant neoplasm of rectum: Secondary | ICD-10-CM

## 2016-08-13 DIAGNOSIS — G620 Drug-induced polyneuropathy: Secondary | ICD-10-CM | POA: Diagnosis not present

## 2016-08-13 DIAGNOSIS — T451X5A Adverse effect of antineoplastic and immunosuppressive drugs, initial encounter: Secondary | ICD-10-CM

## 2016-08-13 DIAGNOSIS — J449 Chronic obstructive pulmonary disease, unspecified: Secondary | ICD-10-CM

## 2016-08-13 DIAGNOSIS — M25552 Pain in left hip: Secondary | ICD-10-CM

## 2016-08-13 DIAGNOSIS — L271 Localized skin eruption due to drugs and medicaments taken internally: Secondary | ICD-10-CM

## 2016-08-13 LAB — COMPREHENSIVE METABOLIC PANEL
ALBUMIN: 3.8 g/dL (ref 3.5–5.0)
ALK PHOS: 58 U/L (ref 38–126)
ALT: 21 U/L (ref 14–54)
AST: 34 U/L (ref 15–41)
Anion gap: 8 (ref 5–15)
BUN: 21 mg/dL — AB (ref 6–20)
CHLORIDE: 102 mmol/L (ref 101–111)
CO2: 29 mmol/L (ref 22–32)
CREATININE: 1.8 mg/dL — AB (ref 0.44–1.00)
Calcium: 8.8 mg/dL — ABNORMAL LOW (ref 8.9–10.3)
GFR calc Af Amer: 30 mL/min — ABNORMAL LOW (ref >60)
GFR calc non Af Amer: 26 mL/min — ABNORMAL LOW (ref >60)
GLUCOSE: 89 mg/dL (ref 65–99)
Potassium: 3.7 mmol/L (ref 3.5–5.1)
SODIUM: 139 mmol/L (ref 135–145)
Total Bilirubin: 0.6 mg/dL (ref 0.3–1.2)
Total Protein: 7 g/dL (ref 6.5–8.1)

## 2016-08-13 LAB — CBC WITH DIFFERENTIAL/PLATELET
BASOS PCT: 0 %
Basophils Absolute: 0 10*3/uL (ref 0.0–0.1)
Eosinophils Absolute: 0.1 10*3/uL (ref 0.0–0.7)
Eosinophils Relative: 1 %
HCT: 32.6 % — ABNORMAL LOW (ref 36.0–46.0)
Hemoglobin: 10.6 g/dL — ABNORMAL LOW (ref 12.0–15.0)
LYMPHS PCT: 8 %
Lymphs Abs: 0.4 10*3/uL — ABNORMAL LOW (ref 0.7–4.0)
MCH: 32.5 pg (ref 26.0–34.0)
MCHC: 32.5 g/dL (ref 30.0–36.0)
MCV: 100 fL (ref 78.0–100.0)
MONO ABS: 0.4 10*3/uL (ref 0.1–1.0)
MONOS PCT: 8 %
NEUTROS ABS: 4.2 10*3/uL (ref 1.7–7.7)
NEUTROS PCT: 83 %
PLATELETS: 189 10*3/uL (ref 150–400)
RBC: 3.26 MIL/uL — AB (ref 3.87–5.11)
RDW: 19.3 % — ABNORMAL HIGH (ref 11.5–15.5)
WBC: 5.1 10*3/uL (ref 4.0–10.5)

## 2016-08-13 NOTE — Telephone Encounter (Signed)
-----   Message from Baird Cancer, PA-C sent at 08/13/2016  4:27 PM EDT ----- Stable

## 2016-08-13 NOTE — Assessment & Plan Note (Signed)
Stable. She is followed by a pulmonologist.

## 2016-08-13 NOTE — Telephone Encounter (Signed)
Pt's daughter aware labs are stable.

## 2016-08-13 NOTE — Assessment & Plan Note (Addendum)
Stage IIA (T3N0M0) rectal adenocarcinoma, S/P resection of rectal mass by Dr. Marcello Moores on 12/14/2015, now S/P concomitant chemoradiation finishing on 04/02/2016.  She has refused surgical intervention.  Originally started on Xeloda 1500 mg PO BID 7 days on and 7 days off x 4 months beginning on 06/13/2016  She had noted soreness of her soles of feet.  No complaints of hands.   She had also reported increased fatigue.  As a result, herf Xeloda was reduced to 1000 mg BID 7 days on and 7 days off.  Additionally, she was given an additional week break from therapy.  She restarted her Xeloda on 07/17/2016. She was seen in consult on 9/19 and treatment was delayed X 1 weeks given complaints of hand/foot numbness. She was advised to moisturize liberally.   Labs today: CBC diff, CMET.  I personally reviewed and went over laboratory results with the patient.  The results are noted within this dictation.  CBC is stable.    Given multiple dose interruptions, dose reductions and ongoing difficulties with her hands/feet, development of neuropathy she has opted to discontinue therapy. She does understand the role of adjuvant XELODA but she and her daughter note that she has not felt well in some time.   Labs in 4 weeks: CBC diff, CMET.  Return in 4 weeks for follow-up.

## 2016-08-13 NOTE — Assessment & Plan Note (Addendum)
She notes that her neuropathy symptoms are worse at night. She feels that they do worsen with each cycle. She admits that she would prefer to stop therapy as her fingers and toes and numb. At times she cannot feel things with her fingertips. Symptoms should resolve off therapy. Will monitor moving forward.

## 2016-08-13 NOTE — Patient Instructions (Addendum)
[place] at [facility] Discharge Instructions  RECOMMENDATIONS MADE BY THE CONSULTANT AND ANY TEST RESULTS WILL BE SENT TO YOUR REFERRING PHYSICIAN.  You saw Dr. Whitney Muse today. Stop taking Xeloda Follow up in 1 month with lab work.  Thank you for choosing [place] at [facility] to provide your oncology and hematology care.  To afford each patient quality time with our provider, please arrive at least 15 minutes before your scheduled appointment time.   Beginning January 23rd 2017 lab work for the [facility] will be done in the  Main lab at [facility] on 1st floor. If you have a lab appointment with the [facility] please come in thru the  Main Entrance and check in at the main information desk  You need to re-schedule your appointment should you arrive 10 or more minutes late.  We strive to give you quality time with our providers, and arriving late affects you and other patients whose appointments are after yours.  Also, if you no show three or more times for appointments you may be dismissed from the clinic at the providers discretion.     Again, thank you for choosing [facility].  Our hope is that these requests will decrease the amount of time that you wait before being seen by our physicians.       _____________________________________________________________  Should you have questions after your visit to [facility], please contact our office at [phone] between the hours of 8:30 a.m. and 4:30 p.m.  Voicemails left after 4:30 p.m. will not be returned until the following business day.  For prescription refill requests, have your pharmacy contact our office.         Resources For Cancer Patients and their Caregivers ? American Cancer Society: Can assist with transportation, wigs, general needs, runs Look Good Feel Better.        [phone] ? Cancer Care: Provides financial assistance,  online support groups, medication/co-pay assistance.  1-800-813-HOPE 727-863-4208) ? [facility] Assists [place] cancer patients and their families through emotional , educational and financial support.  850-232-8976 ? [place] DSS Where to apply for food stamps, Medicaid and utility assistance. (440)303-8956 ? [facility]: Transportation to medical appointments. 9191668651 ? Social Security Administration: May apply for disability if have a Stage IV cancer. 670 047 6835 (586)509-9577 ? [employer], Disability and Transit Services: Assists with nutrition, care and transit needs. [place] Support Programs: @10RELATIVEDAYS @ > Cancer Support Group  2nd Tuesday of the month 1pm-2pm, Journey Room  > Creative Journey  3rd Tuesday of the month 1130am-1pm, Journey Room  > Look Good Feel Better  1st Wednesday of the month 10am-12 noon, Journey Room (Call [place] to register [phone])

## 2016-08-13 NOTE — Progress Notes (Signed)
Meghan Anderson Note  Patient Care Team: Meghan Rancher, MD as PCP - General Meghan Mutter, MD as Consulting Physician (Ophthalmology) Meghan Dolin, MD as Consulting Physician (Gastroenterology)  CHIEF COMPLAINTS/PURPOSE OF CONSULTATION:  Low rectal mass with high grade glandular dysplasia Severe constipation, unable to pass stool without stool softener Colonoscopy on 11/01/15 with Dr. [name] revealing large fungating rectal mass, precluded colonoscopy Weight loss CT Abdomen/Pelvis 11/01/15 with irregular annular thickening in mid rectum with rectal intussusception, 4 hypodense subcentimeter liver lesions too small to characterize, chronic infectious or inflammatory bronchiolitis at the lung bases COPD Transanal minimally invasive surgery with excision of rectal polyp with Dr. [name] on 07/13/1193 Final pathology MMR IHC normal, moderately differentiated rectal adenocarcinoma 4.3 cm, tumor invades through muscularis propria into pericolorectal tissue, margins of resection positive T3,NX,MX    Rectal adenocarcinoma ([place])   11/01/2015 Procedure    Colonoscopy by Dr. [name]      11/01/2015 Pathology Results    Rectum, biopsy, mass - FRAGMENTS OF TUBULAR ADENOMA WITH HIGH GRADE GLANDULAR DYSPLASIA.      11/07/2015 Imaging    CT abd/pelvis- Irregular annular wall thickening in the mid rectum with associated rectal intussusception, presumably representing a primary rectal neoplasm. Borderline prominent upper presacral lymph node, possibly metastatic.      12/14/2015 Definitive Surgery    Rectum, resection, rectal mass by Dr. [name]      11/18/4079 Pathology Results    Rectum, resection, rectal mass MODERATELY DIFFERENTIATED RECTAL ADENOCARCINOMA (4.3 CM) THE TUMOR INVADES THROUGH THE MUSCULARIS PROPRIA INTO PERICOLORECTAL TISSUE MARGINS OF RESECTION IS POSITIVE      12/14/2015 Pathology Results    There is a very low probability  that microsatellite instability (MSI) is present.      01/15/2016 Procedure    Colonoscopy by Dr. [name]      01/15/2016 Pathology Results    1. Colon, polyp(s), descending - TUBULAR ADENOMA. NO HIGH GRADE DYSPLASIA OR MALIGNANCY IDENTIFIED. 2. Colon, polyp(s), splenic flexure - TUBULAR ADENOMA. NO HIGH GRADE DYSPLASIA OR MALIGNANCY IDENTIFIED. 3. Colon, polyp(s), ascending - TUBULAR ADENO      01/24/2016 Imaging    CT abd/pelvis- Nonspecific mild residual rectal wall thickening with associated mild perirectal fat stranding posteriorly in the mid to upper rectum (8-9 cm superior to the anal verge), which could represent postsurgical changes and/or residual tumor.       02/20/2016 - 04/02/2016 Chemotherapy    5FU CI with XRT      02/20/2016 - 04/02/2016 Radiation Therapy    Dr. Pablo Ledger      03/08/2016 Treatment Plan Change    5 FU dose reduction by 20% beginning on cycle #3.      04/02/2016 Procedure    PICC removed.      05/17/2016 Procedure    Flexible sigmoidoscopy by Dr. [name]      02/13/8184 Pathology Results    Colon, biopsy, resection margin - BENIGN COLONIC MUCOSA. - NO DYSPLASIA OR MALIGNANCY.      06/13/2016 -  Chemotherapy    Xeloda 1500 mg PO BID 7 days on and 7 days off x 4 months       Treatment Plan Change    Xeloda dose reduced to 1000 mg BID 7 days on and 7 days off on 07/17/2016.          HISTORY OF PRESENTING ILLNESS:  Meghan Anderson 80 y.o. female is here for follow-up of her rectal carcinoma.  Meghan Anderson foot pain has improved somewhat. However, she says she still experiences neuropathy in her hands and lower extremities. Patient states neuropathy feels worse in her hands when she lays down. Her left hand is worse than her right hand. Meghan Anderson reports increased neuropathy from cycle to cycle. She is also still experiencing pain in left hip and sacrum and has not heard anything yet from orthodpedic. Patient denies any other issues.  Meghan Anderson  daughter says her mother feels bad nearly all the time.   Patient says food does not taste good, but she has been eating "some."   When I asked the patient if she would like to stop the treatment, she said yes. However, she says would like to finish the next 2 days left of this cycle.   MEDICAL HISTORY:  Past Medical History:  Diagnosis Date  . Arthritis   . Cataract   . Chronic bronchitis ([place])   . Chronic renal disease, stage 3, moderately decreased glomerular filtration rate (GFR) between 30-59 mL/min/1.73 square meter 02/21/2016  . Complication of anesthesia   . COPD (chronic obstructive pulmonary disease) ([place])   . GERD (gastroesophageal reflux disease)   . Hyperlipidemia   . Hypertension   . Hypothyroidism   . Murmur, heart   . PONV (postoperative nausea and vomiting)   . Rectal adenocarcinoma ([place])   . Thyroid disease     SURGICAL HISTORY: Past Surgical History:  Procedure Laterality Date  . ABDOMINAL HYSTERECTOMY     complete  . COLONOSCOPY N/A 11/01/2015   RMR: Large fungating rectal mass precluded colonoscopy. Status post biopsy.   . COLONOSCOPY N/A 01/15/2016   Procedure: COLONOSCOPY;  Surgeon: Meghan Dolin, MD;  Location: [facility] SUITE;  Service: Endoscopy;  Laterality: N/A;  130   . FLEXIBLE SIGMOIDOSCOPY N/A 05/17/2016   Procedure: FLEXIBLE SIGMOIDOSCOPY;  Surgeon: [name], MD;  Location: [facility] ENDOSCOPY;  Service: Endoscopy;  Laterality: N/A;  . Left ear    . TRANSANAL ENDOSCOPIC MICROSURGERY N/A 12/14/2015   Procedure: TRANSANAL ENDOSCOPIC MICROSURGERY OF RECTAL POLYP;  Surgeon: [name], MD;  Location: [facility] ORS;  Service: General;  Laterality: N/A;    SOCIAL HISTORY: Social History   Social History  . Marital status: Widowed    Spouse name: N/A  . Number of children: N/A  . Years of education: N/A   Occupational History  . Not on file.   Social History Main Topics  . Smoking status: Former Smoker    Quit date: 11/11/2010  . Smokeless tobacco:  Never Used  . Alcohol use No  . Drug use: No  . Sexual activity: Not on file   Other Topics Concern  . Not on file   Social History Narrative  . No narrative on file   Widowed for 38 years. 2 sons and 2 daughters. 9 grandchildren 19 great-grandchildren Ex smoker, quit about 2 years ago.  She enjoys painting and riding her golf cart [place] the farm.  She is from this area.   FAMILY HISTORY: Family History  Problem Relation Age of Onset  . Kidney disease Mother     nephrectomy x 1  . COPD Brother   . Emphysema Brother   . Asthma Brother   . Cancer Brother 16    brain  . Colon cancer Neg Hx    indicated that her mother is deceased. She indicated that her father is deceased. She indicated that her sister is alive. She indicated that three of her six brothers  are alive. She indicated that the status of her neg hx is unknown.    Mother deceased at 65 yo, congestive heart failure. Father deceased at 49 yo, lung troubles, gall stones, and congestive heart failure.  6 siblings. Only two living. Her baby brother and her twin brother are still living. One brother died of a brain tumor in his 79s. Lived with brain tumor for about 10 years. One brother had lung problems and died of a heart attack in his 59s. One brother had stomach cancer and died in his early 19s. One sister died at 53 yo of congestive heart failure.  ALLERGIES:  is allergic to moxifloxacin hcl in nacl; levaquin [levofloxacin in d5w]; prednisone; and quinolones.  MEDICATIONS:  Current Outpatient Prescriptions  Medication Sig Dispense Refill  . albuterol (PROVENTIL) (2.5 MG/3ML) 0.083% nebulizer solution NEBULIZE 1 VIAL EVERY 4 HOURS AS NEEDED (Patient taking differently: NEBULIZE 1 VIAL EVERY 4 HOURS AS NEEDED FOR SHORTNESS OF BREATHE.) 150 mL 2  . amLODipine (NORVASC) 5 MG tablet Take 1 tablet (5 mg total) by mouth daily. 30 tablet 5  . atorvastatin (LIPITOR) 80 MG tablet Take 1 tablet (80 mg total) by mouth daily.  (Patient taking differently: Take 40 mg by mouth daily at 12 noon. ) 90 tablet 3  . capecitabine (XELODA) 500 MG tablet Take 2 tablets (1,000 mg total) by mouth 2 (two) times daily after a meal. Take 1000 mg BID 7 days on and 7 days off. 56 tablet 1  . Fluticasone Furoate-Vilanterol (BREO ELLIPTA) 100-25 MCG/INH AEPB Reported on 05/29/2016    . levothyroxine (SYNTHROID, LEVOTHROID) 88 MCG tablet Take 1 tablet (88 mcg total) by mouth daily. 90 tablet 1  . lisinopril-hydrochlorothiazide (PRINZIDE,ZESTORETIC) 20-12.5 MG tablet Take 2 tablets by mouth daily. 60 tablet 3  . omeprazole (PRILOSEC) 40 MG capsule TAKE ONE (1) CAPSULE EACH DAY 30 capsule 0  . potassium chloride SA (K-DUR,KLOR-CON) 20 MEQ tablet TAKE ONE (1) TABLET EACH DAY 30 tablet 0  . temazepam (RESTORIL) 15 MG capsule Take 1 capsule (15 mg total) by mouth at bedtime as needed for sleep. 30 capsule 0  . tiotropium (SPIRIVA HANDIHALER) 18 MCG inhalation capsule Place 1 capsule (18 mcg total) into inhaler and inhale daily. 90 capsule 3   No current facility-administered medications for this visit.     Review of Systems  Constitutional: Negative for chills, fever, malaise/fatigue and weight loss.  HENT: Negative.  Negative for congestion, hearing loss, nosebleeds, sore throat and tinnitus.   Eyes: Negative.  Negative for blurred vision, double vision, pain and discharge.  Respiratory: Negative for cough, hemoptysis, sputum production and wheezing.   Cardiovascular: Negative.  Negative for chest pain, palpitations, claudication, leg swelling and PND.  Gastrointestinal: Negative for abdominal pain, blood in stool, heartburn, melena, nausea and vomiting.  Genitourinary: Negative for hematuria and urgency.  Musculoskeletal: Positive for back pain. Negative for falls and myalgias.       Lower back/hip pain  Foot pain  Skin: Negative.  Negative for itching and rash.  Neurological: Positive for tingling. Negative for dizziness, tremors,  sensory change, speech change, focal weakness, seizures, loss of consciousness, weakness and headaches.       Numbness/tingling in lower extremities and fingertips  Endo/Heme/Allergies: Negative.  Does not bruise/bleed easily.  Psychiatric/Behavioral: Negative.  Negative for depression, memory loss, substance abuse and suicidal ideas. The patient is not nervous/anxious and does not have insomnia.   All other systems reviewed and are negative.  14 point ROS  was done and is otherwise as detailed above or in HPI   PHYSICAL EXAMINATION: ECOG PERFORMANCE STATUS: 1 - Symptomatic but completely ambulatory  Vitals with BMI 08/13/2016  Height   Weight 125 lbs 10 oz  BMI   Systolic 161  Diastolic 87  Pulse 68  Respirations 18    Physical Exam  Constitutional: She is oriented to person, place, and time and well-developed, well-nourished, and in no distress.  HENT:  Head: Normocephalic and atraumatic.  Nose: Nose normal.  Mouth/Throat: Oropharynx is clear and moist. No oropharyngeal exudate.  Eyes: Conjunctivae and EOM are normal. Pupils are equal, round, and reactive to light. Right eye exhibits no discharge. Left eye exhibits no discharge. No scleral icterus.  Neck: Normal range of motion. Neck supple. No JVD present. No tracheal deviation present. No thyromegaly present.  Cardiovascular: Normal rate, regular rhythm and normal heart sounds.  Exam reveals no gallop and no friction rub.   No murmur heard. Pulmonary/Chest: Effort normal and breath sounds normal. She has no wheezes. She has no rales.  Abdominal: Soft. Bowel sounds are normal. She exhibits no distension and no mass. There is no tenderness. There is no rebound and no guarding.  Musculoskeletal: Normal range of motion.  Lymphadenopathy:    She has no cervical adenopathy.  Neurological: She is alert and oriented to person, place, and time. She has normal reflexes. No cranial nerve deficit. Gait normal. Coordination normal.  Skin:  Skin is warm and dry. No rash noted.  Dryness and peeling on hands has improved. Feet with chronic skin changes (also noted on LE) No peeling, chronic erythema of feet  Psychiatric: Mood, memory, affect and judgment normal.  Nursing note and vitals reviewed.   PATHOLOGY:               LABORATORY DATA:  I have reviewed the data as listed Lab Results  Component Value Date   WBC 4.9 07/30/2016   HGB 10.6 (L) 07/30/2016   HCT 32.5 (L) 07/30/2016   MCV 100.6 (H) 07/30/2016   PLT 181 07/30/2016   CMP     Component Value Date/Time   NA 140 07/30/2016 1044   NA 140 11/07/2015 1255   K 3.2 (L) 07/30/2016 1044   CL 100 (L) 07/30/2016 1044   CO2 29 07/30/2016 1044   GLUCOSE 96 07/30/2016 1044   BUN 26 (H) 07/30/2016 1044   BUN 14 11/07/2015 1255   CREATININE 1.83 (H) 07/30/2016 1044   CALCIUM 8.7 (L) 07/30/2016 1044   PROT 7.0 07/30/2016 1044   PROT 5.8 (L) 12/29/2014 1155   ALBUMIN 3.8 07/30/2016 1044   ALBUMIN 3.5 12/29/2014 1155   AST 28 07/30/2016 1044   ALT 18 07/30/2016 1044   ALKPHOS 59 07/30/2016 1044   BILITOT 0.5 07/30/2016 1044   BILITOT 0.2 12/29/2014 1155   GFRNONAA 25 (L) 07/30/2016 1044   GFRAA 29 (L) 07/30/2016 1044    RADIOGRAPHIC STUDIES: I have personally reviewed the radiological images as listed and agreed with the findings in the report. Study Result   CLINICAL DATA:  Generalized right hip pain with limping for the past month ; concluded radiation therapy for rectal cancer 1 month ago.  EXAM: DG HIP (WITH OR WITHOUT PELVIS) 2-3V RIGHT  COMPARISON:  Abdominal and pelvic CT scan of May 22, 2016  FINDINGS: The bones are subjectively osteopenic. There is mild symmetric narrowing of the right hip joint space. The articular surfaces of the right femoral head and acetabulum remains smoothly  rounded. The femoral neck, intertrochanteric, and subtrochanteric regions are normal. No lytic or blastic lesion of the pelvis is  observed.  Incidental note is made of moderate to severe degenerative change of the left hip joint. There degenerative changes of the lumbar spine.  IMPRESSION: No acute or significant chronic bony abnormality of the right hip. Very mild symmetric hip joint space narrowing on the right.   Electronically Signed   By: David  Martinique M.D.   On: 05/29/2016 11:22    ASSESSMENT & PLAN:  T3 rectal carcinoma Completion colonoscopy on 01/15/2016 with multiple colonic polyps, tubular adenoma Near obstructing lesion, s/p transanal resection with Dr. Marcello Moores Colonoscopy on 11/01/15 with Dr. [name] revealing large fungating rectal mass, precluded colonoscopy Weight loss CT Abdomen/Pelvis 11/01/15 with irregular annular thickening in mid rectum with rectal intussusception, 4 hypodense subcentimeter liver lesions too small to characterize, chronic infectious or inflammatory bronchiolitis at the lung bases COPD Anemia CKD, stage 3 CT pelvis 01/24/2016 with no pelvic LAD, no evidence of metastatic disease in the pelvis Concurrent 5-FU/XRT Chemotherapy induced diarrhea Hand foot syndrome Chemotherapy induced neuropathy Hip pain  COPD (chronic obstructive pulmonary disease) (HCC) Stable. She is followed by a pulmonologist.   Rectal adenocarcinoma ([place]) Stage IIA (T3N0M0) rectal adenocarcinoma, S/P resection of rectal mass by Dr. Marcello Moores on 12/14/2015, now S/P concomitant chemoradiation finishing on 04/02/2016.  She has refused surgical intervention.  Originally started on Xeloda 1500 mg PO BID 7 days on and 7 days off x 4 months beginning on 06/13/2016  She had noted soreness of her soles of feet.  No complaints of hands.   She had also reported increased fatigue.  As a result, herf Xeloda was reduced to 1000 mg BID 7 days on and 7 days off.  Additionally, she was given an additional week break from therapy.  She restarted her Xeloda on 07/17/2016. She was seen in consult on 9/19 and treatment was delayed X  1 weeks given complaints of hand/foot numbness. She was advised to moisturize liberally.   Labs today: CBC diff, CMET.  I personally reviewed and went over laboratory results with the patient.  The results are noted within this dictation.  CBC is stable.    Given multiple dose interruptions, dose reductions and ongoing difficulties with her hands/feet, development of neuropathy she has opted to discontinue therapy. She does understand the role of adjuvant XELODA but she and her daughter note that she has not felt well in some time.   Labs in 4 weeks: CBC diff, CMET.  Return in 4 weeks for follow-up.  Left hip pain L hip pain and sacral pain persist. We have referred to orthopedics. She has not heard about an appointment. Will call today and follow-up.  Neuropathy due to chemotherapeutic drug [facility]) She notes that her neuropathy symptoms are worse at night. She feels that they do worsen with each cycle. She admits that she would prefer to stop therapy as her fingers and toes and numb. At times she cannot feel things with her fingertips. Symptoms should resolve off therapy. Will monitor moving forward.  All questions were answered. The patient knows to call the clinic with any problems, questions or concerns.  This document serves as a record of services personally performed by Ancil Linsey, MD. It was created on her behalf by Elmyra Ricks, a trained medical scribe. The creation of this record is based on the scribe's personal observations and the provider's statements to them. This document has been checked and approved by  the attending provider.  I have reviewed the above documentation for accuracy and completeness, and I agree with the above.  This note was electronically signed.    Molli Hazard, MD   08/13/2016 9:05 AM

## 2016-08-13 NOTE — Assessment & Plan Note (Signed)
L hip pain and sacral pain persist. We have referred to orthopedics. She has not heard about an appointment. Will call today and follow-up.

## 2016-08-14 LAB — CEA: CEA: 7.2 ng/mL — ABNORMAL HIGH (ref 0.0–4.7)

## 2016-08-21 ENCOUNTER — Other Ambulatory Visit: Payer: Self-pay | Admitting: Family Medicine

## 2016-08-22 ENCOUNTER — Encounter: Payer: Self-pay | Admitting: Orthopaedic Surgery

## 2016-08-22 ENCOUNTER — Ambulatory Visit (INDEPENDENT_AMBULATORY_CARE_PROVIDER_SITE_OTHER): Payer: Medicare Other | Admitting: Orthopaedic Surgery

## 2016-08-22 VITALS — BP 178/82 | HR 74 | Temp 96.8°F | Ht 62.5 in | Wt 126.0 lb

## 2016-08-22 DIAGNOSIS — C20 Malignant neoplasm of rectum: Secondary | ICD-10-CM

## 2016-08-22 DIAGNOSIS — G8929 Other chronic pain: Secondary | ICD-10-CM | POA: Diagnosis not present

## 2016-08-22 DIAGNOSIS — M25552 Pain in left hip: Secondary | ICD-10-CM

## 2016-08-22 MED ORDER — HYDROCODONE-ACETAMINOPHEN 7.5-325 MG PO TABS: ORAL_TABLET | ORAL | 0 refills | Status: DC

## 2016-08-22 NOTE — Progress Notes (Signed)
Subjective:  My left hip hurts bad    Patient ID: Meghan Anderson, female    DOB: 09-16-1936, 80 y.o.   MRN: [id number]  HPI She is seen at the request of Dr. Whitney Muse from [facility].  The patient is being actively treated for rectal adenocarcinoma by [facility].  The patient has long history of left hip pain that is getting much worse.  She has pain with weight bearing, with motion of the left hip now.  She has no trauma.  She has localized pain.  X-rays done in July show degenerative changes of the left hip with cyst formation.  She is not on any NSAID as she cannot take them. She is allergic to prednisone which causes marked rashes and itching.  She is using a cane on the right side.  She has some right hip pain and some back pain because of the change in gait due to the left hip pain.   Review of Systems  HENT: Negative for congestion.   Respiratory: Negative for cough and shortness of breath.   Cardiovascular: Negative for chest pain and leg swelling.  Endocrine: Positive for cold intolerance.  Musculoskeletal: Positive for arthralgias, back pain, gait problem and myalgias.  Allergic/Immunologic: Positive for environmental allergies.   Past Medical History:  Diagnosis Date  . Arthritis   . Cataract   . Chronic bronchitis ([place])   . Chronic renal disease, stage 3, moderately decreased glomerular filtration rate (GFR) between 30-59 mL/min/1.73 square meter 02/21/2016  . Complication of anesthesia   . COPD (chronic obstructive pulmonary disease) ([place])   . GERD (gastroesophageal reflux disease)   . Hyperlipidemia   . Hypertension   . Hypothyroidism   . Murmur, heart   . PONV (postoperative nausea and vomiting)   . Rectal adenocarcinoma ([place])   . Thyroid disease     Past Surgical History:  Procedure Laterality Date  . ABDOMINAL HYSTERECTOMY     complete  . COLONOSCOPY N/A 11/01/2015   RMR: Large fungating rectal mass precluded colonoscopy. Status post biopsy.   . COLONOSCOPY  N/A 01/15/2016   Procedure: COLONOSCOPY;  Surgeon: [name], MD;  [facility];  Service: Endoscopy;  Laterality: N/A;  130   . FLEXIBLE SIGMOIDOSCOPY N/A 05/17/2016   Procedure: FLEXIBLE SIGMOIDOSCOPY;  Surgeon: [name], MD;  Location: [facility];  Service: Endoscopy;  Laterality: N/A;  . Left ear    . TRANSANAL ENDOSCOPIC MICROSURGERY N/A 12/14/2015   Procedure: TRANSANAL ENDOSCOPIC MICROSURGERY OF RECTAL POLYP;  Surgeon: [name], MD;  Location: [facility];  Service: General;  Laterality: N/A;    Current Outpatient Prescriptions on File Prior to Visit  Medication Sig Dispense Refill  . albuterol (PROVENTIL) (2.5 MG/3ML) 0.083% nebulizer solution NEBULIZE 1 VIAL EVERY 4 HOURS AS NEEDED (Patient taking differently: NEBULIZE 1 VIAL EVERY 4 HOURS AS NEEDED FOR SHORTNESS OF BREATHE.) 150 mL 2  . amLODipine (NORVASC) 5 MG tablet Take 1 tablet (5 mg total) by mouth daily. 30 tablet 5  . atorvastatin (LIPITOR) 80 MG tablet Take 1 tablet (80 mg total) by mouth daily. (Patient taking differently: Take 40 mg by mouth daily at 12 noon. ) 90 tablet 3  . Fluticasone Furoate-Vilanterol (BREO ELLIPTA) 100-25 MCG/INH AEPB Reported on 05/29/2016    . levothyroxine (SYNTHROID, LEVOTHROID) 88 MCG tablet Take 1 tablet (88 mcg total) by mouth daily. 90 tablet 1  . lisinopril-hydrochlorothiazide (PRINZIDE,ZESTORETIC) 20-12.5 MG tablet Take 2 tablets by mouth daily. 60 tablet 3  . omeprazole (  PRILOSEC) 40 MG capsule TAKE ONE (1) CAPSULE EACH DAY 30 capsule 0  . potassium chloride SA (K-DUR,KLOR-CON) 20 MEQ tablet TAKE ONE (1) TABLET EACH DAY 30 tablet 3  . temazepam (RESTORIL) 15 MG capsule Take 1 capsule (15 mg total) by mouth at bedtime as needed for sleep. 30 capsule 0  . tiotropium (SPIRIVA HANDIHALER) 18 MCG inhalation capsule Place 1 capsule (18 mcg total) into inhaler and inhale daily. 90 capsule 3   No current facility-administered medications on file prior to visit.     Social  History   Social History  . Marital status: Widowed    Spouse name: N/A  . Number of children: N/A  . Years of education: N/A   Occupational History  . Not on file.   Social History Main Topics  . Smoking status: Former Smoker    Quit date: 11/11/2010  . Smokeless tobacco: Never Used  . Alcohol use No  . Drug use: No  . Sexual activity: Not on file   Other Topics Concern  . Not on file   Social History Narrative  . No narrative on file    Family History  Problem Relation Age of Onset  . Kidney disease Mother     nephrectomy x 1  . COPD Brother   . Emphysema Brother   . Asthma Brother   . Cancer Brother 19    brain  . Colon cancer Neg Hx     BP (!) 178/82   Pulse 74   Temp (!) 96.8 F (36 C)   Ht 5' 2.5" (1.588 m)   Wt 126 lb (57.2 kg)   BMI 22.68 kg/m      Objective:   Physical Exam  Constitutional: She is oriented to person, place, and time. She appears well-developed and well-nourished.  HENT:  Head: Normocephalic and atraumatic.  Eyes: Conjunctivae and EOM are normal. Pupils are equal, round, and reactive to light.  Neck: Normal range of motion. Neck supple.  Cardiovascular: Normal rate, regular rhythm and intact distal pulses.   Pulmonary/Chest: Effort normal.  Abdominal: Soft.  Musculoskeletal: She exhibits tenderness (She has pain of the left hip, decreased ROM more in internal and external rotation, flexion 90, extension 5, painful abduction, adduction.).  Neurological: She is alert and oriented to person, place, and time. She displays normal reflexes. No cranial nerve deficit. She exhibits normal muscle tone. Coordination normal.  Skin: Skin is warm and dry.  Psychiatric: She has a normal mood and affect. Her behavior is normal. Judgment and thought content normal.          Assessment & Plan:   Encounter Diagnoses  Name Primary?  . Chronic hip pain, left Yes  . Rectal adenocarcinoma ([place])    She cannot take NSAIDs and Prednisone.  She  has a "worn out hip" on the left that would normally be considered for a total joint replacement.  I will give pain medicine.  I will see her in two weeks.  Call if any problem.  Precautions discussed.  Electronically Signed Sanjuana Kava, MD 10/12/201711:10 AM

## 2016-09-02 ENCOUNTER — Other Ambulatory Visit (HOSPITAL_COMMUNITY): Payer: Self-pay | Admitting: Oncology

## 2016-09-02 DIAGNOSIS — C20 Malignant neoplasm of rectum: Secondary | ICD-10-CM

## 2016-09-02 MED ORDER — TEMAZEPAM 15 MG PO CAPS: 15.0000 mg | ORAL_CAPSULE | Freq: Every evening | ORAL | 2 refills | Status: DC | PRN

## 2016-09-03 ENCOUNTER — Other Ambulatory Visit: Payer: Self-pay | Admitting: Family Medicine

## 2016-09-05 ENCOUNTER — Ambulatory Visit (INDEPENDENT_AMBULATORY_CARE_PROVIDER_SITE_OTHER): Payer: Medicare Other | Admitting: Orthopaedic Surgery

## 2016-09-05 ENCOUNTER — Encounter: Payer: Self-pay | Admitting: Orthopaedic Surgery

## 2016-09-05 VITALS — BP 176/103 | HR 72 | Temp 97.9°F | Ht 62.5 in | Wt 125.0 lb

## 2016-09-05 DIAGNOSIS — G8929 Other chronic pain: Secondary | ICD-10-CM | POA: Diagnosis not present

## 2016-09-05 DIAGNOSIS — M25552 Pain in left hip: Secondary | ICD-10-CM | POA: Diagnosis not present

## 2016-09-05 DIAGNOSIS — C20 Malignant neoplasm of rectum: Secondary | ICD-10-CM | POA: Diagnosis not present

## 2016-09-05 MED ORDER — OXYCODONE-ACETAMINOPHEN 7.5-325 MG PO TABS: 1.0000 | ORAL_TABLET | ORAL | 0 refills | Status: DC | PRN

## 2016-09-05 NOTE — Progress Notes (Signed)
Patient [id number] Meghan Anderson, female DOB:05/27/1936, 80 y.o. [id number]  Chief Complaint  Patient presents with  . Follow-up    left hip pain    HPI  Meghan Anderson is a 80 y.o. female who has marked pain of the left hip secondary to marked degenerative changes.  She is being followed by the [facility] for rectal adenocarcinoma.  She cannot take prednisone due to marked rashes if she does.  She cannot take any NSAID secondary to GI problems.  She cannot have any surgery on the hip secondary to her being treated actively for her cancer.  The hydrocodone I gave her did not help.  I have placed a call into Dr. Whitney Muse for suggestions for stronger narcotic.   HPI  Body mass index is 22.5 kg/m.  ROS  Review of Systems  HENT: Negative for congestion.   Respiratory: Negative for cough and shortness of breath.   Cardiovascular: Negative for chest pain and leg swelling.  Endocrine: Positive for cold intolerance.  Musculoskeletal: Positive for arthralgias, back pain, gait problem and myalgias.  Allergic/Immunologic: Positive for environmental allergies.    Past Medical History:  Diagnosis Date  . Arthritis   . Cataract   . Chronic bronchitis ([place])   . Chronic renal disease, stage 3, moderately decreased glomerular filtration rate (GFR) between 30-59 mL/min/1.73 square meter 02/21/2016  . Complication of anesthesia   . COPD (chronic obstructive pulmonary disease) ([place])   . GERD (gastroesophageal reflux disease)   . Hyperlipidemia   . Hypertension   . Hypothyroidism   . Murmur, heart   . PONV (postoperative nausea and vomiting)   . Rectal adenocarcinoma ([place])   . Thyroid disease     Past Surgical History:  Procedure Laterality Date  . ABDOMINAL HYSTERECTOMY     complete  . COLONOSCOPY N/A 11/01/2015   RMR: Large fungating rectal mass precluded colonoscopy. Status post biopsy.   . COLONOSCOPY N/A 01/15/2016   Procedure: COLONOSCOPY;  Surgeon: [name], MD;   [facility];  Service: Endoscopy;  Laterality: N/A;  130   . FLEXIBLE SIGMOIDOSCOPY N/A 05/17/2016   Procedure: FLEXIBLE SIGMOIDOSCOPY;  Surgeon: [name], MD;  [facility];  Service: Endoscopy;  Laterality: N/A;  . Left ear    . TRANSANAL ENDOSCOPIC MICROSURGERY N/A 12/14/2015   Procedure: TRANSANAL ENDOSCOPIC MICROSURGERY OF RECTAL POLYP;  Surgeon: [name], MD;  Location[facility];  Service: General;  Laterality: N/A;    Family History  Problem Relation Age of Onset  . Kidney disease Mother     nephrectomy x 1  . COPD Brother   . Emphysema Brother   . Asthma Brother   . Cancer Brother 81    brain  . Colon cancer Neg Hx     Social History Social History  Substance Use Topics  . Smoking status: Former Smoker    Quit date: 11/11/2010  . Smokeless tobacco: Never Used  . Alcohol use No    Allergies  Allergen Reactions  . Moxifloxacin Hcl In Nacl Rash  . Levaquin [Levofloxacin In D5w]     "too strong couldn't take them."  . Prednisone Itching and Rash  . Quinolones Rash    Current Outpatient Prescriptions  Medication Sig Dispense Refill  . albuterol (PROVENTIL) (2.5 MG/3ML) 0.083% nebulizer solution NEBULIZE 1 VIAL EVERY 4 HOURS AS NEEDED (Patient taking differently: NEBULIZE 1 VIAL EVERY 4 HOURS AS NEEDED FOR SHORTNESS OF BREATHE.) 150 mL 2  . amLODipine (NORVASC) 5 MG tablet  Take 1 tablet (5 mg total) by mouth daily. 30 tablet 5  . atorvastatin (LIPITOR) 80 MG tablet Take 1 tablet (80 mg total) by mouth daily. (Patient taking differently: Take 40 mg by mouth daily at 12 noon. ) 90 tablet 3  . Fluticasone Furoate-Vilanterol (BREO ELLIPTA) 100-25 MCG/INH AEPB Reported on 05/29/2016    . HYDROcodone-acetaminophen (NORCO) 7.5-325 MG tablet One every four hours for pain as needed.  Do not drive car or operate machinery while taking this medicine.  Must last 14 days. 56 tablet 0  . levothyroxine (SYNTHROID, LEVOTHROID) 88 MCG tablet Take 1 tablet (88 mcg  total) by mouth daily. 90 tablet 1  . lisinopril-hydrochlorothiazide (PRINZIDE,ZESTORETIC) 20-12.5 MG tablet Take 2 tablets by mouth daily. 60 tablet 3  . omeprazole (PRILOSEC) 40 MG capsule TAKE ONE (1) CAPSULE EACH DAY 30 capsule 3  . potassium chloride SA (K-DUR,KLOR-CON) 20 MEQ tablet TAKE ONE (1) TABLET EACH DAY 30 tablet 3  . temazepam (RESTORIL) 15 MG capsule Take 1 capsule (15 mg total) by mouth at bedtime as needed for sleep. 30 capsule 2  . tiotropium (SPIRIVA HANDIHALER) 18 MCG inhalation capsule Place 1 capsule (18 mcg total) into inhaler and inhale daily. 90 capsule 3   No current facility-administered medications for this visit.      Physical Exam  Blood pressure (!) 176/103, pulse 72, temperature 97.9 F (36.6 C), height 5' 2.5" (1.588 m), weight 125 lb (56.7 kg).  Constitutional: overall normal hygiene, normal nutrition, well developed, normal grooming, normal body habitus. Assistive device:cane  Musculoskeletal: gait and station Limp left, muscle tone and strength are normal, no tremors or atrophy is present.  .  Neurological: coordination overall normal.  Deep tendon reflex/nerve stretch intact.  Sensation normal.  Cranial nerves II-XII intact.   Skin:   Normal overall no scars, lesions, ulcers or rashes. No psoriasis.  Psychiatric: Alert and oriented x 3.  Recent memory intact, remote memory unclear.  Normal mood and affect. Well groomed.  Good eye contact.  Cardiovascular: overall no swelling, no varicosities, no edema bilaterally, normal temperatures of the legs and arms, no clubbing, cyanosis and good capillary refill.  Lymphatic: palpation is normal.  She has decreased ROM of the left hip and pain with any movement.  She has a limp on the left and uses a cane.  She has no distal edema.  NV is intact.  The patient has been educated about the nature of the problem(s) and counseled on treatment options.  The patient appeared to understand what I have discussed and  is in agreement with it.  Encounter Diagnoses  Name Primary?  . Chronic hip pain, left Yes  . Rectal adenocarcinoma [facility])     PLAN Call if any problems.  Precautions discussed.  I will change to Percocet 7.5.  I have placed call to [facility] for advice concerning stronger narcotic if needed.   Return to clinic 1 month   Electronically Signed Sanjuana Kava, MD 10/26/201710:13 AM

## 2016-09-16 ENCOUNTER — Encounter (HOSPITAL_COMMUNITY): Payer: Medicare Other

## 2016-09-16 ENCOUNTER — Encounter (HOSPITAL_COMMUNITY): Payer: Medicare Other | Attending: Hematology & Oncology | Admitting: Hematology & Oncology

## 2016-09-16 VITALS — BP 161/76 | HR 67 | Temp 98.2°F | Resp 16 | Wt 132.8 lb

## 2016-09-16 DIAGNOSIS — M25552 Pain in left hip: Secondary | ICD-10-CM | POA: Diagnosis not present

## 2016-09-16 DIAGNOSIS — D7589 Other specified diseases of blood and blood-forming organs: Secondary | ICD-10-CM

## 2016-09-16 DIAGNOSIS — G620 Drug-induced polyneuropathy: Secondary | ICD-10-CM

## 2016-09-16 DIAGNOSIS — C20 Malignant neoplasm of rectum: Secondary | ICD-10-CM | POA: Insufficient documentation

## 2016-09-16 DIAGNOSIS — T451X5A Adverse effect of antineoplastic and immunosuppressive drugs, initial encounter: Secondary | ICD-10-CM

## 2016-09-16 DIAGNOSIS — K5900 Constipation, unspecified: Secondary | ICD-10-CM | POA: Diagnosis not present

## 2016-09-16 LAB — CBC WITH DIFFERENTIAL/PLATELET
BASOS ABS: 0 10*3/uL (ref 0.0–0.1)
Basophils Relative: 0 %
EOS PCT: 1 %
Eosinophils Absolute: 0.1 10*3/uL (ref 0.0–0.7)
HEMATOCRIT: 31.4 % — AB (ref 36.0–46.0)
Hemoglobin: 10.4 g/dL — ABNORMAL LOW (ref 12.0–15.0)
LYMPHS ABS: 0.5 10*3/uL — AB (ref 0.7–4.0)
LYMPHS PCT: 8 %
MCH: 33.3 pg (ref 26.0–34.0)
MCHC: 33.1 g/dL (ref 30.0–36.0)
MCV: 100.6 fL — AB (ref 78.0–100.0)
MONO ABS: 0.2 10*3/uL (ref 0.1–1.0)
Monocytes Relative: 4 %
NEUTROS ABS: 5.2 10*3/uL (ref 1.7–7.7)
Neutrophils Relative %: 87 %
Platelets: 244 10*3/uL (ref 150–400)
RBC: 3.12 MIL/uL — AB (ref 3.87–5.11)
RDW: 18.3 % — ABNORMAL HIGH (ref 11.5–15.5)
WBC: 6 10*3/uL (ref 4.0–10.5)

## 2016-09-16 LAB — COMPREHENSIVE METABOLIC PANEL
ALT: 24 U/L (ref 14–54)
AST: 33 U/L (ref 15–41)
Albumin: 3.6 g/dL (ref 3.5–5.0)
Alkaline Phosphatase: 53 U/L (ref 38–126)
Anion gap: 7 (ref 5–15)
BILIRUBIN TOTAL: 0.5 mg/dL (ref 0.3–1.2)
BUN: 25 mg/dL — AB (ref 6–20)
CALCIUM: 8.2 mg/dL — AB (ref 8.9–10.3)
CO2: 29 mmol/L (ref 22–32)
CREATININE: 1.71 mg/dL — AB (ref 0.44–1.00)
Chloride: 103 mmol/L (ref 101–111)
GFR, EST AFRICAN AMERICAN: 32 mL/min — AB (ref >60)
GFR, EST NON AFRICAN AMERICAN: 27 mL/min — AB (ref >60)
Glucose, Bld: 110 mg/dL — ABNORMAL HIGH (ref 65–99)
Potassium: 3.4 mmol/L — ABNORMAL LOW (ref 3.5–5.1)
Sodium: 139 mmol/L (ref 135–145)
TOTAL PROTEIN: 6.7 g/dL (ref 6.5–8.1)

## 2016-09-16 MED ORDER — METHYLPREDNISOLONE 4 MG PO TBPK: ORAL_TABLET | ORAL | 0 refills | Status: DC

## 2016-09-16 MED ORDER — FENTANYL 12 MCG/HR TD PT72: 12.5000 ug | MEDICATED_PATCH | TRANSDERMAL | 0 refills | Status: DC

## 2016-09-16 NOTE — Progress Notes (Signed)
[facility] Note  Patient Care Team: [name], MD as PCP - General [name], MD as Consulting Physician (Ophthalmology) [name], MD as Consulting Physician (Gastroenterology)  CHIEF COMPLAINTS/PURPOSE OF CONSULTATION:  Low rectal mass with high grade glandular dysplasia Severe constipation, unable to pass stool without stool softener Colonoscopy on 11/01/15 with Dr. [name] revealing large fungating rectal mass, precluded colonoscopy Weight loss CT Abdomen/Pelvis 11/01/15 with irregular annular thickening in mid rectum with rectal intussusception, 4 hypodense subcentimeter liver lesions too small to characterize, chronic infectious or inflammatory bronchiolitis at the lung bases COPD Transanal minimally invasive surgery with excision of rectal polyp with Dr. [name] on 05/19/1504 Final pathology MMR IHC normal, moderately differentiated rectal adenocarcinoma 4.3 cm, tumor invades through muscularis propria into pericolorectal tissue, margins of resection positive T3,NX,MX    Rectal adenocarcinoma ([place])   11/01/2015 Procedure    Colonoscopy by Dr. [name]      11/01/2015 Pathology Results    Rectum, biopsy, mass - FRAGMENTS OF TUBULAR ADENOMA WITH HIGH GRADE GLANDULAR DYSPLASIA.      11/07/2015 Imaging    CT abd/pelvis- Irregular annular wall thickening in the mid rectum with associated rectal intussusception, presumably representing a primary rectal neoplasm. Borderline prominent upper presacral lymph node, possibly metastatic.      12/14/2015 Definitive Surgery    Rectum, resection, rectal mass by Dr. [name]      04/20/7947 Pathology Results    Rectum, resection, rectal mass MODERATELY DIFFERENTIATED RECTAL ADENOCARCINOMA (4.3 CM) THE TUMOR INVADES THROUGH THE MUSCULARIS PROPRIA INTO PERICOLORECTAL TISSUE MARGINS OF RESECTION IS POSITIVE      12/14/2015 Pathology Results    There is a very low probability  that microsatellite instability (MSI) is present.      01/15/2016 Procedure    Colonoscopy by Dr. [name]      01/15/2016 Pathology Results    1. Colon, polyp(s), descending - TUBULAR ADENOMA. NO HIGH GRADE DYSPLASIA OR MALIGNANCY IDENTIFIED. 2. Colon, polyp(s), splenic flexure - TUBULAR ADENOMA. NO HIGH GRADE DYSPLASIA OR MALIGNANCY IDENTIFIED. 3. Colon, polyp(s), ascending - TUBULAR ADENO      01/24/2016 Imaging    CT abd/pelvis- Nonspecific mild residual rectal wall thickening with associated mild perirectal fat stranding posteriorly in the mid to upper rectum (8-9 cm superior to the anal verge), which could represent postsurgical changes and/or residual tumor.       02/20/2016 - 04/02/2016 Chemotherapy    5FU CI with XRT      02/20/2016 - 04/02/2016 Radiation Therapy    Dr. Pablo Ledger      03/08/2016 Treatment Plan Change    5 FU dose reduction by 20% beginning on cycle #3.      04/02/2016 Procedure    PICC removed.      05/17/2016 Procedure    Flexible sigmoidoscopy by Dr. [name]      0/11/6551 Pathology Results    Colon, biopsy, resection margin - BENIGN COLONIC MUCOSA. - NO DYSPLASIA OR MALIGNANCY.      06/13/2016 -  Chemotherapy    Xeloda 1500 mg PO BID 7 days on and 7 days off x 4 months       Treatment Plan Change    Xeloda dose reduced to 1000 mg BID 7 days on and 7 days off on 07/17/2016.          HISTORY OF PRESENTING ILLNESS:  Meghan Anderson Anderson 80 y.o. female is here for follow-up of her rectal carcinoma.  She discontinued XELODA at our last visit secondary to neuropathy. She notes that she thinks her neuropathy is better. Her feet are improved. Hands feel better. Her major issue is her hip. She has seen Dr. Luna Glasgow.   Meghan Anderson Anderson returns to the [place] today accompanied by her daughter. She uses a cane to ambulate. She is seen today to discuss pain management.   She tried oxycodone, however it made her sick and did not help with her pain. She becomes itchy  while taking prednisone, then if she takes Benadryl it keeps her up at night. She does not recall ever trying a Medrol dose pack. She has not increased her activity because of her hip pain. She uses a walker while at home. She actually has two walkers - one for upstairs and one for downstairs.   She reports constipation and gas secondary to pain medication. She has taken anything to help with this but plans on stopping by the store to get something on her way home today. She recalls having a constipation sheet from her chemotherapy teaching.   Appetite is good. No blood in her stool.  No nausea or vomiting.    MEDICAL HISTORY:  Past Medical History:  Diagnosis Date  . Arthritis   . Cataract   . Chronic bronchitis ([place])   . Chronic renal disease, stage 3, moderately decreased glomerular filtration rate (GFR) between 30-59 mL/min/1.73 square meter 02/21/2016  . Complication of anesthesia   . COPD (chronic obstructive pulmonary disease) ([place])   . GERD (gastroesophageal reflux disease)   . Hyperlipidemia   . Hypertension   . Hypothyroidism   . Murmur, heart   . PONV (postoperative nausea and vomiting)   . Rectal adenocarcinoma ([place])   . Thyroid disease     SURGICAL HISTORY: Past Surgical History:  Procedure Laterality Date  . ABDOMINAL HYSTERECTOMY     complete  . COLONOSCOPY N/A 11/01/2015   RMR: Large fungating rectal mass precluded colonoscopy. Status post biopsy.   . COLONOSCOPY N/A 01/15/2016   Procedure: COLONOSCOPY;  Surgeon: [name], MD;  Location[facility];  Service: Endoscopy;  Laterality: N/A;  130   . FLEXIBLE SIGMOIDOSCOPY N/A 05/17/2016   Procedure: FLEXIBLE SIGMOIDOSCOPY;  Surgeon: [name], MD;  Location: [facility] ENDOS[facility];  Service: Endoscopy;  Laterality: N/A;  . Left ear    . TRANSANAL ENDOSCOPIC MICROSURGERY N/A 12/14/2015   Procedure: TRANSANAL ENDOSCOPIC MICROSURGERY OF RECTAL POLYP;  Surgeon: [name], MD;  Location: [facility] ORS;  Service: General;   Laterality: N/A;    SOCIAL HISTORY: Social History   Social History  . Marital status: Widowed    Spouse name: N/A  . Number of children: N/A  . Years of education: N/A   Occupational History  . Not on file.   Social History Main Topics  . Smoking status: Former Smoker    Quit date: 11/11/2010  . Smokeless tobacco: Never Used  . Alcohol use No  . Drug use: No  . Sexual activity: Not on file   Other Topics Concern  . Not on file   Social History Narrative  . No narrative on file   Widowed for 38 years. 2 sons and 2 daughters. 9 grandchildren 36 great-grandchildren Ex smoker, quit about 2 years ago.  She enjoys painting and riding her golf cart [place] the farm.  She is from this area.   FAMILY HISTORY: Family History  Problem Relation Age of Onset  . Kidney disease Mother  nephrectomy x 1  . COPD Brother   . Emphysema Brother   . Asthma Brother   . Cancer Brother 94    brain  . Colon cancer Neg Hx    indicated that her mother is deceased. She indicated that her father is deceased. She indicated that her sister is alive. She indicated that three of her six brothers are alive. She indicated that the status of her neg hx is unknown.    Mother deceased at 57 yo, congestive heart failure. Father deceased at 18 yo, lung troubles, gall stones, and congestive heart failure.  6 siblings. Only two living. Her baby brother and her twin brother are still living. One brother died of a brain tumor in his 68s. Lived with brain tumor for about 10 years. One brother had lung problems and died of a heart attack in his 81s. One brother had stomach cancer and died in his early 55s. One sister died at 79 yo of congestive heart failure.  ALLERGIES:  is allergic to moxifloxacin hcl in nacl; levaquin [levofloxacin in d5w]; prednisone; and quinolones.  MEDICATIONS:  Current Outpatient Prescriptions  Medication Sig Dispense Refill  . albuterol (PROVENTIL) (2.5 MG/3ML) 0.083%  nebulizer solution NEBULIZE 1 VIAL EVERY 4 HOURS AS NEEDED (Patient taking differently: NEBULIZE 1 VIAL EVERY 4 HOURS AS NEEDED FOR SHORTNESS OF BREATHE.) 150 mL 2  . amLODipine (NORVASC) 5 MG tablet Take 1 tablet (5 mg total) by mouth daily. 30 tablet 5  . atorvastatin (LIPITOR) 80 MG tablet Take 1 tablet (80 mg total) by mouth daily. (Patient taking differently: Take 40 mg by mouth daily at 12 noon. ) 90 tablet 3  . Fluticasone Furoate-Vilanterol (BREO ELLIPTA) 100-25 MCG/INH AEPB Reported on 05/29/2016    . levothyroxine (SYNTHROID, LEVOTHROID) 88 MCG tablet Take 1 tablet (88 mcg total) by mouth daily. 90 tablet 1  . lisinopril-hydrochlorothiazide (PRINZIDE,ZESTORETIC) 20-12.5 MG tablet Take 2 tablets by mouth daily. 60 tablet 3  . omeprazole (PRILOSEC) 40 MG capsule TAKE ONE (1) CAPSULE EACH DAY 30 capsule 3  . oxyCODONE-acetaminophen (PERCOCET) 7.5-325 MG tablet Take 1 tablet by mouth every 4 (four) hours as needed for moderate pain or severe pain (Must last 30 days.Do not take and drive a car or operate machinery). 120 tablet 0  . potassium chloride SA (K-DUR,KLOR-CON) 20 MEQ tablet TAKE ONE (1) TABLET EACH DAY 30 tablet 3  . temazepam (RESTORIL) 15 MG capsule Take 1 capsule (15 mg total) by mouth at bedtime as needed for sleep. 30 capsule 2  . tiotropium (SPIRIVA HANDIHALER) 18 MCG inhalation capsule Place 1 capsule (18 mcg total) into inhaler and inhale daily. 90 capsule 3   No current facility-administered medications for this visit.     Review of Systems  Constitutional: Negative for chills, fever, malaise/fatigue and weight loss.  HENT: Negative.  Negative for congestion, hearing loss, nosebleeds, sore throat and tinnitus.   Eyes: Negative.  Negative for blurred vision, double vision, pain and discharge.  Respiratory: Negative for cough, hemoptysis, sputum production and wheezing.   Cardiovascular: Negative.  Negative for chest pain, palpitations, claudication, leg swelling and PND.    Gastrointestinal: Negative for abdominal pain, blood in stool, heartburn, melena, nausea and vomiting.  Genitourinary: Negative for hematuria and urgency.  Musculoskeletal: Positive for back pain and joint pain. Negative for falls and myalgias.       Lower back/hip pain   Skin: Negative.  Negative for itching and rash.  Neurological: Positive for tingling. Negative for dizziness, tremors,  sensory change, speech change, focal weakness, seizures, loss of consciousness, weakness and headaches.       Numbness/tingling in fingertips has improved. Numbness/tingling in toes is unchanged.  Endo/Heme/Allergies: Negative.  Does not bruise/bleed easily.  Psychiatric/Behavioral: Negative.  Negative for depression, memory loss, substance abuse and suicidal ideas. The patient is not nervous/anxious and does not have insomnia.   All other systems reviewed and are negative. 14 point ROS was done and is otherwise as detailed above or in HPI   PHYSICAL EXAMINATION: ECOG PERFORMANCE STATUS: 1 - Symptomatic but completely ambulatory  Vitals with BMI 09/16/2016  Height   Weight 132 lbs 13 oz  BMI   Systolic 161  Diastolic 76  Pulse 67  Respirations 16    Physical Exam  Constitutional: She is oriented to person, place, and time and well-developed, well-nourished, and in no distress.  HENT:  Head: Normocephalic and atraumatic.  Nose: Nose normal.  Mouth/Throat: Oropharynx is clear and moist. No oropharyngeal exudate.  Eyes: Conjunctivae and EOM are normal. Pupils are equal, round, and reactive to light. Right eye exhibits no discharge. Left eye exhibits no discharge. No scleral icterus.  Neck: Normal range of motion. Neck supple. No JVD present. No tracheal deviation present. No thyromegaly present.  Cardiovascular: Normal rate, regular rhythm and normal heart sounds.  Exam reveals no gallop and no friction rub.   No murmur heard. Pulmonary/Chest: Effort normal and breath sounds normal. She has no  wheezes. She has no rales.  Abdominal: Soft. Bowel sounds are normal. She exhibits no distension and no mass. There is no tenderness. There is no rebound and no guarding.  Musculoskeletal: Normal range of motion.  Lymphadenopathy:    She has no cervical adenopathy.  Neurological: She is alert and oriented to person, place, and time. She has normal reflexes. No cranial nerve deficit. Gait normal. Coordination normal.  Skin: Skin is warm and dry. No rash noted.  Psychiatric: Mood, memory, affect and judgment normal.  Nursing note and vitals reviewed.   PATHOLOGY:               LABORATORY DATA:  I have reviewed the data as listed Lab Results  Component Value Date   WBC 6.0 09/16/2016   HGB 10.4 (L) 09/16/2016   HCT 31.4 (L) 09/16/2016   MCV 100.6 (H) 09/16/2016   PLT 244 09/16/2016   CMP     Component Value Date/Time   NA 139 09/16/2016 1106   NA 140 11/07/2015 1255   K 3.4 (L) 09/16/2016 1106   CL 103 09/16/2016 1106   CO2 29 09/16/2016 1106   GLUCOSE 110 (H) 09/16/2016 1106   BUN 25 (H) 09/16/2016 1106   BUN 14 11/07/2015 1255   CREATININE 1.71 (H) 09/16/2016 1106   CALCIUM 8.2 (L) 09/16/2016 1106   PROT 6.7 09/16/2016 1106   PROT 5.8 (L) 12/29/2014 1155   ALBUMIN 3.6 09/16/2016 1106   ALBUMIN 3.5 12/29/2014 1155   AST 33 09/16/2016 1106   ALT 24 09/16/2016 1106   ALKPHOS 53 09/16/2016 1106   BILITOT 0.5 09/16/2016 1106   BILITOT 0.2 12/29/2014 1155   GFRNONAA 27 (L) 09/16/2016 1106   GFRAA 32 (L) 09/16/2016 1106    RADIOGRAPHIC STUDIES: I have personally reviewed the radiological images as listed and agreed with the findings in the report. Study Result   CLINICAL DATA:  Generalized right hip pain with limping for the past month ; concluded radiation therapy for rectal cancer 1 month ago.  EXAM:  DG HIP (WITH OR WITHOUT PELVIS) 2-3V RIGHT  COMPARISON:  Abdominal and pelvic CT scan of May 22, 2016  FINDINGS: The bones are subjectively  osteopenic. There is mild symmetric narrowing of the right hip joint space. The articular surfaces of the right femoral head and acetabulum remains smoothly rounded. The femoral neck, intertrochanteric, and subtrochanteric regions are normal. No lytic or blastic lesion of the pelvis is observed.  Incidental note is made of moderate to severe degenerative change of the left hip joint. There degenerative changes of the lumbar spine.  IMPRESSION: No acute or significant chronic bony abnormality of the right hip. Very mild symmetric hip joint space narrowing on the right.   Electronically Signed   By: David  Martinique M.D.   On: 05/29/2016 11:22    ASSESSMENT & PLAN:  T3 rectal carcinoma Completion colonoscopy on 01/15/2016 with multiple colonic polyps, tubular adenoma Near obstructing lesion, s/p transanal resection with Dr. Marcello Moores Colonoscopy on 11/01/15 with Dr. [name] revealing large fungating rectal mass, precluded colonoscopy Weight loss CT Abdomen/Pelvis 11/01/15 with irregular annular thickening in mid rectum with rectal intussusception, 4 hypodense subcentimeter liver lesions too small to characterize, chronic infectious or inflammatory bronchiolitis at the lung bases COPD Anemia CKD, stage 3 CT pelvis 01/24/2016 with no pelvic LAD, no evidence of metastatic disease in the pelvis Concurrent 5-FU/XRT Chemotherapy induced diarrhea Hand foot syndrome Chemotherapy induced neuropathy L Hip pain, OA  No problem-specific Assessment & Plan notes found for this encounter.  Labs reviewed, results are noted above. Macrocytosis may be secondary to XELODA. However would add B12 and folate to follow-up, would also be beneficial to rule out B12 deficiency as contributing to her neuropathy. CEA has never been WNL. Will continue to follow. Last imaging was in July. Results are noted above. She will need close observation. She will continue to follow with Dr. Marcello Moores as well.   She is seen  today to discuss hip pain management. With her kidney function she is not an ideal candidate for chronic anti-inflammatory drugs. She had adverse side effects while taking oxycodone, prednisone, and Benadryl.   I have prescribed Medrol dose pack and low-dose fentanyl patch for pain management. She was given a constipation care sheet. We will regroup after 1 1/2 weeks to see how she is tolerating fentanyl and if she is doing well, slowly escalate the dose.   She will return to see Kirby Crigler, PA-C in 1.5 to 2 weeks to assess pain management. Cancer surveillance plans will also be discussed at this next visit.  All questions were answered. The patient knows to call the clinic with any problems, questions or concerns.  This document serves as a record of services personally performed by Ancil Linsey, MD. It was created on her behalf by Arlyce Harman, a trained medical scribe. The creation of this record is based on the scribe's personal observations and the provider's statements to them. This document has been checked and approved by the attending provider.  I have reviewed the above documentation for accuracy and completeness, and I agree with the above.  This note was electronically signed.    Molli Hazard, MD   09/16/2016 12:16 PM

## 2016-09-16 NOTE — Patient Instructions (Signed)
[place] at [facility] Discharge Instructions  RECOMMENDATIONS MADE BY THE CONSULTANT AND ANY TEST RESULTS WILL BE SENT TO YOUR REFERRING PHYSICIAN.  You saw Dr.Penland today. Follow up in 1-2 weeks with Tom. Will refer you to [facility] for left hip. See Amy at checkout for appointments.  Thank you for choosing [place] at [facility] to provide your oncology and hematology care.  To afford each patient quality time with our provider, please arrive at least 15 minutes before your scheduled appointment time.   Beginning January 23rd 2017 lab work for the [facility] will be done in the  Main lab at [facility] on 1st floor. If you have a lab appointment with the [facility] please come in thru the  Main Entrance and check in at the main information desk  You need to re-schedule your appointment should you arrive 10 or more minutes late.  We strive to give you quality time with our providers, and arriving late affects you and other patients whose appointments are after yours.  Also, if you no show three or more times for appointments you may be dismissed from the clinic at the providers discretion.     Again, thank you for choosing [facility].  Our hope is that these requests will decrease the amount of time that you wait before being seen by our physicians.       _____________________________________________________________  Should you have questions after your visit to [facility], please contact our office at [phone] between the hours of 8:30 a.m. and 4:30 p.m.  Voicemails left after 4:30 p.m. will not be returned until the following business day.  For prescription refill requests, have your pharmacy contact our office.         Resources For Cancer Patients and their Caregivers ? American Cancer Society: Can assist with transportation, wigs, general needs, runs Look Good Feel Better.         902-016-8290 ? Cancer Care: Provides financial assistance, online support groups, medication/co-pay assistance.  1-800-813-HOPE 602 737 7616) ? [place] Assists [place] cancer patients and their families through emotional , educational and financial support.  2265666415 ? [place] DSS Where to apply for food stamps, Medicaid and utility assistance. 845-444-7395 ? [facility]: Transportation to medical appointments. 778-556-2224 ? Social Security Administration: May apply for disability if have a Stage IV cancer. 947-223-2720 4172229119 ? [employer], Disability and Transit Services: Assists with nutrition, care and transit needs. [place] Support Programs: @10RELATIVEDAYS @ > Cancer Support Group  2nd Tuesday of the month 1pm-2pm, Journey Room  > Creative Journey  3rd Tuesday of the month 1130am-1pm, Journey Room  > Look Good Feel Better  1st Wednesday of the month 10am-12 noon, Journey Room (Call [place] to register [phone])   Constipation, Adult Constipation is when a person has fewer than three bowel movements a week, has difficulty having a bowel movement, or has stools that are dry, hard, or larger than normal. As people grow older, constipation is more common. A low-fiber diet, not taking in enough fluids, and taking certain medicines may make constipation worse.  CAUSES   Certain medicines, such as antidepressants, pain medicine, iron supplements, antacids, and water pills.   Certain diseases, such as diabetes, irritable bowel syndrome (IBS), thyroid disease, or depression.   Not drinking enough water.   Not eating enough fiber-rich foods.   Stress or travel.  Lack of physical activity or exercise.   Ignoring the urge to have a bowel movement.   Using laxatives too much.  SIGNS AND SYMPTOMS   Having fewer than three bowel movements a week.   Straining to have a bowel  movement.   Having stools that are hard, dry, or larger than normal.   Feeling full or bloated.   Pain in the lower abdomen.   Not feeling relief after having a bowel movement.  DIAGNOSIS  Your health care provider will take a medical history and perform a physical exam. Further testing may be done for severe constipation. Some tests may include:  A barium enema X-ray to examine your rectum, colon, and, sometimes, your small intestine.   A sigmoidoscopy to examine your lower colon.   A colonoscopy to examine your entire colon. TREATMENT  Treatment will depend on the severity of your constipation and what is causing it. Some dietary treatments include drinking more fluids and eating more fiber-rich foods. Lifestyle treatments may include regular exercise. If these diet and lifestyle recommendations do not help, your health care provider may recommend taking over-the-counter laxative medicines to help you have bowel movements. Prescription medicines may be prescribed if over-the-counter medicines do not work.  HOME CARE INSTRUCTIONS   Eat foods that have a lot of fiber, such as fruits, vegetables, whole grains, and beans.  Limit foods high in fat and processed sugars, such as french fries, hamburgers, cookies, candies, and soda.   A fiber supplement may be added to your diet if you cannot get enough fiber from foods.   Drink enough fluids to keep your urine clear or pale yellow.   Exercise regularly or as directed by your health care provider.   Go to the restroom when you have the urge to go. Do not hold it.   Only take over-the-counter or prescription medicines as directed by your health care provider. Do not take other medicines for constipation without talking to your health care provider first.  [place] IF:   You have bright red blood in your stool.   Your constipation lasts for more than 4 days or gets worse.   You have abdominal or  rectal pain.   You have thin, pencil-like stools.   You have unexplained weight loss. MAKE SURE YOU:   Understand these instructions.  Will watch your condition.  Will get help right away if you are not doing well or get worse.   This information is not intended to replace advice given to you by your health care provider. Make sure you discuss any questions you have with your health care provider.   Document Released: 07/26/2004 Document Revised: 11/18/2014 Document Reviewed: 08/09/2013 Elsevier Interactive Patient Education [employer].

## 2016-09-17 ENCOUNTER — Encounter (HOSPITAL_COMMUNITY): Payer: Self-pay | Admitting: Hematology & Oncology

## 2016-09-24 DIAGNOSIS — M25552 Pain in left hip: Secondary | ICD-10-CM | POA: Diagnosis not present

## 2016-09-24 DIAGNOSIS — M5442 Lumbago with sciatica, left side: Secondary | ICD-10-CM | POA: Diagnosis not present

## 2016-09-26 ENCOUNTER — Other Ambulatory Visit: Payer: Self-pay | Admitting: Orthopedic Surgery

## 2016-09-26 DIAGNOSIS — M5442 Lumbago with sciatica, left side: Secondary | ICD-10-CM

## 2016-09-27 ENCOUNTER — Ambulatory Visit (HOSPITAL_COMMUNITY): Payer: Medicare Other | Admitting: Oncology

## 2016-09-27 ENCOUNTER — Other Ambulatory Visit (HOSPITAL_COMMUNITY): Payer: Medicare Other

## 2016-10-08 ENCOUNTER — Ambulatory Visit: Payer: Medicare Other | Admitting: Orthopaedic Surgery

## 2016-10-14 ENCOUNTER — Ambulatory Visit
Admission: RE | Admit: 2016-10-14 | Discharge: 2016-10-14 | Disposition: A | Discharge status: Home / Self Care | Payer: Medicare Other | Source: Ambulatory Visit | Attending: Orthopedic Surgery | Admitting: Orthopedic Surgery

## 2016-10-14 VITALS — BP 150/83 | HR 81

## 2016-10-14 DIAGNOSIS — M5442 Lumbago with sciatica, left side: Secondary | ICD-10-CM

## 2016-10-14 DIAGNOSIS — M25552 Pain in left hip: Secondary | ICD-10-CM

## 2016-10-14 DIAGNOSIS — M48061 Spinal stenosis, lumbar region without neurogenic claudication: Secondary | ICD-10-CM | POA: Diagnosis not present

## 2016-10-14 MED ORDER — MEPERIDINE HCL 100 MG/ML IJ SOLN
50.0000 mg | Freq: Once | INTRAMUSCULAR | Status: AC
  Administered 2016-10-14: 50 mg via INTRAMUSCULAR

## 2016-10-14 MED ORDER — ONDANSETRON HCL 4 MG/2ML IJ SOLN
4.0000 mg | Freq: Once | INTRAMUSCULAR | Status: AC
  Administered 2016-10-14: 4 mg via INTRAMUSCULAR

## 2016-10-14 MED ORDER — IOPAMIDOL (ISOVUE-M 200) INJECTION 41%
15.0000 mL | Freq: Once | INTRAMUSCULAR | Status: AC
  Administered 2016-10-14: 15 mL via INTRATHECAL

## 2016-10-14 NOTE — Discharge Instructions (Signed)

## 2016-10-17 ENCOUNTER — Ambulatory Visit (HOSPITAL_COMMUNITY): Payer: Medicare Other | Admitting: Oncology

## 2016-10-17 ENCOUNTER — Other Ambulatory Visit (HOSPITAL_COMMUNITY): Payer: Medicare Other

## 2016-10-24 DIAGNOSIS — M48062 Spinal stenosis, lumbar region with neurogenic claudication: Secondary | ICD-10-CM | POA: Diagnosis not present

## 2016-10-24 DIAGNOSIS — M5442 Lumbago with sciatica, left side: Secondary | ICD-10-CM | POA: Diagnosis not present

## 2016-11-05 DIAGNOSIS — J449 Chronic obstructive pulmonary disease, unspecified: Secondary | ICD-10-CM | POA: Diagnosis not present

## 2016-11-05 DIAGNOSIS — J441 Chronic obstructive pulmonary disease with (acute) exacerbation: Secondary | ICD-10-CM | POA: Diagnosis not present

## 2016-11-05 DIAGNOSIS — I10 Essential (primary) hypertension: Secondary | ICD-10-CM | POA: Diagnosis not present

## 2016-11-05 DIAGNOSIS — R062 Wheezing: Secondary | ICD-10-CM | POA: Diagnosis not present

## 2016-11-08 ENCOUNTER — Emergency Department (HOSPITAL_COMMUNITY): Payer: Medicare Other

## 2016-11-08 ENCOUNTER — Inpatient Hospital Stay (HOSPITAL_COMMUNITY)
Admission: EM | Admit: 2016-11-08 | Discharge: 2016-11-20 | DRG: 286 | Disposition: A | Discharge status: Home Health | Payer: Medicare Other | Attending: Internal Medicine | Admitting: Internal Medicine

## 2016-11-08 ENCOUNTER — Other Ambulatory Visit: Payer: Self-pay

## 2016-11-08 ENCOUNTER — Encounter (HOSPITAL_COMMUNITY): Payer: Self-pay | Admitting: *Deleted

## 2016-11-08 DIAGNOSIS — T85598A Other mechanical complication of other gastrointestinal prosthetic devices, implants and grafts, initial encounter: Secondary | ICD-10-CM

## 2016-11-08 DIAGNOSIS — Z90710 Acquired absence of both cervix and uterus: Secondary | ICD-10-CM | POA: Diagnosis not present

## 2016-11-08 DIAGNOSIS — M6281 Muscle weakness (generalized): Secondary | ICD-10-CM

## 2016-11-08 DIAGNOSIS — R7989 Other specified abnormal findings of blood chemistry: Secondary | ICD-10-CM | POA: Diagnosis present

## 2016-11-08 DIAGNOSIS — E039 Hypothyroidism, unspecified: Secondary | ICD-10-CM | POA: Diagnosis present

## 2016-11-08 DIAGNOSIS — R262 Difficulty in walking, not elsewhere classified: Secondary | ICD-10-CM

## 2016-11-08 DIAGNOSIS — J9601 Acute respiratory failure with hypoxia: Secondary | ICD-10-CM | POA: Diagnosis not present

## 2016-11-08 DIAGNOSIS — I130 Hypertensive heart and chronic kidney disease with heart failure and stage 1 through stage 4 chronic kidney disease, or unspecified chronic kidney disease: Secondary | ICD-10-CM | POA: Diagnosis not present

## 2016-11-08 DIAGNOSIS — I472 Ventricular tachycardia: Secondary | ICD-10-CM | POA: Diagnosis not present

## 2016-11-08 DIAGNOSIS — N183 Chronic kidney disease, stage 3 unspecified: Secondary | ICD-10-CM | POA: Diagnosis present

## 2016-11-08 DIAGNOSIS — E876 Hypokalemia: Secondary | ICD-10-CM | POA: Diagnosis present

## 2016-11-08 DIAGNOSIS — R9431 Abnormal electrocardiogram [ECG] [EKG]: Secondary | ICD-10-CM | POA: Diagnosis not present

## 2016-11-08 DIAGNOSIS — I5023 Acute on chronic systolic (congestive) heart failure: Secondary | ICD-10-CM

## 2016-11-08 DIAGNOSIS — R079 Chest pain, unspecified: Secondary | ICD-10-CM | POA: Diagnosis not present

## 2016-11-08 DIAGNOSIS — I428 Other cardiomyopathies: Secondary | ICD-10-CM

## 2016-11-08 DIAGNOSIS — R0602 Shortness of breath: Secondary | ICD-10-CM | POA: Diagnosis not present

## 2016-11-08 DIAGNOSIS — I482 Chronic atrial fibrillation: Secondary | ICD-10-CM | POA: Diagnosis not present

## 2016-11-08 DIAGNOSIS — Z79899 Other long term (current) drug therapy: Secondary | ICD-10-CM | POA: Diagnosis not present

## 2016-11-08 DIAGNOSIS — Z85048 Personal history of other malignant neoplasm of rectum, rectosigmoid junction, and anus: Secondary | ICD-10-CM | POA: Diagnosis not present

## 2016-11-08 DIAGNOSIS — Y95 Nosocomial condition: Secondary | ICD-10-CM | POA: Diagnosis present

## 2016-11-08 DIAGNOSIS — I420 Dilated cardiomyopathy: Secondary | ICD-10-CM | POA: Diagnosis not present

## 2016-11-08 DIAGNOSIS — J189 Pneumonia, unspecified organism: Secondary | ICD-10-CM | POA: Diagnosis present

## 2016-11-08 DIAGNOSIS — Z883 Allergy status to other anti-infective agents status: Secondary | ICD-10-CM

## 2016-11-08 DIAGNOSIS — I255 Ischemic cardiomyopathy: Secondary | ICD-10-CM

## 2016-11-08 DIAGNOSIS — E7800 Pure hypercholesterolemia, unspecified: Secondary | ICD-10-CM

## 2016-11-08 DIAGNOSIS — J440 Chronic obstructive pulmonary disease with acute lower respiratory infection: Secondary | ICD-10-CM | POA: Diagnosis present

## 2016-11-08 DIAGNOSIS — I429 Cardiomyopathy, unspecified: Secondary | ICD-10-CM | POA: Diagnosis not present

## 2016-11-08 DIAGNOSIS — E785 Hyperlipidemia, unspecified: Secondary | ICD-10-CM | POA: Diagnosis present

## 2016-11-08 DIAGNOSIS — R0682 Tachypnea, not elsewhere classified: Secondary | ICD-10-CM | POA: Diagnosis not present

## 2016-11-08 DIAGNOSIS — Z4682 Encounter for fitting and adjustment of non-vascular catheter: Secondary | ICD-10-CM | POA: Diagnosis not present

## 2016-11-08 DIAGNOSIS — Z825 Family history of asthma and other chronic lower respiratory diseases: Secondary | ICD-10-CM

## 2016-11-08 DIAGNOSIS — N179 Acute kidney failure, unspecified: Secondary | ICD-10-CM | POA: Diagnosis not present

## 2016-11-08 DIAGNOSIS — T502X5A Adverse effect of carbonic-anhydrase inhibitors, benzothiadiazides and other diuretics, initial encounter: Secondary | ICD-10-CM | POA: Diagnosis not present

## 2016-11-08 DIAGNOSIS — Z841 Family history of disorders of kidney and ureter: Secondary | ICD-10-CM

## 2016-11-08 DIAGNOSIS — R778 Other specified abnormalities of plasma proteins: Secondary | ICD-10-CM | POA: Diagnosis present

## 2016-11-08 DIAGNOSIS — I2510 Atherosclerotic heart disease of native coronary artery without angina pectoris: Secondary | ICD-10-CM | POA: Diagnosis not present

## 2016-11-08 DIAGNOSIS — J9621 Acute and chronic respiratory failure with hypoxia: Secondary | ICD-10-CM | POA: Diagnosis present

## 2016-11-08 DIAGNOSIS — D638 Anemia in other chronic diseases classified elsewhere: Secondary | ICD-10-CM | POA: Diagnosis present

## 2016-11-08 DIAGNOSIS — I10 Essential (primary) hypertension: Secondary | ICD-10-CM | POA: Diagnosis not present

## 2016-11-08 DIAGNOSIS — I5021 Acute systolic (congestive) heart failure: Secondary | ICD-10-CM | POA: Diagnosis not present

## 2016-11-08 DIAGNOSIS — J441 Chronic obstructive pulmonary disease with (acute) exacerbation: Secondary | ICD-10-CM

## 2016-11-08 DIAGNOSIS — K219 Gastro-esophageal reflux disease without esophagitis: Secondary | ICD-10-CM | POA: Diagnosis present

## 2016-11-08 DIAGNOSIS — I493 Ventricular premature depolarization: Secondary | ICD-10-CM | POA: Diagnosis not present

## 2016-11-08 DIAGNOSIS — Z87891 Personal history of nicotine dependence: Secondary | ICD-10-CM

## 2016-11-08 DIAGNOSIS — I700 Atherosclerosis of aorta: Secondary | ICD-10-CM | POA: Diagnosis not present

## 2016-11-08 DIAGNOSIS — R748 Abnormal levels of other serum enzymes: Secondary | ICD-10-CM | POA: Diagnosis not present

## 2016-11-08 LAB — CBC WITH DIFFERENTIAL/PLATELET
BASOS ABS: 0 10*3/uL (ref 0.0–0.1)
Basophils Relative: 0 %
EOS ABS: 0 10*3/uL (ref 0.0–0.7)
EOS PCT: 0 %
HCT: 32.2 % — ABNORMAL LOW (ref 36.0–46.0)
Hemoglobin: 10.5 g/dL — ABNORMAL LOW (ref 12.0–15.0)
LYMPHS PCT: 6 %
Lymphs Abs: 0.7 10*3/uL (ref 0.7–4.0)
MCH: 34.4 pg — AB (ref 26.0–34.0)
MCHC: 32.6 g/dL (ref 30.0–36.0)
MCV: 105.6 fL — AB (ref 78.0–100.0)
MONO ABS: 0.7 10*3/uL (ref 0.1–1.0)
Monocytes Relative: 6 %
Neutro Abs: 9.3 10*3/uL — ABNORMAL HIGH (ref 1.7–7.7)
Neutrophils Relative %: 88 %
PLATELETS: 290 10*3/uL (ref 150–400)
RBC: 3.05 MIL/uL — AB (ref 3.87–5.11)
RDW: 15.2 % (ref 11.5–15.5)
WBC: 10.7 10*3/uL — AB (ref 4.0–10.5)

## 2016-11-08 LAB — BASIC METABOLIC PANEL
ANION GAP: 8 (ref 5–15)
BUN: 28 mg/dL — AB (ref 6–20)
CALCIUM: 7.8 mg/dL — AB (ref 8.9–10.3)
CO2: 29 mmol/L (ref 22–32)
Chloride: 107 mmol/L (ref 101–111)
Creatinine, Ser: 1.94 mg/dL — ABNORMAL HIGH (ref 0.44–1.00)
GFR calc Af Amer: 27 mL/min — ABNORMAL LOW (ref >60)
GFR, EST NON AFRICAN AMERICAN: 24 mL/min — AB (ref >60)
GLUCOSE: 112 mg/dL — AB (ref 65–99)
Potassium: 3.5 mmol/L (ref 3.5–5.1)
SODIUM: 144 mmol/L (ref 135–145)

## 2016-11-08 LAB — TROPONIN I
TROPONIN I: 0.04 ng/mL — AB (ref <0.03)
TROPONIN I: 0.04 ng/mL — AB (ref <0.03)
Troponin I: 0.05 ng/mL (ref <0.03)
Troponin I: 0.05 ng/mL (ref <0.03)

## 2016-11-08 LAB — BLOOD GAS, ARTERIAL
ACID-BASE EXCESS: 0.8 mmol/L (ref 0.0–2.0)
BICARBONATE: 24.9 mmol/L (ref 20.0–28.0)
DRAWN BY: 277331
O2 CONTENT: 2 L/min
O2 Saturation: 94.1 %
PATIENT TEMPERATURE: 37
pCO2 arterial: 43.2 mmHg (ref 32.0–48.0)
pH, Arterial: 7.385 (ref 7.350–7.450)
pO2, Arterial: 77.9 mmHg — ABNORMAL LOW (ref 83.0–108.0)

## 2016-11-08 MED ORDER — SODIUM CHLORIDE 0.9 % IV BOLUS (SEPSIS)
500.0000 mL | Freq: Once | INTRAVENOUS | Status: AC
  Administered 2016-11-08: 500 mL via INTRAVENOUS

## 2016-11-08 MED ORDER — ONDANSETRON HCL 4 MG PO TABS
4.0000 mg | ORAL_TABLET | Freq: Four times a day (QID) | ORAL | Status: DC | PRN
  Administered 2016-11-09: 4 mg via ORAL
  Filled 2016-11-08: qty 1

## 2016-11-08 MED ORDER — LIDOCAINE BOLUS VIA INFUSION
75.0000 mg | Freq: Once | INTRAVENOUS | Status: AC
  Administered 2016-11-08: 75 mg via INTRAVENOUS
  Filled 2016-11-08: qty 76

## 2016-11-08 MED ORDER — LIDOCAINE IN D5W 4-5 MG/ML-% IV SOLN
1.0000 mg/min | INTRAVENOUS | Status: DC
  Administered 2016-11-08 – 2016-11-10 (×2): 1 mg/min via INTRAVENOUS
  Filled 2016-11-08 (×2): qty 500

## 2016-11-08 MED ORDER — IPRATROPIUM-ALBUTEROL 0.5-2.5 (3) MG/3ML IN SOLN: 3.0000 mL | RESPIRATORY_TRACT | Status: DC | PRN

## 2016-11-08 MED ORDER — METHYLPREDNISOLONE SODIUM SUCC 40 MG IJ SOLR
40.0000 mg | Freq: Two times a day (BID) | INTRAMUSCULAR | Status: DC
  Administered 2016-11-08 – 2016-11-11 (×6): 40 mg via INTRAVENOUS
  Filled 2016-11-08 (×7): qty 1

## 2016-11-08 MED ORDER — TEMAZEPAM 15 MG PO CAPS
15.0000 mg | ORAL_CAPSULE | Freq: Every evening | ORAL | Status: DC | PRN
  Administered 2016-11-11: 15 mg via ORAL
  Filled 2016-11-08: qty 1

## 2016-11-08 MED ORDER — ACETAMINOPHEN 650 MG RE SUPP: 650.0000 mg | Freq: Four times a day (QID) | RECTAL | Status: DC | PRN

## 2016-11-08 MED ORDER — LEVOTHYROXINE SODIUM 88 MCG PO TABS
88.0000 ug | ORAL_TABLET | Freq: Every day | ORAL | Status: DC
  Administered 2016-11-09 – 2016-11-12 (×4): 88 ug via ORAL
  Filled 2016-11-08 (×5): qty 1

## 2016-11-08 MED ORDER — IPRATROPIUM-ALBUTEROL 0.5-2.5 (3) MG/3ML IN SOLN
3.0000 mL | Freq: Once | RESPIRATORY_TRACT | Status: AC
  Administered 2016-11-08: 3 mL via RESPIRATORY_TRACT
  Filled 2016-11-08: qty 3

## 2016-11-08 MED ORDER — HYDRALAZINE HCL 20 MG/ML IJ SOLN
5.0000 mg | Freq: Four times a day (QID) | INTRAMUSCULAR | Status: DC | PRN
Start: 2016-11-08 — End: 2016-11-20
  Administered 2016-11-08: 5 mg via INTRAVENOUS
  Filled 2016-11-08 (×2): qty 1

## 2016-11-08 MED ORDER — ACETAMINOPHEN 325 MG PO TABS
650.0000 mg | ORAL_TABLET | Freq: Four times a day (QID) | ORAL | Status: DC | PRN
  Administered 2016-11-09: 650 mg via ORAL
  Filled 2016-11-08: qty 2

## 2016-11-08 MED ORDER — IPRATROPIUM BROMIDE 0.02 % IN SOLN
0.5000 mg | Freq: Four times a day (QID) | RESPIRATORY_TRACT | Status: DC
  Administered 2016-11-08 – 2016-11-12 (×14): 0.5 mg via RESPIRATORY_TRACT
  Filled 2016-11-08 (×14): qty 2.5

## 2016-11-08 MED ORDER — POTASSIUM CHLORIDE CRYS ER 10 MEQ PO TBCR
10.0000 meq | EXTENDED_RELEASE_TABLET | Freq: Every day | ORAL | Status: DC
  Administered 2016-11-09 – 2016-11-12 (×4): 10 meq via ORAL
  Filled 2016-11-08 (×5): qty 1

## 2016-11-08 MED ORDER — ONDANSETRON HCL 4 MG/2ML IJ SOLN: 4.0000 mg | Freq: Four times a day (QID) | INTRAMUSCULAR | Status: DC | PRN

## 2016-11-08 MED ORDER — FUROSEMIDE 10 MG/ML IJ SOLN
80.0000 mg | Freq: Once | INTRAMUSCULAR | Status: AC
  Administered 2016-11-08: 80 mg via INTRAVENOUS
  Filled 2016-11-08: qty 8

## 2016-11-08 MED ORDER — METHYLPREDNISOLONE SODIUM SUCC 125 MG IJ SOLR
60.0000 mg | Freq: Once | INTRAMUSCULAR | Status: AC
  Administered 2016-11-08: 60 mg via INTRAVENOUS
  Filled 2016-11-08: qty 2

## 2016-11-08 MED ORDER — LEVALBUTEROL HCL 0.63 MG/3ML IN NEBU
0.6300 mg | INHALATION_SOLUTION | Freq: Four times a day (QID) | RESPIRATORY_TRACT | Status: DC
  Administered 2016-11-08 – 2016-11-11 (×11): 0.63 mg via RESPIRATORY_TRACT
  Filled 2016-11-08 (×19): qty 3

## 2016-11-08 MED ORDER — ORAL CARE MOUTH RINSE: 15.0000 mL | Freq: Two times a day (BID) | OROMUCOSAL | Status: DC

## 2016-11-08 MED ORDER — ATORVASTATIN CALCIUM 40 MG PO TABS
40.0000 mg | ORAL_TABLET | Freq: Every day | ORAL | Status: DC
  Administered 2016-11-09 – 2016-11-19 (×11): 40 mg via ORAL
  Filled 2016-11-08 (×11): qty 1

## 2016-11-08 MED ORDER — AMLODIPINE BESYLATE 10 MG PO TABS
10.0000 mg | ORAL_TABLET | Freq: Every day | ORAL | Status: DC
  Administered 2016-11-08 – 2016-11-20 (×13): 10 mg via ORAL
  Filled 2016-11-08 (×14): qty 1
  Filled 2016-11-08: qty 2

## 2016-11-08 MED ORDER — PANTOPRAZOLE SODIUM 40 MG PO TBEC
40.0000 mg | DELAYED_RELEASE_TABLET | Freq: Every day | ORAL | Status: DC
  Administered 2016-11-09 – 2016-11-20 (×12): 40 mg via ORAL
  Filled 2016-11-08 (×12): qty 1

## 2016-11-08 NOTE — ED Notes (Signed)
CRITICAL VALUE ALERT  Critical value received:  Troponin 0.04  Date of notification:  12/29/20017  Time of notification:  [id number]  Critical value read back:Yes.    Nurse who received alert:  [facility] RN  MD notified (1st page):  Dr. Lacinda Axon  Time of first page:  0935  MD notified (2nd page):  Time of second page:  Responding MD:  Dr. Lacinda Axon   Time MD responded:  [phone]

## 2016-11-08 NOTE — Progress Notes (Signed)
Checked on patient again to make sure she is stable for transfer. Her family is at the bedside. She is on BiPAP, resp status stable. Her SBP is in 170's but clinically she looks stable and will be okay to handle the transportation to [facility]. I called bed placement, opening on 2 M, explained to family that we are transporting to [facility] and all in agreement.  Leisa Lenz [facility] [phone]

## 2016-11-08 NOTE — ED Notes (Signed)
Pt c/o CP and SOB, audible wheezing noted.  EDP made aware and asked to speak with family and pt.

## 2016-11-08 NOTE — ED Provider Notes (Signed)
[facility] DEPT Provider Note   CSN: [id number] Arrival date & time: 11/08/16  [id number]   By signing my name below, I, Eunice Blase, attest that this documentation has been prepared under the direction and in the presence of Nat Christen, MD. Electronically signed, Eunice Blase, ED Scribe. 11/08/16. 8:31 AM.   History   Chief Complaint Chief Complaint  Patient presents with  . Shortness of Breath   The history is provided by the patient, a relative and medical records. No language interpreter was used.    HPI Comments: LIZVET TANTILLO is a 80 y.o. female BIB EMS with Hx of COPD who presents to the Emergency Department complaining of progressively worsening SOB x 2 days. Symptoms improved prior to evaluation with oxygen mask. States she went to a pilmonologist in [place] 12/26. Using nebulizer at home. Takes respiratory meds as presribed. Pt also prescribed a z-pack and prednisone, which she takes presently. Lives at thome alone. Primary care at [facility] via EMS PTA. They couldn't get IV on her. Family en route. Denies Hx A-Fib.  Past Medical History:  Diagnosis Date  . Arthritis   . Cataract   . Chronic bronchitis ([place])   . Chronic renal disease, stage 3, moderately decreased glomerular filtration rate (GFR) between 30-59 mL/min/1.73 square meter 02/21/2016  . Complication of anesthesia   . COPD (chronic obstructive pulmonary disease) ([place])   . GERD (gastroesophageal reflux disease)   . Hyperlipidemia   . Hypertension   . Hypothyroidism   . Murmur, heart   . PONV (postoperative nausea and vomiting)   . Rectal adenocarcinoma ([place])   . Thyroid disease     Patient Active Problem List   Diagnosis Date Noted  . Hand foot syndrome 08/13/2016  . Neuropathy due to chemotherapeutic drug ([place]) 08/13/2016  . Left hip pain 08/13/2016  . Body mass index (BMI) of 22.0-22.9 in adult 07/04/2016  . Chronic renal disease, stage 3, moderately  decreased glomerular filtration rate (GFR) between 30-59 mL/min/1.73 square meter 02/21/2016  . Diverticulosis of colon without hemorrhage   . History of colonic polyps   . Rectal adenocarcinoma ([place])   . Constipation 10/23/2015  . Hypothyroidism 06/29/2015  . Thoracic aorta atherosclerosis ([place]) 08/29/2014  . COPD (chronic obstructive pulmonary disease) ([place]) 07/29/2014  . GERD (gastroesophageal reflux disease) 07/29/2014  . Essential hypertension, benign 07/29/2014  . Heart murmur 07/29/2014  . Arthritis 07/29/2014  . Osteopenia 07/29/2014  . Hyperlipidemia 07/29/2014    Past Surgical History:  Procedure Laterality Date  . ABDOMINAL HYSTERECTOMY     complete  . COLONOSCOPY N/A 11/01/2015   RMR: Large fungating rectal mass precluded colonoscopy. Status post biopsy.   . COLONOSCOPY N/A 01/15/2016   Procedure: COLONOSCOPY;  Surgeon: [name], MD;  Location: [facility];  Service: Endoscopy;  Laterality: N/A;  130   . FLEXIBLE SIGMOIDOSCOPY N/A 05/17/2016   Procedure: FLEXIBLE SIGMOIDOSCOPY;  Surgeon: [name], MD;  Location: [facility] ENDOSCOPY;  Service: Endoscopy;  Laterality: N/A;  . Left ear    . TRANSANAL ENDOSCOPIC MICROSURGERY N/A 12/14/2015   Procedure: TRANSANAL ENDOSCOPIC MICROSURGERY OF RECTAL POLYP;  Surgeon: [name], MD;  Location: [facility] ORS;  Service: General;  Laterality: N/A;    OB History    No data available       Home Medications    Prior to Admission medications   Medication Sig Start Date End Date Taking? Authorizing Provider  albuterol (PROVENTIL) (2.5 MG/3ML) 0.083% nebulizer solution Take 2.5 mg  by nebulization every 4 (four) hours as needed for wheezing or shortness of breath.   Yes Historical Provider, MD  atorvastatin (LIPITOR) 80 MG tablet Take 40 mg by mouth daily.   Yes Historical Provider, MD  azithromycin (ZITHROMAX) 250 MG tablet Take 250-500 mg by mouth daily. Pt is to take two tablets on day 1 and one tablet daily for the next four  days. 11/05/16 11/10/16 Yes Historical Provider, MD  fluticasone furoate-vilanterol (BREO ELLIPTA) 100-25 MCG/INH AEPB Inhale 1 puff into the lungs daily.   Yes Historical Provider, MD  ibuprofen (ADVIL,MOTRIN) 800 MG tablet Take 800 mg by mouth every 8 (eight) hours as needed for mild pain or moderate pain.   Yes Historical Provider, MD  levothyroxine (SYNTHROID, LEVOTHROID) 88 MCG tablet Take 1 tablet (88 mcg total) by mouth daily. 07/05/16  Yes Fransisca Kaufmann Dettinger, MD  lisinopril-hydrochlorothiazide (PRINZIDE,ZESTORETIC) 20-12.5 MG tablet Take 2 tablets by mouth daily. 07/05/16  Yes Fransisca Kaufmann Dettinger, MD  omeprazole (PRILOSEC) 40 MG capsule Take 40 mg by mouth daily.   Yes Historical Provider, MD  potassium chloride SA (K-DUR,KLOR-CON) 20 MEQ tablet Take 10 mEq by mouth daily.    Yes Historical Provider, MD  predniSONE (DELTASONE) 10 MG tablet Take 10-40 mg by mouth daily with breakfast. Pt is to take as a taper:    4 tablets for three days, 3 tablets for three days, 2 tablets for three days, 1 tablet for three days, then STOP. 11/05/16 11/20/16 Yes Historical Provider, MD  temazepam (RESTORIL) 15 MG capsule Take 1 capsule (15 mg total) by mouth at bedtime as needed for sleep. 09/02/16  Yes Manon Hilding Kefalas, PA-C  tiotropium (SPIRIVA HANDIHALER) 18 MCG inhalation capsule Place 1 capsule (18 mcg total) into inhaler and inhale daily. 06/29/15  Yes Fransisca Kaufmann Dettinger, MD    Family History Family History  Problem Relation Age of Onset  . Kidney disease Mother     nephrectomy x 1  . COPD Brother   . Emphysema Brother   . Asthma Brother   . Cancer Brother 76    brain  . Colon cancer Neg Hx     Social History Social History  Substance Use Topics  . Smoking status: Former Smoker    Quit date: 11/11/2010  . Smokeless tobacco: Never Used  . Alcohol use No     Allergies   Levofloxacin; Moxifloxacin; Prednisone; and Quinolones   Review of Systems Review of Systems  All other systems  reviewed and are negative. A complete 10 system review of systems was obtained and all systems are negative except as noted in the HPI and PMH.     Physical Exam Updated Vital Signs BP 156/97 (BP Location: Left Arm)   Pulse 101   Temp 98 F (36.7 C) (Oral)   Resp 22   Ht 5\' 1"  (1.549 m)   Wt 132 lb (59.9 kg)   SpO2 100%   BMI 24.94 kg/m   Physical Exam  Constitutional: She is oriented to person, place, and time.  Slightly tachypnic and dyspnic  HENT:  Head: Normocephalic and atraumatic.  Eyes: Conjunctivae are normal.  Neck: Neck supple.  Cardiovascular: Normal rate and regular rhythm.   Pulmonary/Chest: Effort normal and breath sounds normal.  Bilateral wheezes.  Abdominal: Soft. Bowel sounds are normal.  Musculoskeletal: Normal range of motion.  Neurological: She is alert and oriented to person, place, and time.  Skin: Skin is warm and dry.  Psychiatric: She has a normal mood and  affect. Her behavior is normal.  Nursing note and vitals reviewed.    ED Treatments / Results  DIAGNOSTIC STUDIES: Oxygen Saturation is 100% on oxygen mask, adequate by my interpretation.    COORDINATION OF CARE: 8:24 AM Discussed treatment plan with pt at bedside and pt agreed to plan.  Labs (all labs ordered are listed, but only abnormal results are displayed) Labs Reviewed  CBC WITH DIFFERENTIAL/PLATELET - Abnormal; Notable for the following:       Result Value   WBC 10.7 (*)    RBC 3.05 (*)    Hemoglobin 10.5 (*)    HCT 32.2 (*)    MCV 105.6 (*)    MCH 34.4 (*)    Neutro Abs 9.3 (*)    All other components within normal limits  BASIC METABOLIC PANEL - Abnormal; Notable for the following:    Glucose, Bld 112 (*)    BUN 28 (*)    Creatinine, Ser 1.94 (*)    Calcium 7.8 (*)    GFR calc non Af Amer 24 (*)    GFR calc Af Amer 27 (*)    All other components within normal limits  TROPONIN I - Abnormal; Notable for the following:    Troponin I 0.04 (*)    All other components  within normal limits  TROPONIN I - Abnormal; Notable for the following:    Troponin I 0.05 (*)    All other components within normal limits    EKG  EKG Interpretation  Date/Time:  Friday November 08 2016 08:16:25 EST Ventricular Rate:  117 PR Interval:    QRS Duration: 93 QT Interval:  402 QTC Calculation: 449 R Axis:   50 Text Interpretation:  Atrial fibrillation Ventricular tachycardia, unsustained RSR' in V1 or V2, probably normal variant Minimal ST depression, lateral leads Baseline wander in lead(s) V2 Confirmed by Lacinda Axon  MD, Michaela Shankel ([id number]) on 11/08/2016 12:39:19 PM       Radiology Dg Chest 2 View  Result Date: 11/08/2016 CLINICAL DATA:  80 year old female with chest pain and shortness of breath since yesterday. Initial encounter. Personal history of colon cancer. EXAM: CHEST  2 VIEW COMPARISON:  CT Abdomen and Pelvis 05/22/2016. Chest radiographs 05/22/2016 and earlier. FINDINGS: Upright AP and lateral views of the chest. New small bilateral pleural effusions. Stable mild cardiomegaly. Other mediastinal contours are within normal limits. Visualized tracheal air column is within normal limits. No pneumothorax. No consolidation. Chronic diffuse increased pulmonary interstitium appears not significantly changed. No acute edema suspected. Osteopenia. Calcified aortic atherosclerosis. No acute osseous abnormality identified. Negative visible bowel gas pattern. IMPRESSION: New small bilateral pleural effusions. No other acute cardiopulmonary abnormality. Electronically Signed   By: Genevie Ann M.D.   On: 11/08/2016 10:57    Procedures Procedures (including critical care time)  Medications Ordered in ED Medications  atorvastatin (LIPITOR) tablet 40 mg (not administered)  pantoprazole (PROTONIX) EC tablet 40 mg (not administered)  potassium chloride SA (K-DUR,KLOR-CON) CR tablet 10 mEq (not administered)  temazepam (RESTORIL) capsule 15 mg (not administered)  levothyroxine (SYNTHROID,  LEVOTHROID) tablet 88 mcg (not administered)  amLODipine (NORVASC) tablet 10 mg (not administered)  levalbuterol (XOPENEX) nebulizer solution 0.63 mg (0.63 mg Nebulization Not Given 11/08/16 1335)  ipratropium (ATROVENT) nebulizer solution 0.5 mg (0.5 mg Nebulization Not Given 11/08/16 1335)  ipratropium-albuterol (DUONEB) 0.5-2.5 (3) MG/3ML nebulizer solution 3 mL (not administered)  methylPREDNISolone sodium succinate (SOLU-MEDROL) 40 mg/mL injection 40 mg (not administered)  ondansetron (ZOFRAN) tablet 4 mg (not administered)  Or  ondansetron (ZOFRAN) injection 4 mg (not administered)  acetaminophen (TYLENOL) tablet 650 mg (not administered)    Or  acetaminophen (TYLENOL) suppository 650 mg (not administered)  MEDLINE mouth rinse (not administered)  sodium chloride 0.9 % bolus 500 mL (0 mLs Intravenous Stopped 11/08/16 1132)  methylPREDNISolone sodium succinate (SOLU-MEDROL) 125 mg/2 mL injection 60 mg (60 mg Intravenous Given 11/08/16 0915)  ipratropium-albuterol (DUONEB) 0.5-2.5 (3) MG/3ML nebulizer solution 3 mL (3 mLs Nebulization Given 11/08/16 0932)  ipratropium-albuterol (DUONEB) 0.5-2.5 (3) MG/3ML nebulizer solution 3 mL (3 mLs Nebulization Given 11/08/16 1214)     Initial Impression / Assessment and Plan / ED Course  I have reviewed the triage vital signs and the nursing notes.  Pertinent labs & imaging results that were available during my care of the patient were reviewed by me and considered in my medical decision making (see chart for details).  Will order XR of the lungs, blood work, steroids and nebulizer breathing treatment.  Clinical Course     History and physical consistent with COPD exacerbation. Chest x-ray shows new small bilateral pleural effusions. IV steroids, nebulizer treatment, admit to general medicine.  Final Clinical Impressions(s) / ED Diagnoses   Final diagnoses:  COPD exacerbation [facility])    New Prescriptions Current Discharge Medication  List    I personally performed the services described in this documentation, which was scribed in my presence. The recorded information has been reviewed and is accurate.   t}    Nat Christen, MD 11/08/16 [phone]

## 2016-11-08 NOTE — Evaluation (Signed)
Physical Therapy Evaluation Patient Details Name: Meghan Anderson MRN: [id number] DOB: 05-Oct-1936 Today's Date: 11/08/2016   History of Present Illness  HPI: Meghan Anderson is a 80 y.o. female with medical history significant for hypothyroidism, GERD, COPD, chronic kidney disease stage III. Patient presented with worsening shortness of breath started for past 1 day prior to this admission. Patient reported feeling short of breath at rest and with exertion. She reported no associated cough. No fevers or chills. She did use inhaler at home with no significant symptomatic relief. No reports of chest pain. No palpitations. The nebulizers given in ED have provided some symptomatic relief. No reports of abdominal pain, nausea or vomiting. No lightheadedness or loss of consciousness.  Clinical Impression  Meghan Anderson normally is I living alone in her two story home and is able to go up and down steps.  At this time she is unable to tolerate sitting greater than five minutes at a time.  Therapist recommends SNF although pt does not want this at this time.     Follow Up Recommendations SNF (At this point pt states that she does not want to go to SNF)    Equipment Recommendations  None recommended by PT       Precautions / Restrictions Precautions Precautions: Fall Restrictions Weight Bearing Restrictions: No      Mobility  Bed Mobility Overal bed mobility: Needs Assistance Bed Mobility: Supine to Sit;Sit to Supine     Supine to sit: Mod assist Sit to supine: Min assist   General bed mobility comments: Pt sat on edge of bed for five minutes and refused to transfer or walk due to SOB  Transfers Overall transfer level: Modified independent                  Ambulation/Gait Ambulation/Gait assistance: Modified independent (Device/Increase time) Ambulation Distance (Feet): 90 Feet Assistive device: Rolling walker (2 wheeled) Gait Pattern/deviations: WFL(Within Functional Limits)   Gait velocity interpretation: Below normal speed for age/gender              Pertinent Vitals/Pain Pain Assessment: No/denies pain (states that her left side is numb.) Pain Location: back radiating into her legs.  States she went to MD who stated she needed back surgery but needed to get her lungs clear first.  MD gave pt prednisone and she has had difficulty breathing ever since.  Pain Descriptors / Indicators: Numbness Pain Intervention(s): Limited activity within patient's tolerance    Home Living Family/patient expects to be discharged to:: Private residence Living Arrangements: Alone Available Help at Discharge: Family Type of Home: House Home Access: Level entry     Home Layout: Two level Home Equipment: Environmental consultant - 2 wheels      Prior Function Level of Independence: Independent with assistive device(s);Needs assistance      ADL's / Homemaking Assistance Needed: family assists with these activiites.  Comments: occasionally uses a rolling walker      Hand Dominance        Extremity/Trunk Assessment   Upper Extremity Assessment Upper Extremity Assessment: Defer to OT evaluation    Lower Extremity Assessment:   Lower Extremity Assessment: RLE deficits/detail;LLE deficits/detail RLE Deficits / Details: hips generally 2/5; knee and ankle  3/5 LLE Deficits / Details: hip  and ankle generally 2-/5;  knee 3-/5    Cervical / Trunk Assessment Cervical / Trunk Assessment: Normal  Communication   Communication: No difficulties  Cognition Arousal/Alertness: Awake/alert Behavior During Therapy: WFL for tasks assessed/performed Overall  Cognitive Status: Within Functional Limits for tasks assessed                      General Comments      Exercises General Exercises - Lower Extremity Ankle Circles/Pumps: Both;10 reps (LT LE was AA for all exercises; Rt was AROM) Long Arc Quad: Both;10 reps Heel Slides: Both;5 reps Hip ABduction/ADduction: Both;5 reps    Assessment/Plan    PT Assessment Patient needs continued PT services  PT Problem List Decreased strength;Decreased activity tolerance;Decreased mobility          PT Treatment Interventions Gait training;Therapeutic activities;Patient/family education;Therapeutic exercise    PT Goals (Current goals can be found in the Care Plan section)  Acute Rehab PT Goals Patient Stated Goal: To Go home  PT Goal Formulation: With patient Time For Goal Achievement: 11/11/16 Potential to Achieve Goals: Good    Frequency Min 5X/week   Barriers to discharge        Co-evaluation               End of Session Equipment Utilized During Treatment: Gait belt Activity Tolerance: Treatment limited secondary to medical complications (Comment) (SOB) Patient left: in bed;with call bell/phone within reach Nurse Communication: Mobility status    Functional Limitation: Mobility: Walking and moving around Mobility: Walking and Moving Around Current Status [id number]): At least 80 percent but less than 100 percent impaired, limited or restricted Mobility: Walking and Moving Around Goal Status [phone]): At least 80 percent but less than 100 percent impaired, limited or restricted Mobility: Walking and Moving Around Discharge Status [phone]): At least 80 percent but less than 100 percent impaired, limited or restricted    Time: 1355-1420 PT Time Calculation (min) (ACUTE ONLY): 25 min   Charges:   PT Evaluation $PT Eval Moderate Complexity: 1 Procedure     PT G Codes:   PT G-Codes **NOT FOR INPATIENT CLASS** Functional Limitation: Mobility: Walking and moving around Mobility: Walking and Moving Around Current Status [id number]): At least 80 percent but less than 100 percent impaired, limited or restricted Mobility: Walking and Moving Around Goal Status [phone]): At least 80 percent but less than 100 percent impaired, limited or restricted Mobility: Walking and Moving Around Discharge Status [phone]): At  least 80 percent but less than 100 percent impaired, limited or restricted   Rayetta Humphrey, PT CLT [phone] 11/08/2016, 3:10 PM

## 2016-11-08 NOTE — Progress Notes (Signed)
Patient transferred to ICU room 12.  Report called to [facility].  Patient transported without difficulty.

## 2016-11-08 NOTE — ED Notes (Signed)
ED Provider at bedside. 

## 2016-11-08 NOTE — Progress Notes (Signed)
eLink Physician-Brief Progress Note Patient Name: Meghan Anderson DOB: 12/08/1935 MRN: [id number]   Date of Service  11/08/2016  HPI/Events of Note  80 yo female with PMH of Hypothyroidism, GERD, COPD, CKD - stage III. Admitted with AECOPD. CXR unremarkable except for small bilateral pleural effusion. Treated with Atrovent/Xopenx/Solumedrol/ Moved to ICU with increased respiratory distress. Now BP = 178/130. HR = 102 with frequent VT and PVC's. Sat = 93% and RR = 27.   eICU Interventions  Would control BP agressively and consider BiPAP if WOB is felt to be increased.  Check K+ and Mg++ if not already done. Will try to speak with Dr. Charlies Silvers who is the hospitalist.      Intervention Category Evaluation Type: New Patient Evaluation  Lysle Dingwall 11/08/2016, 4:42 PM and  %

## 2016-11-08 NOTE — Progress Notes (Signed)
Spoke with Dr. Harrington Challenger who did bedside ECHO, EF in 30's%, pt likely also volume overloaded so pt will get IV lasix Order for BIPAP placed already. Spoke with CCM on call in [place] - would like to stabilize the pt, see if she tolerates BIPAP, stabilize BP and transfer to [place]. Spoke with bed placement and no bed in SDU but possible available bed in ICU.   Leisa Lenz [facility] [phone]

## 2016-11-08 NOTE — ED Notes (Signed)
CRITICAL VALUE ALERT  Critical value received:  Troponin 0.05  Date of notification:  11/08/16  Time of notification:  [id number]  Critical value read back:Yes.    Nurse who received alert:  Norm Salt, RN  MD notified (1st page):  Dr. Lacinda Axon  Time of first page:  1256  MD notified (2nd page):  Time of second page:  Responding MD:  Dr. Lacinda Axon  Time MD responded:  1256

## 2016-11-08 NOTE — ED Notes (Signed)
Placed pt on bed pan.

## 2016-11-08 NOTE — Progress Notes (Signed)
PT TRANSFERRING TO [place] 7M13-C. REPORT GIVEN TO CARE LINK AND 7M NURSE June. PT ALERT. REMAINS ON BIPAP AT 40% O2 SAT 98-100%. UPPER LOBE EXPIRATORY [place]. HR 70'SIN NSR W/ OCCASSIONAL PVC'S. LIDOCAINE DRIP INFUSING AT 15CC/HR-1MG /MIN. BOTH IV'S INFUSING W/O DIFFICULTY. FOLEY CATH PATENT DRAINING CLEAR YELLOW URINE. FAMILY REMAINS AT BEDSIDE.

## 2016-11-08 NOTE — Consult Note (Signed)
Primary Physician: Primary Cardiologist:  New   Asked to see re ventricular ectopy    HPI:  Pt is a 80 yo with hstory of COPD, stage III CKD,  Presented to [facility] with 1 day history of SOB  No f/c.  EKG on admit showed SR with PVCs, NSVT    Pt given NS , solumedrol, nebulizer  Amlodipine started for HTN  Troponin minimally elevated at 0.05   Pt developed VT on telemetry  Transferred to ICU    Pt complaining of SOB  Chest tightnes that is worse with breathing   She does not answer many questions due to difficulty with breathing   EKG on 12/29:  SR 100 bpm  Frequent PVCs     Past Medical History:  Diagnosis Date  . Arthritis   . Cataract   . Chronic bronchitis ([place])   . Chronic renal disease, stage 3, moderately decreased glomerular filtration rate (GFR) between 30-59 mL/min/1.73 square meter 02/21/2016  . Complication of anesthesia   . COPD (chronic obstructive pulmonary disease) ([place])   . GERD (gastroesophageal reflux disease)   . Hyperlipidemia   . Hypertension   . Hypothyroidism   . Murmur, heart   . PONV (postoperative nausea and vomiting)   . Rectal adenocarcinoma ([place])   . Thyroid disease     Medications Prior to Admission  Medication Sig Dispense Refill  . albuterol (PROVENTIL) (2.5 MG/3ML) 0.083% nebulizer solution Take 2.5 mg by nebulization every 4 (four) hours as needed for wheezing or shortness of breath.    Marland Kitchen atorvastatin (LIPITOR) 80 MG tablet Take 40 mg by mouth daily.    Marland Kitchen azithromycin (ZITHROMAX) 250 MG tablet Take 250-500 mg by mouth daily. Pt is to take two tablets on day 1 and one tablet daily for the next four days.    . fluticasone furoate-vilanterol (BREO ELLIPTA) 100-25 MCG/INH AEPB Inhale 1 puff into the lungs daily.    Marland Kitchen ibuprofen (ADVIL,MOTRIN) 800 MG tablet Take 800 mg by mouth every 8 (eight) hours as needed for mild pain or moderate pain.    Marland Kitchen levothyroxine (SYNTHROID, LEVOTHROID) 88 MCG tablet Take 1 tablet (88 mcg total) by mouth daily. 90 tablet  1  . lisinopril-hydrochlorothiazide (PRINZIDE,ZESTORETIC) 20-12.5 MG tablet Take 2 tablets by mouth daily. 60 tablet 3  . omeprazole (PRILOSEC) 40 MG capsule Take 40 mg by mouth daily.    . potassium chloride SA (K-DUR,KLOR-CON) 20 MEQ tablet Take 10 mEq by mouth daily.     . predniSONE (DELTASONE) 10 MG tablet Take 10-40 mg by mouth daily with breakfast. Pt is to take as a taper:    4 tablets for three days, 3 tablets for three days, 2 tablets for three days, 1 tablet for three days, then STOP.    Marland Kitchen temazepam (RESTORIL) 15 MG capsule Take 1 capsule (15 mg total) by mouth at bedtime as needed for sleep. 30 capsule 2  . tiotropium (SPIRIVA HANDIHALER) 18 MCG inhalation capsule Place 1 capsule (18 mcg total) into inhaler and inhale daily. 90 capsule 3     . amLODipine  10 mg Oral Daily  . atorvastatin  40 mg Oral q1800  . ipratropium  0.5 mg Nebulization QID  . levalbuterol  0.63 mg Nebulization QID  . levothyroxine  88 mcg Oral QAC breakfast  . mouth rinse  15 mL Mouth Rinse BID  . methylPREDNISolone (SOLU-MEDROL) injection  40 mg Intravenous Q12H  . pantoprazole  40 mg Oral Daily  . potassium  chloride SA  10 mEq Oral Daily    Infusions:   Allergies  Allergen Reactions  . Levofloxacin Other (See Comments)    Pt states that this medication was too strong and she could not take them.    . Moxifloxacin Rash  . Prednisone Itching and Rash  . Quinolones Rash    Social History   Social History  . Marital status: Widowed    Spouse name: N/A  . Number of children: N/A  . Years of education: N/A   Occupational History  . Not on file.   Social History Main Topics  . Smoking status: Former Smoker    Quit date: 11/11/2010  . Smokeless tobacco: Never Used  . Alcohol use No  . Drug use: No  . Sexual activity: Not on file   Other Topics Concern  . Not on file   Social History Narrative  . No narrative on file    Family History  Problem Relation Age of Onset  . Kidney  disease Mother     nephrectomy x 1  . COPD Brother   . Emphysema Brother   . Asthma Brother   . Cancer Brother 78    brain  . Colon cancer Neg Hx     REVIEW OF SYSTEMS:  All systems reviewed  Negative to the above problem except as noted above.    PHYSICAL EXAM: Vitals:   11/08/16 1514 11/08/16 1600  BP: (!) 162/115 (!) 178/130  Pulse: (!) 103 98  Resp: (!) 22   Temp: 98.4 F (36.9 C)      Intake/Output Summary (Last 24 hours) at 11/08/16 1651 Last data filed at 11/08/16 1132  Gross per 24 hour  Intake              500 ml  Output                0 ml  Net              500 ml    General: Pt in mild distress due to resp difficulties   HEENT: normal Neck: supple.JVP is dfficult to assess  Carotids 2+ bilat; no bruits. No lymphadenopathy or thryomegaly appreciated. Cor: PMI nondisplaced. Regular rate & rhythm. No rubs, gallops or murmurs. Lungs: Decreased airflow  Wheezes bilaterally   Abdomen: soft, nontender, nondistended. No hepatosplenomegaly. No bruits or masses. Good bowel sounds. Extremities: no cyanosis, clubbing, rash, edema Neuro: alert & oriented x 3, cranial nerves grossly intact. moves all 4 extremities w/o difficulty. Affect pleasant.  ECG:  As noted above    Results for orders placed or performed during the hospital encounter of 11/08/16 (from the past 24 hour(s))  CBC with Differential     Status: Abnormal   Collection Time: 11/08/16  8:57 AM  Result Value Ref Range   WBC 10.7 (H) 4.0 - 10.5 K/uL   RBC 3.05 (L) 3.87 - 5.11 MIL/uL   Hemoglobin 10.5 (L) 12.0 - 15.0 g/dL   HCT 32.2 (L) 36.0 - 46.0 %   MCV 105.6 (H) 78.0 - 100.0 fL   MCH 34.4 (H) 26.0 - 34.0 pg   MCHC 32.6 30.0 - 36.0 g/dL   RDW 15.2 11.5 - 15.5 %   Platelets 290 150 - 400 K/uL   Neutrophils Relative % 88 %   Neutro Abs 9.3 (H) 1.7 - 7.7 K/uL   Lymphocytes Relative 6 %   Lymphs Abs 0.7 0.7 - 4.0 K/uL   Monocytes Relative 6 %  Monocytes Absolute 0.7 0.1 - 1.0 K/uL   Eosinophils  Relative 0 %   Eosinophils Absolute 0.0 0.0 - 0.7 K/uL   Basophils Relative 0 %   Basophils Absolute 0.0 0.0 - 0.1 K/uL  Basic metabolic panel     Status: Abnormal   Collection Time: 11/08/16  8:57 AM  Result Value Ref Range   Sodium 144 135 - 145 mmol/L   Potassium 3.5 3.5 - 5.1 mmol/L   Chloride 107 101 - 111 mmol/L   CO2 29 22 - 32 mmol/L   Glucose, Bld 112 (H) 65 - 99 mg/dL   BUN 28 (H) 6 - 20 mg/dL   Creatinine, Ser 1.94 (H) 0.44 - 1.00 mg/dL   Calcium 7.8 (L) 8.9 - 10.3 mg/dL   GFR calc non Af Amer 24 (L) >60 mL/min   GFR calc Af Amer 27 (L) >60 mL/min   Anion gap 8 5 - 15  Troponin I     Status: Abnormal   Collection Time: 11/08/16  8:57 AM  Result Value Ref Range   Troponin I 0.04 (HH) <0.03 ng/mL  Troponin I     Status: Abnormal   Collection Time: 11/08/16 12:08 PM  Result Value Ref Range   Troponin I 0.05 (HH) <0.03 ng/mL  Blood gas, arterial     Status: Abnormal   Collection Time: 11/08/16  4:00 PM  Result Value Ref Range   O2 Content 2.0 L/min   Delivery systems NASAL CANNULA    pH, Arterial 7.385 7.350 - 7.450   pCO2 arterial 43.2 32.0 - 48.0 mmHg   pO2, Arterial 77.9 (L) 83.0 - 108.0 mmHg   Bicarbonate 24.9 20.0 - 28.0 mmol/L   Acid-Base Excess 0.8 0.0 - 2.0 mmol/L   O2 Saturation 94.1 %   Patient temperature 37.0    Collection site RIGHT RADIAL    Drawn by [id number]    Sample type ARTERIAL DRAW    Allens test (pass/fail) PASS PASS   Dg Chest 2 View  Result Date: 11/08/2016 CLINICAL DATA:  80 year old female with chest pain and shortness of breath since yesterday. Initial encounter. Personal history of colon cancer. EXAM: CHEST  2 VIEW COMPARISON:  CT Abdomen and Pelvis 05/22/2016. Chest radiographs 05/22/2016 and earlier. FINDINGS: Upright AP and lateral views of the chest. New small bilateral pleural effusions. Stable mild cardiomegaly. Other mediastinal contours are within normal limits. Visualized tracheal air column is within normal limits. No  pneumothorax. No consolidation. Chronic diffuse increased pulmonary interstitium appears not significantly changed. No acute edema suspected. Osteopenia. Calcified aortic atherosclerosis. No acute osseous abnormality identified. Negative visible bowel gas pattern. IMPRESSION: New small bilateral pleural effusions. No other acute cardiopulmonary abnormality. Electronically Signed   By: Genevie Ann M.D.   On: 11/08/2016 10:57     ASSESSMENT:  Patient is a 80 yo who has no prior cardiac history  Presents with increased SOB   In SR with freq PVCs   Initally admitted to tele for Rx of COPD flare  Transferred to ICU for NSVT Bedside echo (limited views) show LVEF depressed at approximately 30%   Tele with PVCs, couplets, triplets  Recomm:  I have reviewed with Dr Charlies Silvers She would do best to be transferred to [facility] For now I would recomm LAsix x 1 80 mg  Watch response  Some of wheezing may represent fluid I would also recomm lidocaine to decrease ectopy  75 mg IV over 10 min then 1 mg /min. Cardiology will follow  during admission  Would recomm full echo at [facility] when ectopy improves.   Pt understands above  Agrees with plan

## 2016-11-08 NOTE — Progress Notes (Addendum)
Patient with increased SOB and wheezing after PT eval.  BP remains high - PO norvasc and solumedrol given.  EKG completed.  Tele called with alerts to bi & trigeminy and runs of V tach.  Patient states that she "feels like the air can't get in"  O2 sats 93% on 2L O2 .  MD paged via text page.  Called CCM on call. Order placed for ABG. Transfer to ICU order placed.  Leisa Lenz [facility] [id number]

## 2016-11-08 NOTE — Evaluation (Deleted)
Physical Therapy Evaluation Patient Details Name: Meghan Anderson MRN: [id number] DOB: 02-10-1936 Today's Date: 11/08/2016   History of Present Illness  HPI: Meghan Anderson is a 80 y.o. female with medical history significant for hypothyroidism, GERD, COPD, chronic kidney disease stage III. Patient presented with worsening shortness of breath started for past 1 day prior to this admission. Patient reported feeling short of breath at rest and with exertion. She reported no associated cough. No fevers or chills. She did use inhaler at home with no significant symptomatic relief. No reports of chest pain. No palpitations. The nebulizers given in ED have provided some symptomatic relief. No reports of abdominal pain, nausea or vomiting. No lightheadedness or loss of consciousness.  Clinical Impression  Pt lives alone at home she reports that she would not have come to the ER had it not been for her back pain which is chronic. Pt states that she feels weak.  Pt does not drive and has not driven due to poor eye sight therefore home health PT is recommended to improve balance, core strength and overall safety while ambulating.     Follow Up Recommendations Home health PT    Equipment Recommendations  None recommended by PT    Recommendations for Other Services       Precautions / Restrictions Precautions Precautions: None Restrictions Weight Bearing Restrictions: No      Mobility  Bed Mobility Overal bed mobility: Modified Independent                Transfers Overall transfer level: Modified independent                  Ambulation/Gait Ambulation/Gait assistance: Modified independent (Device/Increase time) Ambulation Distance (Feet): 90 Feet Assistive device: Rolling walker (2 wheeled) Gait Pattern/deviations: WFL(Within Functional Limits)   Gait velocity interpretation: Below normal speed for age/gender    Stairs            Wheelchair Mobility     Modified Rankin (Stroke Patients Only)       Balance                                             Pertinent Vitals/Pain Pain Assessment: 0-10 Pain Score: 6  Pain Location: back radiating into her legs  Pain Descriptors / Indicators: Aching;Burning Pain Intervention(s): Limited activity within patient's tolerance    Home Living Family/patient expects to be discharged to:: Private residence Living Arrangements: Alone Available Help at Discharge: Family Type of Home: House Home Access: Ramped entrance     Home Layout: One level Home Equipment: Environmental consultant - 2 wheels      Prior Function Level of Independence: Independent         Comments: occasionally uses a rolling walker      Hand Dominance        Extremity/Trunk Assessment   Upper Extremity Assessment Upper Extremity Assessment: Defer to OT evaluation    Lower Extremity Assessment Lower Extremity Assessment: Generalized weakness    Cervical / Trunk Assessment Cervical / Trunk Assessment: Normal  Communication   Communication: No difficulties  Cognition Arousal/Alertness: Awake/alert Behavior During Therapy: WFL for tasks assessed/performed Overall Cognitive Status: Within Functional Limits for tasks assessed                      General Comments      Exercises  Assessment/Plan    PT Assessment Patient needs continued PT services  PT Problem List Decreased strength;Decreased activity tolerance;Decreased balance;Pain          PT Treatment Interventions Therapeutic activities;Gait training;Therapeutic exercise    PT Goals (Current goals can be found in the Care Plan section)  Acute Rehab PT Goals PT Goal Formulation: With patient Time For Goal Achievement: 11/11/16 Potential to Achieve Goals: Good    Frequency Min 3X/week   Barriers to discharge        Co-evaluation               End of Session Equipment Utilized During Treatment: Gait  belt Activity Tolerance: Patient tolerated treatment well Patient left: in chair;with call bell/phone within reach      Functional Limitation: Mobility: Walking and moving around Mobility: Walking and Moving Around Current Status [id number]): At least 40 percent but less than 60 percent impaired, limited or restricted Mobility: Walking and Moving Around Goal Status [phone]): At least 40 percent but less than 60 percent impaired, limited or restricted Mobility: Walking and Moving Around Discharge Status [phone]): At least 40 percent but less than 60 percent impaired, limited or restricted    Time: 1355-1420 PT Time Calculation (min) (ACUTE ONLY): 25 min   Charges:         PT G Codes:   PT G-Codes **NOT FOR INPATIENT CLASS** Functional Limitation: Mobility: Walking and moving around Mobility: Walking and Moving Around Current Status [id number]): At least 40 percent but less than 60 percent impaired, limited or restricted Mobility: Walking and Moving Around Goal Status [phone]): At least 40 percent but less than 60 percent impaired, limited or restricted Mobility: Walking and Moving Around Discharge Status [phone]): At least 40 percent but less than 60 percent impaired, limited or restricted  Rayetta Humphrey, PT CLT [phone] 11/08/2016, 2:20 PM

## 2016-11-08 NOTE — Progress Notes (Signed)
**Note De-identified Shelaine Frie Obfuscation** EKG complete; results reported to RN 

## 2016-11-08 NOTE — H&P (Addendum)
History and Physical    Meghan Anderson [id number] DOB: 1936-04-28 DOA: 11/08/2016  Referring MD/NP/PA: Dr. Nat Christen  PCP: Fransisca Kaufmann Dettinger, MD   Patient coming from: Home  Chief Complaint: Shortness of breath  HPI: Meghan Anderson is a 80 y.o. female with medical history significant for hypothyroidism, GERD, COPD, chronic kidney disease stage III. Patient presented with worsening shortness of breath started for past 1 day prior to this admission. Patient reported feeling short of breath at rest and with exertion. She reported no associated cough. No fevers or chills. She did use inhaler at home with no significant symptomatic relief. No reports of chest pain. No palpitations. The nebulizers given in ED have provided some symptomatic relief. No reports of abdominal pain, nausea or vomiting. No lightheadedness or loss of consciousness.  ED Course: In ED, patient was hemodynamically stable her respiration was between 22 and 23, afebrile. Blood work was notable for leukocytosis of 10.7, hemoglobin 10.5, creatinine 1.94 and mild elevation in troponin level 0.04. Her 12-lead EKG showed atrial fibrillation. In ED she was given bolus normal saline 500 mL, Solu-Medrol 125 mg IV 1 time dose, duoneb nebulizer 2. She continued to be to keep headache and short of breath for which reason she was admitted for further management of COPD exacerbation.    Review of Systems:  Constitutional: Negative for fever, chills, diaphoresis, activity change, appetite change and fatigue.  HENT: Negative for ear pain, nosebleeds, congestion, facial swelling, rhinorrhea, neck pain, neck stiffness and ear discharge.   Eyes: Negative for pain, discharge, redness, itching and visual disturbance.  Respiratory: per HPI   Cardiovascular: Negative for chest pain, palpitations and leg swelling.  Gastrointestinal: Negative for abdominal distention.  Genitourinary: Negative for dysuria, urgency, frequency, hematuria,  flank pain, decreased urine volume, difficulty urinating and dyspareunia.  Musculoskeletal: Negative for back pain, joint swelling, arthralgias and gait problem.  Neurological: Negative for dizziness, tremors, seizures, syncope, facial asymmetry, speech difficulty, weakness, light-headedness, numbness and headaches.  Hematological: Negative for adenopathy. Does not bruise/bleed easily.  Psychiatric/Behavioral: Negative for hallucinations, behavioral problems, confusion, dysphoric mood, decreased concentration and agitation.   Past Medical History:  Diagnosis Date  . Arthritis   . Cataract   . Chronic bronchitis ([place])   . Chronic renal disease, stage 3, moderately decreased glomerular filtration rate (GFR) between 30-59 mL/min/1.73 square meter 02/21/2016  . Complication of anesthesia   . COPD (chronic obstructive pulmonary disease) ([place])   . GERD (gastroesophageal reflux disease)   . Hyperlipidemia   . Hypertension   . Hypothyroidism   . Murmur, heart   . PONV (postoperative nausea and vomiting)   . Rectal adenocarcinoma ([place])   . Thyroid disease     Past Surgical History:  Procedure Laterality Date  . ABDOMINAL HYSTERECTOMY     complete  . COLONOSCOPY N/A 11/01/2015   RMR: Large fungating rectal mass precluded colonoscopy. Status post biopsy.   . COLONOSCOPY N/A 01/15/2016   Procedure: COLONOSCOPY;  Surgeon: [name], MD;  Location: [facility];  Service: Endoscopy;  Laterality: N/A;  130   . FLEXIBLE SIGMOIDOSCOPY N/A 05/17/2016   Procedure: FLEXIBLE SIGMOIDOSCOPY;  Surgeon: [name], MD;  Location: [facility];  Service: Endoscopy;  Laterality: N/A;  . Left ear    . TRANSANAL ENDOSCOPIC MICROSURGERY N/A 12/14/2015   Procedure: TRANSANAL ENDOSCOPIC MICROSURGERY OF RECTAL POLYP;  Surgeon: [name], MD;  Location: [facility] ORS;  Service: General;  Laterality: N/A;    Social history:  reports that  she quit smoking about 5 years ago. She has never used smokeless tobacco.  She reports that she does not drink alcohol or use drugs.  Ambulation: Ambulates without the assistance at baseline.  Allergies  Allergen Reactions  . Levofloxacin Other (See Comments)    Pt states that this medication was too strong and she could not take them.    . Moxifloxacin Rash  . Prednisone Itching and Rash  . Quinolones Rash    Family History  Problem Relation Age of Onset  . Kidney disease Mother     nephrectomy x 1  . COPD Brother   . Emphysema Brother   . Asthma Brother   . Cancer Brother 99    brain  . Colon cancer Neg Hx     Prior to Admission medications   Medication Sig Start Date End Date Taking? Authorizing Provider  albuterol (PROVENTIL) (2.5 MG/3ML) 0.083% nebulizer solution Take 2.5 mg by nebulization every 4 (four) hours as needed for wheezing or shortness of breath.   Yes Historical Provider, MD  atorvastatin (LIPITOR) 80 MG tablet Take 40 mg by mouth daily.   Yes Historical Provider, MD  azithromycin (ZITHROMAX) 250 MG tablet Take 250-500 mg by mouth daily. Pt is to take two tablets on day 1 and one tablet daily for the next four days. 11/05/16 11/10/16 Yes Historical Provider, MD  fluticasone furoate-vilanterol (BREO ELLIPTA) 100-25 MCG/INH AEPB Inhale 1 puff into the lungs daily.   Yes Historical Provider, MD  ibuprofen (ADVIL,MOTRIN) 800 MG tablet Take 800 mg by mouth every 8 (eight) hours as needed for mild pain or moderate pain.   Yes Historical Provider, MD  levothyroxine (SYNTHROID, LEVOTHROID) 88 MCG tablet Take 1 tablet (88 mcg total) by mouth daily. 07/05/16  Yes Fransisca Kaufmann Dettinger, MD  lisinopril-hydrochlorothiazide (PRINZIDE,ZESTORETIC) 20-12.5 MG tablet Take 2 tablets by mouth daily. 07/05/16  Yes Fransisca Kaufmann Dettinger, MD  omeprazole (PRILOSEC) 40 MG capsule Take 40 mg by mouth daily.   Yes Historical Provider, MD  potassium chloride SA (K-DUR,KLOR-CON) 20 MEQ tablet Take 10 mEq by mouth daily.    Yes Historical Provider, MD  predniSONE (DELTASONE)  10 MG tablet Take 10-40 mg by mouth daily with breakfast. Pt is to take as a taper:    4 tablets for three days, 3 tablets for three days, 2 tablets for three days, 1 tablet for three days, then STOP. 11/05/16 11/20/16 Yes Historical Provider, MD  temazepam (RESTORIL) 15 MG capsule Take 1 capsule (15 mg total) by mouth at bedtime as needed for sleep. 09/02/16  Yes Manon Hilding Kefalas, PA-C  tiotropium (SPIRIVA HANDIHALER) 18 MCG inhalation capsule Place 1 capsule (18 mcg total) into inhaler and inhale daily. 06/29/15  Yes [name], MD    Physical Exam: Vitals:   11/08/16 1045 11/08/16 1100 11/08/16 1115 11/08/16 1129  BP:  163/80    Pulse: 87 (!) 44 (!) 44 96  Resp: (!) 30 25 25  (!) 28  Temp:      TempSrc:      SpO2: 97% 99% 98% 96%  Weight:      Height:        Constitutional: NAD, calm, comfortable Vitals:   11/08/16 1045 11/08/16 1100 11/08/16 1115 11/08/16 1129  BP:  163/80    Pulse: 87 (!) 44 (!) 44 96  Resp: (!) 30 25 25  (!) 28  Temp:      TempSrc:      SpO2: 97% 99% 98% 96%  Weight:  Height:       Eyes: PERRL, lids and conjunctivae normal ENMT: Mucous membranes are moist. Posterior pharynx clear of any exudate or lesions.Normal dentition.  Neck: normal, supple, no masses, no thyromegaly Respiratory: Wheezing in upper lung lobes, coarse breath sounds Cardiovascular: Appreciate S1-S2, rate controlled Abdomen: no tenderness, no masses palpated. No hepatosplenomegaly. Bowel sounds positive.  Musculoskeletal: no clubbing / cyanosis. No joint deformity upper and lower extremities. Good ROM, no contractures. Normal muscle tone.  Skin: no rashes, lesions, ulcers. No induration Neurologic: CN 2-12 grossly intact. Sensation intact, DTR normal. Strength 5/5 in all 4.  Psychiatric: Normal judgment and insight. Alert and oriented x 3. Normal mood.    Labs on Admission: I have personally reviewed following labs and imaging studies  CBC:  Recent Labs Lab  11/08/16 0857  WBC 10.7*  NEUTROABS 9.3*  HGB 10.5*  HCT 32.2*  MCV 105.6*  PLT Q000111Q   Basic Metabolic Panel:  Recent Labs Lab 11/08/16 0857  NA 144  K 3.5  CL 107  CO2 29  GLUCOSE 112*  BUN 28*  CREATININE 1.94*  CALCIUM 7.8*   GFR: Estimated Creatinine Clearance: 19.8 mL/min (by C-G formula based on SCr of 1.94 mg/dL (H)). Liver Function Tests: No results for input(s): AST, ALT, ALKPHOS, BILITOT, PROT, ALBUMIN in the last 168 hours. No results for input(s): LIPASE, AMYLASE in the last 168 hours. No results for input(s): AMMONIA in the last 168 hours. Coagulation Profile: No results for input(s): INR, PROTIME in the last 168 hours. Cardiac Enzymes:  Recent Labs Lab 11/08/16 0857  TROPONINI 0.04*   BNP (last 3 results) No results for input(s): PROBNP in the last 8760 hours. HbA1C: No results for input(s): HGBA1C in the last 72 hours. CBG: No results for input(s): GLUCAP in the last 168 hours. Lipid Profile: No results for input(s): CHOL, HDL, LDLCALC, TRIG, CHOLHDL, LDLDIRECT in the last 72 hours. Thyroid Function Tests: No results for input(s): TSH, T4TOTAL, FREET4, T3FREE, THYROIDAB in the last 72 hours. Anemia Panel: No results for input(s): VITAMINB12, FOLATE, FERRITIN, TIBC, IRON, RETICCTPCT in the last 72 hours. Urine analysis:    Component Value Date/Time   COLORURINE YELLOW 05/22/2016 2232   APPEARANCEUR CLEAR 05/22/2016 2232   LABSPEC 1.025 05/22/2016 2232   PHURINE 6.0 05/22/2016 2232   GLUCOSEU NEGATIVE 05/22/2016 2232   HGBUR SMALL (A) 05/22/2016 2232   BILIRUBINUR NEGATIVE 05/22/2016 2232   KETONESUR NEGATIVE 05/22/2016 2232   PROTEINUR >300 (A) 05/22/2016 2232   NITRITE NEGATIVE 05/22/2016 2232   LEUKOCYTESUR NEGATIVE 05/22/2016 2232   Sepsis Labs: @LABRCNTIP (procalcitonin:4,lacticidven:4) )No results found for this or any previous visit (from the past 240 hour(s)).   Radiological Exams on Admission: Dg Chest 2 View Result Date:  11/08/2016 New small bilateral pleural effusions. No other acute cardiopulmonary abnormality.    EKG: Independently reviewed. Sinus rhythm with PVC's  Assessment/Plan  Active Problems: Acute respiratory failure with hypoxia / Acute COPD exacerbation - Will continue oxygen support via [place] to keep O2 sats above 90% - Started Atrovent and xopenex every 6 hours scheduled and duoneb every 2 hours PRN shortness of breath or wheezing - Started solumedrol 40 mg IV Q 12 hours - Appreciate pulmonary consult and recommendations  - Admission to telemetry   Essential hypertension - Started Norvasc 10 mg daily - Hold prinzide due to renal insufficiency - Added hydralazine 5 mg IV every 6 hours PRN for blood pressure above 150/90  CKD stage 3 - Baseline Cr about 3 months  ago was 1.83 - Cr on this admission 1.9 - Monitor renal function daily   Mild troponin elevation - Likely demand ischemia from CKD - No reports of chest pain - On 12 lead EKG sinus rhythm with PVC's  Anemia of chronic disease - Hemoglobin 10.5 - Monitor CBC daily   Hypothyroidism - Continue synthroid   Dyslipidemia - Continue Lipitor     DVT prophylaxis: SCD's bilaterally  Code Status: Full code  Family Communication: No family at the bedside Disposition Plan: Admission to telemetry Consults called: Cardiology, pulmonary  Admission status: observation. Patient will require at least 1-2 days observation. She presented with worsening shortness of breath for past 1 day prior to this admission. She was given Solu-Medrol and nebulizer treatments in ED and continued to be hypoxic. She required nasal cannula oxygen support to keep oxygen saturation 93%. In addition her troponin level is mildly elevated and we will cycle cardiac enzymes. We will also have to monitor creatinine. If she feels better she most likely will be able to go home tomorrow.   Leisa Lenz MD [facility] Pager [phone]  If 7PM-7AM,  please contact night-coverage [email or web address] Password [facility]  11/08/2016, 12:17 PM

## 2016-11-08 NOTE — ED Triage Notes (Signed)
Pt brought in by [facility] from home c/o SOB that started yesterday around noon. Pt has taken 1 neb and rescue inhaler prior to EMS arrival. O2 sats 92% on RA at home. Duoneb given by EMS. IV attempt unsuccessful. O2 sats 97% on NRB. EMS reports pt has bigeminy and trigeminy on monitor. Pt alert during triage, labored breathing. Pt reports she saw her lung MD Tuesday and was given Z-Pak and a shot of something.

## 2016-11-08 NOTE — ED Notes (Signed)
Attempted to call report x 1  

## 2016-11-09 ENCOUNTER — Inpatient Hospital Stay (HOSPITAL_COMMUNITY): Payer: Medicare Other

## 2016-11-09 DIAGNOSIS — I5021 Acute systolic (congestive) heart failure: Secondary | ICD-10-CM

## 2016-11-09 DIAGNOSIS — J9601 Acute respiratory failure with hypoxia: Secondary | ICD-10-CM

## 2016-11-09 LAB — MRSA PCR SCREENING: MRSA by PCR: NEGATIVE

## 2016-11-09 LAB — GLUCOSE, CAPILLARY
GLUCOSE-CAPILLARY: 120 mg/dL — AB (ref 65–99)
Glucose-Capillary: 119 mg/dL — ABNORMAL HIGH (ref 65–99)
Glucose-Capillary: 129 mg/dL — ABNORMAL HIGH (ref 65–99)

## 2016-11-09 LAB — MAGNESIUM: Magnesium: 1.5 mg/dL — ABNORMAL LOW (ref 1.7–2.4)

## 2016-11-09 LAB — TSH: TSH: 103.897 u[IU]/mL — ABNORMAL HIGH (ref 0.350–4.500)

## 2016-11-09 NOTE — Progress Notes (Addendum)
eLink Physician-Brief Progress Note Patient Name: Meghan Anderson DOB: January 20, 1936 MRN: [id number]   Date of Service  11/09/2016  HPI/Events of Note  Projectile vomiting X 1.  eICU Interventions  Will order: 1. NPO. 2. NGT to LIS.  3. Abd film to assess NGT position.      Intervention Category Intermediate Interventions: Other:  Akiva Josey Cornelia Copa 11/09/2016, 10:07 PM Ab

## 2016-11-09 NOTE — Progress Notes (Signed)
Patient Name: Meghan Anderson Date of Encounter: 11/09/2016  Primary Cardiologist: new  Hospital Problem List     Principal Problem:   Acute respiratory failure with hypoxia [facility]) Active Problems:   Essential hypertension, benign   Hyperlipidemia   Hypothyroidism   Chronic renal disease, stage 3, moderately decreased glomerular filtration rate (GFR) between 30-59 mL/min/1.73 square meter   COPD with acute exacerbation (HCC)   Troponin level elevated     Subjective   Feeling much improved currently without chest pain.  Continued SOB.  Inpatient Medications    Scheduled Meds: . amLODipine  10 mg Oral Daily  . atorvastatin  40 mg Oral q1800  . ipratropium  0.5 mg Nebulization QID  . levalbuterol  0.63 mg Nebulization QID  . levothyroxine  88 mcg Oral QAC breakfast  . methylPREDNISolone (SOLU-MEDROL) injection  40 mg Intravenous Q12H  . pantoprazole  40 mg Oral Daily  . potassium chloride SA  10 mEq Oral Daily   Continuous Infusions: . lidocaine 1 mg/min (11/08/16 2200)   PRN Meds: acetaminophen **OR** acetaminophen, hydrALAZINE, ipratropium-albuterol, ondansetron **OR** ondansetron (ZOFRAN) IV, temazepam   Vital Signs    Vitals:   11/09/16 1024 11/09/16 1025 11/09/16 1027 11/09/16 1157  BP:      Pulse: 80  78   Resp: (!) 23  20   Temp:    98.1 F (36.7 C)  TempSrc:    Oral  SpO2: (!) 87% 94% 95%   Weight:      Height:        Intake/Output Summary (Last 24 hours) at 11/09/16 1226 Last data filed at 11/09/16 1130  Gross per 24 hour  Intake              630 ml  Output             2600 ml  Net            -1970 ml   Filed Weights   11/08/16 1343 11/09/16 0100 11/09/16 0400  Weight: 131 lb (59.4 kg) 134 lb 0.6 oz (60.8 kg) 133 lb 2.5 oz (60.4 kg)    Physical Exam    GEN: Well nourished, well developed, in no acute distress.  HEENT: Grossly normal.  Neck: Supple, no JVD, carotid bruits, or masses. Cardiac: RRR, no murmurs, rubs, or gallops. No  clubbing, cyanosis, edema.  Radials/DP/PT 2+ and equal bilaterally.  Respiratory:  Respirations regular and unlabored, significant wheezing throughout. GI: Soft, nontender, nondistended, BS + x 4. MS: no deformity or atrophy. Skin: warm and dry, no rash. Neuro:  Strength and sensation are intact. Psych: AAOx3.  Normal affect.  Labs    CBC  Recent Labs  11/08/16 0857  WBC 10.7*  NEUTROABS 9.3*  HGB 10.5*  HCT 32.2*  MCV 105.6*  PLT Q000111Q   Basic Metabolic Panel  Recent Labs  11/08/16 0857 11/09/16 0237  NA 144  --   K 3.5  --   CL 107  --   CO2 29  --   GLUCOSE 112*  --   BUN 28*  --   CREATININE 1.94*  --   CALCIUM 7.8*  --   MG  --  1.5*   Liver Function Tests No results for input(s): AST, ALT, ALKPHOS, BILITOT, PROT, ALBUMIN in the last 72 hours. No results for input(s): LIPASE, AMYLASE in the last 72 hours. Cardiac Enzymes  Recent Labs  11/08/16 1208 11/08/16 1628 11/08/16 2247  TROPONINI 0.05* 0.04* 0.05*  BNP Invalid input(s): POCBNP D-Dimer No results for input(s): DDIMER in the last 72 hours. Hemoglobin A1C No results for input(s): HGBA1C in the last 72 hours. Fasting Lipid Panel No results for input(s): CHOL, HDL, LDLCALC, TRIG, CHOLHDL, LDLDIRECT in the last 72 hours. Thyroid Function Tests  Recent Labs  11/09/16 0237  TSH 103.897*    Telemetry    Sinus rhythm with occasional PVCs, significantly decreased ventricular ectopy since initiation of lidocaine - Personally Reviewed  ECG    Sinus rhythm with PVCs - Personally Reviewed  Radiology    Dg Chest 2 View  Result Date: 11/08/2016 CLINICAL DATA:  80 year old female with chest pain and shortness of breath since yesterday. Initial encounter. Personal history of colon cancer. EXAM: CHEST  2 VIEW COMPARISON:  CT Abdomen and Pelvis 05/22/2016. Chest radiographs 05/22/2016 and earlier. FINDINGS: Upright AP and lateral views of the chest. New small bilateral pleural effusions. Stable  mild cardiomegaly. Other mediastinal contours are within normal limits. Visualized tracheal air column is within normal limits. No pneumothorax. No consolidation. Chronic diffuse increased pulmonary interstitium appears not significantly changed. No acute edema suspected. Osteopenia. Calcified aortic atherosclerosis. No acute osseous abnormality identified. Negative visible bowel gas pattern. IMPRESSION: New small bilateral pleural effusions. No other acute cardiopulmonary abnormality. Electronically Signed   By: Genevie Ann M.D.   On: 11/08/2016 10:57    Cardiac Studies   TTE pending  Patient Profile     Patient is 80, with history of advanced COPD and CKD, IIA (T3N0M0) rectal adenocarcinoma, S/P resection of rectal mass by Dr. Marcello Moores on 12/14/2015, now S/P concomitant chemoradiation, HLD recently transferred from [facility] after presenting there for chest pain and shortness of breath.   Assessment & Plan    1. NSVT: significantly less ventricular ectopy after starting lidocaine.  Meghan Anderson continue at the current dose for now. May transition to Mexitil post TTE.  2. Chest pain: has elevated troponin to 0.05. Meghan Anderson need ischemic workup, myoview vs cath pending TTE  3. COPD: continued wheezing, per pulmonary  Signed, Caleb Decock Meredith Leeds, MD  11/09/2016, 12:26 PM

## 2016-11-09 NOTE — Progress Notes (Signed)
Initial Nutrition Assessment  DOCUMENTATION CODES:   Not applicable  INTERVENTION:  Provide snacks po BID between meals (fruit with peanut butter and toast). RD to order.   NUTRITION DIAGNOSIS:   Increased nutrient needs related to catabolic illness (COPD) as evidenced by estimated needs.  GOAL:   Patient will meet greater than or equal to 90% of their needs  MONITOR:   PO intake, Labs, Weight trends, I & O's  REASON FOR ASSESSMENT:   Consult COPD Protocol  ASSESSMENT:   30F w/ PMH significant for COPD, HTN, HLD, GERD, hypothyroidism who presents as a transfer from [facility] [place] for ventricular ectopy, increased work of breathing and finding suggestive of a newly identified cardiomyopathy. CXR notable for small bilateral pleural effusions.   Spoke with patient and family members at bedside. Patient reports her appetite is okay. She has 1-2 meals per day at home brought to her by family members as she cannot cook for herself anymore. She usually has soup, chicken, potatoes with gravy, or ham biscuits. Patient reports she used to drink Ensure with her cancer treatments (last treatment August 2017 per report) but she does not like it.   Most recent UBW around 130 lbs. Prior to diagnosis patient reports UBW 140 lbs and that she lost to lowest weight of 119 lbs (was unable to provide time frame weight loss occurred over). She has been able to gain back weight slowly.   Meal Completion: 75% of Regular diet per chart and patient report  Medications reviewed and include: levothyroxine, methylprednisolone 40 mg Q12hrs, pantoprazole, potassium chloride 10 mEq daily.  Labs reviewed: CBG 119-129, BUN 28, Creatinine 1.94, Magnesium 1.5.   Discussed with RN.   Diet Order:  Diet regular Room service appropriate? Yes; Fluid consistency: Thin  Skin:  Reviewed, no issues  Last BM:  11/06/2016  Height:   Ht Readings from Last 1 Encounters:  11/09/16 5\' 1"  (1.549 m)    Weight:   Wt Readings  from Last 1 Encounters:  11/09/16 133 lb 2.5 oz (60.4 kg)    Ideal Body Weight:  47.7 kg  BMI:  Body mass index is 25.16 kg/m.  Estimated Nutritional Needs:   Kcal:  1235-1435 (MSJ x 1.2-1.4)  Protein:  70-85 grams (1.2-1.4 grams/kg)  Fluid:  >/= 1.5 L/day (25 ml/kg)  EDUCATION NEEDS:   No education needs identified at this time  Willey Blade, MS, RD, LDN Pager: [phone] After Hours Pager: [phone]

## 2016-11-09 NOTE — Clinical Social Work Note (Signed)
Per PT note recommendation is now home with home health. Clinical Social Worker will sign off for now as social work intervention is no longer needed. Please consult [place] again if new need arises.   [address] [place]

## 2016-11-09 NOTE — Progress Notes (Signed)
PULMONARY / CRITICAL CARE MEDICINE   Name: Meghan Anderson MRN: [id number] DOB: 04-28-1936    ADMISSION DATE:  11/08/2016 CONSULTATION DATE:    Meghan Anderson:  [facility]  CHIEF COMPLAINT:  Shortness of breath  HISTORY OF PRESENT ILLNESS:   Meghan Anderson is a 77F with PMH significant for COPD, CKD III, GERD, HLD, HTN, prior rectal adenoca and hypothyroidism. She was admitted to [facility] on 12/29 with complaints of worsening shortness of breath x 1 day. She was dyspneic both with exertion and at rest. She denied associated cough, sputum, fever, chills, chest pain, palpitations. On presentation she was noted to be hypoxemic. She had a mild troponin leak at 0.05.  Within a few hours, she had increasing shortness of breath and wheezing, persistent hypertension and telemetry reported arrhythmias (bigeminy, trigeminy, brief runs of V tach). She was transferred to ICU. She was seen by cardiology with bedside echo showing EF ~30%. She was started on lidocaine, given iv lasix and transfer to [facility] was recommended. She was also started on BiPAP for work of breathing.   Currently she is resting comfortably and saturating 96% on 1L O2. She is able to speak in complete sentences and says that her breathing feels much better. She has been short of breath for several days (since 12/26), but had an acute worsening the morning of 12/29. She had gone to her pulmonologist several days ago to get clearance for back surgery and that physician felt that she was acutely exacerbated and gave her a kenalog shot, prednisone and an antibiotic shot. Despite those interventions, she failed to improve. She is usually maintained on Breo, Spiriva and prn nebs. She has never had heart trouble that she can recall. She has some discomfort in her abdomen and back pain that radiates to her left leg, but otherwise has no complaints. She denies fever / chills / sputum / hemoptysis / palpitations / numbness / tingling / nausea /  vomiting / diarrhea / urinary issues.  SUBJECTIVE:  Off BiPAP and up to chair Lidocaine gtt running  VITAL SIGNS: BP (!) 144/90   Pulse 78   Temp 98.3 F (36.8 C) (Oral)   Resp 20   Ht 5\' 1"  (1.549 m)   Wt 60.4 kg (133 lb 2.5 oz)   SpO2 95%   BMI 25.16 kg/m   HEMODYNAMICS:   VENTILATOR SETTINGS:   INTAKE / OUTPUT: I/O last 3 completed shifts: In: 710 [I.V.:210; IV Piggyback:500] Out: 2350 [Urine:2350]  PHYSICAL EXAMINATION:  General Well nourished, well developed, no apparent distress, up to chair  HEENT No gross abnormalities. Oropharynx clear. Mallampati III, good dentition.  Pulmonary Coarse breath sounds bilaterally with prolonged expiratory phase and expiratory wheezes throughout.   Cardiovascular Normal rate, regular rhythm. S1, s2. No m/r/g. Distal pulses palpable.  Abdomen Soft, mild diffuse tenderness to palpation, no guarding or rebound, non-distended, positive bowel sounds, no palpable organomegaly or masses. Normoresonant to percussion.  Musculoskeletal Grossly normal  Lymphatics No cervical, supraclavicular or axillary adenopathy.   Neurologic Grossly intact. No focal deficits.   Skin/Integuement No rash, no cyanosis, no clubbing. No edema.     LABS:  BMET  Recent Labs Lab 11/08/16 0857  NA 144  K 3.5  CL 107  CO2 29  BUN 28*  CREATININE 1.94*  GLUCOSE 112*    Electrolytes  Recent Labs Lab 11/08/16 0857 11/09/16 0237  CALCIUM 7.8*  --   MG  --  1.5*    CBC  Recent  Labs Lab 11/08/16 0857  WBC 10.7*  HGB 10.5*  HCT 32.2*  PLT 290    Coag's No results for input(s): APTT, INR in the last 168 hours.  Sepsis Markers No results for input(s): LATICACIDVEN, PROCALCITON, O2SATVEN in the last 168 hours.  ABG  Recent Labs Lab 11/08/16 1600  PHART 7.385  PCO2ART 43.2  PO2ART 77.9*    Liver Enzymes No results for input(s): AST, ALT, ALKPHOS, BILITOT, ALBUMIN in the last 168 hours.  Cardiac Enzymes  Recent Labs Lab  11/08/16 1208 11/08/16 1628 11/08/16 2247  TROPONINI 0.05* 0.04* 0.05*    Glucose  Recent Labs Lab 11/09/16 0036 11/09/16 0847  GLUCAP 119* 120*    Imaging No results found.  STUDIES:  Beside echo by cards at [facility]: EF ~30%  CULTURES: None  ANTIBIOTICS: None  SIGNIFICANT EVENTS: Transfer to ICU 12/29 Transfer to [facility] ICU 12/29  LINES/TUBES: PIV Foley 12/29 >>  DISCUSSION: [name] is a 61F w/ PMH significant for COPD, HTN, HLD, GERD, hypothyroidism who presents as a transfer from [facility] for ventricular ectopy, increased work of breathing and finding suggestive of a newly identified cardiomyopathy. Troponin has stayed ~ 0.05. CXR notable for small bilateral pleural effusions. Exam does demonstrate expiratory wheeze. Her respiratory symptoms are likely a combination of underlying COPD and new onset heart failure. Will check BNP, obtain formal TTE, continue steroids and nebs for now and await further cardiology input.   ASSESSMENT / PLAN:  PULMONARY A: Respiratory distress - likely secondary to CHF exacerbation + COPD P:   Continue supplemental O2 as needed to maintain sats > 88% Continue nebs Continue scheduled steroids BiPAP PRN for respiratory distress - she does not look like she will require further  CARDIOVASCULAR A:  Ventricular ectopy HTN ? Cardiomyopathy (TTE in 05/2015 w/ EF [id number], diastolic dysfunction) P:  Formal TTE pending monitor BP; prns for SBP > 180 Check BNP  RENAL A:   CKD III - Cr near baseline ~ 1.8 P:   Monitor Avoid nephrotoxins  GASTROINTESTINAL A:   Recent rectal adenoca s/p 5FU then Xeloda, currently off treatment P:   Bowel regimen  HEMATOLOGIC A:   No acute issues Chronic anemia - stable P:   INFECTIOUS A:   No acute issues P:     ENDOCRINE A:   Hypothyroid   P:   Continue synthroid  NEUROLOGIC A:   No acute issues P:   RASS goal: 0  Likely can transfer to SDU depending on the plans for lidocaine.  Will discuss with cardiology  FAMILY  - Updates: discussed status with sone and daughter at bedside 12/30  - Inter-disciplinary family meet or Palliative Care meeting due by:  day 7  The patient is critically ill with multiple organ system failure and requires high complexity decision making for assessment and support, frequent evaluation and titration of therapies, advanced monitoring, review of radiographic studies and interpretation of complex data.   Critical Care Time devoted to patient care services, exclusive of separately billable procedures, described in this note is 30 minutes.   Baltazar Apo, MD, PhD 11/09/2016, 11:14 AM [facility] [phone] or if no answer [phone]

## 2016-11-09 NOTE — Consult Note (Signed)
Primary Physician: [name], MD Primary Cardiologist:  New  Asked to see re ventricular ectopy, recently transferred from [facility]    HPI:  See original note by Dr. Harrington Challenger for full details.    Patient is 80, with history of advanced COPD and CKD, IIA (T3N0M0) rectal adenocarcinoma, S/P resection of rectal mass by Dr. Marcello Moores on 12/14/2015, now S/P concomitant chemoradiation, HLD recently transferred from [facility] after presenting there for chest pain and shortness of breath.   She had just seen her pulmonologist due to wheezing and received steroids and antibiotics to optimize her for an upcoming spine surgery (spurs per her son).  She then acutely yesterday AM had a combination of shortness of breath and chest pain-- the chest pain is very atypical for her.   On admission to [facility], ECG showed sinus rhythm with frequent PVCs; Troponin was mildly elevated to 0.05 and per telemetry data per Dr. Harrington Challenger' note, she developed more VT and was transferred to the ICU.  Originally on admissions he was given a fluid bolus; bedside echo was concerning for low EF and imaging for pulmonary edema so she was diuresed with IV lasix. For ectopy, she was started on a lidocaine drip at 80 after a 75 mg bolus.    On interview, she is pain free.  No ectopy on telemetry.  Troponin has stayed flat. Has made >400 cc UOP since 82 Iv lasix at 1800.  Now off Bipap, conversing and comfortable.    On ROS: no other chest pain issues prior to this, no N/V, palpiations, syncope. Main issue has been chronic COPD symptoms of SOB.    Past Medical History:  Diagnosis Date  . Arthritis   . Cataract   . Chronic bronchitis ([place])   . Chronic renal disease, stage 3, moderately decreased glomerular filtration rate (GFR) between 30-59 mL/min/1.73 square meter 02/21/2016  . Complication of anesthesia   . COPD (chronic obstructive pulmonary disease) ([place])   . GERD (gastroesophageal reflux disease)   . Hyperlipidemia   .  Hypertension   . Hypothyroidism   . Murmur, heart   . PONV (postoperative nausea and vomiting)   . Rectal adenocarcinoma ([place])   . Thyroid disease     Medications Prior to Admission  Medication Sig Dispense Refill  . albuterol (PROVENTIL) (2.5 MG/3ML) 0.083% nebulizer solution Take 2.5 mg by nebulization every 4 (four) hours as needed for wheezing or shortness of breath.    Marland Kitchen atorvastatin (LIPITOR) 80 MG tablet Take 40 mg by mouth daily.    Marland Kitchen azithromycin (ZITHROMAX) 250 MG tablet Take 250-500 mg by mouth daily. Pt is to take two tablets on day 1 and one tablet daily for the next four days.    . fluticasone furoate-vilanterol (BREO ELLIPTA) 100-25 MCG/INH AEPB Inhale 1 puff into the lungs daily.    Marland Kitchen ibuprofen (ADVIL,MOTRIN) 800 MG tablet Take 800 mg by mouth every 8 (eight) hours as needed for mild pain or moderate pain.    Marland Kitchen levothyroxine (SYNTHROID, LEVOTHROID) 88 MCG tablet Take 1 tablet (88 mcg total) by mouth daily. 90 tablet 1  . lisinopril-hydrochlorothiazide (PRINZIDE,ZESTORETIC) 20-12.5 MG tablet Take 2 tablets by mouth daily. 60 tablet 3  . omeprazole (PRILOSEC) 40 MG capsule Take 40 mg by mouth daily.    . potassium chloride SA (K-DUR,KLOR-CON) 20 MEQ tablet Take 10 mEq by mouth daily.     . predniSONE (DELTASONE) 10 MG tablet Take 10-40 mg by mouth daily with breakfast. Pt  is to take as a taper:    4 tablets for three days, 3 tablets for three days, 2 tablets for three days, 1 tablet for three days, then STOP.    Marland Kitchen temazepam (RESTORIL) 15 MG capsule Take 1 capsule (15 mg total) by mouth at bedtime as needed for sleep. 30 capsule 2  . tiotropium (SPIRIVA HANDIHALER) 18 MCG inhalation capsule Place 1 capsule (18 mcg total) into inhaler and inhale daily. 90 capsule 3     . amLODipine  10 mg Oral Daily  . atorvastatin  40 mg Oral q1800  . ipratropium  0.5 mg Nebulization QID  . levalbuterol  0.63 mg Nebulization QID  . levothyroxine  88 mcg Oral QAC breakfast  . mouth rinse   15 mL Mouth Rinse BID  . methylPREDNISolone (SOLU-MEDROL) injection  40 mg Intravenous Q12H  . pantoprazole  40 mg Oral Daily  . potassium chloride SA  10 mEq Oral Daily    Infusions: . lidocaine 1 mg/min (11/08/16 2200)    Allergies  Allergen Reactions  . Levofloxacin Other (See Comments)    Pt states that this medication was too strong and she could not take them.    . Moxifloxacin Rash  . Prednisone Itching and Rash  . Quinolones Rash    Social History   Social History  . Marital status: Widowed    Spouse name: N/A  . Number of children: N/A  . Years of education: N/A   Occupational History  . Not on file.   Social History Main Topics  . Smoking status: Former Smoker    Quit date: 11/11/2010  . Smokeless tobacco: Never Used  . Alcohol use No  . Drug use: No  . Sexual activity: Not on file   Other Topics Concern  . Not on file   Social History Narrative  . No narrative on file    Family History  Problem Relation Age of Onset  . Kidney disease Mother     nephrectomy x 1  . COPD Brother   . Emphysema Brother   . Asthma Brother   . Cancer Brother 55    brain  . Colon cancer Neg Hx     REVIEW OF SYSTEMS:  All systems reviewed  Negative to the above problem except as noted above.    PHYSICAL EXAM: Vitals:   11/09/16 0045 11/09/16 0100  BP: 125/89 131/87  Pulse: 72 71  Resp: 18 13  Temp:       Intake/Output Summary (Last 24 hours) at 11/09/16 0123 Last data filed at 11/09/16 0100  Gross per 24 hour  Intake              620 ml  Output             1700 ml  Net            -1080 ml    General: Comfortable, NAD  HEENT: normal Neck: supple.JVF to mid neck at 30 degrees, No lymphadenopathy or thryomegaly appreciated. Cor: Distant; Regular rate & rhythm. No rubs, gallops or murmurs. Lungs: Wheezes bilaterally   Abdomen: soft, nontender, nondistended. No hepatosplenomegaly. No bruits or masses. Good bowel sounds. Extremities: no cyanosis, clubbing,  rash, trivial edema in bilateral LE Neuro: alert & oriented x 3, cranial nerves grossly intact. moves all 4 extremities w/o difficulty. Affect pleasant.  ECG:  As noted above; appears to have underlying sinus rhythm with frequent PVCs/NSVT.  Ashman's on differential but favor true ventricular focus due  to presence of sinus p waves  Results for orders placed or performed during the hospital encounter of 11/08/16 (from the past 24 hour(s))  CBC with Differential     Status: Abnormal   Collection Time: 11/08/16  8:57 AM  Result Value Ref Range   WBC 10.7 (H) 4.0 - 10.5 K/uL   RBC 3.05 (L) 3.87 - 5.11 MIL/uL   Hemoglobin 10.5 (L) 12.0 - 15.0 g/dL   HCT 32.2 (L) 36.0 - 46.0 %   MCV 105.6 (H) 78.0 - 100.0 fL   MCH 34.4 (H) 26.0 - 34.0 pg   MCHC 32.6 30.0 - 36.0 g/dL   RDW 15.2 11.5 - 15.5 %   Platelets 290 150 - 400 K/uL   Neutrophils Relative % 88 %   Neutro Abs 9.3 (H) 1.7 - 7.7 K/uL   Lymphocytes Relative 6 %   Lymphs Abs 0.7 0.7 - 4.0 K/uL   Monocytes Relative 6 %   Monocytes Absolute 0.7 0.1 - 1.0 K/uL   Eosinophils Relative 0 %   Eosinophils Absolute 0.0 0.0 - 0.7 K/uL   Basophils Relative 0 %   Basophils Absolute 0.0 0.0 - 0.1 K/uL  Basic metabolic panel     Status: Abnormal   Collection Time: 11/08/16  8:57 AM  Result Value Ref Range   Sodium 144 135 - 145 mmol/L   Potassium 3.5 3.5 - 5.1 mmol/L   Chloride 107 101 - 111 mmol/L   CO2 29 22 - 32 mmol/L   Glucose, Bld 112 (H) 65 - 99 mg/dL   BUN 28 (H) 6 - 20 mg/dL   Creatinine, Ser 1.94 (H) 0.44 - 1.00 mg/dL   Calcium 7.8 (L) 8.9 - 10.3 mg/dL   GFR calc non Af Amer 24 (L) >60 mL/min   GFR calc Af Amer 27 (L) >60 mL/min   Anion gap 8 5 - 15  Troponin I     Status: Abnormal   Collection Time: 11/08/16  8:57 AM  Result Value Ref Range   Troponin I 0.04 (HH) <0.03 ng/mL  Troponin I     Status: Abnormal   Collection Time: 11/08/16 12:08 PM  Result Value Ref Range   Troponin I 0.05 (HH) <0.03 ng/mL  Blood gas, arterial      Status: Abnormal   Collection Time: 11/08/16  4:00 PM  Result Value Ref Range   O2 Content 2.0 L/min   Delivery systems NASAL CANNULA    pH, Arterial 7.385 7.350 - 7.450   pCO2 arterial 43.2 32.0 - 48.0 mmHg   pO2, Arterial 77.9 (L) 83.0 - 108.0 mmHg   Bicarbonate 24.9 20.0 - 28.0 mmol/L   Acid-Base Excess 0.8 0.0 - 2.0 mmol/L   O2 Saturation 94.1 %   Patient temperature 37.0    Collection site RIGHT RADIAL    Drawn by [id number]    Sample type ARTERIAL DRAW    Allens test (pass/fail) PASS PASS  MRSA PCR Screening     Status: None   Collection Time: 11/08/16  4:20 PM  Result Value Ref Range   MRSA by PCR NEGATIVE NEGATIVE  Troponin I (q 6hr x 3)     Status: Abnormal   Collection Time: 11/08/16  4:28 PM  Result Value Ref Range   Troponin I 0.04 (HH) <0.03 ng/mL  Troponin I (q 6hr x 3)     Status: Abnormal   Collection Time: 11/08/16 10:47 PM  Result Value Ref Range   Troponin I 0.05 (HH) <0.03 ng/mL  Glucose, capillary     Status: Abnormal   Collection Time: 11/09/16 12:36 AM  Result Value Ref Range   Glucose-Capillary 119 (H) 65 - 99 mg/dL   Comment 1 Notify RN    Comment 2 Document in Chart    Dg Chest 2 View  Result Date: 11/08/2016 CLINICAL DATA:  80 year old female with chest pain and shortness of breath since yesterday. Initial encounter. Personal history of colon cancer. EXAM: CHEST  2 VIEW COMPARISON:  CT Abdomen and Pelvis 05/22/2016. Chest radiographs 05/22/2016 and earlier. FINDINGS: Upright AP and lateral views of the chest. New small bilateral pleural effusions. Stable mild cardiomegaly. Other mediastinal contours are within normal limits. Visualized tracheal air column is within normal limits. No pneumothorax. No consolidation. Chronic diffuse increased pulmonary interstitium appears not significantly changed. No acute edema suspected. Osteopenia. Calcified aortic atherosclerosis. No acute osseous abnormality identified. Negative visible bowel gas pattern. IMPRESSION:  New small bilateral pleural effusions. No other acute cardiopulmonary abnormality. Electronically Signed   By: Genevie Ann M.D.   On: 11/08/2016 10:57     ASSESSMENT:  NSVT CKD COPD Chest Pain Minimal troponin elevation Report of low EF on bedside TTE  80 YO with no known CAD p/w chest pain and COPD exacerbation, with frequent ventricular ectopy, now subsided s/p lidocaine infusion.  Respiratory status improved.  Ashman's on differential but favor true ventricular focus due to presence of sinus p waves but may be worth discussing with EP.   Plan: - TTE in AM (ordered) - Continue lidocaine infusion at current rate-- would like to minimize ectopy for accurate EF assessment on echo, likely will down-titrate after echo - Pending echo, consider type and timing of ischemic evaluation. - Magnesium level and TSH added on; if grossly abnormal may be able to avoid ischemic evaluation - Ideally, replete K to 4 (conservatively given CKD) and Mg to 2 - Would continue diuresis for net negative 1 L daily given JVD/Chest xray with pulmonary edema, she responded to the 80 mg IV lasix well, can re-dose tomorrow AM

## 2016-11-09 NOTE — Progress Notes (Addendum)
eLink Physician-Brief Progress Note Patient Name: [facility]han Anderson DOB: 1936-04-05 MRN: [id number]   Date of Service  11/09/2016  HPI/Events of Note  COPD, CKD with new cardiomyopathy EF 30% tr from [facility] on bipap Lidocaine for VT  eICU Interventions  Appears stable on camera, CXR - BL effusions, NS stopped Cards called     Intervention Category Evaluation Type: New Patient Evaluation  ALVA,RAKESH V. 11/09/2016, 12:54 AM

## 2016-11-09 NOTE — Progress Notes (Signed)
Physical Therapy Treatment Patient Details Name: Meghan Anderson MRN: [id number] DOB: 12-12-1935 Today's Date: 11/09/2016    History of Present Illness HPI: Meghan Anderson is a 80 y.o. female with medical history significant for hypothyroidism, GERD, COPD, chronic kidney disease stage III. Patient presented with worsening shortness of breath started for past 1 day prior to this admission. Patient reported feeling short of breath at rest and with exertion. She reported no associated cough. No fevers or chills. She did use inhaler at home with no significant symptomatic relief. No reports of chest pain. No palpitations. The nebulizers given in ED have provided some symptomatic relief. No reports of abdominal pain, nausea or vomiting. No lightheadedness or loss of consciousness.    PT Comments    Pt transferred from [facility]. Pt lives alone but spoke with son who reports someone is always at her house and he is aware she needs 24/7 and that they can provide that. Pt did tolerate amb with RW this date and maintain SpO2 >91% on RA. Pt to benefit from HHPT to improved balance/decrease falls risk and regain independence.   Follow Up Recommendations  Home health PT;Supervision/Assistance - 24 hour     Equipment Recommendations  Rolling walker with 5" wheels    Recommendations for Other Services       Precautions / Restrictions Precautions Precautions: Fall Restrictions Weight Bearing Restrictions: No    Mobility  Bed Mobility Overal bed mobility: Needs Assistance Bed Mobility: Supine to Sit     Supine to sit: Supervision     General bed mobility comments: increased time, v/c's to pull up on bed rail to mimic home set up  Transfers Overall transfer level: Needs assistance Equipment used: Rolling walker (2 wheeled) Transfers: Sit to/from Stand Sit to Stand: Min guard         General transfer comment: v/c's for hand placement, to push up from bed, increased  time  Ambulation/Gait Ambulation/Gait assistance: Min guard Ambulation Distance (Feet): 120 Feet Assistive device: Rolling walker (2 wheeled) Gait Pattern/deviations: WFL(Within Functional Limits) Gait velocity: slow Gait velocity interpretation: Below normal speed for age/gender General Gait Details: mild SOB, SpO2 >91% on RA, occasional assist for walker management around obstacles   Stairs            Wheelchair Mobility    Modified Rankin (Stroke Patients Only)       Balance Overall balance assessment:  (benefits from RW for safe ambulation)                                  Cognition Arousal/Alertness: Awake/alert Behavior During Therapy: WFL for tasks assessed/performed Overall Cognitive Status: Within Functional Limits for tasks assessed                      Exercises      General Comments        Pertinent Vitals/Pain Pain Assessment: No/denies pain    Home Living                      Prior Function            PT Goals (current goals can now be found in the care plan section) Acute Rehab PT Goals Patient Stated Goal: home Progress towards PT goals: Progressing toward goals    Frequency    Min 3X/week      PT Plan Discharge plan  needs to be updated    Co-evaluation             End of Session Equipment Utilized During Treatment: Gait belt Activity Tolerance: Patient tolerated treatment well Patient left: in chair;with call bell/phone within reach;with chair alarm set;with family/visitor present     Time: [id number] PT Time Calculation (min) (ACUTE ONLY): 23 min  Charges:  $Gait Training: 23-37 mins                    G Codes:      Berline Lopes November 16, 2016, 11:37 AM   Kittie Plater, PT, DPT Pager #: [phone] Office #: [phone]

## 2016-11-09 NOTE — H&P (Signed)
PULMONARY / CRITICAL CARE MEDICINE   Name: Meghan Anderson MRN: [id number] DOB: November 21, 1935    ADMISSION DATE:  11/08/2016 CONSULTATION DATE:    Meghan Chance MD:  [facility]  CHIEF COMPLAINT:  Shortness of breath  HISTORY OF PRESENT ILLNESS:   Meghan Anderson is a 80F with PMH significant for COPD, CKD III, GERD, HLD, HTN, prior rectal adenoca and hypothyroidism. She was admitted to [facility] on 12/29 with complaints of worsening shortness of breath x 1 day. She was dyspneic both with exertion and at rest. She denied associated cough, sputum, fever, chills, chest pain, palpitations. On presentation she was noted to be hypoxemic. She had a mild troponin leak at 0.05.  Within a few hours, she had increasing shortness of breath and wheezing, persistent hypertension and telemetry reported arrhythmias (bigeminy, trigeminy, brief runs of V tach). She was transferred to ICU. She was seen by cardiology with bedside echo showing EF ~30%. She was started on lidocaine, given iv lasix and transfer to [facility] was recommended. She was also started on BiPAP for work of breathing.   Currently she is resting comfortably and saturating 96% on 1L O2. She is able to speak in complete sentences and says that her breathing feels much better. She has been short of breath for several days (since 12/26), but had an acute worsening the morning of 12/29. She had gone to her pulmonologist several days ago to get clearance for back surgery and that physician felt that she was acutely exacerbated and gave her a kenalog shot, prednisone and an antibiotic shot. Despite those interventions, she failed to improve. She is usually maintained on Breo, Spiriva and prn nebs. She has never had heart trouble that she can recall. She has some discomfort in her abdomen and back pain that radiates to her left leg, but otherwise has no complaints. She denies fever / chills / sputum / hemoptysis / palpitations / numbness / tingling / nausea /  vomiting / diarrhea / urinary issues.  PAST MEDICAL HISTORY :  She  has a past medical history of Arthritis; Cataract; Chronic bronchitis ([place]); Chronic renal disease, stage 3, moderately decreased glomerular filtration rate (GFR) between 30-59 mL/min/1.73 square meter (02/21/2016); Complication of anesthesia; COPD (chronic obstructive pulmonary disease) ([place]); GERD (gastroesophageal reflux disease); Hyperlipidemia; Hypertension; Hypothyroidism; Murmur, heart; PONV (postoperative nausea and vomiting); Rectal adenocarcinoma ([place]); and Thyroid disease.  PAST SURGICAL HISTORY: She  has a past surgical history that includes Left ear; Abdominal hysterectomy; Colonoscopy (N/A, 11/01/2015); Transanal endoscopic microsurgery (N/A, 12/14/2015); Colonoscopy (N/A, 01/15/2016); and Flexible sigmoidoscopy (N/A, 05/17/2016).  Allergies  Allergen Reactions  . Levofloxacin Other (See Comments)    Pt states that this medication was too strong and she could not take them.    . Moxifloxacin Rash  . Prednisone Itching and Rash  . Quinolones Rash    No current facility-administered medications on file prior to encounter.    Current Outpatient Prescriptions on File Prior to Encounter  Medication Sig  . levothyroxine (SYNTHROID, LEVOTHROID) 88 MCG tablet Take 1 tablet (88 mcg total) by mouth daily.  Marland Kitchen lisinopril-hydrochlorothiazide (PRINZIDE,ZESTORETIC) 20-12.5 MG tablet Take 2 tablets by mouth daily.  . temazepam (RESTORIL) 15 MG capsule Take 1 capsule (15 mg total) by mouth at bedtime as needed for sleep.  Marland Kitchen tiotropium (SPIRIVA HANDIHALER) 18 MCG inhalation capsule Place 1 capsule (18 mcg total) into inhaler and inhale daily.    FAMILY HISTORY:  Her indicated that her mother is deceased. She indicated that her father  is deceased. She indicated that her sister is alive. She indicated that three of her six brothers are alive. She indicated that the status of her neg hx is unknown.    SOCIAL HISTORY: She  reports  that she quit smoking about 6 years ago. She has never used smokeless tobacco. She reports that she does not drink alcohol or use drugs.  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 Oropharynx clear. Mallampati III, good dentition.  Pulmonary Coarse breath sounds bilaterally with prolonged expiratory phase and expiratory wheezes throughout. No rales or ronchi on anterior exam. Good effort, symmetrical expansion.   Cardiovascular Normal rate, regular rhythm. S1, s2. No m/r/g. Distal pulses palpable.  Abdomen Soft, mild diffuse tenderness to palpation, no guarding or rebound, non-distended, positive bowel sounds, no palpable organomegaly or masses. Normoresonant to percussion.  Musculoskeletal Grossly normal  Lymphatics No cervical, supraclavicular or axillary adenopathy.   Neurologic Grossly intact. No focal deficits.   Skin/Integuement No rash, no cyanosis, no clubbing. No  edema.     LABS:  BMET  Recent Labs Lab 11/08/16 0857  NA 144  K 3.5  CL 107  CO2 29  BUN 28*  CREATININE 1.94*  GLUCOSE 112*    Electrolytes  Recent Labs Lab 11/08/16 0857  CALCIUM 7.8*    CBC  Recent Labs Lab 11/08/16 0857  WBC 10.7*  HGB 10.5*  HCT 32.2*  PLT 290    Coag's No results for input(s): APTT, INR in the last 168 hours.  Sepsis Markers No results for input(s): LATICACIDVEN, PROCALCITON, O2SATVEN in the last 168 hours.  ABG  Recent Labs Lab 11/08/16 1600  PHART 7.385  PCO2ART 43.2  PO2ART 77.9*    Liver Enzymes No results for input(s): AST, ALT, ALKPHOS, BILITOT, ALBUMIN in the last 168 hours.  Cardiac Enzymes  Recent Labs Lab 11/08/16 1208 11/08/16 1628 11/08/16 2247  TROPONINI 0.05* 0.04* 0.05*    Glucose  Recent Labs Lab 11/09/16 0036  GLUCAP 119*    Imaging Dg Chest 2 View  Result Date: 11/08/2016 CLINICAL DATA:  80 year old female with chest pain and shortness of breath since yesterday. Initial encounter. Personal history of colon cancer. EXAM: CHEST  2 VIEW COMPARISON:  CT Abdomen and Pelvis 05/22/2016. Chest radiographs 05/22/2016 and earlier. FINDINGS: Upright AP and lateral views of the chest. New small bilateral pleural effusions. Stable mild cardiomegaly. Other mediastinal contours are within normal limits. Visualized tracheal air column is within normal limits. No pneumothorax.  No consolidation. Chronic diffuse increased pulmonary interstitium appears not significantly changed. No acute edema suspected. Osteopenia. Calcified aortic atherosclerosis. No acute osseous abnormality identified. Negative visible bowel gas pattern. IMPRESSION: New small bilateral pleural effusions. No other acute cardiopulmonary abnormality. Electronically Signed   By: Genevie Ann M.D.   On: 11/08/2016 10:57    STUDIES:  Beside echo by cards at [facility]: EF ~30%  CULTURES: None  ANTIBIOTICS: None  SIGNIFICANT EVENTS: Transfer to ICU  12/29 Transfer to [facility] ICU 12/29  LINES/TUBES: PIV Foley 12/29 >>  DISCUSSION: Ms. Hinck is a 22F w/ PMH significant for COPD, HTN, HLD, GERD, hypothyroidism who presents as a transfer from [facility] for ventricular ectopy, increased work of breathing and finding suggestive of a newly identified cardiomyopathy. Troponin has stayed ~ 0.05. CXR notable for small bilateral pleural effusions. Exam does demonstrate expiratory wheeze. Her respiratory symptoms are likely a combination of underlying COPD and new onset heart failure. Will check BNP, obtain formal TTE, continue steroids and nebs for now and await further cardiology input.   ASSESSMENT / PLAN:  PULMONARY A: Respiratory distress - likely secondary to CHF exacerbation +/- COPD P:   Continue supplemental O2 as needed to maintain sats > 88% Continue nebs Continue steroids BiPAP PRN for respiratory distress  CARDIOVASCULAR A:  Ventricular ectopy HTN ? Cardiomyopathy (TTE in 05/2015 w/ EF 99991111, diastolic dysfunction) P:  Formal TTE monitor BP; prns for SBP > 180 Check BNP  RENAL A:   CKD III - Cr near baseline ~ 1.8 P:   Monitor Avoid nephrotoxins  GASTROINTESTINAL A:   Recent rectal adenoca s/p 5FU then Xeloda, currently off treatment P:   Bowel regimen  HEMATOLOGIC A:   No acute issues Chronic anemia - stable P:   INFECTIOUS A:   No acute issues P:     ENDOCRINE A:   Hypothyroid   P:   Continue synthroid  NEUROLOGIC A:   No acute issues P:   RASS goal: 0   FAMILY  - Updates: patient and son at bedside. Patient desires to remain FULL CODE.   - Inter-disciplinary family meet or Palliative Care meeting due by:  day 7  The patient is critically ill with multiple organ system failure and requires high complexity decision making for assessment and support, frequent evaluation and titration of therapies, advanced monitoring, review of radiographic studies and interpretation of complex data.   Critical  Care Time devoted to patient care services, exclusive of separately billable procedures, described in this note is 41 minutes.   Yisroel Ramming, MD Pulmonary and Critical Care Medicine [facility] Pager: [phone]  11/09/2016, 12:58 AM

## 2016-11-10 DIAGNOSIS — I5021 Acute systolic (congestive) heart failure: Secondary | ICD-10-CM

## 2016-11-10 DIAGNOSIS — J9601 Acute respiratory failure with hypoxia: Secondary | ICD-10-CM

## 2016-11-10 DIAGNOSIS — J441 Chronic obstructive pulmonary disease with (acute) exacerbation: Secondary | ICD-10-CM

## 2016-11-10 LAB — BASIC METABOLIC PANEL
ANION GAP: 12 (ref 5–15)
BUN: 32 mg/dL — ABNORMAL HIGH (ref 6–20)
CALCIUM: 7.6 mg/dL — AB (ref 8.9–10.3)
CO2: 34 mmol/L — AB (ref 22–32)
Chloride: 99 mmol/L — ABNORMAL LOW (ref 101–111)
Creatinine, Ser: 1.98 mg/dL — ABNORMAL HIGH (ref 0.44–1.00)
GFR calc non Af Amer: 23 mL/min — ABNORMAL LOW (ref >60)
GFR, EST AFRICAN AMERICAN: 27 mL/min — AB (ref >60)
GLUCOSE: 136 mg/dL — AB (ref 65–99)
POTASSIUM: 3 mmol/L — AB (ref 3.5–5.1)
Sodium: 145 mmol/L (ref 135–145)

## 2016-11-10 LAB — CBC
HEMATOCRIT: 28.6 % — AB (ref 36.0–46.0)
Hemoglobin: 9.4 g/dL — ABNORMAL LOW (ref 12.0–15.0)
MCH: 32.9 pg (ref 26.0–34.0)
MCHC: 32.9 g/dL (ref 30.0–36.0)
MCV: 100 fL (ref 78.0–100.0)
Platelets: 235 10*3/uL (ref 150–400)
RBC: 2.86 MIL/uL — AB (ref 3.87–5.11)
RDW: 15.1 % (ref 11.5–15.5)
WBC: 6.6 10*3/uL (ref 4.0–10.5)

## 2016-11-10 LAB — GLUCOSE, CAPILLARY
Glucose-Capillary: 121 mg/dL — ABNORMAL HIGH (ref 65–99)
Glucose-Capillary: 142 mg/dL — ABNORMAL HIGH (ref 65–99)

## 2016-11-10 LAB — MAGNESIUM: Magnesium: 1.5 mg/dL — ABNORMAL LOW (ref 1.7–2.4)

## 2016-11-10 LAB — PHOSPHORUS: PHOSPHORUS: 5 mg/dL — AB (ref 2.5–4.6)

## 2016-11-10 MED ORDER — MEXILETINE HCL 200 MG PO CAPS
200.0000 mg | ORAL_CAPSULE | Freq: Two times a day (BID) | ORAL | Status: DC
  Administered 2016-11-10 – 2016-11-20 (×21): 200 mg via ORAL
  Filled 2016-11-10 (×22): qty 1

## 2016-11-10 MED ORDER — POTASSIUM CHLORIDE 20 MEQ/15ML (10%) PO SOLN
40.0000 meq | Freq: Once | ORAL | Status: AC
Start: 2016-11-10 — End: 2016-11-10
  Administered 2016-11-10: 40 meq
  Filled 2016-11-10: qty 30

## 2016-11-10 MED ORDER — MAGNESIUM SULFATE 4 GM/100ML IV SOLN
4.0000 g | Freq: Once | INTRAVENOUS | Status: AC
  Administered 2016-11-10: 4 g via INTRAVENOUS
  Filled 2016-11-10: qty 100

## 2016-11-10 NOTE — Progress Notes (Signed)
[place] Progress Note Patient Name: Meghan Anderson DOB: 16-Aug-1936 MRN: [id number]   Date of Service  11/10/2016  HPI/Events of Note  Request to transfer patient to telemetry. Dr. Agustina Caroli not stated that she could go to telemetry if she tolerated her medication transition which she has.   eICU Interventions  Will order transfer to [place].      Intervention Category Minor Interventions: Routine modifications to care plan (e.g. PRN medications for pain, fever)  M[facility] 11/10/2016, 7:56 PM

## 2016-11-10 NOTE — Progress Notes (Signed)
[place] Progress Note Patient Name: Meghan Anderson DOB: [birth year] MRN: [id number]   Date of Service  11/10/2016  HPI/Events of Note  Hypokalemia and hypomag  eICU Interventions  Potassium and mag replaced     Intervention Category Intermediate Interventions: Electrolyte abnormality - evaluation and management  Greig Altergott 11/10/2016, 4:05 AM

## 2016-11-10 NOTE — Progress Notes (Signed)
PULMONARY / CRITICAL CARE MEDICINE   Name: [facility] MRN: [id number] DOB: [birth year]    ADMISSION DATE:  11/08/2016 CONSULTATION DATE:    Meghan Chance MD:  [facility]  CHIEF COMPLAINT:  Shortness of breath  HISTORY OF PRESENT ILLNESS:   Meg[facility] Anderson is a 55F with PMH significant for COPD, CKD III, GERD, HLD, HTN, prior rectal adenoca and hypothyroidism. She was admitted to [facility] on 12/29 with complaints of worsening shortness of breath x 1 day. She was dyspneic both with exertion and at rest. She denied associated cough, sputum, fever, chills, chest pain, palpitations. On presentation she was noted to be hypoxemic. She had a mild troponin leak at 0.05.  Within a few hours, she had increasing shortness of breath and wheezing, persistent hypertension and telemetry reported arrhythmias (bigeminy, trigeminy, brief runs of V tach). She was transferred to ICU. She was seen by cardiology with bedside echo showing EF ~30%. She was started on lidocaine, given iv lasix and transfer to [facility] was recommended. She was also started on BiPAP for work of breathing.   Currently she is resting comfortably and saturating 96% on 1L O2. She is able to speak in complete sentences and says that her breathing feels much better. She has been short of breath for several days (since 12/26), but had an acute worsening the morning of 12/29. She had gone to her pulmonologist several days ago to get clearance for back surgery and that physician felt that she was acutely exacerbated and gave her a kenalog shot, prednisone and an antibiotic shot. Despite those interventions, she failed to improve. She is usually maintained on Breo, Spiriva and prn nebs. She has never had heart trouble that she can recall. She has some discomfort in her abdomen and back pain that radiates to her left leg, but otherwise has no complaints. She denies fever / chills / sputum / hemoptysis / palpitations / numbness / tingling / nausea /  vomiting / diarrhea / urinary issues.  SUBJECTIVE:  Has not required further BiPAP  VITAL SIGNS: BP 110/68   Pulse 74   Temp 98 F (36.7 C) (Oral)   Resp 15   Ht 5\' 1"  (1.549 m)   Wt 60.5 kg (133 lb 6.1 oz)   SpO2 96%   BMI 25.20 kg/m   HEMODYNAMICS:   VENTILATOR SETTINGS: FiO2 (%):  [96 %] 96 % INTAKE / OUTPUT: I/O last 3 completed shifts: In: [id number] [P.O.:660; I.V.:595; IV Piggyback:100] Out: [id number] [Urine:3165; Emesis/NG output:800]  PHYSICAL EXAMINATION:  General Well nourished, well developed, no apparent distress, up to chair  HEENT No gross abnormalities. Oropharynx clear. Mallampati III, good dentition.  Pulmonary Coarse breath sounds bilaterally with prolonged expiratory phase and expiratory wheezes throughout.   Cardiovascular Normal rate, regular rhythm. S1, s2. No m/r/g. Distal pulses palpable.  Abdomen Soft, mild diffuse tenderness to palpation, no guarding or rebound, non-distended, positive bowel sounds, no palpable organomegaly or masses. Normoresonant to percussion.  Musculoskeletal Grossly normal  Lymphatics No cervical, supraclavicular or axillary adenopathy.   Neurologic Grossly intact. No focal deficits.   Skin/Integuement No rash, no cyanosis, no clubbing. No edema.     LABS:  BMET  Recent Labs Lab 11/08/16 0857 11/10/16 0241  NA 144 145  K 3.5 3.0*  CL 107 99*  CO2 29 34*  BUN 28* 32*  CREATININE 1.94* 1.98*  GLUCOSE 112* 136*    Electrolytes  Recent Labs Lab 11/08/16 0857 11/09/16 0237 11/10/16 0241  CALCIUM 7.8*  --  7.6*  MG  --  1.5* 1.5*  PHOS  --   --  5.0*    CBC  Recent Labs Lab 11/08/16 0857 11/10/16 0241  WBC 10.7* 6.6  HGB 10.5* 9.4*  HCT 32.2* 28.6*  PLT 290 235    Coag's No results for input(s): APTT, INR in the last 168 hours.  Sepsis Markers No results for input(s): LATICACIDVEN, PROCALCITON, O2SATVEN in the last 168 hours.  ABG  Recent Labs Lab 11/08/16 1600  PHART 7.385  PCO2ART 43.2   PO2ART 77.9*    Liver Enzymes No results for input(s): AST, ALT, ALKPHOS, BILITOT, ALBUMIN in the last 168 hours.  Cardiac Enzymes  Recent Labs Lab 11/08/16 1208 11/08/16 1628 11/08/16 2247  TROPONINI 0.05* 0.04* 0.05*    Glucose  Recent Labs Lab 11/09/16 0036 11/09/16 0847 11/09/16 1200 11/10/16 0740  GLUCAP 119* 120* 129* 121*    Imaging Dg Abd Portable 1v  Result Date: 11/09/2016 CLINICAL DATA:  Impaired nasogastric feeding tube EXAM: PORTABLE ABDOMEN - 1 VIEW COMPARISON:  CT from 05/22/2016, lumbar spine CT 10/14/2016 FINDINGS: The tip and side port of a nasogastric tube is seen in the left upper quadrant in the expected location of the stomach. Scattered gas containing small large bowel loops in a nonobstructive pattern. Levoscoliosis with lumbar spondylosis of the dorsal spine. Urinary probe projects over the mid pelvis. Mild sclerosis about the sacroiliac joints bilaterally. Patient has sacral insufficiency fractures by CT. IMPRESSION: Gastric tube in the expected location the stomach. No bowel obstruction or free air. Levoconvex curvature of the lumbar spine with spondylosis. Electronically Signed   By: [facility] M.D.   On: 11/09/2016 23:28    STUDIES:  Beside echo by cards at [facility]: EF ~30%  CULTURES: None  ANTIBIOTICS: None  SIGNIFICANT EVENTS: Transfer to ICU 12/29 Transfer to [facility] ICU 12/29  LINES/TUBES: PIV Foley 12/29 >>  DISCUSSION: [facility] is a 73F w/ PMH significant for COPD, HTN, HLD, GERD, hypothyroidism who presents as a transfer from [facility] for ventricular ectopy, increased work of breathing and finding suggestive of a newly identified cardiomyopathy. Troponin has stayed ~ 0.05. CXR notable for small bilateral pleural effusions. Exam does demonstrate expiratory wheeze. Her respiratory symptoms are likely a combination of underlying COPD and new onset heart failure. Will check BNP, obtain formal TTE, continue steroids and nebs for now and await  further cardiology input.   ASSESSMENT / PLAN:  PULMONARY A: Respiratory distress - likely secondary to CHF exacerbation + COPD, improved although still w wheeze P:   Continue supplemental O2 as needed to maintain sats > 88% Continue nebs Continue scheduled steroids Will d/c biPAP and follow  CARDIOVASCULAR A:  Ventricular ectopy HTN ? Cardiomyopathy (TTE in 05/2015 w/ EF 99991111, diastolic dysfunction) P:  Formal TTE read pending monitor BP; prns for SBP > 180 Discussed with Cardiology, appreciate assistance. Plan to d/c lidocaine today, transition to mexiletine 200mg  q12h.   RENAL A:   CKD III - Cr near baseline ~ 1.8 P:   Monitor Avoid nephrotoxins  GASTROINTESTINAL A:   Recent rectal adenoca s/p 5FU then Xeloda, currently off treatment P:   Bowel regimen  HEMATOLOGIC A:   No acute issues Chronic anemia - stable P:   INFECTIOUS A:   No acute issues P:     ENDOCRINE A:   Hypothyroid   P:   Continue synthroid  NEUROLOGIC A:   No acute issues P:   RASS goal: 0  Will pan for  her to go to telemetry 12/31 pm as long as she tolerates the transition to mexiletine well.   FAMILY  - Updates: discussed status with son and daughter at bedside 12/30, grandson 12/31  - Inter-disciplinary family meet or Palliative Care meeting due by:  day 7  The patient is critically ill with multiple organ system failure and requires high complexity decision making for assessment and support, frequent evaluation and titration of therapies, advanced monitoring, review of radiographic studies and interpretation of complex data.   Critical Care Time devoted to patient care services, exclusive of separately billable procedures, described in this note is 32 minutes.   Baltazar Apo, MD, PhD 11/10/2016, 10:23 AM [place] Pulmonary and Critical Care [phone] or if no answer [phone]

## 2016-11-10 NOTE — Progress Notes (Signed)
Patient Name: Meghan Anderson Date of Encounter: 11/10/2016  Primary Cardiologist: new  Hospital Problem List     Principal Problem:   Acute respiratory failure with hypoxia [facility]) Active Problems:   Essential hypertension, benign   Hyperlipidemia   Hypothyroidism   Chronic renal disease, stage 3, moderately decreased glomerular filtration rate (GFR) between 30-59 mL/min/1.73 square meter   COPD with acute exacerbation (HCC)   Troponin level elevated     Subjective   Feeling much improved currently without chest pain.  Continued SOB. Has had no further ventricular ectopy since being on lidocaine.  Inpatient Medications    Scheduled Meds: . amLODipine  10 mg Oral Daily  . atorvastatin  40 mg Oral q1800  . ipratropium  0.5 mg Nebulization QID  . levalbuterol  0.63 mg Nebulization QID  . levothyroxine  88 mcg Oral QAC breakfast  . methylPREDNISolone (SOLU-MEDROL) injection  40 mg Intravenous Q12H  . mexiletine  200 mg Oral Q12H  . pantoprazole  40 mg Oral Daily  . potassium chloride SA  10 mEq Oral Daily   Continuous Infusions:  PRN Meds: acetaminophen **OR** acetaminophen, hydrALAZINE, ipratropium-albuterol, ondansetron **OR** ondansetron (ZOFRAN) IV, temazepam   Vital Signs    Vitals:   11/10/16 0500 11/10/16 0600 11/10/16 0700 11/10/16 0953  BP: 121/76 110/68    Pulse: 80 74    Resp: 14 15    Temp:   98 F (36.7 C)   TempSrc:   Oral   SpO2: 98% 98%  96%  Weight:      Height:        Intake/Output Summary (Last 24 hours) at 11/10/16 1047 Last data filed at 11/10/16 0600  Gross per 24 hour  Intake              770 ml  Output             1615 ml  Net             -845 ml   Filed Weights   11/09/16 0100 11/09/16 0400 11/10/16 0428  Weight: 134 lb 0.6 oz (60.8 kg) 133 lb 2.5 oz (60.4 kg) 133 lb 6.1 oz (60.5 kg)    Physical Exam    GEN: Well nourished, well developed, in no acute distress.  HEENT: Grossly normal.  Neck: Supple, no JVD, carotid  bruits, or masses. Cardiac: RRR, no murmurs, rubs, or gallops. No clubbing, cyanosis, edema.  Radials/DP/PT 2+ and equal bilaterally.  Respiratory:  Respirations regular and unlabored, significant wheezing throughout. GI: Soft, nontender, nondistended, BS + x 4. MS: no deformity or atrophy. Skin: warm and dry, no rash. Neuro:  Strength and sensation are intact. Psych: AAOx3.  Normal affect.  Labs    CBC  Recent Labs  11/08/16 0857 11/10/16 0241  WBC 10.7* 6.6  NEUTROABS 9.3*  --   HGB 10.5* 9.4*  HCT 32.2* 28.6*  MCV 105.6* 100.0  PLT 290 [id number]   Basic Metabolic Panel  Recent Labs  11/08/16 0857 11/09/16 0237 11/10/16 0241  NA 144  --  145  K 3.5  --  3.0*  CL 107  --  99*  CO2 29  --  34*  GLUCOSE 112*  --  136*  BUN 28*  --  32*  CREATININE 1.94*  --  1.98*  CALCIUM 7.8*  --  7.6*  MG  --  1.5* 1.5*  PHOS  --   --  5.0*   Liver Function Tests No results for  input(s): AST, ALT, ALKPHOS, BILITOT, PROT, ALBUMIN in the last 72 hours. No results for input(s): LIPASE, AMYLASE in the last 72 hours. Cardiac Enzymes  Recent Labs  11/08/16 1208 11/08/16 1628 11/08/16 2247  TROPONINI 0.05* 0.04* 0.05*   BNP Invalid input(s): POCBNP D-Dimer No results for input(s): DDIMER in the last 72 hours. Hemoglobin A1C No results for input(s): HGBA1C in the last 72 hours. Fasting Lipid Panel No results for input(s): CHOL, HDL, LDLCALC, TRIG, CHOLHDL, LDLDIRECT in the last 72 hours. Thyroid Function Tests  Recent Labs  11/09/16 0237  TSH 103.897*    Telemetry    Sinus rhythm  - Personally Reviewed  ECG    Sinus rhythm with PVCs - Personally Reviewed  Radiology    Dg Abd Portable 1v  Result Date: 11/09/2016 CLINICAL DATA:  Impaired nasogastric feeding tube EXAM: PORTABLE ABDOMEN - 1 VIEW COMPARISON:  CT from 05/22/2016, lumbar spine CT 10/14/2016 FINDINGS: The tip and side port of a nasogastric tube is seen in the left upper quadrant in the expected location  of the stomach. Scattered gas containing small large bowel loops in a nonobstructive pattern. Levoscoliosis with lumbar spondylosis of the dorsal spine. Urinary probe projects over the mid pelvis. Mild sclerosis about the sacroiliac joints bilaterally. Patient has sacral insufficiency fractures by CT. IMPRESSION: Gastric tube in the expected location the stomach. No bowel obstruction or free air. Levoconvex curvature of the lumbar spine with spondylosis. Electronically Signed   By: Ashley Royalty M.D.   On: 11/09/2016 23:28    Cardiac Studies   TTE pending  Patient Profile     Patient is 87, with history of advanced COPD and CKD, IIA (T3N0M0) rectal adenocarcinoma, S/P resection of rectal mass by Dr. Marcello Moores on 12/14/2015, now S/P concomitant chemoradiation, HLD recently transferred from [facility] after presenting there for chest pain and shortness of breath.   Assessment & Plan    1. NSVT: significantly less ventricular ectopy after starting lidocaine.  She is doing well on lidocaine.  TTE not performed yet.  Plan to switch to Mexitil today and stop lidocaine.  If she remains without ventricular ectopy, would be able to move to the floor.  2. Chest pain: has elevated troponin to 0.05. Dajai Wahlert need ischemic workup, myoview vs cath pending TTE  3. COPD: continued wheezing, per pulmonary  Signed, Waylin Dorko Meredith Leeds, MD  11/10/2016, 10:47 AM

## 2016-11-11 ENCOUNTER — Inpatient Hospital Stay (HOSPITAL_COMMUNITY): Payer: Medicare Other

## 2016-11-11 LAB — BASIC METABOLIC PANEL
Anion gap: 11 (ref 5–15)
BUN: 33 mg/dL — AB (ref 6–20)
CHLORIDE: 100 mmol/L — AB (ref 101–111)
CO2: 32 mmol/L (ref 22–32)
Calcium: 7.7 mg/dL — ABNORMAL LOW (ref 8.9–10.3)
Creatinine, Ser: 1.83 mg/dL — ABNORMAL HIGH (ref 0.44–1.00)
GFR calc Af Amer: 29 mL/min — ABNORMAL LOW (ref >60)
GFR calc non Af Amer: 25 mL/min — ABNORMAL LOW (ref >60)
GLUCOSE: 90 mg/dL (ref 65–99)
POTASSIUM: 3.6 mmol/L (ref 3.5–5.1)
SODIUM: 143 mmol/L (ref 135–145)

## 2016-11-11 LAB — ECHOCARDIOGRAM COMPLETE
Height: 61 in
WEIGHTICAEL: 2081.14 [oz_av]

## 2016-11-11 LAB — BRAIN NATRIURETIC PEPTIDE: B Natriuretic Peptide: 864.8 pg/mL — ABNORMAL HIGH (ref 0.0–100.0)

## 2016-11-11 LAB — GLUCOSE, CAPILLARY: Glucose-Capillary: 84 mg/dL (ref 65–99)

## 2016-11-11 MED ORDER — METHYLPREDNISOLONE SODIUM SUCC 40 MG IJ SOLR: 40.0000 mg | Freq: Every day | INTRAMUSCULAR | Status: DC

## 2016-11-11 MED ORDER — LEVALBUTEROL HCL 0.63 MG/3ML IN NEBU
0.6300 mg | INHALATION_SOLUTION | RESPIRATORY_TRACT | Status: DC | PRN
  Administered 2016-11-11 – 2016-11-16 (×4): 0.63 mg via RESPIRATORY_TRACT
  Filled 2016-11-11 (×5): qty 3

## 2016-11-11 MED ORDER — BISACODYL 10 MG RE SUPP: 10.0000 mg | Freq: Every day | RECTAL | Status: DC | PRN

## 2016-11-11 MED ORDER — POLYETHYLENE GLYCOL 3350 17 G PO PACK: 17.0000 g | PACK | Freq: Every day | ORAL | Status: DC | PRN

## 2016-11-11 NOTE — Progress Notes (Signed)
PULMONARY / CRITICAL CARE MEDICINE   Name: Meghan Anderson MRN: [id number] DOB: 14-Oct-1936    ADMISSION DATE:  11/08/2016 CONSULTATION DATE:    [name] MD:  [facility]  CHIEF COMPLAINT:  Shortness of breath  HISTORY OF PRESENT ILLNESS:   Meghan Anderson is a 89F with PMH significant for COPD, CKD III, GERD, HLD, HTN, prior rectal adenoca and hypothyroidism. She was admitted to [facility] on 12/29 with complaints of worsening shortness of breath x 1 day. She was dyspneic both with exertion and at rest. She denied associated cough, sputum, fever, chills, chest pain, palpitations. On presentation she was noted to be hypoxemic. She had a mild troponin leak at 0.05.  Within a few hours, she had increasing shortness of breath and wheezing, persistent hypertension and telemetry reported arrhythmias (bigeminy, trigeminy, brief runs of V tach). She was transferred to ICU. She was seen by cardiology with bedside echo showing EF ~30%. She was started on lidocaine, given iv lasix and transfer to [name] was recommended. She was also started on BiPAP for work of breathing.   Currently she is resting comfortably and saturating 96% on 1L O2. She is able to speak in complete sentences and says that her breathing feels much better. She has been short of breath for several days (since 12/26), but had an acute worsening the morning of 12/29. She had gone to her pulmonologist several days ago to get clearance for back surgery and that physician felt that she was acutely exacerbated and gave her a kenalog shot, prednisone and an antibiotic shot. Despite those interventions, she failed to improve. She is usually maintained on Breo, Spiriva and prn nebs. She has never had heart trouble that she can recall. She has some discomfort in her abdomen and back pain that radiates to her left leg, but otherwise has no complaints. She denies fever / chills / sputum / hemoptysis / palpitations / numbness / tingling / nausea /  vomiting / diarrhea / urinary issues.  SUBJECTIVE:  Has not required further BiPAP.  NSVT resolved, tolerating mexiletine well.  Feels that breathing is much improved today.  VITAL SIGNS: BP (!) 138/94 (BP Location: Left Arm)   Pulse 73   Temp 98.1 F (36.7 C) (Oral)   Resp (!) 22   Ht 5\' 1"  (1.549 m)   Wt 130 lb 1.1 oz (59 kg)   SpO2 96%   BMI 24.58 kg/m   HEMODYNAMICS:   VENTILATOR SETTINGS:   INTAKE / OUTPUT: I/O last 3 completed shifts: In: 325 [I.V.:225; IV Piggyback:100] Out: 1225 [Urine:425; Emesis/NG output:800]  PHYSICAL EXAMINATION:  General Well nourished, well developed, no apparent distress, up to chair  HEENT No gross abnormalities. Oropharynx clear. Mallampati III, good dentition.  Pulmonary Prolonged expiratory phase.  Expiratory wheeze bilateral bases.  Cardiovascular Normal rate, regular rhythm. S1, s2. No m/r/g. Distal pulses palpable.  Abdomen Soft, mild diffuse tenderness to palpation, no guarding or rebound, non-distended, positive bowel sounds, no palpable organomegaly or masses. Normoresonant to percussion.  Musculoskeletal Grossly normal  Lymphatics No cervical, supraclavicular or axillary adenopathy.   Neurologic Grossly intact. No focal deficits.   Skin/Integuement No rash, no cyanosis, no clubbing. No edema.     LABS:  BMET  Recent Labs Lab 11/08/16 0857 11/10/16 0241 11/11/16 0717  NA 144 145 143  K 3.5 3.0* 3.6  CL 107 99* 100*  CO2 29 34* 32  BUN 28* 32* 33*  CREATININE 1.94* 1.98* 1.83*  GLUCOSE 112* 136* 90  Electrolytes  Recent Labs Lab 11/08/16 0857 11/09/16 0237 11/10/16 0241 11/11/16 0717  CALCIUM 7.8*  --  7.6* 7.7*  MG  --  1.5* 1.5*  --   PHOS  --   --  5.0*  --     CBC  Recent Labs Lab 11/08/16 0857 11/10/16 0241  WBC 10.7* 6.6  HGB 10.5* 9.4*  HCT 32.2* 28.6*  PLT 290 235    Coag's No results for input(s): APTT, INR in the last 168 hours.  Sepsis Markers No results for input(s):  LATICACIDVEN, PROCALCITON, O2SATVEN in the last 168 hours.  ABG  Recent Labs Lab 11/08/16 1600  PHART 7.385  PCO2ART 43.2  PO2ART 77.9*    Liver Enzymes No results for input(s): AST, ALT, ALKPHOS, BILITOT, ALBUMIN in the last 168 hours.  Cardiac Enzymes  Recent Labs Lab 11/08/16 1208 11/08/16 1628 11/08/16 2247  TROPONINI 0.05* 0.04* 0.05*    Glucose  Recent Labs Lab 11/09/16 0036 11/09/16 0847 11/09/16 1200 11/10/16 0740 11/10/16 1628 11/11/16 0721  GLUCAP 119* 120* 129* 121* 142* 84    Imaging No results found.  STUDIES:  Beside echo by cards at [facility]: EF ~30%  CULTURES: None  ANTIBIOTICS: None  SIGNIFICANT EVENTS: Transfer to ICU 12/29 Transfer to [facility] ICU 12/29  LINES/TUBES: PIV Foley 12/29 >>  DISCUSSION: Meghan Anderson is a 57F w/ PMH significant for COPD, HTN, HLD, GERD, hypothyroidism who presents as a transfer from [facility] for ventricular ectopy, increased work of breathing and finding suggestive of a newly identified cardiomyopathy. Troponin has stayed ~ 0.05. CXR notable for small bilateral pleural effusions. Exam does demonstrate expiratory wheeze. Her respiratory symptoms are likely a combination of underlying COPD and new onset heart failure. Will check BNP, obtain formal TTE, continue steroids and nebs for now and await further cardiology input.   ASSESSMENT / PLAN:  PULMONARY A: Respiratory distress - likely secondary to CHF exacerbation + COPD, improved although still w slight wheeze.  Has done well and not needed BiPAP past few days. P:   Continue supplemental O2 as needed to maintain sats > 88% Continue nebs Continue scheduled steroids - drop to QD then transition to PO and start wean CXR intermittently  CARDIOVASCULAR A:  Ventricular ectopy Troponin leak HTN ? Cardiomyopathy (TTE in 05/2015 w/ EF [id number], diastolic dysfunction) P:  Formal TTE read pending monitor BP; prns for SBP > 180 Cardiology and EP following, appreciate  assistance Possible ischemic workup pending  RENAL A:   CKD III - Cr near baseline ~ 1.8 P:   Monitor Avoid nephrotoxins  GASTROINTESTINAL A:   Recent rectal adenoca s/p 5FU then Xeloda, currently off treatment P:   Bowel regimen  ENDOCRINE A:   Hypothyroid  - TSH now markedly elevated (103) P:   Continue synthroid Assess T4 May need synthroid dosing increased   Doing well, has not needed BiPAP.    Will ask [facility] to takeover her care starting AM 12/13/16 and PCCM off.   Montey Hora, [place] [facility] Pager: [phone]  or [phone] 11/11/2016, 9:36 AM  Attending Note:  I have examined patient, reviewed labs, studies and notes. I have discussed the case with Junius Roads, and I agree with the data and plans as amended above.  COPD exacerbation is being treated. I woul change her to PO pred but she has an allergy. Consider to Medrol soon. She has tolerated PO antiarrhythmic. Appreciate cardiology's help. Will  continue her current care.  She still needs TTE and cardiac workup. We will ask [facility] to assume her care. Call if we can assist.    Baltazar Apo, MD, PhD 11/11/2016, 4:15 PM [place] [facility] [phone] or if no answer [phone]

## 2016-11-11 NOTE — Progress Notes (Signed)
Patient Name: Meghan Anderson Date of Encounter: 11/11/2016  Primary Cardiologist: new  Hospital Problem List     Principal Problem:   Acute respiratory failure with hypoxia [facility]) Active Problems:   Essential hypertension, benign   Hyperlipidemia   Hypothyroidism   Chronic renal disease, stage 3, moderately decreased glomerular filtration rate (GFR) between 30-59 mL/min/1.73 square meter   COPD with acute exacerbation (HCC)   Troponin level elevated     Subjective   Feeling much improved currently without chest pain.  Continued SOB.   Inpatient Medications    Scheduled Meds: . amLODipine  10 mg Oral Daily  . atorvastatin  40 mg Oral q1800  . ipratropium  0.5 mg Nebulization QID  . levalbuterol  0.63 mg Nebulization QID  . levothyroxine  88 mcg Oral QAC breakfast  . methylPREDNISolone (SOLU-MEDROL) injection  40 mg Intravenous Q12H  . mexiletine  200 mg Oral Q12H  . pantoprazole  40 mg Oral Daily  . potassium chloride SA  10 mEq Oral Daily   Continuous Infusions:  PRN Meds: acetaminophen **OR** acetaminophen, hydrALAZINE, ipratropium-albuterol, ondansetron **OR** ondansetron (ZOFRAN) IV, temazepam   Vital Signs    Vitals:   11/10/16 2000 11/10/16 2058 11/10/16 2129 11/11/16 0446  BP: 127/80  134/79 120/64  Pulse: 74 73 81 70  Resp: 19 18 20 18   Temp:   99.1 F (37.3 C) 98 F (36.7 C)  TempSrc:      SpO2: 95% 98% 90% 90%  Weight:   130 lb 1.1 oz (59 kg)   Height:        Intake/Output Summary (Last 24 hours) at 11/11/16 0759 Last data filed at 11/11/16 0600  Gross per 24 hour  Intake                0 ml  Output                0 ml  Net                0 ml   Filed Weights   11/09/16 0400 11/10/16 0428 11/10/16 2129  Weight: 133 lb 2.5 oz (60.4 kg) 133 lb 6.1 oz (60.5 kg) 130 lb 1.1 oz (59 kg)    Physical Exam    GEN: Well nourished, well developed, in no acute distress.  HEENT: Grossly normal.  Neck: Supple, no JVD, carotid bruits, or  masses. Cardiac: RRR, no murmurs, rubs, or gallops. No clubbing, cyanosis, edema.  Radials/DP/PT 2+ and equal bilaterally.  Respiratory:  Respirations regular and unlabored, significant wheezing throughout. GI: Soft, nontender, nondistended, BS + x 4. MS: no deformity or atrophy. Skin: warm and dry, no rash. Neuro:  Strength and sensation are intact. Psych: AAOx3.  Normal affect.  Labs    CBC  Recent Labs  11/08/16 0857 11/10/16 0241  WBC 10.7* 6.6  NEUTROABS 9.3*  --   HGB 10.5* 9.4*  HCT 32.2* 28.6*  MCV 105.6* 100.0  PLT 290 [id number]   Basic Metabolic Panel  Recent Labs  11/08/16 0857 11/09/16 0237 11/10/16 0241  NA 144  --  145  K 3.5  --  3.0*  CL 107  --  99*  CO2 29  --  34*  GLUCOSE 112*  --  136*  BUN 28*  --  32*  CREATININE 1.94*  --  1.98*  CALCIUM 7.8*  --  7.6*  MG  --  1.5* 1.5*  PHOS  --   --  5.0*  Liver Function Tests No results for input(s): AST, ALT, ALKPHOS, BILITOT, PROT, ALBUMIN in the last 72 hours. No results for input(s): LIPASE, AMYLASE in the last 72 hours. Cardiac Enzymes  Recent Labs  11/08/16 1208 11/08/16 1628 11/08/16 2247  TROPONINI 0.05* 0.04* 0.05*   BNP Invalid input(s): POCBNP D-Dimer No results for input(s): DDIMER in the last 72 hours. Hemoglobin A1C No results for input(s): HGBA1C in the last 72 hours. Fasting Lipid Panel No results for input(s): CHOL, HDL, LDLCALC, TRIG, CHOLHDL, LDLDIRECT in the last 72 hours. Thyroid Function Tests  Recent Labs  11/09/16 0237  TSH 103.897*    Telemetry    Sinus rhythm  - Personally Reviewed  ECG    Sinus rhythm with PVCs - Personally Reviewed  Radiology    Dg Abd Portable 1v  Result Date: 11/09/2016 CLINICAL DATA:  Impaired nasogastric feeding tube EXAM: PORTABLE ABDOMEN - 1 VIEW COMPARISON:  CT from 05/22/2016, lumbar spine CT 10/14/2016 FINDINGS: The tip and side port of a nasogastric tube is seen in the left upper quadrant in the expected location of the  stomach. Scattered gas containing small large bowel loops in a nonobstructive pattern. Levoscoliosis with lumbar spondylosis of the dorsal spine. Urinary probe projects over the mid pelvis. Mild sclerosis about the sacroiliac joints bilaterally. Patient has sacral insufficiency fractures by CT. IMPRESSION: Gastric tube in the expected location the stomach. No bowel obstruction or free air. Levoconvex curvature of the lumbar spine with spondylosis. Electronically Signed   By: Ashley Royalty M.D.   On: 11/09/2016 23:28    Cardiac Studies   TTE pending  Patient Profile     Patient is 63, with history of advanced COPD and CKD, IIA (T3N0M0) rectal adenocarcinoma, S/P resection of rectal mass by Dr. Marcello Moores on 12/14/2015, now S/P concomitant chemoradiation, HLD recently transferred from [facility] after presenting there for chest pain and shortness of breath.   Assessment & Plan    1. NSVT: significantly less ventricular ectopy after starting lidocaine.  Switched to [place] yesterday but unfortunately not on telemetry today. Plan to put on tele to further monitor for ventricular ectopy.   2. Chest pain: has elevated troponin to 0.05. Will need ischemic workup, myoview vs cath. TTE still not performed.  3. COPD: continued wheezing, per pulmonary  Signed, Will Meredith Leeds, MD  11/11/2016, 7:59 AM

## 2016-11-11 NOTE — Progress Notes (Signed)
  Echocardiogram 2D Echocardiogram has been performed.  Meghan Anderson M 11/11/2016, 11:22 AM

## 2016-11-11 NOTE — Progress Notes (Signed)
OT Cancellation Note  Patient Details Name: Meghan Anderson MRN: [id number] DOB: 10-21-1936   Cancelled Treatment:    Reason Eval/Treat Not Completed: Patient at procedure or test/ unavailable. Pt currently getting ready to start ECHO test in room. Will check back later today for evaluation.  Almon Register [id number] 11/11/2016, 10:40 AM

## 2016-11-11 NOTE — Evaluation (Addendum)
Occupational Therapy Evaluation and Discharge Patient Details Name: Meghan Anderson MRN: [id number] DOB: 09/26/1936 Today's Date: 11/11/2016    History of Present Illness HPI: Meghan Anderson is a 81 y.o. female with medical history significant for hypothyroidism, GERD, COPD, chronic kidney disease stage III. Patient presented with worsening shortness of breath started for past 1 day prior to this admission. Patient reported feeling short of breath at rest and with exertion.    Clinical Impression   This 81 yo female admitted with above presents to acute OT with deficits below (see OT problem list) thus affecting her PLOF of Mod I to independent with basic ADLs (except in and out of tub) and doing some of her IADLs. She will benefit from follow up HHOT--acute OT will sign off. Educated pt and son on purse lipped breathing as well as using inspirometer and they verbalized understanding and pt returned demo with VCs for both.    Follow Up Recommendations  Home health OT;Supervision/Assistance - 24 hour    Equipment Recommendations  None recommended by OT       Precautions / Restrictions Precautions Precautions: Fall Restrictions Weight Bearing Restrictions: No      Mobility Bed Mobility Overal bed mobility: Needs Assistance Bed Mobility: Supine to Sit;Sit to Supine     Supine to sit: Supervision Sit to supine: Supervision      Transfers Overall transfer level: Needs assistance Equipment used: Rolling walker (2 wheeled) Transfers: Sit to/from Stand Sit to Stand: Min guard         General transfer comment: v/c's for hand placement, to push up from bed/chair. Pt ambulated 100 feet with RW with minguard A    Balance Overall balance assessment: Needs assistance Sitting-balance support: No upper extremity supported;Feet supported Sitting balance-Leahy Scale: Good     Standing balance support: Bilateral upper extremity supported Standing balance-Leahy Scale: Poor                               ADL Overall ADL's : Needs assistance/impaired Eating/Feeding: Independent;Sitting   Grooming: Min guard;Standing;Wash/dry hands   Upper Body Bathing: Set up;Supervision/ safety;Sitting   Lower Body Bathing: Min guard;Sit to/from stand   Upper Body Dressing : Supervision/safety;Set up;Sitting   Lower Body Dressing: Min guard;Sit to/from stand   Toilet Transfer: Min guard;Ambulation;Regular Toilet;Grab bars;RW   Toileting- Water quality scientist and Hygiene: Min guard;Sit to/from stand                         Pertinent Vitals/Pain Pain Assessment: No/denies pain     Hand Dominance Right   Extremity/Trunk Assessment Upper Extremity Assessment Upper Extremity Assessment: Overall WFL for tasks assessed           Communication Communication Communication: No difficulties   Cognition Arousal/Alertness: Awake/alert Behavior During Therapy: WFL for tasks assessed/performed Overall Cognitive Status: Within Functional Limits for tasks assessed                                Home Living Family/patient expects to be discharged to:: Private residence Living Arrangements: Alone Available Help at Discharge: Family;Available 24 hours/day Type of Home: House Home Access: Level entry     Home Layout: Two level Alternate Level Stairs-Number of Steps: 8 Alternate Level Stairs-Rails: Right Bathroom Shower/Tub: Tub/shower unit;Curtain Shower/tub characteristics: Architectural technologist: Standard     Home Equipment:  Walker - 2 wheels;Shower seat;Hand held Veterinary surgeon - single point          Prior Functioning/Environment Level of Independence: Independent with assistive device(s)    ADL's / Homemaking Assistance Needed: family A pt in and out of tub, but pt does her own bathing and dressign as well as some of her IADLs   Comments: occasionally uses a rolling walker         OT Problem List: Impaired balance  (sitting and/or standing)   OT Treatment/Interventions:      OT Goals(Current goals can be found in the care plan section) Acute Rehab OT Goals Patient Stated Goal: home and hopeful for back sx soon  OT Frequency:                End of Session Equipment Utilized During Treatment: Gait belt  Activity Tolerance: Other (comment) (sats did drop to 86 with ambulation out of room and doffing/donning socks) Patient left: in bed;with call bell/phone within reach;with bed alarm set;with family/visitor present   Time: [id number] OT Time Calculation (min): 36 min Charges:  OT General Charges $OT Visit: 1 Procedure OT Evaluation $OT Eval Moderate Complexity: 1 Procedure OT Treatments $Self Care/Home Management : 8-22 mins  Almon Register [id number] 11/11/2016, 3:15 PM

## 2016-11-12 ENCOUNTER — Inpatient Hospital Stay (HOSPITAL_COMMUNITY): Payer: Medicare Other

## 2016-11-12 DIAGNOSIS — I429 Cardiomyopathy, unspecified: Secondary | ICD-10-CM

## 2016-11-12 DIAGNOSIS — I428 Other cardiomyopathies: Secondary | ICD-10-CM

## 2016-11-12 DIAGNOSIS — R0602 Shortness of breath: Secondary | ICD-10-CM

## 2016-11-12 DIAGNOSIS — I10 Essential (primary) hypertension: Secondary | ICD-10-CM

## 2016-11-12 DIAGNOSIS — E7800 Pure hypercholesterolemia, unspecified: Secondary | ICD-10-CM

## 2016-11-12 DIAGNOSIS — R748 Abnormal levels of other serum enzymes: Secondary | ICD-10-CM

## 2016-11-12 DIAGNOSIS — R262 Difficulty in walking, not elsewhere classified: Secondary | ICD-10-CM

## 2016-11-12 LAB — MAGNESIUM: Magnesium: 2 mg/dL (ref 1.7–2.4)

## 2016-11-12 LAB — BLOOD GAS, ARTERIAL
Acid-Base Excess: 11.2 mmol/L — ABNORMAL HIGH (ref 0.0–2.0)
Bicarbonate: 35.4 mmol/L — ABNORMAL HIGH (ref 20.0–28.0)
DRAWN BY: 244801
O2 Content: 2 L/min
O2 Saturation: 90.4 %
PATIENT TEMPERATURE: 98.6
PH ART: 7.475 — AB (ref 7.350–7.450)
pCO2 arterial: 48.8 mmHg — ABNORMAL HIGH (ref 32.0–48.0)
pO2, Arterial: 64.6 mmHg — ABNORMAL LOW (ref 83.0–108.0)

## 2016-11-12 LAB — CBC
HEMATOCRIT: 29.8 % — AB (ref 36.0–46.0)
Hemoglobin: 9.6 g/dL — ABNORMAL LOW (ref 12.0–15.0)
MCH: 33.9 pg (ref 26.0–34.0)
MCHC: 32.2 g/dL (ref 30.0–36.0)
MCV: 105.3 fL — AB (ref 78.0–100.0)
Platelets: 205 10*3/uL (ref 150–400)
RBC: 2.83 MIL/uL — AB (ref 3.87–5.11)
RDW: 15.4 % (ref 11.5–15.5)
WBC: 5.2 10*3/uL (ref 4.0–10.5)

## 2016-11-12 LAB — TROPONIN I: TROPONIN I: 0.08 ng/mL — AB (ref <0.03)

## 2016-11-12 LAB — PROCALCITONIN: PROCALCITONIN: 0.12 ng/mL

## 2016-11-12 LAB — GLUCOSE, CAPILLARY: GLUCOSE-CAPILLARY: 105 mg/dL — AB (ref 65–99)

## 2016-11-12 LAB — T4, FREE: FREE T4: 0.23 ng/dL — AB (ref 0.61–1.12)

## 2016-11-12 LAB — PHOSPHORUS: Phosphorus: 3 mg/dL (ref 2.5–4.6)

## 2016-11-12 MED ORDER — HEPARIN SODIUM (PORCINE) 5000 UNIT/ML IJ SOLN
5000.0000 [IU] | Freq: Three times a day (TID) | INTRAMUSCULAR | Status: DC
  Administered 2016-11-12 – 2016-11-18 (×19): 5000 [IU] via SUBCUTANEOUS
  Filled 2016-11-12 (×19): qty 1

## 2016-11-12 MED ORDER — PREDNISONE 20 MG PO TABS
40.0000 mg | ORAL_TABLET | Freq: Every day | ORAL | Status: DC
  Administered 2016-11-12 – 2016-11-19 (×9): 40 mg via ORAL
  Filled 2016-11-12 (×8): qty 2

## 2016-11-12 MED ORDER — LEVOTHYROXINE SODIUM 25 MCG PO TABS
125.0000 ug | ORAL_TABLET | Freq: Every day | ORAL | Status: DC
  Administered 2016-11-14 – 2016-11-20 (×7): 125 ug via ORAL
  Filled 2016-11-12 (×7): qty 1

## 2016-11-12 MED ORDER — GUAIFENESIN-DM 100-10 MG/5ML PO SYRP: 5.0000 mL | ORAL_SOLUTION | ORAL | Status: DC | PRN

## 2016-11-12 MED ORDER — FUROSEMIDE 10 MG/ML IJ SOLN
40.0000 mg | Freq: Once | INTRAMUSCULAR | Status: AC
  Administered 2016-11-12: 40 mg via INTRAVENOUS

## 2016-11-12 MED ORDER — ISOSORBIDE MONONITRATE ER 30 MG PO TB24
30.0000 mg | ORAL_TABLET | Freq: Every day | ORAL | Status: DC
  Administered 2016-11-12 – 2016-11-15 (×4): 30 mg via ORAL
  Filled 2016-11-12 (×4): qty 1

## 2016-11-12 MED ORDER — LEVALBUTEROL HCL 0.63 MG/3ML IN NEBU
0.6300 mg | INHALATION_SOLUTION | Freq: Four times a day (QID) | RESPIRATORY_TRACT | Status: DC
  Administered 2016-11-12 – 2016-11-14 (×8): 0.63 mg via RESPIRATORY_TRACT
  Filled 2016-11-12 (×8): qty 3

## 2016-11-12 MED ORDER — FUROSEMIDE 10 MG/ML IJ SOLN
INTRAMUSCULAR | Status: AC
  Filled 2016-11-12: qty 4

## 2016-11-12 MED ORDER — FUROSEMIDE 10 MG/ML IJ SOLN
40.0000 mg | Freq: Three times a day (TID) | INTRAMUSCULAR | Status: DC
  Administered 2016-11-12 – 2016-11-13 (×2): 40 mg via INTRAVENOUS
  Filled 2016-11-12 (×2): qty 4

## 2016-11-12 MED ORDER — MAGNESIUM OXIDE 400 (241.3 MG) MG PO TABS
400.0000 mg | ORAL_TABLET | Freq: Two times a day (BID) | ORAL | Status: DC
  Administered 2016-11-12 – 2016-11-20 (×17): 400 mg via ORAL
  Filled 2016-11-12 (×17): qty 1

## 2016-11-12 MED ORDER — IPRATROPIUM BROMIDE 0.02 % IN SOLN
0.5000 mg | Freq: Four times a day (QID) | RESPIRATORY_TRACT | Status: DC
  Administered 2016-11-12 – 2016-11-14 (×6): 0.5 mg via RESPIRATORY_TRACT
  Filled 2016-11-12 (×6): qty 2.5

## 2016-11-12 MED ORDER — VANCOMYCIN HCL IN DEXTROSE 1-5 GM/200ML-% IV SOLN
1000.0000 mg | INTRAVENOUS | Status: DC
  Administered 2016-11-12 – 2016-11-14 (×2): 1000 mg via INTRAVENOUS
  Filled 2016-11-12 (×2): qty 200

## 2016-11-12 MED ORDER — DEXTROSE 5 % IV SOLN
1.0000 g | INTRAVENOUS | Status: AC
  Administered 2016-11-12 – 2016-11-19 (×8): 1 g via INTRAVENOUS
  Filled 2016-11-12 (×8): qty 1

## 2016-11-12 NOTE — Care Management Important Message (Signed)
Important Message  Patient Details  Name: Meghan Anderson MRN: [id number] Date of Birth: [birth year]   Medicare Important Message Given:  Yes    Orbie Pyo 11/12/2016, 4:39 PM

## 2016-11-12 NOTE — Progress Notes (Addendum)
Patient's family came out of the room and notified that she is talking weird like today is her end and her father going to come and pick; went to check patient, seemed very lethargic and unable to arouse quickly as she was earlier.  Checked the vital signs, BP 166/88, PO2 95, P 90, notified the Dr. Allyson Sabal, given new orders for ABG stat and other labs.  Appreciated the RRN help.  Cardiology Dr. Claiborne Billings talked to the family and wanted her to be transferred to 3W due to her LV dysfunction.

## 2016-11-12 NOTE — Progress Notes (Signed)
Patients family informed that patient is having difficulty sitting up or lying in the bed, checked her she was taking labored breaths and could hear upper respiratory wheezing, VS 173/93, PO2 92, T 98.2, P 98.  Gave neb treatment and notified Dr. Allyson Sabal, she gave orders for Lasix and chest x-ray.

## 2016-11-12 NOTE — Progress Notes (Signed)
Pharmacy Antibiotic Note  Meghan Anderson is a 81 y.o. female admitted on 11/08/2016 with sepsis??.  Pharmacy has been consulted for Cefepime / Vancomycin dosing.  Plan: Cefepime 1 gram iv Q 24 hours Vancomycin 1 gram iv Q 48 hours Follow up progress, Scr, cultures   Height: 5\' 1"  (154.9 cm) Weight: 136 lb 0.4 oz (61.7 kg) IBW/kg (Calculated) : 47.8  Temp (24hrs), Avg:98 F (36.7 C), Min:97.6 F (36.4 C), Max:98.5 F (36.9 C)   Recent Labs Lab 11/08/16 0857 11/10/16 0241 11/11/16 0717 11/12/16 0751  WBC 10.7* 6.6  --  5.2  CREATININE 1.94* 1.98* 1.83*  --     Estimated Creatinine Clearance: 21.4 mL/min (by C-G formula based on SCr of 1.83 mg/dL (H)).    Allergies  Allergen Reactions  . Levofloxacin Other (See Comments)    Pt states that this medication was too strong and she could not take them.    . Moxifloxacin Rash  . Prednisone Itching and Rash  . Quinolones Rash     Thank you for allowing pharmacy to be a part of this patient's care. Anette Guarneri, PharmD [phone] 11/12/2016 1:49 PM

## 2016-11-12 NOTE — Significant Event (Signed)
Rapid Response Event Note  Overview: Time Called: [id number] Arrival Time: [id number] Event Type: Neurologic  Initial Focused Assessment: Patient admitted with CP and SOB.  Per family she was doing very well yesterday.  Today she has gotten progressively more sleepy.  She is easily arousable but falls asleep quickly.  BP 149/84  SR 80  RR 18 O2 sat 94% on 2l [place].  Lung sounds wheezy.  Heart tones regular 12 lead done about 1030 without acute changes  Interventions: Labs drawn ABG done  7.4/48/64/35 [place] [facility] PA at bedside to assess patient Plan Transfer patient to Cardiac Tele for further care   Plan of Care (if not transferred):  Event Summary: Name of Physician Notified: Dr Allyson Sabal notified prior to my arrival at    Name of Consulting Physician Notified: Dr Claiborne Billings at    Outcome: Transferred (Comment) (3w25)     Raliegh Ip

## 2016-11-12 NOTE — Progress Notes (Signed)
PT Cancellation Note  Patient Details Name: Meghan Anderson MRN: [id number] DOB: [birth year]   Cancelled Treatment:    Reason Eval/Treat Not Completed: Medical issues which prohibited therapy.  Pt has had a bad day of lethargy, wheezing, respiratory problems and new dx of bronchopnemonia.  Will try back as able. 11/12/2016  Donnella Sham, PT [phone]  (pager)   Tessie Fass [name] 11/12/2016, 4:36 PM

## 2016-11-12 NOTE — Care Management Note (Signed)
Case Management Note  Patient Details  Name: Meghan Anderson MRN: [id number] Date of Birth: Apr 05, 1936  Subjective/Objective:     CM following for progression and d/c planning.                Action/Plan: 11/12/2016 Noted CM orders for [facility] services. Attempted to visit pt re [place] needs, however pt not as stable today and requiring EKG to be obtained at that time. CM will continue to follow and arrange [facility] services if appropriate as the pt status improves. Await further evaluation and medical stability.   Expected Discharge Date:                Expected Discharge Plan:     In-House Referral:     Discharge planning Services  CM Consult  Post Acute Care Choice:    Choice offered to:     DME Arranged:    DME Agency:     HH Arranged:    HH Agency:     Status of Service:  In process, will continue to follow  If discussed at Long Length of Stay Meetings, dates discussed:    Additional Comments:  Adron Bene, RN 11/12/2016, 1:34 PM

## 2016-11-12 NOTE — Progress Notes (Signed)
Patient Name: ROZANN HUGG Date of Encounter: 11/12/2016  Primary Cardiologist: New, Dr. [facility] Problem List     Principal Problem:   Acute respiratory failure with hypoxia [facility]) Active Problems:   Essential hypertension, benign   Hyperlipidemia   Hypothyroidism   Chronic renal disease, stage 3, moderately decreased glomerular filtration rate (GFR) between 30-59 mL/min/1.73 square meter   COPD exacerbation (HCC)   Troponin level elevated   Difficulty in walking, not elsewhere classified     Subjective   Lethargic, confused. Does not answer questions appropriately. She was not lethargic yesterday, repeat ABG being done.   Inpatient Medications    Scheduled Meds: . amLODipine  10 mg Oral Daily  . atorvastatin  40 mg Oral q1800  . ceFEPime (MAXIPIME) IV  1 g Intravenous Q24H  . furosemide      . heparin subcutaneous  5,000 Units Subcutaneous Q8H  . ipratropium  0.5 mg Nebulization Q6H  . levalbuterol  0.63 mg Nebulization Q6H  . [START ON 11/13/2016] levothyroxine  125 mcg Oral QAC breakfast  . magnesium oxide  400 mg Oral BID  . mexiletine  200 mg Oral Q12H  . pantoprazole  40 mg Oral Daily  . potassium chloride SA  10 mEq Oral Daily  . predniSONE  40 mg Oral Q breakfast  . vancomycin  1,000 mg Intravenous Q48H   Continuous Infusions:  PRN Meds: acetaminophen **OR** acetaminophen, bisacodyl, guaiFENesin-dextromethorphan, hydrALAZINE, levalbuterol, ondansetron **OR** ondansetron (ZOFRAN) IV, polyethylene glycol, temazepam   Vital Signs    Vitals:   11/12/16 0500 11/12/16 0645 11/12/16 0650 11/12/16 0855  BP:    (!) 158/88  Pulse:    75  Resp:    19  Temp:    97.6 F (36.4 C)  TempSrc:    Oral  SpO2:  (!) 86% 92% 95%  Weight: 136 lb 0.4 oz (61.7 kg)     Height:        Intake/Output Summary (Last 24 hours) at 11/12/16 1407 Last data filed at 11/12/16 1317  Gross per 24 hour  Intake              360 ml  Output              450 ml  Net               -90 ml   Filed Weights   11/10/16 2129 11/11/16 2205 11/12/16 0500  Weight: 130 lb 1.1 oz (59 kg) 136 lb 0.4 oz (61.7 kg) 136 lb 0.4 oz (61.7 kg)    Physical Exam    GEN: Elderly female  HEENT: Grossly normal.  Neck: Supple, no JVD, carotid bruits, or masses. Cardiac: RRR, no murmurs, rubs, or gallops. No clubbing, cyanosis, edema.  Radials/DP/PT 2+ and equal bilaterally.  Respiratory:  Respirations regular and unlabored, Diffuse inspiratory and expiratory wheezing  GI: Soft, nontender, nondistended, BS + x 4. MS: no deformity or atrophy. Skin: warm and dry, no rash. Neuro:  Strength and sensation are intact. Psych: AAOx3.  Normal affect.  Labs    CBC  Recent Labs  11/10/16 0241 11/12/16 0751  WBC 6.6 5.2  HGB 9.4* 9.6*  HCT 28.6* 29.8*  MCV 100.0 105.3*  PLT 235 [id number]   Basic Metabolic Panel  Recent Labs  11/10/16 0241 11/11/16 0717 11/12/16 0751  NA 145 143  --   K 3.0* 3.6  --   CL 99* 100*  --   CO2 34* 32  --  GLUCOSE 136* 90  --   BUN 32* 33*  --   CREATININE 1.98* 1.83*  --   CALCIUM 7.6* 7.7*  --   MG 1.5*  --   --   PHOS 5.0*  --  3.0   BNP (last 3 results)  Recent Labs  11/11/16 0717  BNP 864.8*    ProBNP (last 3 results) No results for input(s): PROBNP in the last 8760 hours.   Telemetry    NSR - Personally Reviewed  ECG    NSR with nonspecific ST changes in the anterolateral leads.  - Personally Reviewed  Radiology    Dg Chest Port 1 View  Result Date: 11/12/2016 CLINICAL DATA:  81 year old female with history of COPD presenting with low oxygen saturations. Congestion. Shortness of breath. Wheezing. EXAM: PORTABLE CHEST 1 VIEW COMPARISON:  Chest x-ray 11/08/2016. FINDINGS: New airspace consolidation in the perihilar aspect of the right lung partially obscuring the right heart border, concerning for developing bronchopneumonia likely involving the right middle lobe. Small bilateral pleural effusions. Cephalization of the  pulmonary vasculature, without frank pulmonary edema. Mild cardiomegaly. The patient is rotated to the left on today's exam, resulting in distortion of the mediastinal contours and reduced diagnostic sensitivity and specificity for mediastinal pathology. Atherosclerosis in the thoracic aorta. IMPRESSION: 1. Developing airspace consolidation in the right mid to lower lung, involving at least the right middle lobe, concerning for developing bronchopneumonia. Followup PA and lateral chest X-ray is recommended in 3-4 weeks following trial of antibiotic therapy to ensure resolution and exclude underlying malignancy. 2. Cardiomegaly with pulmonary venous congestion, but no frank pulmonary edema. 3. Small bilateral pleural effusions. 4. Aortic atherosclerosis. Electronically Signed   By: Vinnie Langton M.D.   On: 11/12/2016 12:27    Cardiac Studies  Transthoracic Echocardiography 11/11/16 Study Conclusions  - Left ventricle: The cavity size was normal. Wall thickness was   increased increased in a pattern of mild to moderate LVH.   Systolic function was moderately to severely reduced. The   estimated ejection fraction was in the range of 30% to 35%. Wall   motion was normal; there were no regional wall motion   abnormalities. - Mitral valve: There was moderate to severe regurgitation. - Left atrium: The atrium was mildly to moderately dilated. - Pulmonary arteries: Systolic pressure was mildly increased. PA   peak pressure: 34 mm Hg (S).    Patient Profile     Ms. Polanco is a 81 year old female with a past medical history of COPD, CKD, IIA (T3N0M0) rectal adenocarcinoma, S/P resection of rectal mass by Dr. Marcello Moores on 12/14/2015, now S/P concomitant chemoradiation, HLD recently transferred from [facility] after presenting there for chest pain and shortness of breath.   Assessment & Plan  1. NSVT: Much improved today. Started Mexiletine on 11/10/16.  2. Chest pain: has elevated troponin to 0.05.   Her family tells me that she complained of chest pain yesterday and today, described it as substernal pressure. Troponin at 0.05. Will need heart cath, Echo shows reduced EF of 30-35%. EF was 50-55% in 2016.   Her creatinine is 1.83, could do diagnostic cath and limit dye exposure.   3. COPD: continued wheezing, per pulmonary.   4. Acute on chronic kidney disease  5. Lethargy: patient is lethargic, somewhat obtunded on exam. This is a new finding, ABG done shows CO2 of 48, o2 64.6. ABG from 11/08/16 shows CO2 of 43.2 and o2 of 77.   Change in CO2 not  drastic enough to cause lethargy.   6. Acute on chronic systolic CHF: Will order 40mg  IV BID as her BNP is elevated and appears volume overloaded on exam. Also needs to be able to lie flat.    Signed, Arbutus Leas, NP    Patient seen and examined. Agree with assessment and plan.  I had a long discussion with the patient's family members who are at the bedside and discussed with the patient who became alert while I was in the room.  Patient in the past had previous documentation of normal systolic function.  She had developed significant substernal chest pressure and dyspnea leading to her current hospitalization and subsequent echo Doppler study has now shown a global reduction of left ventricular function to 30-35% ejection fraction.  Her BNP is elevated at 846 and c/w acute on chronic CHF.  She has developed nonsustained VT which has improved after initiation of lidocaine and mexiletine.  With her LV dysfunction, I will discuss with EP concerning possibly changing to amiodarone but patient has COPD which may you against switching therapy.  At present, will start  IV diuresis.  She ultimately will require an ischemic evaluation and ideally should undergo cardiac catheterization if her renal function allows.  With her significant LV dysfunction and ventricular ectopy, I would recommend her to a cardiac telemetry floor.  Chest x-ray today suggests  developing airspace consolidation in the right mid to lower lung, involving at least the right middle lobe, concerning for developing bronchopneumonia.  He currently is on vancomycin and cefepime.  I will empirically add isosorbide mononitrate with her previous chest pressure and tightness.    Time spent  35 minutes  Troy Sine, MD, [facility] 11/12/2016 3:12 PM

## 2016-11-12 NOTE — Progress Notes (Addendum)
[facility] PROGRESS NOTE  AZYAH RIJOS [id number] DOB: [birth year] DOA: 11/08/2016   PCP: Fransisca Kaufmann Dettinger, MD     Assessment/Plan: Principal Problem:   Acute respiratory failure with hypoxia ([place]) Active Problems:   Essential hypertension, benign   Hyperlipidemia   Hypothyroidism   Chronic renal disease, stage 3, moderately decreased glomerular filtration rate (GFR) between 30-59 mL/min/1.73 square meter   COPD exacerbation (HCC)   Troponin level elevated   Difficulty in walking, not elsewhere classified   Ms. Madron is a 36F with PMH significant for COPD, CKD III, GERD, HLD, HTN, prior rectal adenoca and hypothyroidism. She was admitted to [facility] on 12/29 with complaints of worsening shortness of breath x 1 day. She was dyspneic both with exertion and at rest. She denied associated cough, sputum, fever, chills, chest pain, palpitations. On presentation she was noted to be hypoxemic. She had a mild troponin leak at 0.05.  Within a few hours, she had increasing shortness of breath and wheezing, persistent hypertension and telemetry reported arrhythmias (bigeminy, trigeminy, brief runs of V tach). She was transferred to ICU. She was seen by cardiology with bedside echo showing EF ~30%. She was started on lidocaine, given iv lasix and transfer to [name] was recommended. She was also started on BiPAP for work of breathing.   Currently she is resting comfortably and saturating 96% on 1L O2. She is able to speak in complete sentences and says that her breathing feels much better. She has been short of breath for several days (since 12/26), but had an acute worsening the morning of 12/29. She had gone to her pulmonologist several days ago to get clearance for back surgery and that physician felt that she was acutely exacerbated and gave her a kenalog shot, prednisone and an antibiotic shot. Despite those interventions, she failed to improve. She is usually maintained on  Breo, Spiriva and prn nebs. She has never had heart trouble that she can recall. She has some discomfort in her abdomen and back pain that radiates to her left leg, but otherwise has no complaints  Assessment and plan Acute on chronic hypoxemic respiratory failure, acute COPD exacerbation, now HCAP Multifactorial secondary to newly identified cardiomyopathy Continue steroids Continue supplemental oxygen Continue nebulizer treatments CXR shows Developing airspace consolidation in the right mid to lower lung, involving at least the right middle lobe, concerning for developing Bronchopneumonia. Started on empiric abx for HCAP   Ventricular ectopy/cardiomyopathy, [TTE in 05/2015 w/ EF [id number], diastolic dysfunction] Now LV EF: 30% -   35%  significantly less ventricular ectopy after starting lidocaine.  Switched to Mexitil  Replete magnesium  Chest pain: has elevated troponin to 0.05. Will need ischemic workup, myoview vs cath  Acute on chronic kidney disease-baseline around 1.6-1.8 Monitor  Recent rectal adenoca s/p 5FU then Xeloda, currently off treatment  Hypothyroidism Markedly elevated TSH, free T4 pending  Essential hypertension Continue Norvasc 10 mg daily - Hold prinzide due to renal insufficiency - Added hydralazine 5 mg IV every 6 hours PRN for blood pressure above 150/90  Anemia of chronic disease - Hemoglobin 10.5 - Monitor CBC daily   Dyslipidemia - Continue Lipitor         DVT prophylaxsis heparin  Code Status:  Full code     Family Communication: Discussed in detail with the patient, all imaging results, lab results explained to the patient   Disposition Plan:  Continue telemetry      Consultants:  Cardiology  Critical care  Procedures:  None  Antibiotics: Anti-infectives    None         HPI/Subjective: Complaining of chest tightness , npo this am   Objective: Vitals:   11/12/16 0459 11/12/16 0500 11/12/16 0645 11/12/16  0650  BP: (!) 145/83     Pulse: 77     Resp: 18     Temp: 97.8 F (36.6 C)     TempSrc: Oral     SpO2: 95%  (!) 86% 92%  Weight:  61.7 kg (136 lb 0.4 oz)    Height:        Intake/Output Summary (Last 24 hours) at 11/12/16 0758 Last data filed at 11/12/16 0502  Gross per 24 hour  Intake              480 ml  Output                0 ml  Net              480 ml    Exam:  General Well nourished, well developed, no apparent distress, up to chair  HEENT No gross abnormalities. Oropharynx clear. Mallampati III, good dentition.  Pulmonary Prolonged expiratory phase.  Expiratory wheeze bilateral bases.  Cardiovascular Normal rate, regular rhythm. S1, s2. No m/r/g. Distal pulses palpable.  Abdomen Soft, mild diffuse tenderness to palpation, no guarding or rebound, non-distended, positive bowel sounds, no palpable organomegaly or masses. Normoresonant to percussion.  Musculoskeletal Grossly normal  Lymphatics No cervical, supraclavicular or axillary adenopathy.   Neurologic Grossly intact. No focal deficits.   Skin/Integuement No rash, no cyanosis, no clubbing. No edema.        Data Reviewed: I have personally reviewed following labs and imaging studies  Micro Results Recent Results (from the past 240 hour(s))  MRSA PCR Screening     Status: None   Collection Time: 11/08/16  4:20 PM  Result Value Ref Range Status   MRSA by PCR NEGATIVE NEGATIVE Final    Comment:        The GeneXpert MRSA Assay (FDA approved for NASAL specimens only), is one component of a comprehensive MRSA colonization surveillance program. It is not intended to diagnose MRSA infection nor to guide or monitor treatment for MRSA infections.     Radiology Reports Dg Chest 2 View  Result Date: 11/08/2016 CLINICAL DATA:  81 year old female with chest pain and shortness of breath since yesterday. Initial encounter. Personal history of colon cancer. EXAM: CHEST  2 VIEW COMPARISON:  CT Abdomen and Pelvis  05/22/2016. Chest radiographs 05/22/2016 and earlier. FINDINGS: Upright AP and lateral views of the chest. New small bilateral pleural effusions. Stable mild cardiomegaly. Other mediastinal contours are within normal limits. Visualized tracheal air column is within normal limits. No pneumothorax. No consolidation. Chronic diffuse increased pulmonary interstitium appears not significantly changed. No acute edema suspected. Osteopenia. Calcified aortic atherosclerosis. No acute osseous abnormality identified. Negative visible bowel gas pattern. IMPRESSION: New small bilateral pleural effusions. No other acute cardiopulmonary abnormality. Electronically Signed   By: Genevie Ann M.D.   On: 11/08/2016 10:57   Ct Lumbar Spine W Contrast  Result Date: 10/14/2016 CLINICAL DATA:  Low back and bilateral leg pain. Pain worse on the left radiating to the foot. EXAM: LUMBAR MYELOGRAM FLUOROSCOPY TIME:  2 minutes 8 seconds. 211.72 micro gray meter squared PROCEDURE: After thorough discussion of risks and benefits of the procedure including bleeding, infection, injury to nerves, blood vessels, adjacent structures as well as  headache and CSF leak, written and oral informed consent was obtained. Consent was obtained by Dr. Nelson Chimes. Time out form was completed. Patient was positioned prone on the fluoroscopy table. Local anesthesia was provided with 1% lidocaine without epinephrine after prepped and draped in the usual sterile fashion. Puncture was performed at L2-3 using a 3 1/2 inch 22-gauge spinal needle via left para median approach. Using a single pass through the dura, the needle was placed within the thecal sac, with return of clear CSF. 15 mL of Isovue 200 was injected into the thecal sac, with normal opacification of the nerve roots and cauda equina consistent with free flow within the subarachnoid space. Initial puncture attempt at L5-S1 did not result in any CSF return. I personally performed the lumbar puncture and  administered the intrathecal contrast. I also personally performed acquisition of the myelogram images. TECHNIQUE: Contiguous axial images were obtained through the Lumbar spine after the intrathecal infusion of infusion. Coronal and sagittal reconstructions were obtained of the axial image sets. COMPARISON:  CT abdomen 05/22/2016 FINDINGS: LUMBAR MYELOGRAM FINDINGS: Curvature convex to the left with the apex at L2-3. 3-4 mm retrolisthesis L4-5. Anterior extradural defects at L1-2, L2-3, L3-4, L4-5 and L5-S1. Lateral recess narrowing most pronounced on the right at L2-3 and L3-4 and bilaterally at L4-5 and L5-S1 could cause neural compression. Standing flexion extension views show some worsening of the stenosis at L4-5. Retrolisthesis reduces to about 2 mm. CT LUMBAR MYELOGRAM FINDINGS: T12-L1:  Normal. L1-2: Disc degeneration with near complete loss of disc height. Endplate osteophytes. Stenosis of both lateral recesses and foramina, right more than left. L2-3: Disc degeneration with loss of disc height. Endplate osteophytes and bulging of the disc. Facet hypertrophy. Mild lateral recess stenosis right more than left. L3-4: Disc degeneration more pronounced on the right with loss of disc height and extensive reactive sclerosis. Endplate osteophytes and bulging of the disc. Facet and ligamentous hypertrophy. Stenosis of the lateral recesses and foramina right more than left. Neural compression could occur on the right at this level. L4-5: Disc degeneration with loss of disc height and vacuum phenomenon. Retrolisthesis of 2 mm. Broad-based herniation of the disc more prominent in the left posterior lateral direction. Facet and ligamentous hypertrophy. Possible synovial cyst projecting inward from the facet on the left. Severe stenosis of the lateral recesses and foramina left worse than right. Neural compression is likely at this level, particularly on the left. L5-S1: Disc degeneration with loss of disc height.  Endplate osteophytes and bulging of the disc more prominent towards the left. Facet and ligamentous hypertrophy. Diminutive thecal sac. Stenosis of both subarticular lateral recesses and neural foramina left more than right. Neural compression could occur on either side, more likely the left. Bilateral sacroiliac osteoarthritis is noted. I think there are bilateral sacral insufficiency fractures which were not seen on the CT scan of July. IMPRESSION: Curvature convex to the left. Lateral recess and foraminal narrowing right more than left at L1-2, L2-3 and L3-4. No definite left-sided neural compression at those levels. Severe stenosis at L4-5 worse on the left than the right. Contributing factors include disc degeneration with a broad-based herniation more prominent towards the left in combination with facet and ligamentous hypertrophy which allows 2 mm of retrolisthesis. There is probably a synovial cyst projecting inward from the facet on the left. Neural compression could occur on either side, more likely in the left lateral recess and intervertebral foramen on the left that could affect the left L4  and L5 nerve roots. Certainly, right-sided neural compression could also be occurring. L5-S1 shows disc degeneration with endplate osteophytes and shallow protrusion of the disc more prominent towards the left. In combination with facet and ligamentous hypertrophy, there is stenosis of the subarticular lateral recesses and foramina, left more than right. Neural compression could occur on either side at this level as well, more likely the left. Bilateral sacral insufficiency fractures which were not demonstrated on the CT of 05/22/2016. Electronically Signed   By: Nelson Chimes M.D.   On: 10/14/2016 15:20   Dg Abd Portable 1v  Result Date: 11/09/2016 CLINICAL DATA:  Impaired nasogastric feeding tube EXAM: PORTABLE ABDOMEN - 1 VIEW COMPARISON:  CT from 05/22/2016, lumbar spine CT 10/14/2016 FINDINGS: The tip and  side port of a nasogastric tube is seen in the left upper quadrant in the expected location of the stomach. Scattered gas containing small large bowel loops in a nonobstructive pattern. Levoscoliosis with lumbar spondylosis of the dorsal spine. Urinary probe projects over the mid pelvis. Mild sclerosis about the sacroiliac joints bilaterally. Patient has sacral insufficiency fractures by CT. IMPRESSION: Gastric tube in the expected location the stomach. No bowel obstruction or free air. Levoconvex curvature of the lumbar spine with spondylosis. Electronically Signed   By: Ashley Royalty M.D.   On: 11/09/2016 23:28   Dg Myelography Lumbar Inj Lumbosacral  Result Date: 10/14/2016 CLINICAL DATA:  Low back and bilateral leg pain. Pain worse on the left radiating to the foot. EXAM: LUMBAR MYELOGRAM FLUOROSCOPY TIME:  2 minutes 8 seconds. 211.72 micro gray meter squared PROCEDURE: After thorough discussion of risks and benefits of the procedure including bleeding, infection, injury to nerves, blood vessels, adjacent structures as well as headache and CSF leak, written and oral informed consent was obtained. Consent was obtained by Dr. Nelson Chimes. Time out form was completed. Patient was positioned prone on the fluoroscopy table. Local anesthesia was provided with 1% lidocaine without epinephrine after prepped and draped in the usual sterile fashion. Puncture was performed at L2-3 using a 3 1/2 inch 22-gauge spinal needle via left para median approach. Using a single pass through the dura, the needle was placed within the thecal sac, with return of clear CSF. 15 mL of Isovue 200 was injected into the thecal sac, with normal opacification of the nerve roots and cauda equina consistent with free flow within the subarachnoid space. Initial puncture attempt at L5-S1 did not result in any CSF return. I personally performed the lumbar puncture and administered the intrathecal contrast. I also personally performed acquisition  of the myelogram images. TECHNIQUE: Contiguous axial images were obtained through the Lumbar spine after the intrathecal infusion of infusion. Coronal and sagittal reconstructions were obtained of the axial image sets. COMPARISON:  CT abdomen 05/22/2016 FINDINGS: LUMBAR MYELOGRAM FINDINGS: Curvature convex to the left with the apex at L2-3. 3-4 mm retrolisthesis L4-5. Anterior extradural defects at L1-2, L2-3, L3-4, L4-5 and L5-S1. Lateral recess narrowing most pronounced on the right at L2-3 and L3-4 and bilaterally at L4-5 and L5-S1 could cause neural compression. Standing flexion extension views show some worsening of the stenosis at L4-5. Retrolisthesis reduces to about 2 mm. CT LUMBAR MYELOGRAM FINDINGS: T12-L1:  Normal. L1-2: Disc degeneration with near complete loss of disc height. Endplate osteophytes. Stenosis of both lateral recesses and foramina, right more than left. L2-3: Disc degeneration with loss of disc height. Endplate osteophytes and bulging of the disc. Facet hypertrophy. Mild lateral recess stenosis right more than left.  L3-4: Disc degeneration more pronounced on the right with loss of disc height and extensive reactive sclerosis. Endplate osteophytes and bulging of the disc. Facet and ligamentous hypertrophy. Stenosis of the lateral recesses and foramina right more than left. Neural compression could occur on the right at this level. L4-5: Disc degeneration with loss of disc height and vacuum phenomenon. Retrolisthesis of 2 mm. Broad-based herniation of the disc more prominent in the left posterior lateral direction. Facet and ligamentous hypertrophy. Possible synovial cyst projecting inward from the facet on the left. Severe stenosis of the lateral recesses and foramina left worse than right. Neural compression is likely at this level, particularly on the left. L5-S1: Disc degeneration with loss of disc height. Endplate osteophytes and bulging of the disc more prominent towards the left. Facet  and ligamentous hypertrophy. Diminutive thecal sac. Stenosis of both subarticular lateral recesses and neural foramina left more than right. Neural compression could occur on either side, more likely the left. Bilateral sacroiliac osteoarthritis is noted. I think there are bilateral sacral insufficiency fractures which were not seen on the CT scan of July. IMPRESSION: Curvature convex to the left. Lateral recess and foraminal narrowing right more than left at L1-2, L2-3 and L3-4. No definite left-sided neural compression at those levels. Severe stenosis at L4-5 worse on the left than the right. Contributing factors include disc degeneration with a broad-based herniation more prominent towards the left in combination with facet and ligamentous hypertrophy which allows 2 mm of retrolisthesis. There is probably a synovial cyst projecting inward from the facet on the left. Neural compression could occur on either side, more likely in the left lateral recess and intervertebral foramen on the left that could affect the left L4 and L5 nerve roots. Certainly, right-sided neural compression could also be occurring. L5-S1 shows disc degeneration with endplate osteophytes and shallow protrusion of the disc more prominent towards the left. In combination with facet and ligamentous hypertrophy, there is stenosis of the subarticular lateral recesses and foramina, left more than right. Neural compression could occur on either side at this level as well, more likely the left. Bilateral sacral insufficiency fractures which were not demonstrated on the CT of 05/22/2016. Electronically Signed   By: Nelson Chimes M.D.   On: 10/14/2016 15:20     CBC  Recent Labs Lab 11/08/16 0857 11/10/16 0241  WBC 10.7* 6.6  HGB 10.5* 9.4*  HCT 32.2* 28.6*  PLT 290 235  MCV 105.6* 100.0  MCH 34.4* 32.9  MCHC 32.6 32.9  RDW 15.2 15.1  LYMPHSABS 0.7  --   MONOABS 0.7  --   EOSABS 0.0  --   BASOSABS 0.0  --     Chemistries   Recent  Labs Lab 11/08/16 0857 11/09/16 0237 11/10/16 0241 11/11/16 0717  NA 144  --  145 143  K 3.5  --  3.0* 3.6  CL 107  --  99* 100*  CO2 29  --  34* 32  GLUCOSE 112*  --  136* 90  BUN 28*  --  32* 33*  CREATININE 1.94*  --  1.98* 1.83*  CALCIUM 7.8*  --  7.6* 7.7*  MG  --  1.5* 1.5*  --    ------------------------------------------------------------------------------------------------------------------ estimated creatinine clearance is 21.4 mL/min (by C-G formula based on SCr of 1.83 mg/dL (H)). ------------------------------------------------------------------------------------------------------------------ No results for input(s): HGBA1C in the last 72 hours. ------------------------------------------------------------------------------------------------------------------ No results for input(s): CHOL, HDL, LDLCALC, TRIG, CHOLHDL, LDLDIRECT in the last 72 hours. ------------------------------------------------------------------------------------------------------------------ No results  for input(s): TSH, T4TOTAL, T3FREE, THYROIDAB in the last 72 hours.  Invalid input(s): FREET3 ------------------------------------------------------------------------------------------------------------------ No results for input(s): VITAMINB12, FOLATE, FERRITIN, TIBC, IRON, RETICCTPCT in the last 72 hours.  Coagulation profile No results for input(s): INR, PROTIME in the last 168 hours.  No results for input(s): DDIMER in the last 72 hours.  Cardiac Enzymes  Recent Labs Lab 11/08/16 1208 11/08/16 1628 11/08/16 2247  TROPONINI 0.05* 0.04* 0.05*   ------------------------------------------------------------------------------------------------------------------ Invalid input(s): POCBNP   CBG:  Recent Labs Lab 11/09/16 0847 11/09/16 1200 11/10/16 0740 11/10/16 1628 11/11/16 0721  GLUCAP 120* 129* 121* 142* 84       Studies: No results found.    No results found for:  HGBA1C Lab Results  Component Value Date   LDLCALC 171 (H) 07/05/2016   CREATININE 1.83 (H) 11/11/2016       Scheduled Meds: . amLODipine  10 mg Oral Daily  . atorvastatin  40 mg Oral q1800  . ipratropium  0.5 mg Nebulization QID  . levothyroxine  88 mcg Oral QAC breakfast  . methylPREDNISolone (SOLU-MEDROL) injection  40 mg Intravenous Daily  . mexiletine  200 mg Oral Q12H  . pantoprazole  40 mg Oral Daily  . potassium chloride SA  10 mEq Oral Daily   Continuous Infusions:   LOS: 4 days    Time spent: >30 MINS    [facility]  [facility] Pager [phone]. If 7PM-7AM, please contact night-coverage at [email or web address], password [facility] 11/12/2016, 7:58 AM  LOS: 4 days

## 2016-11-13 DIAGNOSIS — I420 Dilated cardiomyopathy: Secondary | ICD-10-CM

## 2016-11-13 DIAGNOSIS — E039 Hypothyroidism, unspecified: Secondary | ICD-10-CM

## 2016-11-13 LAB — CBC
HEMATOCRIT: 26.6 % — AB (ref 36.0–46.0)
Hemoglobin: 8.6 g/dL — ABNORMAL LOW (ref 12.0–15.0)
MCH: 32.7 pg (ref 26.0–34.0)
MCHC: 32.3 g/dL (ref 30.0–36.0)
MCV: 101.1 fL — AB (ref 78.0–100.0)
PLATELETS: 204 10*3/uL (ref 150–400)
RBC: 2.63 MIL/uL — ABNORMAL LOW (ref 3.87–5.11)
RDW: 14.8 % (ref 11.5–15.5)
WBC: 4.8 10*3/uL (ref 4.0–10.5)

## 2016-11-13 LAB — COMPREHENSIVE METABOLIC PANEL
ALBUMIN: 2.9 g/dL — AB (ref 3.5–5.0)
ALT: 22 U/L (ref 14–54)
AST: 23 U/L (ref 15–41)
Alkaline Phosphatase: 43 U/L (ref 38–126)
Anion gap: 12 (ref 5–15)
BUN: 29 mg/dL — AB (ref 6–20)
CHLORIDE: 97 mmol/L — AB (ref 101–111)
CO2: 34 mmol/L — ABNORMAL HIGH (ref 22–32)
CREATININE: 2.03 mg/dL — AB (ref 0.44–1.00)
Calcium: 7.8 mg/dL — ABNORMAL LOW (ref 8.9–10.3)
GFR calc Af Amer: 26 mL/min — ABNORMAL LOW (ref >60)
GFR, EST NON AFRICAN AMERICAN: 22 mL/min — AB (ref >60)
GLUCOSE: 103 mg/dL — AB (ref 65–99)
POTASSIUM: 3.2 mmol/L — AB (ref 3.5–5.1)
Sodium: 143 mmol/L (ref 135–145)
Total Bilirubin: 0.6 mg/dL (ref 0.3–1.2)
Total Protein: 5.6 g/dL — ABNORMAL LOW (ref 6.5–8.1)

## 2016-11-13 LAB — VITAMIN B12: Vitamin B-12: 264 pg/mL (ref 180–914)

## 2016-11-13 LAB — RETICULOCYTES
RBC.: 2.76 MIL/uL — ABNORMAL LOW (ref 3.87–5.11)
RETIC COUNT ABSOLUTE: 49.7 10*3/uL (ref 19.0–186.0)
RETIC CT PCT: 1.8 % (ref 0.4–3.1)

## 2016-11-13 LAB — IRON AND TIBC
IRON: 33 ug/dL (ref 28–170)
Saturation Ratios: 13 % (ref 10.4–31.8)
TIBC: 256 ug/dL (ref 250–450)
UIBC: 223 ug/dL

## 2016-11-13 LAB — T4: T4, Total: 2.4 ug/dL — ABNORMAL LOW (ref 4.5–12.0)

## 2016-11-13 LAB — FERRITIN: Ferritin: 116 ng/mL (ref 11–307)

## 2016-11-13 LAB — FOLATE: Folate: 9.7 ng/mL (ref >5.9)

## 2016-11-13 MED ORDER — POTASSIUM CHLORIDE CRYS ER 20 MEQ PO TBCR
40.0000 meq | EXTENDED_RELEASE_TABLET | Freq: Two times a day (BID) | ORAL | Status: AC
  Administered 2016-11-13 (×2): 40 meq via ORAL
  Filled 2016-11-13 (×2): qty 2

## 2016-11-13 MED ORDER — FUROSEMIDE 10 MG/ML IJ SOLN: 40.0000 mg | Freq: Three times a day (TID) | INTRAMUSCULAR | Status: DC

## 2016-11-13 MED ORDER — HYDRALAZINE HCL 25 MG PO TABS
12.5000 mg | ORAL_TABLET | Freq: Three times a day (TID) | ORAL | Status: DC
  Administered 2016-11-13 – 2016-11-14 (×3): 12.5 mg via ORAL
  Filled 2016-11-13 (×3): qty 1

## 2016-11-13 NOTE — Evaluation (Signed)
Clinical/Bedside Swallow Evaluation Patient Details  Name: Meghan Anderson MRN: [id number] Date of Birth: 25-Dec-1935  Today's Date: 11/13/2016 Time: SLP Start Time (ACUTE ONLY): 1210 SLP Stop Time (ACUTE ONLY): 1225 SLP Time Calculation (min) (ACUTE ONLY): 15 min  Past Medical History:  Past Medical History:  Diagnosis Date  . Arthritis   . Cataract   . Chronic bronchitis ([place])   . Chronic renal disease, stage 3, moderately decreased glomerular filtration rate (GFR) between 30-59 mL/min/1.73 square meter 02/21/2016  . Complication of anesthesia   . COPD (chronic obstructive pulmonary disease) ([place])   . GERD (gastroesophageal reflux disease)   . Hyperlipidemia   . Hypertension   . Hypothyroidism   . Murmur, heart   . PONV (postoperative nausea and vomiting)   . Rectal adenocarcinoma ([place])   . Thyroid disease    Past Surgical History:  Past Surgical History:  Procedure Laterality Date  . ABDOMINAL HYSTERECTOMY     complete  . COLONOSCOPY N/A 11/01/2015   RMR: Large fungating rectal mass precluded colonoscopy. Status post biopsy.   . COLONOSCOPY N/A 01/15/2016   Procedure: COLONOSCOPY;  Surgeon: [name], MD;  Location: [facility];  Service: Endoscopy;  Laterality: N/A;  130   . FLEXIBLE SIGMOIDOSCOPY N/A 05/17/2016   Procedure: FLEXIBLE SIGMOIDOSCOPY;  Surgeon: [name], MD;  Location: [facility];  Service: Endoscopy;  Laterality: N/A;  . Left ear    . TRANSANAL ENDOSCOPIC MICROSURGERY N/A 12/14/2015   Procedure: TRANSANAL ENDOSCOPIC MICROSURGERY OF RECTAL POLYP;  Surgeon: [name], MD;  Location: [facility] OR[facility];  Service: General;  Laterality: N/A;   HPI:  Meghan Anderson is a 64F with PMH significant for COPD, CKD III, GERD, HLD, HTN, prior rectal adenoca and hypothyroidism. She was admitted to [facility] on 12/29 with complaints of worsening shortness of breath x 1 day. She was dyspneic both with exertion and at rest. She denied associated cough, sputum, fever, chills, chest pain,  palpitations. On presentation she was noted to be hypoxemic. Currently she is able to speak in complete sentences and says that her breathing feels much better. She has been short of breath for several days (since 12/26), but had an acute worsening the morning of 12/29. Patient is now admitted for acute on chronic hypoxic respiratory failure again related to CHF and COPD exacerbation.  SLP bedside swallow evaluation ordered to rule out aspiration.     Assessment / Plan / Recommendation Clinical Impression   Pt presents with s/s of grossly intact oropharyngeal swallowing function.  Pt demonstrated good oral control of all boluses with what appeared to be timely swallow initiation.  No overt s/s of aspiration were evident with solids or liquids, even when challenged to take large consecutive sips via straw and when consuming with mixed consistencies.  Vocal quality remained clear before and after PO intake.  As a result, recommend that pt resume a regular diet with thin liquids.  Given that pt appears to be at baseline for swallowing function, no further ST follow up is indicated at this time.     Aspiration Risk  Mild aspiration risk due to COPD and concomitant espiratory status    Diet Recommendation  Regular, thin liquids  Medication Administration: Whole meds with liquid    Other  Recommendations Oral Care Recommendations: Oral care BID   Follow up Recommendations None        Swallow Study   General Date of Onset: 11/08/16 HPI: Meghan Anderson is a 53F with PMH significant for  COPD, CKD III, GERD, HLD, HTN, prior rectal adenoca and hypothyroidism. She was admitted to [facility] on 12/29 with complaints of worsening shortness of breath x 1 day. She was dyspneic both with exertion and at rest. She denied associated cough, sputum, fever, chills, chest pain, palpitations. On presentation she was noted to be hypoxemic. Currently she is able to speak in complete sentences and says that her breathing feels much  better. She has been short of breath for several days (since 12/26), but had an acute worsening the morning of 12/29. Patient is now admitted for acute on chronic hypoxic respiratory failure again related to CHF and COPD exacerbation.  SLP bedside swallow evaluation ordered to rule out aspiration.   Type of Study: Bedside Swallow Evaluation Previous Swallow Assessment: none on record  Diet Prior to this Study: NPO Temperature Spikes Noted: No Respiratory Status: Nasal cannula (3L) History of Recent Intubation: No Behavior/Cognition: Alert;Cooperative;Pleasant mood Oral Cavity Assessment: Within Functional Limits Oral Care Completed by SLP: Yes Oral Cavity - Dentition: Dentures, top Vision: Functional for self-feeding Self-Feeding Abilities: Able to feed self Patient Positioning: Upright in bed Baseline Vocal Quality: Normal Volitional Cough: Strong Volitional Swallow: Able to elicit    Oral/Motor/Sensory Function Overall Oral Motor/Sensory Function: Within functional limits   Ice Chips     Thin Liquid Thin Liquid: Within functional limits    Nectar Thick     Honey Thick     Puree Puree: Within functional limits   Solid   GO   Solid: Within functional limits        Janene Yousuf, Elmyra Ricks L 11/13/2016,1:05 PM

## 2016-11-13 NOTE — Progress Notes (Signed)
Physical Therapy Treatment Patient Details Name: Meghan Anderson MRN: [id number] DOB: 02/04/1936 Today's Date: 11/13/2016    History of Present Illness HPI: Meghan Anderson is a 81 y.o. female with medical history significant for hypothyroidism, GERD, COPD, chronic kidney disease stage III. Patient presented with worsening shortness of breath started for past 1 day prior to this admission. Patient reported feeling short of breath at rest and with exertion.     PT Comments    Pt ambulated on and off O2, sats 83-84% on RA, low 90's on 2L O2. Symptomatically not much difference for pt on and off O2 but she seemed to have more stamina when on. Min-guard A needed and pt reliant on RW, especially with increased distance due to back pain. PT will continue to follow.   Follow Up Recommendations  Home health PT;Supervision/Assistance - 24 hour     Equipment Recommendations  None recommended by PT    Recommendations for Other Services       Precautions / Restrictions Precautions Precautions: Fall Restrictions Weight Bearing Restrictions: No    Mobility  Bed Mobility Overal bed mobility: Needs Assistance Bed Mobility: Supine to Sit     Supine to sit: Supervision     General bed mobility comments: increased time, use of rail, no physical assist needed  Transfers Overall transfer level: Needs assistance Equipment used: Rolling walker (2 wheeled) Transfers: Sit to/from Stand Sit to Stand: Min guard         General transfer comment: practiced from bed, recliner, and toilet, no physical assist needed  Ambulation/Gait Ambulation/Gait assistance: Min guard Ambulation Distance (Feet): 300 Feet (150' x2) Assistive device: Rolling walker (2 wheeled) Gait Pattern/deviations: Decreased stride length;Trunk flexed Gait velocity: decreased Gait velocity interpretation: Below normal speed for age/gender General Gait Details: ambulated first 150' on RA, slow pace, O2 sats down to 83%.  Took 3 min seated rest break and then 150' again on 2L O2, slightly increased pace and O2 sats maintained low 90's. Pt began having back pain last 68' of ambulation and needed to sit   Stairs            Wheelchair Mobility    Modified Rankin (Stroke Patients Only)       Balance Overall balance assessment: Needs assistance Sitting-balance support: No upper extremity supported;Feet supported Sitting balance-Leahy Scale: Good     Standing balance support: Bilateral upper extremity supported Standing balance-Leahy Scale: Poor Standing balance comment: reliant on RW                    Cognition Arousal/Alertness: Awake/alert Behavior During Therapy: WFL for tasks assessed/performed Overall Cognitive Status: Within Functional Limits for tasks assessed                      Exercises      General Comments        Pertinent Vitals/Pain Pain Assessment: Faces Faces Pain Scale: Hurts even more Pain Location: back radiating into hips and numbness RLE Pain Descriptors / Indicators: Numbness Pain Intervention(s): Limited activity within patient's tolerance;Monitored during session    Home Living                      Prior Function            PT Goals (current goals can now be found in the care plan section) Acute Rehab PT Goals Patient Stated Goal: home and hopeful for back sx soon PT Goal Formulation:  With patient Time For Goal Achievement: 11/25/16 Potential to Achieve Goals: Good Progress towards PT goals: Progressing toward goals    Frequency    Min 3X/week      PT Plan Current plan remains appropriate    Co-evaluation             End of Session Equipment Utilized During Treatment: Gait belt;Oxygen Activity Tolerance: Patient tolerated treatment well Patient left: in chair;with call bell/phone within reach;with family/visitor present     Time: 1418-1500 PT Time Calculation (min) (ACUTE ONLY): 42 min  Charges:  $Gait  Training: 23-37 mins $Therapeutic Activity: 8-22 mins                    G Codes:     Leighton Roach, PT  Acute Rehab Services  [place] 11/13/2016, 4:18 PM

## 2016-11-13 NOTE — Progress Notes (Signed)
[facility] PROGRESS NOTE  Meghan Anderson [id number] DOB: 04-15-1936 DOA: 11/08/2016   PCP: Fransisca Kaufmann Dettinger, MD     Assessment/Plan: Principal Problem:   Acute respiratory failure with hypoxia ([place]) Active Problems:   Essential hypertension, benign   Hyperlipidemia   Hypothyroidism   Chronic renal disease, stage 3, moderately decreased glomerular filtration rate (GFR) between 30-59 mL/min/1.73 square meter   COPD exacerbation (H[facility])   Troponin level elevated   Difficulty in walking, not elsewhere classified   SOB (shortness of breath)   Pure hypercholesterolemia   Cardiomyopathy ([place])   Meghan Anderson is a 81F with PMH significant for COPD, CKD III, GERD, HLD, HTN, prior rectal adenoca and hypothyroidism. She was admitted to [facility] on 12/29 with complaints of worsening shortness of breath x 1 day. She was dyspneic both with exertion and at rest. She denied associated cough, sputum, fever, chills, chest pain, palpitations. On presentation she was noted to be hypoxemic. She had a mild troponin leak at 0.05.  Within a few hours, she had increasing shortness of breath and wheezing, persistent hypertension and telemetry reported arrhythmias (bigeminy, trigeminy, brief runs of V tach). She was transferred to ICU. She was seen by cardiology with bedside echo showing EF ~30%. She was started on lidocaine, given iv lasix and transfer to [name] was recommended. She was also started on BiPAP for work of breathing.   Currently she is resting comfortably and saturating 96% on 1L O2. She is able to speak in complete sentences and says that her breathing feels much better. She has been short of breath for several days (since 12/26), but had an acute worsening the morning of 12/29. She had gone to her pulmonologist several days ago to get clearance for back surgery and that physician felt that she was acutely exacerbated and gave her a kenalog shot, prednisone and an antibiotic shot.  Despite those interventions, she failed to improve. She is usually maintained on Breo, Spiriva and prn nebs.  Patient is now admitted for acute on chronic hypoxic respiratory failure again related to CHF and COPD exacerbation  Assessment and plan Acute on chronic hypoxemic respiratory failure, acute COPD exacerbation, now HCAP Multifactorial secondary to newly identified cardiomyopathy Continue steroids Continue supplemental oxygen Continue nebulizer treatments CXR 1/2 shows Developing airspace consolidation in the right mid to lower lung, involving at least the right middle lobe, concerning for developing Bronchopneumonia. Started on empiric abx for HCAP 11/12/16 Speech therapy evaluation to rule out aspiration   Ventricular ectopy/cardiomyopathy, [TTE in 05/2015 w/ EF [id number], diastolic dysfunction] Now LV EF: 30% -   35%  significantly less ventricular ectopy after starting lidocaine.  Switched to Mexitil  Replete magnesium Currently on Lasix, managed by cardiology   Chest pain: has elevated troponin to 0.05. Will need ischemic workup, ideally she will need cardiac catheterization  Acute on chronic kidney disease-baseline around 1.6-1.8, renal function trending up secondary to diuresis Monitor  Recent rectal adenoca s/p 5FU then Xeloda, currently off treatment  Hypothyroidism Markedly elevated TSH, free T4 low. Dose of Synthroid increased to 125 g a day  Essential hypertension Continue Norvasc 10 mg daily - Hold prinzide due to renal insufficiency - Added hydralazine 5 mg IV every 6 hours PRN for blood pressure above 150/90  Anemia of chronic disease/microcytic anemia - Baseline Hemoglobin 10.5 - Monitor CBC daily , check an anemia panel  Dyslipidemia - Continue Lipitor    Hypokalemia-replete      DVT  prophylaxsis heparin  Code Status:  Full code     Family Communication: Discussed in detail with the patient, all imaging results, lab results explained to the  patient   Disposition Plan:  Continue telemetry      Consultants:  Cardiology  Critical care  Procedures:  None  Antibiotics: Anti-infectives    Start     Dose/Rate Route Frequency Ordered Stop   11/12/16 1500  ceFEPIme (MAXIPIME) 1 g in dextrose 5 % 50 mL IVPB     1 g 100 mL/hr over 30 Minutes Intravenous Every 24 hours 11/12/16 1352     11/12/16 1500  vancomycin (VANCOCIN) IVPB 1000 mg/200 mL premix     1,000 mg 200 mL/hr over 60 Minutes Intravenous Every 48 hours 11/12/16 1352           HPI/Subjective: Much more awake alert and oriented today, according to the patient's daughter mental status is back to baseline Shortness of breath significantly improved  Objective: Vitals:   11/12/16 1741 11/12/16 2010 11/13/16 0037 11/13/16 0500  BP: (!) 155/93 127/82 139/87   Pulse: 80 78 79   Resp: 18 16 17    Temp: 97.8 F (36.6 C) 98.8 F (37.1 C) 98.4 F (36.9 C) 99.1 F (37.3 C)  TempSrc: Oral Oral Oral Oral  SpO2: 94% 97% 99%   Weight:    57.4 kg (126 lb 9.6 oz)  Height:        Intake/Output Summary (Last 24 hours) at 11/13/16 0757 Last data filed at 11/12/16 1504  Gross per 24 hour  Intake              370 ml  Output              450 ml  Net              -80 ml    Exam:  General Well nourished, well developed, no apparent distress, up to chair  HEENT No gross abnormalities. Oropharynx clear. Mallampati III, good dentition.  Pulmonary Prolonged expiratory phase.  Expiratory wheeze bilateral bases.  Cardiovascular Normal rate, regular rhythm. S1, s2. No m/r/g. Distal pulses palpable.  Abdomen Soft, mild diffuse tenderness to palpation, no guarding or rebound, non-distended, positive bowel sounds, no palpable organomegaly or masses. Normoresonant to percussion.  Musculoskeletal Grossly normal  Lymphatics No cervical, supraclavicular or axillary adenopathy.   Neurologic Grossly intact. No focal deficits.   Skin/Integuement No rash, no cyanosis, no  clubbing. No edema.        Data Reviewed: I have personally reviewed following labs and imaging studies  Micro Results Recent Results (from the past 240 hour(s))  MRSA PCR Screening     Status: None   Collection Time: 11/08/16  4:20 PM  Result Value Ref Range Status   MRSA by PCR NEGATIVE NEGATIVE Final    Comment:        The GeneXpert MRSA Assay (FDA approved for NASAL specimens only), is one component of a comprehensive MRSA colonization surveillance program. It is not intended to diagnose MRSA infection nor to guide or monitor treatment for MRSA infections.     Radiology Reports Dg Chest 2 View  Result Date: 11/08/2016 CLINICAL DATA:  81 year old female with chest pain and shortness of breath since yesterday. Initial encounter. Personal history of colon cancer. EXAM: CHEST  2 VIEW COMPARISON:  CT Abdomen and Pelvis 05/22/2016. Chest radiographs 05/22/2016 and earlier. FINDINGS: Upright AP and lateral views of the chest. New small bilateral pleural effusions. Stable mild  cardiomegaly. Other mediastinal contours are within normal limits. Visualized tracheal air column is within normal limits. No pneumothorax. No consolidation. Chronic diffuse increased pulmonary interstitium appears not significantly changed. No acute edema suspected. Osteopenia. Calcified aortic atherosclerosis. No acute osseous abnormality identified. Negative visible bowel gas pattern. IMPRESSION: New small bilateral pleural effusions. No other acute cardiopulmonary abnormality. Electronically Signed   By: Genevie Ann M.D.   On: 11/08/2016 10:57   Ct Lumbar Spine W Contrast  Result Date: 10/14/2016 CLINICAL DATA:  Low back and bilateral leg pain. Pain worse on the left radiating to the foot. EXAM: LUMBAR MYELOGRAM FLUOROSCOPY TIME:  2 minutes 8 seconds. 211.72 micro gray meter squared PROCEDURE: After thorough discussion of risks and benefits of the procedure including bleeding, infection, injury to nerves, blood  vessels, adjacent structures as well as headache and CSF leak, written and oral informed consent was obtained. Consent was obtained by Dr. Nelson Chimes. Time out form was completed. Patient was positioned prone on the fluoroscopy table. Local anesthesia was provided with 1% lidocaine without epinephrine after prepped and draped in the usual sterile fashion. Puncture was performed at L2-3 using a 3 1/2 inch 22-gauge spinal needle via left para median approach. Using a single pass through the dura, the needle was placed within the thecal sac, with return of clear CSF. 15 mL of Isovue 200 was injected into the thecal sac, with normal opacification of the nerve roots and cauda equina consistent with free flow within the subarachnoid space. Initial puncture attempt at L5-S1 did not result in any CSF return. I personally performed the lumbar puncture and administered the intrathecal contrast. I also personally performed acquisition of the myelogram images. TECHNIQUE: Contiguous axial images were obtained through the Lumbar spine after the intrathecal infusion of infusion. Coronal and sagittal reconstructions were obtained of the axial image sets. COMPARISON:  CT abdomen 05/22/2016 FINDINGS: LUMBAR MYELOGRAM FINDINGS: Curvature convex to the left with the apex at L2-3. 3-4 mm retrolisthesis L4-5. Anterior extradural defects at L1-2, L2-3, L3-4, L4-5 and L5-S1. Lateral recess narrowing most pronounced on the right at L2-3 and L3-4 and bilaterally at L4-5 and L5-S1 could cause neural compression. Standing flexion extension views show some worsening of the stenosis at L4-5. Retrolisthesis reduces to about 2 mm. CT LUMBAR MYELOGRAM FINDINGS: T12-L1:  Normal. L1-2: Disc degeneration with near complete loss of disc height. Endplate osteophytes. Stenosis of both lateral recesses and foramina, right more than left. L2-3: Disc degeneration with loss of disc height. Endplate osteophytes and bulging of the disc. Facet hypertrophy.  Mild lateral recess stenosis right more than left. L3-4: Disc degeneration more pronounced on the right with loss of disc height and extensive reactive sclerosis. Endplate osteophytes and bulging of the disc. Facet and ligamentous hypertrophy. Stenosis of the lateral recesses and foramina right more than left. Neural compression could occur on the right at this level. L4-5: Disc degeneration with loss of disc height and vacuum phenomenon. Retrolisthesis of 2 mm. Broad-based herniation of the disc more prominent in the left posterior lateral direction. Facet and ligamentous hypertrophy. Possible synovial cyst projecting inward from the facet on the left. Severe stenosis of the lateral recesses and foramina left worse than right. Neural compression is likely at this level, particularly on the left. L5-S1: Disc degeneration with loss of disc height. Endplate osteophytes and bulging of the disc more prominent towards the left. Facet and ligamentous hypertrophy. Diminutive thecal sac. Stenosis of both subarticular lateral recesses and neural foramina left more than right.  Neural compression could occur on either side, more likely the left. Bilateral sacroiliac osteoarthritis is noted. I think there are bilateral sacral insufficiency fractures which were not seen on the CT scan of July. IMPRESSION: Curvature convex to the left. Lateral recess and foraminal narrowing right more than left at L1-2, L2-3 and L3-4. No definite left-sided neural compression at those levels. Severe stenosis at L4-5 worse on the left than the right. Contributing factors include disc degeneration with a broad-based herniation more prominent towards the left in combination with facet and ligamentous hypertrophy which allows 2 mm of retrolisthesis. There is probably a synovial cyst projecting inward from the facet on the left. Neural compression could occur on either side, more likely in the left lateral recess and intervertebral foramen on the left  that could affect the left L4 and L5 nerve roots. Certainly, right-sided neural compression could also be occurring. L5-S1 shows disc degeneration with endplate osteophytes and shallow protrusion of the disc more prominent towards the left. In combination with facet and ligamentous hypertrophy, there is stenosis of the subarticular lateral recesses and foramina, left more than right. Neural compression could occur on either side at this level as well, more likely the left. Bilateral sacral insufficiency fractures which were not demonstrated on the CT of 05/22/2016. Electronically Signed   By: Nelson Chimes M.D.   On: 10/14/2016 15:20   Dg Abd Portable 1v  Result Date: 11/09/2016 CLINICAL DATA:  Impaired nasogastric feeding tube EXAM: PORTABLE ABDOMEN - 1 VIEW COMPARISON:  CT from 05/22/2016, lumbar spine CT 10/14/2016 FINDINGS: The tip and side port of a nasogastric tube is seen in the left upper quadrant in the expected location of the stomach. Scattered gas containing small large bowel loops in a nonobstructive pattern. Levoscoliosis with lumbar spondylosis of the dorsal spine. Urinary probe projects over the mid pelvis. Mild sclerosis about the sacroiliac joints bilaterally. Patient has sacral insufficiency fractures by CT. IMPRESSION: Gastric tube in the expected location the stomach. No bowel obstruction or free air. Levoconvex curvature of the lumbar spine with spondylosis. Electronically Signed   By: Ashley Royalty M.D.   On: 11/09/2016 23:28   Dg Chest Port 1 View  Result Date: 11/12/2016 CLINICAL DATA:  81 year old female with history of COPD presenting with low oxygen saturations. Congestion. Shortness of breath. Wheezing. EXAM: PORTABLE CHEST 1 VIEW COMPARISON:  Chest x-ray 11/08/2016. FINDINGS: New airspace consolidation in the perihilar aspect of the right lung partially obscuring the right heart border, concerning for developing bronchopneumonia likely involving the right middle lobe. Small  bilateral pleural effusions. Cephalization of the pulmonary vasculature, without frank pulmonary edema. Mild cardiomegaly. The patient is rotated to the left on today's exam, resulting in distortion of the mediastinal contours and reduced diagnostic sensitivity and specificity for mediastinal pathology. Atherosclerosis in the thoracic aorta. IMPRESSION: 1. Developing airspace consolidation in the right mid to lower lung, involving at least the right middle lobe, concerning for developing bronchopneumonia. Followup PA and lateral chest X-ray is recommended in 3-4 weeks following trial of antibiotic therapy to ensure resolution and exclude underlying malignancy. 2. Cardiomegaly with pulmonary venous congestion, but no frank pulmonary edema. 3. Small bilateral pleural effusions. 4. Aortic atherosclerosis. Electronically Signed   By: Vinnie Langton M.D.   On: 11/12/2016 12:27   Dg Myelography Lumbar Inj Lumbosacral  Result Date: 10/14/2016 CLINICAL DATA:  Low back and bilateral leg pain. Pain worse on the left radiating to the foot. EXAM: LUMBAR MYELOGRAM FLUOROSCOPY TIME:  2 minutes 8  seconds. 211.72 micro gray meter squared PROCEDURE: After thorough discussion of risks and benefits of the procedure including bleeding, infection, injury to nerves, blood vessels, adjacent structures as well as headache and CSF leak, written and oral informed consent was obtained. Consent was obtained by Dr. Nelson Chimes. Time out form was completed. Patient was positioned prone on the fluoroscopy table. Local anesthesia was provided with 1% lidocaine without epinephrine after prepped and draped in the usual sterile fashion. Puncture was performed at L2-3 using a 3 1/2 inch 22-gauge spinal needle via left para median approach. Using a single pass through the dura, the needle was placed within the thecal sac, with return of clear CSF. 15 mL of Isovue 200 was injected into the thecal sac, with normal opacification of the nerve roots  and cauda equina consistent with free flow within the subarachnoid space. Initial puncture attempt at L5-S1 did not result in any CSF return. I personally performed the lumbar puncture and administered the intrathecal contrast. I also personally performed acquisition of the myelogram images. TECHNIQUE: Contiguous axial images were obtained through the Lumbar spine after the intrathecal infusion of infusion. Coronal and sagittal reconstructions were obtained of the axial image sets. COMPARISON:  CT abdomen 05/22/2016 FINDINGS: LUMBAR MYELOGRAM FINDINGS: Curvature convex to the left with the apex at L2-3. 3-4 mm retrolisthesis L4-5. Anterior extradural defects at L1-2, L2-3, L3-4, L4-5 and L5-S1. Lateral recess narrowing most pronounced on the right at L2-3 and L3-4 and bilaterally at L4-5 and L5-S1 could cause neural compression. Standing flexion extension views show some worsening of the stenosis at L4-5. Retrolisthesis reduces to about 2 mm. CT LUMBAR MYELOGRAM FINDINGS: T12-L1:  Normal. L1-2: Disc degeneration with near complete loss of disc height. Endplate osteophytes. Stenosis of both lateral recesses and foramina, right more than left. L2-3: Disc degeneration with loss of disc height. Endplate osteophytes and bulging of the disc. Facet hypertrophy. Mild lateral recess stenosis right more than left. L3-4: Disc degeneration more pronounced on the right with loss of disc height and extensive reactive sclerosis. Endplate osteophytes and bulging of the disc. Facet and ligamentous hypertrophy. Stenosis of the lateral recesses and foramina right more than left. Neural compression could occur on the right at this level. L4-5: Disc degeneration with loss of disc height and vacuum phenomenon. Retrolisthesis of 2 mm. Broad-based herniation of the disc more prominent in the left posterior lateral direction. Facet and ligamentous hypertrophy. Possible synovial cyst projecting inward from the facet on the left. Severe  stenosis of the lateral recesses and foramina left worse than right. Neural compression is likely at this level, particularly on the left. L5-S1: Disc degeneration with loss of disc height. Endplate osteophytes and bulging of the disc more prominent towards the left. Facet and ligamentous hypertrophy. Diminutive thecal sac. Stenosis of both subarticular lateral recesses and neural foramina left more than right. Neural compression could occur on either side, more likely the left. Bilateral sacroiliac osteoarthritis is noted. I think there are bilateral sacral insufficiency fractures which were not seen on the CT scan of July. IMPRESSION: Curvature convex to the left. Lateral recess and foraminal narrowing right more than left at L1-2, L2-3 and L3-4. No definite left-sided neural compression at those levels. Severe stenosis at L4-5 worse on the left than the right. Contributing factors include disc degeneration with a broad-based herniation more prominent towards the left in combination with facet and ligamentous hypertrophy which allows 2 mm of retrolisthesis. There is probably a synovial cyst projecting inward from the  facet on the left. Neural compression could occur on either side, more likely in the left lateral recess and intervertebral foramen on the left that could affect the left L4 and L5 nerve roots. Certainly, right-sided neural compression could also be occurring. L5-S1 shows disc degeneration with endplate osteophytes and shallow protrusion of the disc more prominent towards the left. In combination with facet and ligamentous hypertrophy, there is stenosis of the subarticular lateral recesses and foramina, left more than right. Neural compression could occur on either side at this level as well, more likely the left. Bilateral sacral insufficiency fractures which were not demonstrated on the CT of 05/22/2016. Electronically Signed   By: Nelson Chimes M.D.   On: 10/14/2016 15:20     CBC  Recent  Labs Lab 11/08/16 0857 11/10/16 0241 11/12/16 0751  WBC 10.7* 6.6 5.2  HGB 10.5* 9.4* 9.6*  HCT 32.2* 28.6* 29.8*  PLT 290 235 205  MCV 105.6* 100.0 105.3*  MCH 34.4* 32.9 33.9  MCHC 32.6 32.9 32.2  RDW 15.2 15.1 15.4  LYMPHSABS 0.7  --   --   MONOABS 0.7  --   --   EOSABS 0.0  --   --   BASOSABS 0.0  --   --     Chemistries   Recent Labs Lab 11/08/16 0857 11/09/16 0237 11/10/16 0241 11/11/16 0717 11/12/16 2040 11/13/16 0625  NA 144  --  145 143  --  143  K 3.5  --  3.0* 3.6  --  3.2*  CL 107  --  99* 100*  --  97*  CO2 29  --  34* 32  --  34*  GLUCOSE 112*  --  136* 90  --  103*  BUN 28*  --  32* 33*  --  29*  CREATININE 1.94*  --  1.98* 1.83*  --  2.03*  CALCIUM 7.8*  --  7.6* 7.7*  --  7.8*  MG  --  1.5* 1.5*  --  2.0  --   AST  --   --   --   --   --  23  ALT  --   --   --   --   --  22  ALKPHOS  --   --   --   --   --  43  BILITOT  --   --   --   --   --  0.6   ------------------------------------------------------------------------------------------------------------------ estimated creatinine clearance is 18.6 mL/min (by C-G formula based on SCr of 2.03 mg/dL (H)). ------------------------------------------------------------------------------------------------------------------ No results for input(s): HGBA1C in the last 72 hours. ------------------------------------------------------------------------------------------------------------------ No results for input(s): CHOL, HDL, LDLCALC, TRIG, CHOLHDL, LDLDIRECT in the last 72 hours. ------------------------------------------------------------------------------------------------------------------  Recent Labs  11/12/16 0751  T4TOTAL 2.4*   ------------------------------------------------------------------------------------------------------------------ No results for input(s): VITAMINB12, FOLATE, FERRITIN, TIBC, IRON, RETICCTPCT in the last 72 hours.  Coagulation profile No results for input(s): INR,  PROTIME in the last 168 hours.  No results for input(s): DDIMER in the last 72 hours.  Cardiac Enzymes  Recent Labs Lab 11/08/16 1628 11/08/16 2247 11/12/16 1350  TROPONINI 0.04* 0.05* 0.08*   ------------------------------------------------------------------------------------------------------------------ Invalid input(s): POCBNP   CBG:  Recent Labs Lab 11/09/16 1200 11/10/16 0740 11/10/16 1628 11/11/16 0721 11/12/16 0805  GLUCAP 129* 121* 142* 84 105*       Studies: Dg Chest Port 1 View  Result Date: 11/12/2016 CLINICAL DATA:  81 year old female with history of COPD presenting with low oxygen saturations. Congestion. Shortness of  breath. Wheezing. EXAM: PORTABLE CHEST 1 VIEW COMPARISON:  Chest x-ray 11/08/2016. FINDINGS: New airspace consolidation in the perihilar aspect of the right lung partially obscuring the right heart border, concerning for developing bronchopneumonia likely involving the right middle lobe. Small bilateral pleural effusions. Cephalization of the pulmonary vasculature, without frank pulmonary edema. Mild cardiomegaly. The patient is rotated to the left on today's exam, resulting in distortion of the mediastinal contours and reduced diagnostic sensitivity and specificity for mediastinal pathology. Atherosclerosis in the thoracic aorta. IMPRESSION: 1. Developing airspace consolidation in the right mid to lower lung, involving at least the right middle lobe, concerning for developing bronchopneumonia. Followup PA and lateral chest X-ray is recommended in 3-4 weeks following trial of antibiotic therapy to ensure resolution and exclude underlying malignancy. 2. Cardiomegaly with pulmonary venous congestion, but no frank pulmonary edema. 3. Small bilateral pleural effusions. 4. Aortic atherosclerosis. Electronically Signed   By: Vinnie Langton M.D.   On: 11/12/2016 12:27      No results found for: HGBA1C Lab Results  Component Value Date   LDLCALC 171 (H)  07/05/2016   CREATININE 2.03 (H) 11/13/2016       Scheduled Meds: . amLODipine  10 mg Oral Daily  . atorvastatin  40 mg Oral q1800  . ceFEPime (MAXIPIME) IV  1 g Intravenous Q24H  . furosemide  40 mg Intravenous Q8H  . heparin subcutaneous  5,000 Units Subcutaneous Q8H  . ipratropium  0.5 mg Nebulization Q6H  . isosorbide mononitrate  30 mg Oral Daily  . levalbuterol  0.63 mg Nebulization Q6H  . levothyroxine  125 mcg Oral QAC breakfast  . magnesium oxide  400 mg Oral BID  . mexiletine  200 mg Oral Q12H  . pantoprazole  40 mg Oral Daily  . potassium chloride  40 mEq Oral BID  . predniSONE  40 mg Oral Q breakfast  . vancomycin  1,000 mg Intravenous Q48H   Continuous Infusions:   LOS: 5 days    Time spent: >30 MINS    [facility]  [facility] Pager [phone]. If 7PM-7AM, please contact night-coverage at [email or web address], password [facility] 11/13/2016, 7:57 AM  LOS: 5 days

## 2016-11-13 NOTE — Progress Notes (Signed)
Patient Name: Meghan Anderson Date of Encounter: 11/13/2016  Primary Cardiologist: Dr. Ladonna Snide Problem List     Principal Problem:   Acute respiratory failure with hypoxia [facility]) Active Problems:   Essential hypertension, benign   Hyperlipidemia   Hypothyroidism   Chronic renal disease, stage 3, moderately decreased glomerular filtration rate (GFR) between 30-59 mL/min/1.73 square meter   COPD exacerbation (HCC)   Troponin level elevated   Difficulty in walking, not elsewhere classified   SOB (shortness of breath)   Pure hypercholesterolemia   Cardiomyopathy ([place])     Subjective   Feels well today, more alert. Denies chest pain.   Inpatient Medications    Scheduled Meds: . amLODipine  10 mg Oral Daily  . atorvastatin  40 mg Oral q1800  . ceFEPime (MAXIPIME) IV  1 g Intravenous Q24H  . furosemide  40 mg Intravenous Q8H  . heparin subcutaneous  5,000 Units Subcutaneous Q8H  . ipratropium  0.5 mg Nebulization Q6H  . isosorbide mononitrate  30 mg Oral Daily  . levalbuterol  0.63 mg Nebulization Q6H  . levothyroxine  125 mcg Oral QAC breakfast  . magnesium oxide  400 mg Oral BID  . mexiletine  200 mg Oral Q12H  . pantoprazole  40 mg Oral Daily  . potassium chloride  40 mEq Oral BID  . predniSONE  40 mg Oral Q breakfast  . vancomycin  1,000 mg Intravenous Q48H   Continuous Infusions:  PRN Meds: acetaminophen **OR** acetaminophen, bisacodyl, guaiFENesin-dextromethorphan, hydrALAZINE, levalbuterol, ondansetron **OR** ondansetron (ZOFRAN) IV, polyethylene glycol, temazepam   Vital Signs    Vitals:   11/12/16 2010 11/13/16 0037 11/13/16 0500 11/13/16 0849  BP: 127/82 139/87    Pulse: 78 79    Resp: 16 17    Temp: 98.8 F (37.1 C) 98.4 F (36.9 C) 99.1 F (37.3 C)   TempSrc: Oral Oral Oral   SpO2: 97% 99%  99%  Weight:   126 lb 9.6 oz (57.4 kg)   Height:        Intake/Output Summary (Last 24 hours) at 11/13/16 1320 Last data filed at 11/12/16 1504  Gross per 24 hour  Intake              250 ml  Output                0 ml  Net              250 ml   Filed Weights   11/11/16 2205 11/12/16 0500 11/13/16 0500  Weight: 136 lb 0.4 oz (61.7 kg) 136 lb 0.4 oz (61.7 kg) 126 lb 9.6 oz (57.4 kg)    Physical Exam   GEN: Well nourished, well developed, in no acute distress.  HEENT: Grossly normal.  Neck: Supple, no JVD, carotid bruits, or masses. Cardiac: RRR, no murmurs, rubs, or gallops. No clubbing, cyanosis, edema.  Radials/DP/PT 2+ and equal bilaterally.  Respiratory:  Respirations regular and unlabored, Scattered wheezes  GI: Soft, nontender, nondistended, BS + x 4. MS: no deformity or atrophy. Skin: warm and dry, no rash. Neuro:  Strength and sensation are intact. Psych: AAOx3.  Normal affect.  Labs    CBC  Recent Labs  11/12/16 0751 11/13/16 0625  WBC 5.2 4.8  HGB 9.6* 8.6*  HCT 29.8* 26.6*  MCV 105.3* 101.1*  PLT 205 0000000   Basic Metabolic Panel  Recent Labs  11/11/16 0717 11/12/16 0751 11/12/16 2040 11/13/16 0625  NA 143  --   --  143  K 3.6  --   --  3.2*  CL 100*  --   --  97*  CO2 32  --   --  34*  GLUCOSE 90  --   --  103*  BUN 33*  --   --  29*  CREATININE 1.83*  --   --  2.03*  CALCIUM 7.7*  --   --  7.8*  MG  --   --  2.0  --   PHOS  --  3.0  --   --    Liver Function Tests  Recent Labs  11/13/16 0625  AST 23  ALT 22  ALKPHOS 43  BILITOT 0.6  PROT 5.6*  ALBUMIN 2.9*    Cardiac Enzymes  Recent Labs  11/12/16 1350  TROPONINI 0.08*   Thyroid Function Tests  Recent Labs  11/12/16 0751  T4TOTAL 2.4*   BNP    Component Value Date/Time   BNP 864.8 (H) 11/11/2016 0717    ProBNP No results found for: PROBNP  Telemetry    NSR- Personally Reviewed  ECG    NSR with nonspecific ST changes in the anterolateral leads- Personally Reviewed  Radiology    Dg Chest Port 1 View  Result Date: 11/12/2016 CLINICAL DATA:  81 year old female with history of COPD presenting with low  oxygen saturations. Congestion. Shortness of breath. Wheezing. EXAM: PORTABLE CHEST 1 VIEW COMPARISON:  Chest x-ray 11/08/2016. FINDINGS: New airspace consolidation in the perihilar aspect of the right lung partially obscuring the right heart border, concerning for developing bronchopneumonia likely involving the right middle lobe. Small bilateral pleural effusions. Cephalization of the pulmonary vasculature, without frank pulmonary edema. Mild cardiomegaly. The patient is rotated to the left on today's exam, resulting in distortion of the mediastinal contours and reduced diagnostic sensitivity and specificity for mediastinal pathology. Atherosclerosis in the thoracic aorta. IMPRESSION: 1. Developing airspace consolidation in the right mid to lower lung, involving at least the right middle lobe, concerning for developing bronchopneumonia. Followup PA and lateral chest X-ray is recommended in 3-4 weeks following trial of antibiotic therapy to ensure resolution and exclude underlying malignancy. 2. Cardiomegaly with pulmonary venous congestion, but no frank pulmonary edema. 3. Small bilateral pleural effusions. 4. Aortic atherosclerosis. Electronically Signed   By: Vinnie Langton M.D.   On: 11/12/2016 12:27    Cardiac Studies   Transthoracic Echocardiography 11/11/16 Study Conclusions  - Left ventricle: The cavity size was normal. Wall thickness was increased increased in a pattern of mild to moderate LVH. Systolic function was moderately to severely reduced. The estimated ejection fraction was in the range of 30% to 35%. Wall motion was normal; there were no regional wall motion abnormalities. - Mitral valve: There was moderate to severe regurgitation. - Left atrium: The atrium was mildly to moderately dilated. - Pulmonary arteries: Systolic pressure was mildly increased. PA peak pressure: 34 mm Hg (S).   Patient Profile  Meghan Anderson is a 81 year old female with a past medical  history of COPD, CKD, IIA (T3N0M0) rectal adenocarcinoma, S/P resection of rectal mass by Dr. Marcello Moores on 12/14/2015, now S/P concomitant chemoradiation, HLD recently transferred from [facility] after presenting there for chest pain and shortness of breath     Assessment & Plan    1. NSVT: Much improved today. Started Mexiletine on 11/10/16.  2. Chest pain: has elevated troponin to 0.05.  Her family tells me that she complained of chest pain yesterday and today, described it as substernal pressure. Troponin  at 0.05. Will need heart cath, Echo shows reduced EF of 30-35%. EF was 50-55% in 2016.   3. COPD: continued wheezing, per pulmonary.   4. Acute on chronic kidney disease: Creatinine is 2.03 today. MD to advise continuing IV Lasix.   5. Lethargy: Resolved   6. Acute on chronic systolic CHF: Started Lasix 40mg  IV BID as her BNP is elevated and appears volume overloaded on exam.  Start hydralazine 25mg  BID today for greater afterload reduction.    Signed, Arbutus Leas, NP  11/13/2016, 1:20 PM    Patient seen and examined. Agree with assessment and plan. No recurrent chest pain.  Breathing better, but diffuse rhonchi on exam. -1680 since admission; Cr increased today 2.03. Will dc furosemide. With EF 30 - 35% will add hydralazine at 12.5 q 8 hrs and continue nitrates which were initiated yesterday.  Repeat Bmet in am ? Cath on 11/15/16 if renal stabilizes.   Troy Sine, MD, [facility] 11/13/2016 2:27 PM

## 2016-11-14 ENCOUNTER — Other Ambulatory Visit: Payer: Self-pay

## 2016-11-14 LAB — COMPREHENSIVE METABOLIC PANEL
ALT: 27 U/L (ref 14–54)
ANION GAP: 8 (ref 5–15)
AST: 31 U/L (ref 15–41)
Albumin: 2.8 g/dL — ABNORMAL LOW (ref 3.5–5.0)
Alkaline Phosphatase: 46 U/L (ref 38–126)
BUN: 30 mg/dL — AB (ref 6–20)
CHLORIDE: 98 mmol/L — AB (ref 101–111)
CO2: 33 mmol/L — ABNORMAL HIGH (ref 22–32)
Calcium: 7.5 mg/dL — ABNORMAL LOW (ref 8.9–10.3)
Creatinine, Ser: 2.07 mg/dL — ABNORMAL HIGH (ref 0.44–1.00)
GFR calc Af Amer: 25 mL/min — ABNORMAL LOW (ref >60)
GFR, EST NON AFRICAN AMERICAN: 22 mL/min — AB (ref >60)
Glucose, Bld: 164 mg/dL — ABNORMAL HIGH (ref 65–99)
POTASSIUM: 4.4 mmol/L (ref 3.5–5.1)
Sodium: 139 mmol/L (ref 135–145)
Total Bilirubin: 0.4 mg/dL (ref 0.3–1.2)
Total Protein: 5.9 g/dL — ABNORMAL LOW (ref 6.5–8.1)

## 2016-11-14 LAB — CBC
HEMATOCRIT: 27.2 % — AB (ref 36.0–46.0)
HEMOGLOBIN: 8.7 g/dL — AB (ref 12.0–15.0)
MCH: 32.6 pg (ref 26.0–34.0)
MCHC: 32 g/dL (ref 30.0–36.0)
MCV: 101.9 fL — AB (ref 78.0–100.0)
Platelets: 188 10*3/uL (ref 150–400)
RBC: 2.67 MIL/uL — ABNORMAL LOW (ref 3.87–5.11)
RDW: 14.7 % (ref 11.5–15.5)
WBC: 5.3 10*3/uL (ref 4.0–10.5)

## 2016-11-14 LAB — PROCALCITONIN: PROCALCITONIN: 0.19 ng/mL

## 2016-11-14 LAB — BRAIN NATRIURETIC PEPTIDE: B Natriuretic Peptide: 617.2 pg/mL — ABNORMAL HIGH (ref 0.0–100.0)

## 2016-11-14 MED ORDER — TIOTROPIUM BROMIDE MONOHYDRATE 18 MCG IN CAPS
18.0000 ug | ORAL_CAPSULE | Freq: Every day | RESPIRATORY_TRACT | Status: DC
  Administered 2016-11-15 – 2016-11-18 (×4): 18 ug via RESPIRATORY_TRACT
  Filled 2016-11-14 (×2): qty 5

## 2016-11-14 MED ORDER — LEVALBUTEROL HCL 0.63 MG/3ML IN NEBU
0.6300 mg | INHALATION_SOLUTION | Freq: Four times a day (QID) | RESPIRATORY_TRACT | Status: DC
  Administered 2016-11-15 (×2): 0.63 mg via RESPIRATORY_TRACT
  Filled 2016-11-14 (×2): qty 3

## 2016-11-14 MED ORDER — HYDRALAZINE HCL 25 MG PO TABS
25.0000 mg | ORAL_TABLET | Freq: Three times a day (TID) | ORAL | Status: DC
  Administered 2016-11-14 – 2016-11-15 (×3): 25 mg via ORAL
  Filled 2016-11-14 (×3): qty 1

## 2016-11-14 MED ORDER — FLUTICASONE FUROATE-VILANTEROL 100-25 MCG/INH IN AEPB
1.0000 | INHALATION_SPRAY | Freq: Every day | RESPIRATORY_TRACT | Status: DC
  Administered 2016-11-15 – 2016-11-20 (×6): 1 via RESPIRATORY_TRACT
  Filled 2016-11-14: qty 28

## 2016-11-14 NOTE — Progress Notes (Signed)
Pt complained she was having some chest pain. When asked to rate it, she advised it was not really pain but some tightness in her chest. Performed an EKG and while doing that the patient released some flatus and stated she feels a little better. Advised pt to let me know if pain comes back or worsens. Will continue to monitor.

## 2016-11-14 NOTE — Progress Notes (Signed)
Physical Therapy Treatment Patient Details Name: Meghan Anderson MRN: [id number] DOB: 05/16/1936 Today's Date: 11/14/2016    History of Present Illness HPI: Meghan Anderson is a 81 y.o. female with medical history significant for hypothyroidism, GERD, COPD, chronic kidney disease stage III. Patient presented with worsening shortness of breath started for past 1 day prior to this admission. Patient reported feeling short of breath at rest and with exertion.     PT Comments    Patient progressing well towards PT goals. Not able to walk as much as yesterday as pt worn out from being up and getting a bath prior to PT arrival. Required supplemental 02 during exercise today- 84-93% on 2L/min 02. No back pain reported today during gait training however dyspnea on exertion that required a few minutes to resolve post activity. Pt complaining of dryness in nose and mouth. Son present during session. Reviewed pursed lip breathing. Will continue to follow.   Follow Up Recommendations  Home health PT;Supervision/Assistance - 24 hour     Equipment Recommendations  None recommended by PT    Recommendations for Other Services       Precautions / Restrictions Precautions Precautions: Fall Restrictions Weight Bearing Restrictions: No    Mobility  Bed Mobility Overal bed mobility: Needs Assistance Bed Mobility: Supine to Sit;Sit to Supine     Supine to sit: Supervision;HOB elevated Sit to supine: Supervision   General bed mobility comments: increased time, use of rail, no physical assist needed  Transfers Overall transfer level: Needs assistance Equipment used: Rolling walker (2 wheeled) Transfers: Sit to/from Stand Sit to Stand: Min guard         General transfer comment: Cues for hand placement/technique. Stood from [employer].   Ambulation/Gait Ambulation/Gait assistance: Min guard Ambulation Distance (Feet): 175 Feet Assistive device: Rolling walker (2 wheeled) Gait  Pattern/deviations: Step-through pattern;Decreased stride length;Trunk flexed Gait velocity: decreased   General Gait Details: Slow, mostly steady gait, ambulated on 2L/min 02. Sp02 ranged from 84-93% on 2L. 2/4 DOE. Cues for pursed lip breathing.    Stairs            Wheelchair Mobility    Modified Rankin (Stroke Patients Only)       Balance Overall balance assessment: Needs assistance Sitting-balance support: Feet supported;No upper extremity supported Sitting balance-Leahy Scale: Good     Standing balance support: During functional activity Standing balance-Leahy Scale: Poor Standing balance comment: reliant on RW                    Cognition Arousal/Alertness: Awake/alert Behavior During Therapy: WFL for tasks assessed/performed Overall Cognitive Status: Within Functional Limits for tasks assessed                      Exercises      General Comments General comments (skin integrity, edema, etc.): Son present during session.      Pertinent Vitals/Pain Pain Assessment: No/denies pain    Home Living                      Prior Function            PT Goals (current goals can now be found in the care plan section) Progress towards PT goals: Progressing toward goals    Frequency    Min 3X/week      PT Plan Current plan remains appropriate    Co-evaluation  End of Session Equipment Utilized During Treatment: Gait belt;Oxygen Activity Tolerance: Patient tolerated treatment well Patient left: in bed;with call bell/phone within reach;with family/visitor present     Time: [id number] PT Time Calculation (min) (ACUTE ONLY): 23 min  Charges:  $Gait Training: 8-22 mins $Therapeutic Exercise: 8-22 mins                    G Codes:      Meghan Anderson A Jupiter Kabir 11/14/2016, 9:25 AM Wray Kearns, [place], DPT [phone]

## 2016-11-14 NOTE — Progress Notes (Signed)
[facility] PROGRESS NOTE  Meghan Anderson [id number] DOB: 07/27/1936 DOA: 79/29/2017   PCP: Fransisca Kaufmann Dettinger, MD     Assessment/Plan: Principal Problem:   Acute respiratory failure with hypoxia ([place]) Active Problems:   Essential hypertension, benign   Hyperlipidemia   Hypothyroidism   Chronic renal disease, stage 3, moderately decreased glomerular filtration rate (GFR) between 30-59 mL/min/1.73 square meter   COPD exacerbation (HCC)   Troponin level elevated   Difficulty in walking, not elsewhere classified   SOB (shortness of breath)   Pure hypercholesterolemia   Cardiomyopathy ([place])   Ms. Treichel is a 81F with PMH significant for COPD, CKD III, GERD, HLD, HTN, prior rectal adenoca and hypothyroidism. She was admitted to [facility] on 12/29 with complaints of worsening shortness of breath x 1 day. She was dyspneic both with exertion and at rest. She denied associated cough, sputum, fever, chills, chest pain, palpitations. On presentation she was noted to be hypoxemic. She had a mild troponin leak at 0.05.  Within a few hours, she had increasing shortness of breath and wheezing, persistent hypertension and telemetry reported arrhythmias (bigeminy, trigeminy, brief runs of V tach). She was transferred to ICU. She was seen by cardiology with bedside echo showing EF ~30%. She was started on lidocaine, given iv lasix and transfer to [name] was recommended. She was also started on BiPAP for work of breathing.   She has been short of breath for several days (since 12/26), but had an acute worsening the morning of 12/29. She had gone to her pulmonologist several days ago to get clearance for back surgery and that physician felt that she was acutely exacerbated and gave her a kenalog shot, prednisone and an antibiotic shot. Despite those interventions, she failed to improve. She is usually maintained on Breo, Spiriva and prn nebs.  Patient is now admitted for acute on chronic  hypoxic respiratory failure again related to CHF and COPD exacerbation  Assessment and plan Acute on chronic hypoxemic respiratory failure, acute COPD exacerbation, now HCAP Multifactorial secondary to newly identified cardiomyopathy Continue steroids, resume [place] Spiriva Continue supplemental oxygen Continue nebulizer treatments CXR 1/2 shows Developing airspace consolidation in the right mid to lower lung, involving at least the right middle lobe, concerning for developing Bronchopneumonia. Started on empiric abx for HCAP 11/12/16 Speech therapy evaluation recommends regular diet and thin liquids   Ventricular ectopy/Acute on chronic systolic CHF  [TTE in [id number] w/ EF [id number], diastolic dysfunction] Now LV EF: 30% -   35%  significantly less ventricular ectopy after starting lidocaine.  Switched to Mexitil  Repleted magnesium Diuretics managed by cardiology Cr increased today 2.03>2.07. Cardiology held furosemide. With EF 30 - 35%   Added  hydralazine at 12.5 q 8 hrs and continue nitrates which were initiated yesterday. Hold off on ACE inhibitor Cath on 11/15/16 if renal stabilizes.  Chest pain: has elevated troponin to 0.05. Will need ischemic workup, ideally she will need cardiac catheterization.  Acute on chronic kidney disease-baseline around 1.6-1.8, renal function trending up secondary to diuresis Monitor renal function if she needs cardiac cath. Hold off on nephrotoxic medications   Recent rectal adenoca s/p 5FU then Xeloda, currently off treatment  Hypothyroidism Markedly elevated TSH, free T4 low. Dose of Synthroid increased to 125 g a day   Essential hypertension Continue Norvasc 10 mg daily  Now on hydralazine, Imdur,  Anemia of chronic disease/microcytic anemia - Baseline Hemoglobin 10.5 - Monitor CBC daily , check an  anemia panel  Dyslipidemia - Continue Lipitor    Hypokalemia-replete      DVT prophylaxsis heparin  Code Status:  Full code      Family Communication: Discussed in detail with the patient/son/daughter, all imaging results, lab results explained to the patient   Disposition Plan:  Continue telemetry      Consultants:  Cardiology  Critical care  Procedures:  None  Antibiotics: Anti-infectives    Start     Dose/Rate Route Frequency Ordered Stop   11/12/16 1500  ceFEPIme (MAXIPIME) 1 g in dextrose 5 % 50 mL IVPB     1 g 100 mL/hr over 30 Minutes Intravenous Every 24 hours 11/12/16 1352     11/12/16 1500  vancomycin (VANCOCIN) IVPB 1000 mg/200 mL premix     1,000 mg 200 mL/hr over 60 Minutes Intravenous Every 48 hours 11/12/16 1352           HPI/Subjective: Much more awake alert and oriented today. Denies any chest pain or shortness of breath  Objective: Vitals:   11/13/16 2057 11/13/16 2115 11/14/16 0203 11/14/16 0633  BP:      Pulse:      Resp:      Temp:  98.8 F (37.1 C)  98.2 F (36.8 C)  TempSrc:  Oral  Oral  SpO2: 99%  96%   Weight:    57.5 kg (126 lb 11.2 oz)  Height:        Intake/Output Summary (Last 24 hours) at 11/14/16 0818 Last data filed at 11/14/16 0500  Gross per 24 hour  Intake              450 ml  Output             1100 ml  Net             -650 ml    Exam:  General Well nourished, well developed, no apparent distress, up to chair  HEENT No gross abnormalities. Oropharynx clear. Mallampati III, good dentition.  Pulmonary Prolonged expiratory phase.  Expiratory wheeze bilateral bases.  Cardiovascular Normal rate, regular rhythm. S1, s2. No m/r/g. Distal pulses palpable.  Abdomen Soft, mild diffuse tenderness to palpation, no guarding or rebound, non-distended, positive bowel sounds, no palpable organomegaly or masses. Normoresonant to percussion.  Musculoskeletal Grossly normal  Lymphatics No cervical, supraclavicular or axillary adenopathy.   Neurologic Grossly intact. No focal deficits.   Skin/Integuement No rash, no cyanosis, no clubbing. No edema.         Data Reviewed: I have personally reviewed following labs and imaging studies  Micro Results Recent Results (from the past 240 hour(s))  MRSA PCR Screening     Status: None   Collection Time: 11/08/16  4:20 PM  Result Value Ref Range Status   MRSA by PCR NEGATIVE NEGATIVE Final    Comment:        The GeneXpert MRSA Assay (FDA approved for NASAL specimens only), is one component of a comprehensive MRSA colonization surveillance program. It is not intended to diagnose MRSA infection nor to guide or monitor treatment for MRSA infections.     Radiology Reports Dg Chest 2 View  Result Date: 11/08/2016 CLINICAL DATA:  81 year old female with chest pain and shortness of breath since yesterday. Initial encounter. Personal history of colon cancer. EXAM: CHEST  2 VIEW COMPARISON:  CT Abdomen and Pelvis 05/22/2016. Chest radiographs 05/22/2016 and earlier. FINDINGS: Upright AP and lateral views of the chest. New small bilateral pleural effusions. Stable mild  cardiomegaly. Other mediastinal contours are within normal limits. Visualized tracheal air column is within normal limits. No pneumothorax. No consolidation. Chronic diffuse increased pulmonary interstitium appears not significantly changed. No acute edema suspected. Osteopenia. Calcified aortic atherosclerosis. No acute osseous abnormality identified. Negative visible bowel gas pattern. IMPRESSION: New small bilateral pleural effusions. No other acute cardiopulmonary abnormality. Electronically Signed   By: Genevie Ann M.D.   On: 11/08/2016 10:57   Dg Abd Portable 1v  Result Date: 11/09/2016 CLINICAL DATA:  Impaired nasogastric feeding tube EXAM: PORTABLE ABDOMEN - 1 VIEW COMPARISON:  CT from 05/22/2016, lumbar spine CT 10/14/2016 FINDINGS: The tip and side port of a nasogastric tube is seen in the left upper quadrant in the expected location of the stomach. Scattered gas containing small large bowel loops in a nonobstructive pattern.  Levoscoliosis with lumbar spondylosis of the dorsal spine. Urinary probe projects over the mid pelvis. Mild sclerosis about the sacroiliac joints bilaterally. Patient has sacral insufficiency fractures by CT. IMPRESSION: Gastric tube in the expected location the stomach. No bowel obstruction or free air. Levoconvex curvature of the lumbar spine with spondylosis. Electronically Signed   By: Ashley Royalty M.D.   On: 11/09/2016 23:28   Dg Chest Port 1 View  Result Date: 11/12/2016 CLINICAL DATA:  81 year old female with history of COPD presenting with low oxygen saturations. Congestion. Shortness of breath. Wheezing. EXAM: PORTABLE CHEST 1 VIEW COMPARISON:  Chest x-ray 11/08/2016. FINDINGS: New airspace consolidation in the perihilar aspect of the right lung partially obscuring the right heart border, concerning for developing bronchopneumonia likely involving the right middle lobe. Small bilateral pleural effusions. Cephalization of the pulmonary vasculature, without frank pulmonary edema. Mild cardiomegaly. The patient is rotated to the left on today's exam, resulting in distortion of the mediastinal contours and reduced diagnostic sensitivity and specificity for mediastinal pathology. Atherosclerosis in the thoracic aorta. IMPRESSION: 1. Developing airspace consolidation in the right mid to lower lung, involving at least the right middle lobe, concerning for developing bronchopneumonia. Followup PA and lateral chest X-ray is recommended in 3-4 weeks following trial of antibiotic therapy to ensure resolution and exclude underlying malignancy. 2. Cardiomegaly with pulmonary venous congestion, but no frank pulmonary edema. 3. Small bilateral pleural effusions. 4. Aortic atherosclerosis. Electronically Signed   By: Vinnie Langton M.D.   On: 11/12/2016 12:27     CBC  Recent Labs Lab 11/08/16 0857 11/10/16 0241 11/12/16 0751 11/13/16 0625 11/14/16 0545  WBC 10.7* 6.6 5.2 4.8 5.3  HGB 10.5* 9.4* 9.6* 8.6*  8.7*  HCT 32.2* 28.6* 29.8* 26.6* 27.2*  PLT 290 235 205 204 188  MCV 105.6* 100.0 105.3* 101.1* 101.9*  MCH 34.4* 32.9 33.9 32.7 32.6  MCHC 32.6 32.9 32.2 32.3 32.0  RDW 15.2 15.1 15.4 14.8 14.7  LYMPHSABS 0.7  --   --   --   --   MONOABS 0.7  --   --   --   --   EOSABS 0.0  --   --   --   --   BASOSABS 0.0  --   --   --   --     Chemistries   Recent Labs Lab 11/08/16 0857 11/09/16 0237 11/10/16 0241 11/11/16 0717 11/12/16 2040 11/13/16 0625 11/14/16 0545  NA 144  --  145 143  --  143 139  K 3.5  --  3.0* 3.6  --  3.2* 4.4  CL 107  --  99* 100*  --  97* 98*  CO2 29  --  34* 32  --  34* 33*  GLUCOSE 112*  --  136* 90  --  103* 164*  BUN 28*  --  32* 33*  --  29* 30*  CREATININE 1.94*  --  1.98* 1.83*  --  2.03* 2.07*  CALCIUM 7.8*  --  7.6* 7.7*  --  7.8* 7.5*  MG  --  1.5* 1.5*  --  2.0  --   --   AST  --   --   --   --   --  23 31  ALT  --   --   --   --   --  22 27  ALKPHOS  --   --   --   --   --  43 46  BILITOT  --   --   --   --   --  0.6 0.4   ------------------------------------------------------------------------------------------------------------------ estimated creatinine clearance is 18.3 mL/min (by C-G formula based on SCr of 2.07 mg/dL (H)). ------------------------------------------------------------------------------------------------------------------ No results for input(s): HGBA1C in the last 72 hours. ------------------------------------------------------------------------------------------------------------------ No results for input(s): CHOL, HDL, LDLCALC, TRIG, CHOLHDL, LDLDIRECT in the last 72 hours. ------------------------------------------------------------------------------------------------------------------  Recent Labs  11/12/16 0751  T4TOTAL 2.4*   ------------------------------------------------------------------------------------------------------------------  Recent Labs  11/13/16 1027  VITAMINB12 264  FOLATE 9.7  FERRITIN  116  TIBC 256  IRON 33  RETICCTPCT 1.8    Coagulation profile No results for input(s): INR, PROTIME in the last 168 hours.  No results for input(s): DDIMER in the last 72 hours.  Cardiac Enzymes  Recent Labs Lab 11/08/16 1628 11/08/16 2247 11/12/16 1350  TROPONINI 0.04* 0.05* 0.08*   ------------------------------------------------------------------------------------------------------------------ Invalid input(s): POCBNP   CBG:  Recent Labs Lab 11/09/16 1200 11/10/16 0740 11/10/16 1628 11/11/16 0721 11/12/16 0805  GLUCAP 129* 121* 142* 84 105*       Studies: Dg Chest Port 1 View  Result Date: 11/12/2016 CLINICAL DATA:  81 year old female with history of COPD presenting with low oxygen saturations. Congestion. Shortness of breath. Wheezing. EXAM: PORTABLE CHEST 1 VIEW COMPARISON:  Chest x-ray 11/08/2016. FINDINGS: New airspace consolidation in the perihilar aspect of the right lung partially obscuring the right heart border, concerning for developing bronchopneumonia likely involving the right middle lobe. Small bilateral pleural effusions. Cephalization of the pulmonary vasculature, without frank pulmonary edema. Mild cardiomegaly. The patient is rotated to the left on today's exam, resulting in distortion of the mediastinal contours and reduced diagnostic sensitivity and specificity for mediastinal pathology. Atherosclerosis in the thoracic aorta. IMPRESSION: 1. Developing airspace consolidation in the right mid to lower lung, involving at least the right middle lobe, concerning for developing bronchopneumonia. Followup PA and lateral chest X-ray is recommended in 3-4 weeks following trial of antibiotic therapy to ensure resolution and exclude underlying malignancy. 2. Cardiomegaly with pulmonary venous congestion, but no frank pulmonary edema. 3. Small bilateral pleural effusions. 4. Aortic atherosclerosis. Electronically Signed   By: Vinnie Langton M.D.   On: 11/12/2016  12:27      No results found for: HGBA1C Lab Results  Component Value Date   LDLCALC 171 (H) 07/05/2016   CREATININE 2.07 (H) 11/14/2016       Scheduled Meds: . amLODipine  10 mg Oral Daily  . atorvastatin  40 mg Oral q1800  . ceFEPime (MAXIPIME) IV  1 g Intravenous Q24H  . heparin subcutaneous  5,000 Units Subcutaneous Q8H  . hydrALAZINE  12.5 mg Oral Q8H  . ipratropium  0.5 mg Nebulization Q6H  . isosorbide mononitrate  30 mg Oral Daily  . levalbuterol  0.63 mg Nebulization Q6H  . levothyroxine  125 mcg Oral QAC breakfast  . magnesium oxide  400 mg Oral BID  . mexiletine  200 mg Oral Q12H  . pantoprazole  40 mg Oral Daily  . predniSONE  40 mg Oral Q breakfast  . vancomycin  1,000 mg Intravenous Q48H   Continuous Infusions:   LOS: 6 days    Time spent: >30 MINS    [facility]  [facility] Pager [phone]. If 7PM-7AM, please contact night-coverage at [email or web address], password [facility] 11/14/2016, 8:18 AM  LOS: 6 days

## 2016-11-14 NOTE — Plan of Care (Signed)
Problem: Safety: Goal: Ability to remain free from injury will improve Outcome: Progressing RN instructed patient to call and wait for assistance prior to getting out of bed.  Patient stated understanding.  Safe environment being provided per staff.

## 2016-11-14 NOTE — Plan of Care (Signed)
Problem: Education: Goal: Knowledge of [facility] Education information/materials will improve Outcome: Progressing Patient aware of plan of care.  RN provided medication education on all medications administered thus far this shift.  Patient stated understanding.     

## 2016-11-14 NOTE — Progress Notes (Signed)
Patient Name: Meghan Anderson Date of Encounter: 11/14/2016  Primary Cardiologist: Dr. Ladonna Snide Problem List     Principal Problem:   Acute respiratory failure with hypoxia [facility]) Active Problems:   Essential hypertension, benign   Hyperlipidemia   Hypothyroidism   Chronic renal disease, stage 3, moderately decreased glomerular filtration rate (GFR) between 30-59 mL/min/1.73 square meter   COPD exacerbation (HCC)   Troponin level elevated   Difficulty in walking, not elsewhere classified   SOB (shortness of breath)   Pure hypercholesterolemia   Cardiomyopathy ([place])     Subjective   Says she feels ok this morning. Denies chest pain. In good spirits.   Inpatient Medications    Scheduled Meds: . amLODipine  10 mg Oral Daily  . atorvastatin  40 mg Oral q1800  . ceFEPime (MAXIPIME) IV  1 g Intravenous Q24H  . fluticasone furoate-vilanterol  1 puff Inhalation Daily  . heparin subcutaneous  5,000 Units Subcutaneous Q8H  . hydrALAZINE  12.5 mg Oral Q8H  . isosorbide mononitrate  30 mg Oral Daily  . levalbuterol  0.63 mg Nebulization Q6H  . levothyroxine  125 mcg Oral QAC breakfast  . magnesium oxide  400 mg Oral BID  . mexiletine  200 mg Oral Q12H  . pantoprazole  40 mg Oral Daily  . predniSONE  40 mg Oral Q breakfast  . tiotropium  18 mcg Inhalation Daily  . vancomycin  1,000 mg Intravenous Q48H   Continuous Infusions:  PRN Meds: acetaminophen **OR** acetaminophen, bisacodyl, guaiFENesin-dextromethorphan, hydrALAZINE, levalbuterol, ondansetron **OR** ondansetron (ZOFRAN) IV, polyethylene glycol, temazepam   Vital Signs    Vitals:   11/13/16 2057 11/13/16 2115 11/14/16 0203 11/14/16 0633  BP:      Pulse:      Resp:      Temp:  98.8 F (37.1 C)  98.2 F (36.8 C)  TempSrc:  Oral  Oral  SpO2: 99%  96%   Weight:    126 lb 11.2 oz (57.5 kg)  Height:        Intake/Output Summary (Last 24 hours) at 11/14/16 0952 Last data filed at 11/14/16 0838  Gross per  24 hour  Intake              650 ml  Output             1100 ml  Net             -450 ml   Filed Weights   11/12/16 0500 11/13/16 0500 11/14/16 0633  Weight: 136 lb 0.4 oz (61.7 kg) 126 lb 9.6 oz (57.4 kg) 126 lb 11.2 oz (57.5 kg)    Physical Exam   GEN: Ill appearing female in no acute distress.  HEENT: Grossly normal.  Neck: Supple, no JVD, carotid bruits, or masses. Cardiac: RRR, no murmurs, rubs, or gallops. No clubbing, cyanosis, edema.  Radials/DP/PT 2+ and equal bilaterally.  Respiratory:  Respirations regular and unlabored, clear to auscultation bilaterally. GI: Soft, nontender, nondistended, BS + x 4. MS: no deformity or atrophy. Skin: warm and dry, no rash. Neuro:  Strength and sensation are intact. Psych: AAOx3.  Normal affect.  Labs    CBC  Recent Labs  11/13/16 0625 11/14/16 0545  WBC 4.8 5.3  HGB 8.6* 8.7*  HCT 26.6* 27.2*  MCV 101.1* 101.9*  PLT 204 0000000   Basic Metabolic Panel  Recent Labs  11/12/16 0751 11/12/16 2040 11/13/16 0625 11/14/16 0545  NA  --   --  143 139  K  --   --  3.2* 4.4  CL  --   --  97* 98*  CO2  --   --  34* 33*  GLUCOSE  --   --  103* 164*  BUN  --   --  29* 30*  CREATININE  --   --  2.03* 2.07*  CALCIUM  --   --  7.8* 7.5*  MG  --  2.0  --   --   PHOS 3.0  --   --   --    Liver Function Tests  Recent Labs  11/13/16 0625 11/14/16 0545  AST 23 31  ALT 22 27  ALKPHOS 43 46  BILITOT 0.6 0.4  PROT 5.6* 5.9*  ALBUMIN 2.9* 2.8*   Cardiac Enzymes  Recent Labs  11/12/16 1350  TROPONINI 0.08*   Thyroid Function Tests  Recent Labs  11/12/16 0751  T4TOTAL 2.4*    Telemetry    NSR- Personally Reviewed  Radiology    Dg Chest Port 1 View  Result Date: 11/12/2016 CLINICAL DATA:  81 year old female with history of COPD presenting with low oxygen saturations. Congestion. Shortness of breath. Wheezing. EXAM: PORTABLE CHEST 1 VIEW COMPARISON:  Chest x-ray 11/08/2016. FINDINGS: New airspace consolidation in  the perihilar aspect of the right lung partially obscuring the right heart border, concerning for developing bronchopneumonia likely involving the right middle lobe. Small bilateral pleural effusions. Cephalization of the pulmonary vasculature, without frank pulmonary edema. Mild cardiomegaly. The patient is rotated to the left on today's exam, resulting in distortion of the mediastinal contours and reduced diagnostic sensitivity and specificity for mediastinal pathology. Atherosclerosis in the thoracic aorta. IMPRESSION: 1. Developing airspace consolidation in the right mid to lower lung, involving at least the right middle lobe, concerning for developing bronchopneumonia. Followup PA and lateral chest X-ray is recommended in 3-4 weeks following trial of antibiotic therapy to ensure resolution and exclude underlying malignancy. 2. Cardiomegaly with pulmonary venous congestion, but no frank pulmonary edema. 3. Small bilateral pleural effusions. 4. Aortic atherosclerosis. Electronically Signed   By: Vinnie Langton M.D.   On: 11/12/2016 12:27    Cardiac Studies   Transthoracic Echocardiography 11/11/16 Study Conclusions  - Left ventricle: The cavity size was normal. Wall thickness was increased increased in a pattern of mild to moderate LVH. Systolic function was moderately to severely reduced. The estimated ejection fraction was in the range of 30% to 35%. Wall motion was normal; there were no regional wall motion abnormalities. - Mitral valve: There was moderate to severe regurgitation. - Left atrium: The atrium was mildly to moderately dilated. - Pulmonary arteries: Systolic pressure was mildly increased. PA peak pressure: 34 mm Hg (S).   Patient Profile     Meghan Anderson is a 81 year old female with a past medical history of COPD, CKD, IIA (T3N0M0) rectal adenocarcinoma, S/P resection of rectal mass by Dr. Marcello Moores on 12/14/2015, now S/P concomitant chemoradiation, HLD recently  transferred from [facility] after presenting there for chest pain and shortness of breath     Assessment & Plan    1. NSVT:Much improved today. Started Mexiletine on 11/10/16.  2. Chest pain: has elevated troponin to 0.05. Her family tells me that she complained of chest pain yesterday and today, described it as substernal pressure. Troponin at 0.05. Will need heart cath, Echo shows reduced EF of 30-35%. EF was 50-55% in 2016.    Creatinine is 2.07 today.   3. COPD: continued wheezing,  per pulmonary.   4. Acute on chronic kidney disease: Creatinine is 2.07 today. Lasix discontinued yesterday.   5. Lethargy: Resolved   6. Acute on chronic systolic CHF: Hydralazine started yesterday, BP stable, hypertensive at times. Will increase to 25mg  po q 8 hours. BNP elevated. Now with cardiorenal syndrome. Further MD recommendations to follow.   Will need to get renal involved most likely.   Signed, Arbutus Leas, NP  11/14/2016, 9:52 AM    Patient seen and examined. Agree with assessment and plan. No chest pain. I/O -2130. Cr increased to 2.07 today. Feels better. BP 140/90; will further titrate hydralazine to 25 mg q 8 hrs. Will not be ready for cath by tomorrow with Cr 2.07 today, and should defer to next week in renal fxn improves.     Troy Sine, MD, [facility] 11/14/2016 1:08 PM

## 2016-11-15 DIAGNOSIS — I255 Ischemic cardiomyopathy: Secondary | ICD-10-CM

## 2016-11-15 DIAGNOSIS — N183 Chronic kidney disease, stage 3 (moderate): Secondary | ICD-10-CM

## 2016-11-15 LAB — COMPREHENSIVE METABOLIC PANEL
ALBUMIN: 2.7 g/dL — AB (ref 3.5–5.0)
ALT: 24 U/L (ref 14–54)
ANION GAP: 9 (ref 5–15)
AST: 30 U/L (ref 15–41)
Alkaline Phosphatase: 43 U/L (ref 38–126)
BUN: 32 mg/dL — AB (ref 6–20)
CHLORIDE: 98 mmol/L — AB (ref 101–111)
CO2: 32 mmol/L (ref 22–32)
Calcium: 7.5 mg/dL — ABNORMAL LOW (ref 8.9–10.3)
Creatinine, Ser: 1.96 mg/dL — ABNORMAL HIGH (ref 0.44–1.00)
GFR calc Af Amer: 27 mL/min — ABNORMAL LOW (ref >60)
GFR, EST NON AFRICAN AMERICAN: 23 mL/min — AB (ref >60)
Glucose, Bld: 133 mg/dL — ABNORMAL HIGH (ref 65–99)
POTASSIUM: 4.1 mmol/L (ref 3.5–5.1)
Sodium: 139 mmol/L (ref 135–145)
Total Bilirubin: 0.5 mg/dL (ref 0.3–1.2)
Total Protein: 5.4 g/dL — ABNORMAL LOW (ref 6.5–8.1)

## 2016-11-15 LAB — GLUCOSE, CAPILLARY: GLUCOSE-CAPILLARY: 109 mg/dL — AB (ref 65–99)

## 2016-11-15 MED ORDER — ISOSORBIDE MONONITRATE ER 60 MG PO TB24
60.0000 mg | ORAL_TABLET | Freq: Every day | ORAL | Status: DC
  Administered 2016-11-16 – 2016-11-20 (×5): 60 mg via ORAL
  Filled 2016-11-15 (×5): qty 1

## 2016-11-15 MED ORDER — LEVALBUTEROL HCL 0.63 MG/3ML IN NEBU
0.6300 mg | INHALATION_SOLUTION | Freq: Two times a day (BID) | RESPIRATORY_TRACT | Status: DC
  Administered 2016-11-15 – 2016-11-20 (×10): 0.63 mg via RESPIRATORY_TRACT
  Filled 2016-11-15 (×10): qty 3

## 2016-11-15 MED ORDER — HYDRALAZINE HCL 25 MG PO TABS
37.5000 mg | ORAL_TABLET | Freq: Three times a day (TID) | ORAL | Status: DC
  Administered 2016-11-15 – 2016-11-19 (×12): 37.5 mg via ORAL
  Filled 2016-11-15 (×12): qty 2

## 2016-11-15 NOTE — Care Management (Addendum)
Case Management Note Initial Note Started By: Jasmine Pang RN Case Management Patient Details  Name: Meghan Anderson MRN: [id number] Date of Birth: 03/10/1936  Subjective/Objective:     CM following for progression and d/c planning.                Action/Plan: 11/12/2016 Noted CM orders for [facility] services. Attempted to visit pt re [place] needs, however pt not as stable today and requiring EKG to be obtained at that time. CM will continue to follow and arrange [facility] services if appropriate as the pt status improves. Await further evaluation and medical stability.   Expected Discharge Date:                         Expected Discharge Plan: Home with Home Health Services    In-House Referral:  N/A   Discharge planning Services  CM Consult  Post Acute Care Choice: Durable Medical Equipment, Home health  Choice offered to: Patient, Adult Children    DME Arranged:   Oxygen DME Agency:  [facility]  HH Arranged: RN, PT, Nurses Aide    [facility] Agency:  [place]  Status of Service:  Completed.   If discussed at [place] of Stay Meetings, dates discussed:  11-19-16  Additional Comments: 11-20-16 [place], RN, BSN [phone] Pt did qualify for home 02. [facility] did deliver. While in room son asked about hospital bed. CM did text page MD Abrol in regards to placeng order. Once order received [facility] to deliver the bed to patient's home. Per son pt will not need to stay until bed is delivered. Son to transport patient home. No further needs from CM at this time.    11-19-16 1215 Jacqlyn Krauss, RN,BSN [phone] CM did speak with pt and daughter in regards to disposition needs and DME. Agency List had been provided to pt/family and they chose [facility]. CM did make referral with Butch Penny with [facility] and SOC to begin within 24-48 hours post d/c. DME 02 to be ordered. RN to place pulmonary note and MD to place DME 02 order. No further needs from CM at this time. DME to be  delivered to room. Plan for d/c 11-20-16.   1023 11-18-16 Jacqlyn Krauss, RN, BSN [phone] CM did speak with pt and family in regards to disposition needs. Plan for cath this am. Family had not yet decided on agency. CM will continue to monitor for additional needs.   [id number] 11-15-16 Jacqlyn Krauss, RN,BSN [phone] CM did speak with pt and daughter in regards to disposition needs. Pt wanted to have some time to decide on an agency. Agency list provided for patient and family to make decision. CM will continue to monitor. Awaiting cath due to increased Cr.

## 2016-11-15 NOTE — Care Management Important Message (Signed)
Important Message  Patient Details  Name: Meghan Anderson MRN: [id number] Date of Birth: September 05, 1936   Medicare Important Message Given:  Yes    Nathen May 11/15/2016, 1:44 PM

## 2016-11-15 NOTE — Progress Notes (Signed)
Nutrition Follow-up  DOCUMENTATION CODES:   Not applicable  INTERVENTION:    Ensure Enlive po once daily, each supplement provides 350 kcal and 20 grams of protein  NUTRITION DIAGNOSIS:   Increased nutrient needs related to catabolic illness (COPD) as evidenced by estimated needs.  Ongoing  GOAL:   Patient will meet greater than or equal to 90% of their needs  Progressing  MONITOR:   PO intake, Labs, Weight trends, I & O's  ASSESSMENT:   27F w/ PMH significant for COPD, HTN, HLD, GERD, hypothyroidism who presents as a transfer from [facility] for ventricular ectopy, increased work of breathing and finding suggestive of a newly identified cardiomyopathy. CXR notable for small bilateral pleural effusions.  Nutrition-Focused physical exam completed. Findings are no fat depletion, mild muscle depletion, and no edema.  Patient reports improved intake. She has been consuming 50-75% of meals. Agreed to drink Ensure once daily to maximize protein and calorie intake.  Diet Order:  Diet Heart Room service appropriate? Yes; Fluid consistency: Thin  Skin:  Reviewed, no issues  Last BM:  11/06/2016  Height:   Ht Readings from Last 1 Encounters:  11/09/16 5\' 1"  (1.549 m)    Weight:   Wt Readings from Last 1 Encounters:  11/15/16 127 lb 4.8 oz (57.7 kg)    Ideal Body Weight:  47.7 kg  BMI:  Body mass index is 24.05 kg/m.  Estimated Nutritional Needs:   Kcal:  1235-1435 (MSJ x 1.2-1.4)  Protein:  70-85 grams (1.2-1.4 grams/kg)  Fluid:  >/= 1.5 L/day (25 ml/kg)  EDUCATION NEEDS:   No education needs identified at this time  Molli Barrows, [place], [place], [place] Pager [phone] After Hours Pager [phone]

## 2016-11-15 NOTE — Progress Notes (Signed)
[facility] PROGRESS NOTE  Meghan Anderson [id number] DOB: Jan 16, 1936 DOA: 11/08/2016   PCP: Fransisca Kaufmann Detting[place], MD     Assessment/Plan: Principal Problem:   Acute respiratory failure with hypoxia ([place]) Active Problems:   Essential hypertension, benign   Hyperlipidemia   Hypothyroidism   Chronic renal disease, stage 3, moderately decreased glomerular filtration rate (GFR) between 30-59 mL/min/1.73 square meter   COPD exacerbation (HCC)   Troponin level elevated   Difficulty in walking, not elsewhere classified   SOB (shortness of breath)   Pure hypercholesterolemia   Cardiomyopathy ([place])   Ms. Tilden is a 81F with PMH significant for COPD, CKD III, GERD, HLD, HTN, prior rectal adenoca and hypothyroidism. She was admitted to [facility] on 12/29 with complaints of worsening shortness of breath x 1 day. She was dyspneic both with exertion and at rest. She denied associated cough, sputum, fever, chills, chest pain, palpitations. On presentation she was noted to be hypoxemic. She had a mild troponin leak at 0.05.  Within a few hours, she had increasing shortness of breath and wheezing, persistent hypertension and telemetry reported arrhythmias (bigeminy, trigeminy, brief runs of V tach). She was transferred to ICU. She was seen by cardiology with bedside echo showing EF ~30%. She was started on lidocaine, given iv lasix and transfer to [name] was recommended. She was also started on BiPAP for work of breathing.   She has been short of breath for several days (since 12/26), but had an acute worsening the morning of 12/29. She had gone to her pulmonologist several days ago to get clearance for back surgery and that physician felt that she was acutely exacerbated and gave her a kenalog shot, prednisone and an antibiotic shot. Despite those interventions, she failed to improve. She is usually maintained on Breo, Spiriva and prn nebs.  Patient is now admitted for acute on chronic  hypoxic respiratory failure again related to CHF and COPD exacerbation  Assessment and plan Acute on chronic hypoxemic respiratory failure, acute COPD exacerbation, now HCAP Multifactorial secondary to newly identified cardiomyopathy Continue steroids, resume Breo, Spiriva Continue supplemental oxygen Continue nebulizer treatments CXR 1/2 shows Developing airspace consolidation in the right mid to lower lung, involving at least the right middle lobe, concerning for developing Bronchopneumonia. Received vancomycin/cefepime for HCAP 11/12/16-11/14/16. Blood cultures no growth so far Discontinue vancomycin 11/15/16 Speech therapy evaluation recommends regular diet and thin liquids   Ventricular ectopy/Acute on chronic systolic CHF  [TTE in [id number] w/ EF [id number], diastolic dysfunction] Now LV EF: 30% -   35%  significantly less ventricular ectopy after starting lidocaine.  Switched to Mexitil  Repleted magnesium Diuretics managed by cardiology Cr stable. Cardiology held furosemide. With EF 30 - 35% cardiology increased  hydralazine at 37.5 q 8 hrs and continue nitrates  . Hold off on ACE inhibitor Timing of cardiac catheterization to be determined by cardiology   Chest pain: has elevated troponin to 0.05. Will need ischemic workup, ideally she will need cardiac catheterization.  Acute on chronic kidney disease-baseline around 1.6-1.8, renal function trending up secondary to diuresis Monitor renal function if she needs cardiac cath. Hold off on nephrotoxic medications   Recent rectal adenoca s/p 5FU then Xeloda, currently off treatment  Hypothyroidism Markedly elevated TSH, free T4 low. Dose of Synthroid increased to 125 g a day   Essential hypertension Continue Norvasc 10 mg daily  Now on hydralazine, Imdur,  Anemia of chronic disease/microcytic anemia - Baseline Hemoglobin 10.5 -  Monitor CBC daily , check an anemia panel  Dyslipidemia - Continue Lipitor     Hypokalemia-replete      DVT prophylaxsis heparin  Code Status:  Full code     Family Communication: Discussed in detail with the patient/son/daughter, all imaging results, lab results explained to the patient   Disposition Plan:  Continue telemetry. Pending decision about cardiac cath, early next week?      Consultants:  Cardiology  Critical care  Procedures:  None  Antibiotics: Anti-infectives    Start     Dose/Rate Route Frequency Ordered Stop   11/12/16 1500  ceFEPIme (MAXIPIME) 1 g in dextrose 5 % 50 mL IVPB     1 g 100 mL/hr over 30 Minutes Intravenous Every 24 hours 11/12/16 1352     11/12/16 1500  vancomycin (VANCOCIN) IVPB 1000 mg/200 mL premix     1,000 mg 200 mL/hr over 60 Minutes Intravenous Every 48 hours 11/12/16 1352           HPI/Subjective: Much more awake alert and oriented today. Denies any chest pain or shortness of breath  Objective: Vitals:   11/14/16 2049 11/14/16 2135 11/15/16 0601 11/15/16 0835  BP:  125/74 (!) 157/90   Pulse:  70 83   Resp:  18 20   Temp:   97.6 F (36.4 C)   TempSrc:   Oral   SpO2: 98% 97% 96% 93%  Weight:   57.7 kg (127 lb 4.8 oz)   Height:        Intake/Output Summary (Last 24 hours) at 11/15/16 0853 Last data filed at 11/15/16 0850  Gross per 24 hour  Intake              780 ml  Output              150 ml  Net              630 ml    Exam:  General Well nourished, well developed, no apparent distress, up to chair  HEENT No gross abnormalities. Oropharynx clear. Mallampati III, good dentition.  Pulmonary Prolonged expiratory phase.  Expiratory wheeze bilateral bases.  Cardiovascular Normal rate, regular rhythm. S1, s2. No m/r/g. Distal pulses palpable.  Abdomen Soft, mild diffuse tenderness to palpation, no guarding or rebound, non-distended, positive bowel sounds, no palpable organomegaly or masses. Normoresonant to percussion.  Musculoskeletal Grossly normal  Lymphatics No cervical,  supraclavicular or axillary adenopathy.   Neurologic Grossly intact. No focal deficits.   Skin/Integuement No rash, no cyanosis, no clubbing. No edema.        Data Reviewed: I have personally reviewed following labs and imaging studies  Micro Results Recent Results (from the past 240 hour(s))  MRSA PCR Screening     Status: None   Collection Time: 11/08/16  4:20 PM  Result Value Ref Range Status   MRSA by PCR NEGATIVE NEGATIVE Final    Comment:        The GeneXpert MRSA Assay (FDA approved for NASAL specimens only), is one component of a comprehensive MRSA colonization surveillance program. It is not intended to diagnose MRSA infection nor to guide or monitor treatment for MRSA infections.   Culture, blood (routine x 2)     Status: None (Preliminary result)   Collection Time: 11/13/16 10:27 AM  Result Value Ref Range Status   Specimen Description BLOOD LEFT ANTECUBITAL  Final   Special Requests BOTTLES DRAWN AEROBIC AND ANAEROBIC  [place]  Final   Culture NO  GROWTH 1 DAY  Final   Report Status PENDING  Incomplete  Culture, blood (routine x 2)     Status: None (Preliminary result)   Collection Time: 11/13/16 10:40 AM  Result Value Ref Range Status   Specimen Description BLOOD LEFT ANTECUBITAL  Final   Special Requests BOTTLES DRAWN AEROBIC AND ANAEROBIC  5CC  Final   Culture NO GROWTH 1 DAY  Final   Report Status PENDING  Incomplete    Radiology Reports Dg Chest 2 View  Result Date: 11/08/2016 CLINICAL DATA:  81 year old female with chest pain and shortness of breath since yesterday. Initial encounter. Personal history of colon cancer. EXAM: CHEST  2 VIEW COMPARISON:  CT Abdomen and Pelvis 05/22/2016. Chest radiographs 05/22/2016 and earlier. FINDINGS: Upright AP and lateral views of the chest. New small bilateral pleural effusions. Stable mild cardiomegaly. Other mediastinal contours are within normal limits. Visualized tracheal air column is within normal limits. No  pneumothorax. No consolidation. Chronic diffuse increased pulmonary interstitium appears not significantly changed. No acute edema suspected. Osteopenia. Calcified aortic atherosclerosis. No acute osseous abnormality identified. Negative visible bowel gas pattern. IMPRESSION: New small bilateral pleural effusions. No other acute cardiopulmonary abnormality. Electronically Signed   By: Genevie Ann M.D.   On: 11/08/2016 10:57   Dg Abd Portable 1v  Result Date: 11/09/2016 CLINICAL DATA:  Impaired nasogastric feeding tube EXAM: PORTABLE ABDOMEN - 1 VIEW COMPARISON:  CT from 05/22/2016, lumbar spine CT 10/14/2016 FINDINGS: The tip and side port of a nasogastric tube is seen in the left upper quadrant in the expected location of the stomach. Scattered gas containing small large bowel loops in a nonobstructive pattern. Levoscoliosis with lumbar spondylosis of the dorsal spine. Urinary probe projects over the mid pelvis. Mild sclerosis about the sacroiliac joints bilaterally. Patient has sacral insufficiency fractures by CT. IMPRESSION: Gastric tube in the expected location the stomach. No bowel obstruction or free air. Levoconvex curvature of the lumbar spine with spondylosis. Electronically Signed   By: Ashley Royalty M.D.   On: 11/09/2016 23:28   Dg Chest Port 1 View  Result Date: 11/12/2016 CLINICAL DATA:  81 year old female with history of COPD presenting with low oxygen saturations. Congestion. Shortness of breath. Wheezing. EXAM: PORTABLE CHEST 1 VIEW COMPARISON:  Chest x-ray 11/08/2016. FINDINGS: New airspace consolidation in the perihilar aspect of the right lung partially obscuring the right heart border, concerning for developing bronchopneumonia likely involving the right middle lobe. Small bilateral pleural effusions. Cephalization of the pulmonary vasculature, without frank pulmonary edema. Mild cardiomegaly. The patient is rotated to the left on today's exam, resulting in distortion of the mediastinal  contours and reduced diagnostic sensitivity and specificity for mediastinal pathology. Atherosclerosis in the thoracic aorta. IMPRESSION: 1. Developing airspace consolidation in the right mid to lower lung, involving at least the right middle lobe, concerning for developing bronchopneumonia. Followup PA and lateral chest X-ray is recommended in 3-4 weeks following trial of antibiotic therapy to ensure resolution and exclude underlying malignancy. 2. Cardiomegaly with pulmonary venous congestion, but no frank pulmonary edema. 3. Small bilateral pleural effusions. 4. Aortic atherosclerosis. Electronically Signed   By: Vinnie Langton M.D.   On: 11/12/2016 12:27     CBC  Recent Labs Lab 11/08/16 0857 11/10/16 0241 11/12/16 0751 11/13/16 0625 11/14/16 0545  WBC 10.7* 6.6 5.2 4.8 5.3  HGB 10.5* 9.4* 9.6* 8.6* 8.7*  HCT 32.2* 28.6* 29.8* 26.6* 27.2*  PLT 290 235 205 204 188  MCV 105.6* 100.0 105.3* 101.1* 101.9[facility]  34.4* 32.9 33.9 32.7 32.6  MCHC 32.6 32.9 32.2 32.3 32.0  RDW 15.2 15.1 15.4 14.8 14.7  LYMPHSABS 0.7  --   --   --   --   MONOABS 0.7  --   --   --   --   EOSABS 0.0  --   --   --   --   BASOSABS 0.0  --   --   --   --     Chemistries   Recent Labs Lab 11/09/16 0237 11/10/16 0241 11/11/16 0717 11/12/16 2040 11/13/16 0625 11/14/16 0545 11/15/16 0532  NA  --  145 143  --  143 139 139  K  --  3.0* 3.6  --  3.2* 4.4 4.1  CL  --  99* 100*  --  97* 98* 98*  CO2  --  34* 32  --  34* 33* 32  GLUCOSE  --  136* 90  --  103* 164* 133*  BUN  --  32* 33*  --  29* 30* 32*  CREATININE  --  1.98* 1.83*  --  2.03* 2.07* 1.96*  CALCIUM  --  7.6* 7.7*  --  7.8* 7.5* 7.5*  MG 1.5* 1.5*  --  2.0  --   --   --   AST  --   --   --   --  23 31 30   ALT  --   --   --   --  22 27 24   ALKPHOS  --   --   --   --  43 46 43  BILITOT  --   --   --   --  0.6 0.4 0.5    ------------------------------------------------------------------------------------------------------------------ estimated creatinine clearance is 19.3 mL/min (by C-G formula based on SCr of 1.96 mg/dL (H)). ------------------------------------------------------------------------------------------------------------------ No results for input(s): HGBA1C in the last 72 hours. ------------------------------------------------------------------------------------------------------------------ No results for input(s): CHOL, HDL, LDLCALC, TRIG, CHOLHDL, LDLDIRECT in the last 72 hours. ------------------------------------------------------------------------------------------------------------------ No results for input(s): TSH, T4TOTAL, T3FREE, THYROIDAB in the last 72 hours.  Invalid input(s): FREET3 ------------------------------------------------------------------------------------------------------------------  Recent Labs  11/13/16 1027  VITAMINB12 264  FOLATE 9.7  FERRITIN 116  TIBC 256  IRON 33  RETICCTPCT 1.8    Coagulation profile No results for input(s): INR, PROTIME in the last 168 hours.  No results for input(s): DDIMER in the last 72 hours.  Cardiac Enzymes  Recent Labs Lab 11/08/16 1628 11/08/16 2247 11/12/16 1350  TROPONINI 0.04* 0.05* 0.08*   ------------------------------------------------------------------------------------------------------------------ Invalid input(s): POCBNP   CBG:  Recent Labs Lab 11/10/16 0740 11/10/16 1628 11/11/16 0721 11/12/16 0805 11/15/16 0746  GLUCAP 121* 142* 84 105* 109*       Studies: No results found.    No results found for: HGBA1C Lab Results  Component Value Date   LDLCALC 171 (H) 07/05/2016   CREATININE 1.96 (H) 11/15/2016       Scheduled Meds: . amLODipine  10 mg Oral Daily  . atorvastatin  40 mg Oral q1800  . ceFEPime (MAXIPIME) IV  1 g Intravenous Q24H  . fluticasone furoate-vilanterol  1  puff Inhalation Daily  . heparin subcutaneous  5,000 Units Subcutaneous Q8H  . hydrALAZINE  25 mg Oral Q8H  . isosorbide mononitrate  30 mg Oral Daily  . levalbuterol  0.63 mg Nebulization QID  . levothyroxine  125 mcg Oral QAC breakfast  . magnesium oxide  400 mg Oral BID  . mexiletine  200 mg Oral Q12H  . pantoprazole  40  mg Oral Daily  . predniSONE  40 mg Oral Q breakfast  . tiotropium  18 mcg Inhalation Daily  . vancomycin  1,000 mg Intravenous Q48H   Continuous Infusions:   LOS: 7 days    Time spent: >30 MINS    [facility]  [facility] Pager [phone]. If 7PM-7AM, please contact night-coverage at [email or web address], password [facility] 11/15/2016, 8:53 AM  LOS: 7 days

## 2016-11-15 NOTE — Progress Notes (Signed)
Pharmacy Antibiotic Note  Meghan Anderson is a 81 y.o. female admitted on 11/08/2016 with R/O sepsis.  Pharmacy has been consulted for Cefepime dosing.  Previously on Vancomycin as well, which was stopped this AM.  Pt is afebrile, WBC wnl.  Cr remains elevated & essentially unchanging.  Cultures are ntd.  Plan: Continue Cefepime 1g IV q24 Pharmacy will sign-off, please reconsult as needed  Height: 5\' 1"  (154.9 cm) Weight: 127 lb 4.8 oz (57.7 kg) IBW/kg (Calculated) : 47.8  Temp (24hrs), Avg:98.2 F (36.8 C), Min:97.6 F (36.4 C), Max:98.6 F (37 C)   Recent Labs Lab 11/10/16 0241 11/11/16 0717 11/12/16 0751 11/13/16 0625 11/14/16 0545 11/15/16 0532  WBC 6.6  --  5.2 4.8 5.3  --   CREATININE 1.98* 1.83*  --  2.03* 2.07* 1.96*    Estimated Creatinine Clearance: 19.3 mL/min (by C-G formula based on SCr of 1.96 mg/dL (H)).    Allergies  Allergen Reactions  . Levofloxacin Other (See Comments)    Pt states that this medication was too strong and she could not take them.    . Moxifloxacin Rash  . Prednisone Itching and Rash  . Quinolones Rash    Antimicrobials this admission:  Vancomycin 1/2>1/5 Cefepime 1/2>  Microbiology results:  1/3 BCx:  ntd 12/29 MRSA PCR: neg  Thank you for allowing pharmacy to be a part of this patient's care.  Gracy Bruins, PharmD Clinical Pharmacist [facility]

## 2016-11-15 NOTE — Progress Notes (Signed)
Patient Name: Meghan Anderson Date of Encounter: 11/15/2016  Primary Cardiologist: Dr. Ladonna Snide Problem List     Principal Problem:   Acute respiratory failure with hypoxia [facility]) Active Problems:   Essential hypertension, benign   Hyperlipidemia   Hypothyroidism   Chronic renal disease, stage 3, moderately decreased glomerular filtration rate (GFR) between 30-59 mL/min/1.73 square meter   COPD exacerbation (HCC)   Troponin level elevated   Difficulty in walking, not elsewhere classified   SOB (shortness of breath)   Pure hypercholesterolemia   Cardiomyopathy ([place])     Subjective   Feels well this am. No chest pain or SOB.   Inpatient Medications    Scheduled Meds: . amLODipine  10 mg Oral Daily  . atorvastatin  40 mg Oral q1800  . ceFEPime (MAXIPIME) IV  1 g Intravenous Q24H  . fluticasone furoate-vilanterol  1 puff Inhalation Daily  . heparin subcutaneous  5,000 Units Subcutaneous Q8H  . hydrALAZINE  25 mg Oral Q8H  . isosorbide mononitrate  30 mg Oral Daily  . levalbuterol  0.63 mg Nebulization QID  . levothyroxine  125 mcg Oral QAC breakfast  . magnesium oxide  400 mg Oral BID  . mexiletine  200 mg Oral Q12H  . pantoprazole  40 mg Oral Daily  . predniSONE  40 mg Oral Q breakfast  . tiotropium  18 mcg Inhalation Daily   Continuous Infusions:  PRN Meds: acetaminophen **OR** acetaminophen, bisacodyl, guaiFENesin-dextromethorphan, hydrALAZINE, levalbuterol, ondansetron **OR** ondansetron (ZOFRAN) IV, polyethylene glycol, temazepam   Vital Signs    Vitals:   11/14/16 2049 11/14/16 2135 11/15/16 0601 11/15/16 0835  BP:  125/74 (!) 157/90   Pulse:  70 83   Resp:  18 20   Temp:   97.6 F (36.4 C)   TempSrc:   Oral   SpO2: 98% 97% 96% 93%  Weight:   127 lb 4.8 oz (57.7 kg)   Height:        Intake/Output Summary (Last 24 hours) at 11/15/16 1119 Last data filed at 11/15/16 0850  Gross per 24 hour  Intake              780 ml  Output              150  ml  Net              630 ml   Filed Weights   11/13/16 0500 11/14/16 0633 11/15/16 0601  Weight: 126 lb 9.6 oz (57.4 kg) 126 lb 11.2 oz (57.5 kg) 127 lb 4.8 oz (57.7 kg)    Physical Exam    GEN: Ill appearing female in no acute distress.  HEENT: Grossly normal.  Neck: Supple, no JVD, carotid bruits, or masses. Cardiac: RRR, no murmurs, rubs, or gallops. No clubbing, cyanosis, edema.  Radials/DP/PT 2+ and equal bilaterally.  Respiratory:  Respirations regular and unlabored, clear to auscultation bilaterally. GI: Soft, nontender, nondistended, BS + x 4. MS: no deformity or atrophy. Skin: warm and dry, no rash. Neuro:  Strength and sensation are intact. Psych: AAOx3.  Normal affect.  Labs    CBC  Recent Labs  11/13/16 0625 11/14/16 0545  WBC 4.8 5.3  HGB 8.6* 8.7*  HCT 26.6* 27.2*  MCV 101.1* 101.9*  PLT 204 0000000   Basic Metabolic Panel  Recent Labs  11/12/16 2040  11/14/16 0545 11/15/16 0532  NA  --   < > 139 139  K  --   < > 4.4  4.1  CL  --   < > 98* 98*  CO2  --   < > 33* 32  GLUCOSE  --   < > 164* 133*  BUN  --   < > 30* 32*  CREATININE  --   < > 2.07* 1.96*  CALCIUM  --   < > 7.5* 7.5*  MG 2.0  --   --   --   < > = values in this interval not displayed. Liver Function Tests  Recent Labs  11/14/16 0545 11/15/16 0532  AST 31 30  ALT 27 24  ALKPHOS 46 43  BILITOT 0.4 0.5  PROT 5.9* 5.4*  ALBUMIN 2.8* 2.7*   Cardiac Enzymes  Recent Labs  11/12/16 1350  TROPONINI 0.08*    Telemetry     NSR - Personally Reviewed   Radiology    No results found.  Cardiac Studies   Transthoracic Echocardiography 11/11/16 Study Conclusions  - Left ventricle: The cavity size was normal. Wall thickness was increased increased in a pattern of mild to moderate LVH. Systolic function was moderately to severely reduced. The estimated ejection fraction was in the range of 30% to 35%. Wall motion was normal; there were no regional wall  motion abnormalities. - Mitral valve: There was moderate to severe regurgitation. - Left atrium: The atrium was mildly to moderately dilated. - Pulmonary arteries: Systolic pressure was mildly increased. PA peak pressure: 34 mm Hg (S).  Patient Profile     Meghan Anderson is a 81 year old female with a past medical history of COPD, CKD, IIA (T3N0M0) rectal adenocarcinoma, S/P resection of rectal mass by Dr. Marcello Moores on 12/14/2015, now S/P concomitant chemoradiation, HLD recently transferred from [facility] after presenting there for chest pain and shortness of breath  Assessment & Plan    1. NSVT:Improved Started Mexiletine on 11/10/16.  2. Chest pain: has elevated troponin to 0.05. Her family tells me that she complained of chest pain described it as substernal pressure. Troponin at 0.05. Will need heart cath, Echo shows reduced EF of 30-35%. EF was 50-55% in 2016.    Creatinine is 1.96 today. May be ready for cath Monday  3. COPD: continued wheezing, per pulmonary.   4. Acute on chronic kidney disease: Creatinine is 1.96 today.   5. Lethargy: Resolved   6. Acute on chronic systolic CHF: Hydralazine at 25mg  q 8. BP well controlled.   Signed, Arbutus Leas, NP  11/15/2016, 11:19 AM    Patient seen and examined. Agree with assessment and plan. Feels better, but had some chest tightness at 5 am acording to son this am.  Cr is beginning to improve but still elevated; plan to cath early next week ? Monday if further improvement. Bp slightly elevated. Tolerating hydralazine/nitrates; and will increase hydralazine to 37.5 mg q 8 hrs and imdur to 60 mg.    Troy Sine, MD, [facility] 11/15/2016 12:02 PM

## 2016-11-16 ENCOUNTER — Inpatient Hospital Stay (HOSPITAL_COMMUNITY): Payer: Medicare Other

## 2016-11-16 LAB — BASIC METABOLIC PANEL
Anion gap: 8 (ref 5–15)
BUN: 36 mg/dL — ABNORMAL HIGH (ref 6–20)
CHLORIDE: 99 mmol/L — AB (ref 101–111)
CO2: 33 mmol/L — ABNORMAL HIGH (ref 22–32)
Calcium: 7.8 mg/dL — ABNORMAL LOW (ref 8.9–10.3)
Creatinine, Ser: 2.07 mg/dL — ABNORMAL HIGH (ref 0.44–1.00)
GFR calc Af Amer: 25 mL/min — ABNORMAL LOW (ref >60)
GFR calc non Af Amer: 22 mL/min — ABNORMAL LOW (ref >60)
GLUCOSE: 103 mg/dL — AB (ref 65–99)
Potassium: 3.9 mmol/L (ref 3.5–5.1)
Sodium: 140 mmol/L (ref 135–145)

## 2016-11-16 LAB — GLUCOSE, CAPILLARY: Glucose-Capillary: 86 mg/dL (ref 65–99)

## 2016-11-16 LAB — PROCALCITONIN: PROCALCITONIN: 0.15 ng/mL

## 2016-11-16 MED ORDER — FUROSEMIDE 10 MG/ML IJ SOLN
40.0000 mg | Freq: Once | INTRAMUSCULAR | Status: AC
  Administered 2016-11-16: 40 mg via INTRAVENOUS
  Filled 2016-11-16: qty 4

## 2016-11-16 MED ORDER — VITAMIN B-12 1000 MCG PO TABS
1000.0000 ug | ORAL_TABLET | Freq: Every day | ORAL | Status: DC
  Administered 2016-11-16 – 2016-11-20 (×5): 1000 ug via ORAL
  Filled 2016-11-16 (×5): qty 1

## 2016-11-16 NOTE — Progress Notes (Signed)
Patient Name: Meghan Anderson Date of Encounter: 11/16/2016     Principal Problem:   Acute respiratory failure with hypoxia [facility] Active Problems:   Essential hypertension, benign   Hyperlipidemia   Hypothyroidism   Chronic renal disease, stage 3, moderately decreased glomerular filtration rate (GFR) between 30-59 mL/min/1.73 square meter   COPD exacerbation (HCC)   Troponin level elevated   Difficulty in walking, not elsewhere classified   SOB (shortness of breath)   Pure hypercholesterolemia   Cardiomyopathy ([place])   Ischemic cardiomyopathy    SUBJECTIVE  Still some chest pressure and Sob. Not in any distress though.  CURRENT MEDS . amLODipine  10 mg Oral Daily  . atorvastatin  40 mg Oral q1800  . ceFEPime (MAXIPIME) IV  1 g Intravenous Q24H  . fluticasone furoate-vilanterol  1 puff Inhalation Daily  . heparin subcutaneous  5,000 Units Subcutaneous Q8H  . hydrALAZINE  37.5 mg Oral Q8H  . isosorbide mononitrate  60 mg Oral Daily  . levalbuterol  0.63 mg Nebulization BID  . levothyroxine  125 mcg Oral QAC breakfast  . magnesium oxide  400 mg Oral BID  . mexiletine  200 mg Oral Q12H  . pantoprazole  40 mg Oral Daily  . predniSONE  40 mg Oral Q breakfast  . tiotropium  18 mcg Inhalation Daily  . vitamin B-12  1,000 mcg Oral Daily    OBJECTIVE  Vitals:   11/16/16 0412 11/16/16 0621 11/16/16 0629 11/16/16 1105  BP:   135/88 (!) 146/88  Pulse:   71 73  Resp:    (!) 23  Temp:      TempSrc:      SpO2: 92% (!) 85% 91% 94%  Weight: 127 lb 14.4 oz (58 kg)     Height:        Intake/Output Summary (Last 24 hours) at 11/16/16 1153 Last data filed at 11/16/16 0630  Gross per 24 hour  Intake              960 ml  Output              900 ml  Net               60 ml   Filed Weights   11/14/16 0633 11/15/16 0601 11/16/16 0412  Weight: 126 lb 11.2 oz (57.5 kg) 127 lb 4.8 oz (57.7 kg) 127 lb 14.4 oz (58 kg)    PHYSICAL EXAM  General: Pleasant, frail appearing  elderly woman, NAD. Neuro: Alert and oriented X 3. Moves all extremities spontaneously. Psych: Normal affect. HEENT:  Normal  Neck: Supple without bruits or JVD. Lungs:  Resp regular and unlabored, CTA. Heart: RRR no s3, s4, or murmurs. Abdomen: Soft, non-tender, non-distended, BS + x 4.  Extremities: No clubbing, cyanosis or edema. DP/PT/Radials 2+ and equal bilaterally.  Accessory Clinical Findings  CBC  Recent Labs  11/14/16 0545  WBC 5.3  HGB 8.7*  HCT 27.2*  MCV 101.9*  PLT 0000000   Basic Metabolic Panel  Recent Labs  11/15/16 0532 11/16/16 0605  NA 139 140  K 4.1 3.9  CL 98* 99*  CO2 32 33*  GLUCOSE 133* 103*  BUN 32* 36*  CREATININE 1.96* 2.07*  CALCIUM 7.5* 7.8*   Liver Function Tests  Recent Labs  11/14/16 0545 11/15/16 0532  AST 31 30  ALT 27 24  ALKPHOS 46 43  BILITOT 0.4 0.5  PROT 5.9* 5.4*  ALBUMIN 2.8* 2.7*   No  results for input(s): LIPASE, AMYLASE in the last 72 hours. Cardiac Enzymes No results for input(s): CKTOTAL, CKMB, CKMBINDEX, TROPONINI in the last 72 hours. BNP Invalid input(s): POCBNP D-Dimer No results for input(s): DDIMER in the last 72 hours. Hemoglobin A1C No results for input(s): HGBA1C in the last 72 hours. Fasting Lipid Panel No results for input(s): CHOL, HDL, LDLCALC, TRIG, CHOLHDL, LDLDIRECT in the last 72 hours. Thyroid Function Tests No results for input(s): TSH, T4TOTAL, T3FREE, THYROIDAB in the last 72 hours.  Invalid input(s): FREET3  TELE  nsr with nsvt/PVC's  Radiology/Studies  Dg Chest 2 View  Result Date: 11/08/2016 CLINICAL DATA:  81 year old female with chest pain and shortness of breath since yesterday. Initial encounter. Personal history of colon cancer. EXAM: CHEST  2 VIEW COMPARISON:  CT Abdomen and Pelvis 05/22/2016. Chest radiographs 05/22/2016 and earlier. FINDINGS: Upright AP and lateral views of the chest. New small bilateral pleural effusions. Stable mild cardiomegaly. Other mediastinal  contours are within normal limits. Visualized tracheal air column is within normal limits. No pneumothorax. No consolidation. Chronic diffuse increased pulmonary interstitium appears not significantly changed. No acute edema suspected. Osteopenia. Calcified aortic atherosclerosis. No acute osseous abnormality identified. Negative visible bowel gas pattern. IMPRESSION: New small bilateral pleural effusions. No other acute cardiopulmonary abnormality. Electronically Signed   By: Genevie Ann M.D.   On: 11/08/2016 10:57   Dg Chest Port 1 View  Result Date: 11/16/2016 CLINICAL DATA:  Shortness of Breath EXAM: PORTABLE CHEST 1 VIEW COMPARISON:  11/12/2016 FINDINGS: Cardiac shadow is stable. Significant decrease in vascular congestion is noted when compared with the prior exam. No focal confluent infiltrate is seen. No acute bony abnormality is noted. IMPRESSION: Significant improvement in vascular congestion. No significant pulmonary edema is noted. Electronically Signed   By: Inez Catalina M.D.   On: 11/16/2016 11:32   Dg Abd Portable 1v  Result Date: 11/09/2016 CLINICAL DATA:  Impaired nasogastric feeding tube EXAM: PORTABLE ABDOMEN - 1 VIEW COMPARISON:  CT from 05/22/2016, lumbar spine CT 10/14/2016 FINDINGS: The tip and Anderson port of a nasogastric tube is seen in the left upper quadrant in the expected location of the stomach. Scattered gas containing small large bowel loops in a nonobstructive pattern. Levoscoliosis with lumbar spondylosis of the dorsal spine. Urinary probe projects over the mid pelvis. Mild sclerosis about the sacroiliac joints bilaterally. Patient has sacral insufficiency fractures by CT. IMPRESSION: Gastric tube in the expected location the stomach. No bowel obstruction or free air. Levoconvex curvature of the lumbar spine with spondylosis. Electronically Signed   By: Ashley Royalty M.D.   On: 11/09/2016 23:28   Dg Chest Port 1 View  Result Date: 11/12/2016 CLINICAL DATA:  81 year old female  with history of COPD presenting with low oxygen saturations. Congestion. Shortness of breath. Wheezing. EXAM: PORTABLE CHEST 1 VIEW COMPARISON:  Chest x-ray 11/08/2016. FINDINGS: New airspace consolidation in the perihilar aspect of the right lung partially obscuring the right heart border, concerning for developing bronchopneumonia likely involving the right middle lobe. Small bilateral pleural effusions. Cephalization of the pulmonary vasculature, without frank pulmonary edema. Mild cardiomegaly. The patient is rotated to the left on today's exam, resulting in distortion of the mediastinal contours and reduced diagnostic sensitivity and specificity for mediastinal pathology. Atherosclerosis in the thoracic aorta. IMPRESSION: 1. Developing airspace consolidation in the right mid to lower lung, involving at least the right middle lobe, concerning for developing bronchopneumonia. Followup PA and lateral chest X-ray is recommended in 3-4 weeks following trial of  antibiotic therapy to ensure resolution and exclude underlying malignancy. 2. Cardiomegaly with pulmonary venous congestion, but no frank pulmonary edema. 3. Small bilateral pleural effusions. 4. Aortic atherosclerosis. Electronically Signed   By: Vinnie Langton M.D.   On: 11/12/2016 12:27    ASSESSMENT AND PLAN  1. VT - she is improved on Mexitil after VT was suppressed with IV lidocaine. Continue mexitil for now. 2. Chest pain and elevated troponin - in setting of VT - she is on for heart cath on Monday, as per Dr. Leda Gauze.  3. Stage 4 renal insuff. - her creatinine is stable. Will plan to hydrate tomorrow prior to heart cath.   Carleene Overlie Tamberlyn Midgley,M.D.  11/16/2016 11:53 AMPatient ID: Meghan Anderson, female   DOB: 1936-01-19, 81 y.o.   MRN: [id number]

## 2016-11-16 NOTE — Progress Notes (Signed)
[facility] PROGRESS NOTE  Meghan Anderson [id number] DOB: 1936-07-07 DOA: 11/08/2016   PCP: Fransisca Kaufmann Dettinger, MD     Assessment/Plan: Principal Problem:   Acute respiratory failure with hypoxia ([place]) Active Problems:   Essential hypertension, benign   Hyperlipidemia   Hypothyroidism   Chronic renal disease, stage 3, moderately decreased glomerular filtration rate (GFR) between 30-59 mL/min/1.73 square meter   COPD exacerbation (H[facility])   Troponin level elevated   Difficulty in walking, not elsewhere classified   SOB (shortness of breath)   Pure hypercholesterolemia   Cardiomyopathy ([place])   Ischemic cardiomyopathy   Meghan Anderson is a 81F with PMH significant for COPD, CKD III, GERD, HLD, HTN, prior rectal adenoca and hypothyroidism. She was admitted to [facility] on 12/29 with complaints of worsening shortness of breath x 1 day. She was dyspneic both with exertion and at rest. She denied associated cough, sputum, fever, chills, chest pain, palpitations. On presentation she was noted to be hypoxemic. She had a mild troponin leak at 0.05.  Within a few hours, she had increasing shortness of breath and wheezing, persistent hypertension and telemetry reported arrhythmias (bigeminy, trigeminy, brief runs of V tach). She was transferred to ICU. She was seen by cardiology with bedside echo showing EF ~30%. She was started on lidocaine, given iv lasix and transfer to [name] was recommended. She was also started on BiPAP for work of breathing.   She has been short of breath for several days (since 12/26), but had an acute worsening the morning of 12/29. She had gone to her pulmonologist several days ago to get clearance for back surgery and that physician felt that she was acutely exacerbated and gave her a kenalog shot, prednisone and an antibiotic shot. Despite those interventions, she failed to improve. She is usually maintained on Breo, Spiriva and prn nebs.  Patient is now  admitted for acute on chronic hypoxic respiratory failure again related to CHF and COPD exacerbation  Assessment and plan Acute on chronic hypoxemic respiratory failure, acute COPD exacerbation, now HCAP-improving Multifactorial secondary to newly identified cardiomyopathy Continue steroids, resume Breo, Spiriva Continue supplemental oxygen Continue nebulizer treatments CXR 1/2 shows Developing airspace consolidation in the right mid to lower lung, involving at least the right middle lobe, concerning for developing Bronchopneumonia. Received vancomycin/cefepime for HCAP 11/12/16-11/14/16. Blood cultures no growth so far Discontinue vancomycin 11/15/16 Speech therapy evaluation recommends regular diet and thin liquids Currently requiring 3 L of oxygen  Ventricular ectopy/Acute on chronic systolic CHF  [TTE in [id number] w/ EF [id number], diastolic dysfunction] Now LV EF: 30% -   35%  significantly less ventricular ectopy after starting lidocaine.   now on Mexitil  Diuretics managed by cardiology Cr stable. Cardiology held furosemide. With EF 30 - 35% cardiology increased  hydralazine at 37.5 q 8 hrs and continue nitrates  . Hold off on ACE inhibitor Timing of cardiac catheterization to be determined by cardiology   Chest pain: has elevated troponin to 0.05. Will need ischemic workup, ideally she will need cardiac catheterization.  Acute on chronic kidney disease-baseline around 1.6-1.8, renal function more or less stable for 3 days Monitor renal function if she needs cardiac cath. Hold off on nephrotoxic medications   Recent rectal adenoca s/p 5FU then Xeloda, currently off treatment  Hypothyroidism Markedly elevated TSH, free T4 low. Dose of Synthroid increased to 125 g a day Repeat thyroid function tests in 6 weeks    Essential hypertension Continue Norvasc 10  mg daily  Now on hydralazine, Imdur,  Anemia of chronic disease/microcytic anemia - Baseline Hemoglobin 10.5 Can't have B-12  deficiency, started on B-12 supplement   Dyslipidemia - Continue Lipitor    Hypokalemia-repleted  DVT prophylaxsis heparin  Code Status:  Full code     Family Communication: Discussed in detail with the patient/son/daughter, all imaging results, lab results explained to the patient   Disposition Plan:  Continue telemetry. Pending decision about cardiac cath, early next week?      Consultants:  Cardiology  Critical care  Procedures:  None  Antibiotics: Anti-infectives    Start     Dose/Rate Route Frequency Ordered Stop   11/12/16 1500  ceFEPIme (MAXIPIME) 1 g in dextrose 5 % 50 mL IVPB     1 g 100 mL/hr over 30 Minutes Intravenous Every 24 hours 11/12/16 1352     11/12/16 1500  vancomycin (VANCOCIN) IVPB 1000 mg/200 mL premix  Status:  Discontinued     1,000 mg 200 mL/hr over 60 Minutes Intravenous Every 48 hours 11/12/16 1352 11/15/16 0856         HPI/Subjective:   shortness of breath today , chest tightness  Objective: Vitals:   11/16/16 0400 11/16/16 0412 11/16/16 0621 11/16/16 0629  BP: 136/83   135/88  Pulse: 76   71  Resp:      Temp: 97.6 F (36.4 C)     TempSrc: Oral     SpO2:  92% (!) 85% 91%  Weight:  58 kg (127 lb 14.4 oz)    Height:        Intake/Output Summary (Last 24 hours) at 11/16/16 [id number] Last data filed at 11/16/16 0630  Gross per 24 hour  Intake             1080 ml  Output              900 ml  Net              180 ml    Exam:  General Well nourished, well developed, no apparent distress, up to chair  HEENT No gross abnormalities. Oropharynx clear. Mallampati III, good dentition.  Pulmonary Prolonged expiratory phase.  Expiratory wheeze bilateral bases.  Cardiovascular Normal rate, regular rhythm. S1, s2. No m/r/g. Distal pulses palpable.  Abdomen Soft, mild diffuse tenderness to palpation, no guarding or rebound, non-distended, positive bowel sounds, no palpable organomegaly or masses. Normoresonant to percussion.   Musculoskeletal Grossly normal  Lymphatics No cervical, supraclavicular or axillary adenopathy.   Neurologic Grossly intact. No focal deficits.   Skin/Integuement No rash, no cyanosis, no clubbing. No edema.        Data Reviewed: I have personally reviewed following labs and imaging studies  Micro Results Recent Results (from the past 240 hour(s))  MRSA PCR Screening     Status: None   Collection Time: 11/08/16  4:20 PM  Result Value Ref Range Status   MRSA by PCR NEGATIVE NEGATIVE Final    Comment:        The GeneXpert MRSA Assay (FDA approved for NASAL specimens only), is one component of a comprehensive MRSA colonization surveillance program. It is not intended to diagnose MRSA infection nor to guide or monitor treatment for MRSA infections.   Culture, blood (routine x 2)     Status: None (Preliminary result)   Collection Time: 11/13/16 10:27 AM  Result Value Ref Range Status   Specimen Description BLOOD LEFT ANTECUBITAL  Final   Special Requests BOTTLES DRAWN  AEROBIC AND ANAEROBIC  1CC  Final   Culture NO GROWTH 2 DAYS  Final   Report Status PENDING  Incomplete  Culture, blood (routine x 2)     Status: None (Preliminary result)   Collection Time: 11/13/16 10:40 AM  Result Value Ref Range Status   Specimen Description BLOOD LEFT ANTECUBITAL  Final   Special Requests BOTTLES DRAWN AEROBIC AND ANAEROBIC  5CC  Final   Culture NO GROWTH 2 DAYS  Final   Report Status PENDING  Incomplete    Radiology Reports Dg Chest 2 View  Result Date: 11/08/2016 CLINICAL DATA:  81 year old female with chest pain and shortness of breath since yesterday. Initial encounter. Personal history of colon cancer. EXAM: CHEST  2 VIEW COMPARISON:  CT Abdomen and Pelvis 05/22/2016. Chest radiographs 05/22/2016 and earlier. FINDINGS: Upright AP and lateral views of the chest. New small bilateral pleural effusions. Stable mild cardiomegaly. Other mediastinal contours are within normal limits.  Visualized tracheal air column is within normal limits. No pneumothorax. No consolidation. Chronic diffuse increased pulmonary interstitium appears not significantly changed. No acute edema suspected. Osteopenia. Calcified aortic atherosclerosis. No acute osseous abnormality identified. Negative visible bowel gas pattern. IMPRESSION: New small bilateral pleural effusions. No other acute cardiopulmonary abnormality. Electronically Signed   By: Genevie Ann M.D.   On: 11/08/2016 10:57   Dg Abd Portable 1v  Result Date: 11/09/2016 CLINICAL DATA:  Impaired nasogastric feeding tube EXAM: PORTABLE ABDOMEN - 1 VIEW COMPARISON:  CT from 05/22/2016, lumbar spine CT 10/14/2016 FINDINGS: The tip and side port of a nasogastric tube is seen in the left upper quadrant in the expected location of the stomach. Scattered gas containing small large bowel loops in a nonobstructive pattern. Levoscoliosis with lumbar spondylosis of the dorsal spine. Urinary probe projects over the mid pelvis. Mild sclerosis about the sacroiliac joints bilaterally. Patient has sacral insufficiency fractures by CT. IMPRESSION: Gastric tube in the expected location the stomach. No bowel obstruction or free air. Levoconvex curvature of the lumbar spine with spondylosis. Electronically Signed   By: Ashley Royalty M.D.   On: 11/09/2016 23:28   Dg Chest Port 1 View  Result Date: 11/12/2016 CLINICAL DATA:  81 year old female with history of COPD presenting with low oxygen saturations. Congestion. Shortness of breath. Wheezing. EXAM: PORTABLE CHEST 1 VIEW COMPARISON:  Chest x-ray 11/08/2016. FINDINGS: New airspace consolidation in the perihilar aspect of the right lung partially obscuring the right heart border, concerning for developing bronchopneumonia likely involving the right middle lobe. Small bilateral pleural effusions. Cephalization of the pulmonary vasculature, without frank pulmonary edema. Mild cardiomegaly. The patient is rotated to the left on  today's exam, resulting in distortion of the mediastinal contours and reduced diagnostic sensitivity and specificity for mediastinal pathology. Atherosclerosis in the thoracic aorta. IMPRESSION: 1. Developing airspace consolidation in the right mid to lower lung, involving at least the right middle lobe, concerning for developing bronchopneumonia. Followup PA and lateral chest X-ray is recommended in 3-4 weeks following trial of antibiotic therapy to ensure resolution and exclude underlying malignancy. 2. Cardiomegaly with pulmonary venous congestion, but no frank pulmonary edema. 3. Small bilateral pleural effusions. 4. Aortic atherosclerosis. Electronically Signed   By: Vinnie Langton M.D.   On: 11/12/2016 12:27     CBC  Recent Labs Lab 11/10/16 0241 11/12/16 0751 11/13/16 0625 11/14/16 0545  WBC 6.6 5.2 4.8 5.3  HGB 9.4* 9.6* 8.6* 8.7*  HCT 28.6* 29.8* 26.6* 27.2*  PLT 235 205 204 188  MCV 100.0 105.3*  101.1* 101.9*  MCH 32.9 33.9 32.7 32.6  MCHC 32.9 32.2 32.3 32.0  RDW 15.1 15.4 14.8 14.7    Chemistries   Recent Labs Lab 11/10/16 0241 11/11/16 0717 11/12/16 2040 11/13/16 0625 11/14/16 0545 11/15/16 0532 11/16/16 0605  NA 145 143  --  143 139 139 140  K 3.0* 3.6  --  3.2* 4.4 4.1 3.9  CL 99* 100*  --  97* 98* 98* 99*  CO2 34* 32  --  34* 33* 32 33*  GLUCOSE 136* 90  --  103* 164* 133* 103*  BUN 32* 33*  --  29* 30* 32* 36*  CREATININE 1.98* 1.83*  --  2.03* 2.07* 1.96* 2.07*  CALCIUM 7.6* 7.7*  --  7.8* 7.5* 7.5* 7.8*  MG 1.5*  --  2.0  --   --   --   --   AST  --   --   --  23 31 30   --   ALT  --   --   --  22 27 24   --   ALKPHOS  --   --   --  43 46 43  --   BILITOT  --   --   --  0.6 0.4 0.5  --    ------------------------------------------------------------------------------------------------------------------ estimated creatinine clearance is 18.4 mL/min (by C-G formula based on SCr of 2.07 mg/dL  (H)). ------------------------------------------------------------------------------------------------------------------ No results for input(s): HGBA1C in the last 72 hours. ------------------------------------------------------------------------------------------------------------------ No results for input(s): CHOL, HDL, LDLCALC, TRIG, CHOLHDL, LDLDIRECT in the last 72 hours. ------------------------------------------------------------------------------------------------------------------ No results for input(s): TSH, T4TOTAL, T3FREE, THYROIDAB in the last 72 hours.  Invalid input(s): FREET3 ------------------------------------------------------------------------------------------------------------------  Recent Labs  11/13/16 1027  VITAMINB12 264  FOLATE 9.7  FERRITIN 116  TIBC 256  IRON 33  RETICCTPCT 1.8    Coagulation profile No results for input(s): INR, PROTIME in the last 168 hours.  No results for input(s): DDIMER in the last 72 hours.  Cardiac Enzymes  Recent Labs Lab 11/12/16 1350  TROPONINI 0.08*   ------------------------------------------------------------------------------------------------------------------ Invalid input(s): POCBNP   CBG:  Recent Labs Lab 11/10/16 0740 11/10/16 1628 11/11/16 0721 11/12/16 0805 11/15/16 0746  GLUCAP 121* 142* 84 105* 109*       Studies: No results found.    No results found for: HGBA1C Lab Results  Component Value Date   LDLCALC 171 (H) 07/05/2016   CREATININE 2.07 (H) 11/16/2016       Scheduled Meds: . amLODipine  10 mg Oral Daily  . atorvastatin  40 mg Oral q1800  . ceFEPime (MAXIPIME) IV  1 g Intravenous Q24H  . fluticasone furoate-vilanterol  1 puff Inhalation Daily  . heparin subcutaneous  5,000 Units Subcutaneous Q8H  . hydrALAZINE  37.5 mg Oral Q8H  . isosorbide mononitrate  60 mg Oral Daily  . levalbuterol  0.63 mg Nebulization BID  . levothyroxine  125 mcg Oral QAC breakfast  .  magnesium oxide  400 mg Oral BID  . mexiletine  200 mg Oral Q12H  . pantoprazole  40 mg Oral Daily  . predniSONE  40 mg Oral Q breakfast  . tiotropium  18 mcg Inhalation Daily  . vitamin B-12  1,000 mcg Oral Daily   Continuous Infusions:   LOS: 8 days    Time spent: >30 MINS    [facility]  [facility] Pager [phone]. If 7PM-7AM, please contact night-coverage at [email or web address], password [facility] 11/16/2016, 8:33 AM  LOS: 8 days

## 2016-11-16 NOTE — Progress Notes (Signed)
Pt c/o of chest pressure while resting in bed. V/S stable. EKG obtained. Pt's chest pressure resolved after she expelled some gas. MD aware. Will cont to monitor pt.

## 2016-11-17 LAB — GLUCOSE, CAPILLARY: Glucose-Capillary: 97 mg/dL (ref 65–99)

## 2016-11-17 LAB — COMPREHENSIVE METABOLIC PANEL
ALBUMIN: 2.7 g/dL — AB (ref 3.5–5.0)
ALK PHOS: 41 U/L (ref 38–126)
ALT: 21 U/L (ref 14–54)
ANION GAP: 11 (ref 5–15)
AST: 21 U/L (ref 15–41)
BUN: 39 mg/dL — ABNORMAL HIGH (ref 6–20)
CALCIUM: 8.2 mg/dL — AB (ref 8.9–10.3)
CO2: 34 mmol/L — AB (ref 22–32)
Chloride: 97 mmol/L — ABNORMAL LOW (ref 101–111)
Creatinine, Ser: 2.21 mg/dL — ABNORMAL HIGH (ref 0.44–1.00)
GFR calc non Af Amer: 20 mL/min — ABNORMAL LOW (ref >60)
GFR, EST AFRICAN AMERICAN: 23 mL/min — AB (ref >60)
Glucose, Bld: 135 mg/dL — ABNORMAL HIGH (ref 65–99)
POTASSIUM: 3.9 mmol/L (ref 3.5–5.1)
SODIUM: 142 mmol/L (ref 135–145)
Total Bilirubin: 0.5 mg/dL (ref 0.3–1.2)
Total Protein: 5.6 g/dL — ABNORMAL LOW (ref 6.5–8.1)

## 2016-11-17 MED ORDER — SODIUM CHLORIDE 0.9% FLUSH
3.0000 mL | Freq: Two times a day (BID) | INTRAVENOUS | Status: DC
  Administered 2016-11-17 (×2): 3 mL via INTRAVENOUS

## 2016-11-17 MED ORDER — ASPIRIN 81 MG PO CHEW
81.0000 mg | CHEWABLE_TABLET | ORAL | Status: AC
  Administered 2016-11-18: 81 mg via ORAL
  Filled 2016-11-17: qty 1

## 2016-11-17 MED ORDER — SODIUM CHLORIDE 0.9 % WEIGHT BASED INFUSION
1.0000 mL/kg/h | INTRAVENOUS | Status: DC
  Administered 2016-11-17: 1 mL/kg/h via INTRAVENOUS

## 2016-11-17 MED ORDER — SODIUM CHLORIDE 0.9 % IV SOLN: 250.0000 mL | INTRAVENOUS | Status: DC | PRN

## 2016-11-17 MED ORDER — SODIUM CHLORIDE 0.9% FLUSH: 3.0000 mL | INTRAVENOUS | Status: DC | PRN

## 2016-11-17 NOTE — Progress Notes (Signed)
Patient stable on 3L nasal cannula without distress. BiPAP not placed on at this time. RT will continue to monitor as needed.

## 2016-11-17 NOTE — Progress Notes (Signed)
[facility] PROGRESS NOTE  LAILYNN RILING [id number] DOB: [birth year] DOA: 11/08/2016   PCP: Fransisca Kaufmann Dettinger, MD     Assessment/Plan: Principal Problem:   Acute respiratory failure with hypoxia ([place]) Active Problems:   Essential hypertension, benign   Hyperlipidemia   Hypothyroidism   Chronic renal disease, stage 3, moderately decreased glomerular filtration rate (GFR) between 30-59 mL/min/1.73 square meter   COPD exacerbation (HCC)   Troponin level elevated   Difficulty in walking, not elsewhere classified   SOB (shortness of breath)   Pure hypercholesterolemia   Cardiomyopathy ([place])   Ischemic cardiomyopathy   Ms. Rinehart is a 81F with PMH significant for COPD, CKD III, GERD, HLD, HTN, prior rectal adenoca and hypothyroidism. She was admitted to [facility] on 12/29 with complaints of worsening shortness of breath x 1 day. She was dyspneic both with exertion and at rest. She denied associated cough, sputum, fever, chills, chest pain, palpitations. On presentation she was noted to be hypoxemic. She had a mild troponin leak at 0.05.  Within a few hours, she had increasing shortness of breath and wheezing, persistent hypertension and telemetry reported arrhythmias (bigeminy, trigeminy, brief runs of V tach). She was transferred to ICU. She was seen by cardiology with bedside echo showing EF ~30%. She was started on lidocaine, given iv lasix and transfer to [name] was recommended. She was also started on BiPAP for work of breathing.   She has been short of breath for several days (since 12/26), but had an acute worsening the morning of 12/29. She had gone to her pulmonologist several days ago to get clearance for back surgery and that physician felt that she was acutely exacerbated and gave her a kenalog shot, prednisone and an antibiotic shot. Despite those interventions, she failed to improve. She is usually maintained on Breo, Spiriva and prn nebs.  Patient is now  admitted for acute on chronic hypoxic respiratory failure again related to CHF and COPD exacerbation  Assessment and plan Acute on chronic hypoxemic respiratory failure, acute COPD exacerbation, now HCAP-improving Multifactorial secondary to newly identified cardiomyopathy Continue steroids, resume Breo, Spiriva Continue supplemental oxygen Continue nebulizer treatments CXR 1/2 shows Developing airspace consolidation in the right mid to lower lung, involving at least the right middle lobe, concerning for developing Bronchopneumonia. Received vancomycin/cefepime for HCAP 11/12/16-11/14/16. Blood cultures no growth so far Discontinue vancomycin 11/15/16, continue cefepime until 1/9 Speech therapy evaluation recommends regular diet and thin liquids Currently requiring 3 L of oxygen    Ventricular ectopy/Acute on chronic systolic CHF  [TTE in [id number] w/ EF [id number], diastolic dysfunction] Now LV EF: 30% -   35%  significantly less ventricular ectopy after starting lidocaine.   now on Mexitil  Diuretics managed by cardiology Cr stable. Cardiology held furosemide. With EF 30 - 35% cardiology increased  hydralazine at 37.5 q 8 hrs and continue nitrates  . Hold off on ACE inhibitor Timing of cardiac catheterization to be determined by cardiology Creatinine up slightly, after receiving Lasix 40 mg IV yesterday for dyspnea   Chest pain: has elevated troponin to 0.05. Will need ischemic workup, ideally she will need cardiac catheterization.  Acute on chronic kidney disease-baseline around 1.6-1.8, renal function more or less stable for 3 days Monitor creatinine closely, slightly up today after receiving Lasix 1/6 for dyspnea   Recent rectal adenoca s/p 5FU then Xeloda, currently off treatment  Hypothyroidism Markedly elevated TSH, free T4 low. Dose of Synthroid increased to 125 g  a day Repeat thyroid function tests in 6 weeks    Essential hypertension Continue Norvasc 10 mg daily  Now on  hydralazine, Imdur,  Anemia of chronic disease/microcytic anemia - Baseline Hemoglobin 10.5 Can't have B-12 deficiency, started on B-12 supplement   Dyslipidemia - Continue Lipitor    Hypokalemia-repleted  DVT prophylaxsis heparin  Code Status:  Full code     Family Communication: Discussed in detail with the patient/son/daughter, all imaging results, lab results explained to the patient   Disposition Plan:  Continue telemetry. Pending decision about cardiac cath, in a.m.?      Consultants:  Cardiology  Critical care  Procedures:  None  Antibiotics: Anti-infectives    Start     Dose/Rate Route Frequency Ordered Stop   11/12/16 1500  ceFEPIme (MAXIPIME) 1 g in dextrose 5 % 50 mL IVPB     1 g 100 mL/hr over 30 Minutes Intravenous Every 24 hours 11/12/16 1352     11/12/16 1500  vancomycin (VANCOCIN) IVPB 1000 mg/200 mL premix  Status:  Discontinued     1,000 mg 200 mL/hr over 60 Minutes Intravenous Every 48 hours 11/12/16 1352 11/15/16 0856         HPI/Subjective:   shortness of breath today , chest tightness Improved today  Objective: Vitals:   11/16/16 2251 11/17/16 0500 11/17/16 0830 11/17/16 0841  BP: 135/82 136/90 (!) 143/85   Pulse: 70 69 73   Resp:  20 (!) 22   Temp:  98.5 F (36.9 C)    TempSrc:  Oral    SpO2:  96% 95% 96%  Weight:  56 kg (123 lb 8 oz)    Height:        Intake/Output Summary (Last 24 hours) at 11/17/16 [id number] Last data filed at 11/17/16 0700  Gross per 24 hour  Intake              540 ml  Output              400 ml  Net              140 ml    Exam:  General Well nourished, well developed, no apparent distress, up to chair  HEENT No gross abnormalities. Oropharynx clear. Mallampati III, good dentition.  Pulmonary Prolonged expiratory phase.  Expiratory wheeze bilateral bases.  Cardiovascular Normal rate, regular rhythm. S1, s2. No m/r/g. Distal pulses palpable.  Abdomen Soft, mild diffuse tenderness to palpation,  no guarding or rebound, non-distended, positive bowel sounds, no palpable organomegaly or masses. Normoresonant to percussion.  Musculoskeletal Grossly normal  Lymphatics No cervical, supraclavicular or axillary adenopathy.   Neurologic Grossly intact. No focal deficits.   Skin/Integuement No rash, no cyanosis, no clubbing. No edema.        Data Reviewed: I have personally reviewed following labs and imaging studies  Micro Results Recent Results (from the past 240 hour(s))  MRSA PCR Screening     Status: None   Collection Time: 11/08/16  4:20 PM  Result Value Ref Range Status   MRSA by PCR NEGATIVE NEGATIVE Final    Comment:        The GeneXpert MRSA Assay (FDA approved for NASAL specimens only), is one component of a comprehensive MRSA colonization surveillance program. It is not intended to diagnose MRSA infection nor to guide or monitor treatment for MRSA infections.   Culture, blood (routine x 2)     Status: None (Preliminary result)   Collection Time: 11/13/16 10:27 AM  Result Value Ref Range Status   Specimen Description BLOOD LEFT ANTECUBITAL  Final   Special Requests BOTTLES DRAWN AEROBIC AND ANAEROBIC  1CC  Final   Culture NO GROWTH 3 DAYS  Final   Report Status PENDING  Incomplete  Culture, blood (routine x 2)     Status: None (Preliminary result)   Collection Time: 11/13/16 10:40 AM  Result Value Ref Range Status   Specimen Description BLOOD LEFT ANTECUBITAL  Final   Special Requests BOTTLES DRAWN AEROBIC AND ANAEROBIC  5CC  Final   Culture NO GROWTH 3 DAYS  Final   Report Status PENDING  Incomplete    Radiology Reports Dg Chest 2 View  Result Date: 11/08/2016 CLINICAL DATA:  81 year old female with chest pain and shortness of breath since yesterday. Initial encounter. Personal history of colon cancer. EXAM: CHEST  2 VIEW COMPARISON:  CT Abdomen and Pelvis 05/22/2016. Chest radiographs 05/22/2016 and earlier. FINDINGS: Upright AP and lateral views of the  chest. New small bilateral pleural effusions. Stable mild cardiomegaly. Other mediastinal contours are within normal limits. Visualized tracheal air column is within normal limits. No pneumothorax. No consolidation. Chronic diffuse increased pulmonary interstitium appears not significantly changed. No acute edema suspected. Osteopenia. Calcified aortic atherosclerosis. No acute osseous abnormality identified. Negative visible bowel gas pattern. IMPRESSION: New small bilateral pleural effusions. No other acute cardiopulmonary abnormality. Electronically Signed   By: Genevie Ann M.D.   On: 11/08/2016 10:57   Dg Chest Port 1 View  Result Date: 11/16/2016 CLINICAL DATA:  Shortness of Breath EXAM: PORTABLE CHEST 1 VIEW COMPARISON:  11/12/2016 FINDINGS: Cardiac shadow is stable. Significant decrease in vascular congestion is noted when compared with the prior exam. No focal confluent infiltrate is seen. No acute bony abnormality is noted. IMPRESSION: Significant improvement in vascular congestion. No significant pulmonary edema is noted. Electronically Signed   By: Inez Catalina M.D.   On: 11/16/2016 11:32   Dg Abd Portable 1v  Result Date: 11/09/2016 CLINICAL DATA:  Impaired nasogastric feeding tube EXAM: PORTABLE ABDOMEN - 1 VIEW COMPARISON:  CT from 05/22/2016, lumbar spine CT 10/14/2016 FINDINGS: The tip and side port of a nasogastric tube is seen in the left upper quadrant in the expected location of the stomach. Scattered gas containing small large bowel loops in a nonobstructive pattern. Levoscoliosis with lumbar spondylosis of the dorsal spine. Urinary probe projects over the mid pelvis. Mild sclerosis about the sacroiliac joints bilaterally. Patient has sacral insufficiency fractures by CT. IMPRESSION: Gastric tube in the expected location the stomach. No bowel obstruction or free air. Levoconvex curvature of the lumbar spine with spondylosis. Electronically Signed   By: Ashley Royalty M.D.   On: 11/09/2016 23:28    Dg Chest Port 1 View  Result Date: 11/12/2016 CLINICAL DATA:  81 year old female with history of COPD presenting with low oxygen saturations. Congestion. Shortness of breath. Wheezing. EXAM: PORTABLE CHEST 1 VIEW COMPARISON:  Chest x-ray 11/08/2016. FINDINGS: New airspace consolidation in the perihilar aspect of the right lung partially obscuring the right heart border, concerning for developing bronchopneumonia likely involving the right middle lobe. Small bilateral pleural effusions. Cephalization of the pulmonary vasculature, without frank pulmonary edema. Mild cardiomegaly. The patient is rotated to the left on today's exam, resulting in distortion of the mediastinal contours and reduced diagnostic sensitivity and specificity for mediastinal pathology. Atherosclerosis in the thoracic aorta. IMPRESSION: 1. Developing airspace consolidation in the right mid to lower lung, involving at least the right middle lobe, concerning for developing bronchopneumonia.  Followup PA and lateral chest X-ray is recommended in 3-4 weeks following trial of antibiotic therapy to ensure resolution and exclude underlying malignancy. 2. Cardiomegaly with pulmonary venous congestion, but no frank pulmonary edema. 3. Small bilateral pleural effusions. 4. Aortic atherosclerosis. Electronically Signed   By: Vinnie Langton M.D.   On: 11/12/2016 12:27     CBC  Recent Labs Lab 11/12/16 0751 11/13/16 0625 11/14/16 0545  WBC 5.2 4.8 5.3  HGB 9.6* 8.6* 8.7*  HCT 29.8* 26.6* 27.2*  PLT 205 204 188  MCV 105.3* 101.1* 101.9*  MCH 33.9 32.7 32.6  MCHC 32.2 32.3 32.0  RDW 15.4 14.8 14.7    Chemistries   Recent Labs Lab 11/12/16 2040 11/13/16 0625 11/14/16 0545 11/15/16 0532 11/16/16 0605 11/17/16 0402  NA  --  143 139 139 140 142  K  --  3.2* 4.4 4.1 3.9 3.9  CL  --  97* 98* 98* 99* 97*  CO2  --  34* 33* 32 33* 34*  GLUCOSE  --  103* 164* 133* 103* 135*  BUN  --  29* 30* 32* 36* 39*  CREATININE  --  2.03*  2.07* 1.96* 2.07* 2.21*  CALCIUM  --  7.8* 7.5* 7.5* 7.8* 8.2*  MG 2.0  --   --   --   --   --   AST  --  23 31 30   --  21  ALT  --  22 27 24   --  21  ALKPHOS  --  43 46 43  --  41  BILITOT  --  0.6 0.4 0.5  --  0.5   ------------------------------------------------------------------------------------------------------------------ estimated creatinine clearance is 15.8 mL/min (by C-G formula based on SCr of 2.21 mg/dL (H)). ------------------------------------------------------------------------------------------------------------------ No results for input(s): HGBA1C in the last 72 hours. ------------------------------------------------------------------------------------------------------------------ No results for input(s): CHOL, HDL, LDLCALC, TRIG, CHOLHDL, LDLDIRECT in the last 72 hours. ------------------------------------------------------------------------------------------------------------------ No results for input(s): TSH, T4TOTAL, T3FREE, THYROIDAB in the last 72 hours.  Invalid input(s): FREET3 ------------------------------------------------------------------------------------------------------------------ No results for input(s): VITAMINB12, FOLATE, FERRITIN, TIBC, IRON, RETICCTPCT in the last 72 hours.  Coagulation profile No results for input(s): INR, PROTIME in the last 168 hours.  No results for input(s): DDIMER in the last 72 hours.  Cardiac Enzymes  Recent Labs Lab 11/12/16 1350  TROPONINI 0.08*   ------------------------------------------------------------------------------------------------------------------ Invalid input(s): POCBNP   CBG:  Recent Labs Lab 11/11/16 0721 11/12/16 0805 11/15/16 0746 11/16/16 1010 11/17/16 0606  GLUCAP 84 105* 109* 86 97       Studies: Dg Chest Port 1 View  Result Date: 11/16/2016 CLINICAL DATA:  Shortness of Breath EXAM: PORTABLE CHEST 1 VIEW COMPARISON:  11/12/2016 FINDINGS: Cardiac shadow is stable.  Significant decrease in vascular congestion is noted when compared with the prior exam. No focal confluent infiltrate is seen. No acute bony abnormality is noted. IMPRESSION: Significant improvement in vascular congestion. No significant pulmonary edema is noted. Electronically Signed   By: Inez Catalina M.D.   On: 11/16/2016 11:32      No results found for: HGBA1C Lab Results  Component Value Date   LDLCALC 171 (H) 07/05/2016   CREATININE 2.21 (H) 11/17/2016       Scheduled Meds: . amLODipine  10 mg Oral Daily  . atorvastatin  40 mg Oral q1800  . ceFEPime (MAXIPIME) IV  1 g Intravenous Q24H  . fluticasone furoate-vilanterol  1 puff Inhalation Daily  . heparin subcutaneous  5,000 Units Subcutaneous Q8H  . hydrALAZINE  37.5 mg Oral Q8H  .  isosorbide mononitrate  60 mg Oral Daily  . levalbuterol  0.63 mg Nebulization BID  . levothyroxine  125 mcg Oral QAC breakfast  . magnesium oxide  400 mg Oral BID  . mexiletine  200 mg Oral Q12H  . pantoprazole  40 mg Oral Daily  . predniSONE  40 mg Oral Q breakfast  . tiotropium  18 mcg Inhalation Daily  . vitamin B-12  1,000 mcg Oral Daily   Continuous Infusions:   LOS: 9 days    Time spent: >30 MINS    [facility]  [facility] Pager [phone]. If 7PM-7AM, please contact night-coverage at [email or web address], password [facility] 11/17/2016, 9:07 AM  LOS: 9 days

## 2016-11-17 NOTE — Progress Notes (Signed)
Patient Name: Meghan Anderson Date of Encounter: 11/17/2016     Principal Problem:   Acute respiratory failure with hypoxia [facility]) Active Problems:   Essential hypertension, benign   Hyperlipidemia   Hypothyroidism   Chronic renal disease, stage 3, moderately decreased glomerular filtration rate (GFR) between 30-59 mL/min/1.73 square meter   COPD exacerbation (HCC)   Troponin level elevated   Difficulty in walking, not elsewhere classified   SOB (shortness of breath)   Pure hypercholesterolemia   Cardiomyopathy ([place])   Ischemic cardiomyopathy    SUBJECTIVE  Dyspnea back to baseline and no symptomatic palpitations.  CURRENT MEDS . amLODipine  10 mg Oral Daily  . atorvastatin  40 mg Oral q1800  . ceFEPime (MAXIPIME) IV  1 g Intravenous Q24H  . fluticasone furoate-vilanterol  1 puff Inhalation Daily  . heparin subcutaneous  5,000 Units Subcutaneous Q8H  . hydrALAZINE  37.5 mg Oral Q8H  . isosorbide mononitrate  60 mg Oral Daily  . levalbuterol  0.63 mg Nebulization BID  . levothyroxine  125 mcg Oral QAC breakfast  . magnesium oxide  400 mg Oral BID  . mexiletine  200 mg Oral Q12H  . pantoprazole  40 mg Oral Daily  . predniSONE  40 mg Oral Q breakfast  . tiotropium  18 mcg Inhalation Daily  . vitamin B-12  1,000 mcg Oral Daily    OBJECTIVE  Vitals:   11/16/16 2251 11/17/16 0500 11/17/16 0830 11/17/16 0841  BP: 135/82 136/90 (!) 143/85   Pulse: 70 69 73   Resp:  20 (!) 22   Temp:  98.5 F (36.9 C)    TempSrc:  Oral    SpO2:  96% 95% 96%  Weight:  123 lb 8 oz (56 kg)    Height:        Intake/Output Summary (Last 24 hours) at 11/17/16 1316 Last data filed at 11/17/16 1100  Gross per 24 hour  Intake              420 ml  Output              600 ml  Net             -180 ml   Filed Weights   11/15/16 0601 11/16/16 0412 11/17/16 0500  Weight: 127 lb 4.8 oz (57.7 kg) 127 lb 14.4 oz (58 kg) 123 lb 8 oz (56 kg)    PHYSICAL EXAM  General: Pleasant, 81 yo  woman, NAD. Neuro: Alert and oriented X 3. Moves all extremities spontaneously. Psych: Normal affect. HEENT:  Normal  Neck: Supple without bruits or JVD. Lungs:  Resp regular and unlabored, CTA. Heart: RRR no s3, s4, or murmurs. Abdomen: Soft, non-tender, non-distended, BS + x 4.  Extremities: No clubbing, cyanosis or edema. DP/PT/Radials 2+ and equal bilaterally.  Accessory Clinical Findings  CBC No results for input(s): WBC, NEUTROABS, HGB, HCT, MCV, PLT in the last 72 hours. Basic Metabolic Panel  Recent Labs  11/16/16 0605 11/17/16 0402  NA 140 142  K 3.9 3.9  CL 99* 97*  CO2 33* 34*  GLUCOSE 103* 135*  BUN 36* 39*  CREATININE 2.07* 2.21*  CALCIUM 7.8* 8.2*   Liver Function Tests  Recent Labs  11/15/16 0532 11/17/16 0402  AST 30 21  ALT 24 21  ALKPHOS 43 41  BILITOT 0.5 0.5  PROT 5.4* 5.6*  ALBUMIN 2.7* 2.7*   No results for input(s): LIPASE, AMYLASE in the last 72 hours. Cardiac Enzymes  No results for input(s): CKTOTAL, CKMB, CKMBINDEX, TROPONINI in the last 72 hours. BNP Invalid input(s): POCBNP D-Dimer No results for input(s): DDIMER in the last 72 hours. Hemoglobin A1C No results for input(s): HGBA1C in the last 72 hours. Fasting Lipid Panel No results for input(s): CHOL, HDL, LDLCALC, TRIG, CHOLHDL, LDLDIRECT in the last 72 hours. Thyroid Function Tests No results for input(s): TSH, T4TOTAL, T3FREE, THYROIDAB in the last 72 hours.  Invalid input(s): FREET3  TELE nsr  Radiology/Studies  Dg Chest 2 View  Result Date: 11/08/2016 CLINICAL DATA:  81 year old female with chest pain and shortness of breath since yesterday. Initial encounter. Personal history of colon cancer. EXAM: CHEST  2 VIEW COMPARISON:  CT Abdomen and Pelvis 05/22/2016. Chest radiographs 05/22/2016 and earlier. FINDINGS: Upright AP and lateral views of the chest. New small bilateral pleural effusions. Stable mild cardiomegaly. Other mediastinal contours are within normal limits.  Visualized tracheal air column is within normal limits. No pneumothorax. No consolidation. Chronic diffuse increased pulmonary interstitium appears not significantly changed. No acute edema suspected. Osteopenia. Calcified aortic atherosclerosis. No acute osseous abnormality identified. Negative visible bowel gas pattern. IMPRESSION: New small bilateral pleural effusions. No other acute cardiopulmonary abnormality. Electronically Signed   By: Genevie Ann M.D.   On: 11/08/2016 10:57   Dg Chest Port 1 View  Result Date: 11/16/2016 CLINICAL DATA:  Shortness of Breath EXAM: PORTABLE CHEST 1 VIEW COMPARISON:  11/12/2016 FINDINGS: Cardiac shadow is stable. Significant decrease in vascular congestion is noted when compared with the prior exam. No focal confluent infiltrate is seen. No acute bony abnormality is noted. IMPRESSION: Significant improvement in vascular congestion. No significant pulmonary edema is noted. Electronically Signed   By: Inez Catalina M.D.   On: 11/16/2016 11:32   Dg Abd Portable 1v  Result Date: 11/09/2016 CLINICAL DATA:  Impaired nasogastric feeding tube EXAM: PORTABLE ABDOMEN - 1 VIEW COMPARISON:  CT from 05/22/2016, lumbar spine CT 10/14/2016 FINDINGS: The tip and Anderson port of a nasogastric tube is seen in the left upper quadrant in the expected location of the stomach. Scattered gas containing small large bowel loops in a nonobstructive pattern. Levoscoliosis with lumbar spondylosis of the dorsal spine. Urinary probe projects over the mid pelvis. Mild sclerosis about the sacroiliac joints bilaterally. Patient has sacral insufficiency fractures by CT. IMPRESSION: Gastric tube in the expected location the stomach. No bowel obstruction or free air. Levoconvex curvature of the lumbar spine with spondylosis. Electronically Signed   By: Ashley Royalty M.D.   On: 11/09/2016 23:28   Dg Chest Port 1 View  Result Date: 11/12/2016 CLINICAL DATA:  81 year old female with history of COPD presenting with  low oxygen saturations. Congestion. Shortness of breath. Wheezing. EXAM: PORTABLE CHEST 1 VIEW COMPARISON:  Chest x-ray 11/08/2016. FINDINGS: New airspace consolidation in the perihilar aspect of the right lung partially obscuring the right heart border, concerning for developing bronchopneumonia likely involving the right middle lobe. Small bilateral pleural effusions. Cephalization of the pulmonary vasculature, without frank pulmonary edema. Mild cardiomegaly. The patient is rotated to the left on today's exam, resulting in distortion of the mediastinal contours and reduced diagnostic sensitivity and specificity for mediastinal pathology. Atherosclerosis in the thoracic aorta. IMPRESSION: 1. Developing airspace consolidation in the right mid to lower lung, involving at least the right middle lobe, concerning for developing bronchopneumonia. Followup PA and lateral chest X-ray is recommended in 3-4 weeks following trial of antibiotic therapy to ensure resolution and exclude underlying malignancy. 2. Cardiomegaly with pulmonary venous congestion,  but no frank pulmonary edema. 3. Small bilateral pleural effusions. 4. Aortic atherosclerosis. Electronically Signed   By: Vinnie Langton M.D.   On: 11/12/2016 12:27    ASSESSMENT AND PLAN 1. VT - she is tolerating Mexitil very nicely. VT/PVC's have been suppressed. Continue 2. Chest pain with elevated troponin - she will undergo cardiac cath tomorrow. Will hydrate today. 3. Stage 4 renal insuff. - will begin hydration and check creatinine tomorrow.   Carleene Overlie Shizuko Wojdyla,M.D.  11/17/2016 1:16 PMPatient ID: Meghan Anderson, female   DOB: [birth year], 81 y.o.   MRN: [id number]

## 2016-11-18 ENCOUNTER — Encounter (HOSPITAL_COMMUNITY)
Admission: EM | Disposition: A | Discharge status: Home Health | Payer: Self-pay | Source: Home / Self Care | Attending: Internal Medicine

## 2016-11-18 ENCOUNTER — Encounter (HOSPITAL_COMMUNITY): Payer: Self-pay | Admitting: Interventional Cardiology

## 2016-11-18 DIAGNOSIS — I2510 Atherosclerotic heart disease of native coronary artery without angina pectoris: Secondary | ICD-10-CM

## 2016-11-18 HISTORY — PX: CARDIAC CATHETERIZATION: SHX172

## 2016-11-18 LAB — CBC
HCT: 26.1 % — ABNORMAL LOW (ref 36.0–46.0)
HEMATOCRIT: 26.7 % — AB (ref 36.0–46.0)
Hemoglobin: 8.6 g/dL — ABNORMAL LOW (ref 12.0–15.0)
Hemoglobin: 8.7 g/dL — ABNORMAL LOW (ref 12.0–15.0)
MCH: 33.5 pg (ref 26.0–34.0)
MCH: 33.3 pg (ref 26.0–34.0)
MCHC: 33 g/dL (ref 30.0–36.0)
MCHC: 32.6 g/dL (ref 30.0–36.0)
MCV: 101.6 fL — AB (ref 78.0–100.0)
MCV: 102.3 fL — AB (ref 78.0–100.0)
PLATELETS: 203 10*3/uL (ref 150–400)
PLATELETS: 227 10*3/uL (ref 150–400)
RBC: 2.57 MIL/uL — AB (ref 3.87–5.11)
RBC: 2.61 MIL/uL — ABNORMAL LOW (ref 3.87–5.11)
RDW: 14.5 % (ref 11.5–15.5)
RDW: 14.7 % (ref 11.5–15.5)
WBC: 5.2 10*3/uL (ref 4.0–10.5)
WBC: 6.6 10*3/uL (ref 4.0–10.5)

## 2016-11-18 LAB — CULTURE, BLOOD (ROUTINE X 2)
Culture: NO GROWTH
Culture: NO GROWTH

## 2016-11-18 LAB — POCT I-STAT 3, VENOUS BLOOD GAS (G3P V)
Acid-Base Excess: 6 mmol/L — ABNORMAL HIGH (ref 0.0–2.0)
Acid-Base Excess: 6 mmol/L — ABNORMAL HIGH (ref 0.0–2.0)
BICARBONATE: 32 mmol/L — AB (ref 20.0–28.0)
Bicarbonate: 32.7 mmol/L — ABNORMAL HIGH (ref 20.0–28.0)
O2 SAT: 79 %
O2 Saturation: 79 %
PCO2 VEN: 51.2 mmHg (ref 44.0–60.0)
PCO2 VEN: 53.7 mmHg (ref 44.0–60.0)
PH VEN: 7.393 (ref 7.250–7.430)
PH VEN: 7.403 (ref 7.250–7.430)
PO2 VEN: 44 mmHg (ref 32.0–45.0)
PO2 VEN: 45 mmHg (ref 32.0–45.0)
TCO2: 34 mmol/L (ref 0–100)
TCO2: 34 mmol/L (ref 0–100)

## 2016-11-18 LAB — POCT I-STAT 3, ART BLOOD GAS (G3+)
Acid-Base Excess: 7 mmol/L — ABNORMAL HIGH (ref 0.0–2.0)
Bicarbonate: 32.1 mmol/L — ABNORMAL HIGH (ref 20.0–28.0)
O2 Saturation: 99 %
PCO2 ART: 48.7 mmHg — AB (ref 32.0–48.0)
PH ART: 7.427 (ref 7.350–7.450)
TCO2: 34 mmol/L (ref 0–100)
pO2, Arterial: 130 mmHg — ABNORMAL HIGH (ref 83.0–108.0)

## 2016-11-18 LAB — COMPREHENSIVE METABOLIC PANEL
ALT: 18 U/L (ref 14–54)
AST: 19 U/L (ref 15–41)
Albumin: 2.7 g/dL — ABNORMAL LOW (ref 3.5–5.0)
Alkaline Phosphatase: 39 U/L (ref 38–126)
Anion gap: 8 (ref 5–15)
BILIRUBIN TOTAL: 0.5 mg/dL (ref 0.3–1.2)
BUN: 36 mg/dL — AB (ref 6–20)
CHLORIDE: 101 mmol/L (ref 101–111)
CO2: 32 mmol/L (ref 22–32)
CREATININE: 1.88 mg/dL — AB (ref 0.44–1.00)
Calcium: 7.8 mg/dL — ABNORMAL LOW (ref 8.9–10.3)
GFR, EST AFRICAN AMERICAN: 28 mL/min — AB (ref >60)
GFR, EST NON AFRICAN AMERICAN: 24 mL/min — AB (ref >60)
Glucose, Bld: 91 mg/dL (ref 65–99)
Potassium: 3.8 mmol/L (ref 3.5–5.1)
Sodium: 141 mmol/L (ref 135–145)
TOTAL PROTEIN: 5.6 g/dL — AB (ref 6.5–8.1)

## 2016-11-18 LAB — CREATININE, SERUM
Creatinine, Ser: 1.91 mg/dL — ABNORMAL HIGH (ref 0.44–1.00)
GFR calc non Af Amer: 24 mL/min — ABNORMAL LOW (ref >60)
GFR, EST AFRICAN AMERICAN: 28 mL/min — AB (ref >60)

## 2016-11-18 LAB — GLUCOSE, CAPILLARY: GLUCOSE-CAPILLARY: 90 mg/dL (ref 65–99)

## 2016-11-18 LAB — PROTIME-INR
INR: 1.03
PROTHROMBIN TIME: 13.5 s (ref 11.4–15.2)

## 2016-11-18 SURGERY — LEFT HEART CATH AND CORONARY ANGIOGRAPHY
Anesthesia: LOCAL

## 2016-11-18 MED ORDER — HEPARIN SODIUM (PORCINE) 1000 UNIT/ML IJ SOLN
INTRAMUSCULAR | Status: DC | PRN
  Administered 2016-11-18: 3000 [IU] via INTRAVENOUS

## 2016-11-18 MED ORDER — ONDANSETRON HCL 4 MG/2ML IJ SOLN: 4.0000 mg | Freq: Four times a day (QID) | INTRAMUSCULAR | Status: DC | PRN

## 2016-11-18 MED ORDER — SODIUM CHLORIDE 0.9 % IV SOLN: INTRAVENOUS | Status: AC

## 2016-11-18 MED ORDER — SODIUM CHLORIDE 0.9% FLUSH
3.0000 mL | Freq: Two times a day (BID) | INTRAVENOUS | Status: DC
  Administered 2016-11-19 – 2016-11-20 (×3): 3 mL via INTRAVENOUS

## 2016-11-18 MED ORDER — LIDOCAINE HCL (PF) 1 % IJ SOLN
INTRAMUSCULAR | Status: DC | PRN
  Administered 2016-11-18 (×2): 2 mL

## 2016-11-18 MED ORDER — ACETAMINOPHEN 325 MG PO TABS: 650.0000 mg | ORAL_TABLET | ORAL | Status: DC | PRN

## 2016-11-18 MED ORDER — HEPARIN (PORCINE) IN NACL 2-0.9 UNIT/ML-% IJ SOLN
INTRAMUSCULAR | Status: DC | PRN
  Administered 2016-11-18: 1000 mL

## 2016-11-18 MED ORDER — VERAPAMIL HCL 2.5 MG/ML IV SOLN
INTRAVENOUS | Status: AC
  Filled 2016-11-18: qty 2

## 2016-11-18 MED ORDER — SODIUM CHLORIDE 0.9 % IV SOLN: 250.0000 mL | INTRAVENOUS | Status: DC | PRN

## 2016-11-18 MED ORDER — FENTANYL CITRATE (PF) 100 MCG/2ML IJ SOLN
INTRAMUSCULAR | Status: AC
Start: 2016-11-18 — End: 2016-11-18
  Filled 2016-11-18: qty 2

## 2016-11-18 MED ORDER — LIDOCAINE HCL (PF) 1 % IJ SOLN
INTRAMUSCULAR | Status: AC
  Filled 2016-11-18: qty 30

## 2016-11-18 MED ORDER — MIDAZOLAM HCL 2 MG/2ML IJ SOLN
INTRAMUSCULAR | Status: AC
  Filled 2016-11-18: qty 2

## 2016-11-18 MED ORDER — HEPARIN SODIUM (PORCINE) 5000 UNIT/ML IJ SOLN
5000.0000 [IU] | Freq: Three times a day (TID) | INTRAMUSCULAR | Status: DC
  Administered 2016-11-18 – 2016-11-20 (×5): 5000 [IU] via SUBCUTANEOUS
  Filled 2016-11-18 (×5): qty 1

## 2016-11-18 MED ORDER — IOPAMIDOL (ISOVUE-370) INJECTION 76%
INTRAVENOUS | Status: AC
  Filled 2016-11-18: qty 100

## 2016-11-18 MED ORDER — VERAPAMIL HCL 2.5 MG/ML IV SOLN
INTRAVENOUS | Status: DC | PRN
  Administered 2016-11-18: 10 mL via INTRA_ARTERIAL

## 2016-11-18 MED ORDER — MIDAZOLAM HCL 2 MG/2ML IJ SOLN
INTRAMUSCULAR | Status: DC | PRN
  Administered 2016-11-18: 1 mg via INTRAVENOUS

## 2016-11-18 MED ORDER — HEPARIN (PORCINE) IN NACL 2-0.9 UNIT/ML-% IJ SOLN
INTRAMUSCULAR | Status: AC
  Filled 2016-11-18: qty 1000

## 2016-11-18 MED ORDER — SODIUM CHLORIDE 0.9% FLUSH: 3.0000 mL | INTRAVENOUS | Status: DC | PRN

## 2016-11-18 MED ORDER — IOPAMIDOL (ISOVUE-370) INJECTION 76%
INTRAVENOUS | Status: DC | PRN
  Administered 2016-11-18: 40 mL via INTRA_ARTERIAL

## 2016-11-18 SURGICAL SUPPLY — 15 items
CATH BALLN WEDGE 5F 110CM (CATHETERS) ×1 IMPLANT
CATH EXPO 5F FL3.5 (CATHETERS) ×1 IMPLANT
CATH EXPO 5FR FR4 (CATHETERS) ×1 IMPLANT
DEVICE RAD COMP TR BAND LRG (VASCULAR PRODUCTS) ×1 IMPLANT
GLIDESHEATH SLEND SS 6F .021 (SHEATH) ×1 IMPLANT
GUIDEWIRE INQWIRE 1.5J.035X260 (WIRE) IMPLANT
INQWIRE 1.5J .035X260CM (WIRE) ×2
KIT HEART LEFT (KITS) ×2 IMPLANT
PACK CARDIAC CATHETERIZATION (CUSTOM PROCEDURE TRAY) ×2 IMPLANT
SHEATH FAST CATH BRACH 5F 5CM (SHEATH) ×1 IMPLANT
TRANSDUCER W/STOPCOCK (MISCELLANEOUS) ×2 IMPLANT
TUBING CIL FLEX 10 FLL-RA (TUBING) ×2 IMPLANT
TUBING CONTRAST HIGH PRESS 20 (MISCELLANEOUS) ×1 IMPLANT
WIRE ASAHI PROWATER 180CM (WIRE) ×1 IMPLANT
WIRE HI TORQ VERSACORE-J 145CM (WIRE) ×1 IMPLANT

## 2016-11-18 NOTE — Progress Notes (Signed)
[facility] PROGRESS NOTE  Meghan Anderson [id number] DOB: [birth year] DOA: 11/08/2016   PCP: Fransisca Kaufmann Dettinger, MD     Assessment/Plan: Principal Problem:   Acute respiratory failure with hypoxia ([place]) Active Problems:   Essential hypertension, benign   Hyperlipidemia   Hypothyroidism   Chronic renal disease, stage 3, moderately decreased glomerular filtration rate (GFR) between 30-59 mL/min/1.73 square meter   COPD exacerbation (HCC)   Troponin level elevated   Difficulty in walking, not elsewhere classified   SOB (shortness of breath)   Pure hypercholesterolemia   Cardiomyopathy ([place])   Ischemic cardiomyopathy   Meghan Anderson is a 81F with PMH significant for COPD, CKD III, GERD, HLD, HTN, prior rectal adenoca and hypothyroidism. She was admitted to [facility] on 12/29 with complaints of worsening shortness of breath x 1 day. She was dyspneic both with exertion and at rest. She denied associated cough, sputum, fever, chills, chest pain, palpitations. On presentation she was noted to be hypoxemic. She had a mild troponin leak at 0.05.  Within a few hours, she had increasing shortness of breath and wheezing, persistent hypertension and telemetry reported arrhythmias (bigeminy, trigeminy, brief runs of V tach). She was transferred to ICU. She was seen by cardiology with bedside echo showing EF ~30%. She was started on lidocaine, given iv lasix and transfer to [name] was recommended. She was also started on BiPAP for work of breathing.   She has been short of breath for several days (since 12/26), but had an acute worsening the morning of 12/29. She had gone to her pulmonologist several days ago to get clearance for back surgery and that physician felt that she was acutely exacerbated and gave her a kenalog shot, prednisone and an antibiotic shot. Despite those interventions, she failed to improve. She is usually maintained on Breo, Spiriva and prn nebs.  Patient is now  admitted for acute on chronic hypoxic respiratory failure again related to CHF and COPD exacerbation  Assessment and plan Acute on chronic hypoxemic respiratory failure, acute COPD exacerbation, now HCAP-improving Multifactorial secondary to newly identified cardiomyopathy Continue steroids, resume Breo, Spiriva Continue supplemental oxygen Continue nebulizer treatments CXR 1/2 shows Developing airspace consolidation in the right mid to lower lung, involving at least the right middle lobe, concerning for developing Bronchopneumonia. Received vancomycin/cefepime for HCAP 11/12/16-11/14/16. Blood cultures no growth so far Discontinue vancomycin 11/15/16, continue cefepime until 1/9 Speech therapy evaluation recommends regular diet and thin liquids Currently requiring 3 L of oxygen    Ventricular ectopy/Acute on chronic systolic CHF  [TTE in [id number] w/ EF [id number], diastolic dysfunction] Now LV EF: 30% -   35%  significantly less ventricular ectopy after starting lidocaine.   now on Mexitil  Diuretics/fluids managed by cardiology  With EF 30 - 35% cardiology increased  hydralazine at 37.5 q 8 hrs and continue nitrates  . Hold off on ACE inhibitor Scheduled for cardiac cath 1/8 Received Lasix 40 mg IV 1 on 1/6   Chest pain: has elevated troponin to 0.05. Cardiac cath today Mild to moderate scattered coronary artery disease which does not appear to explain her global LV dysfunction. Severe disease only in a single secondary branch. Continue aggressive medical therapy  Acute on chronic kidney disease-baseline around 1.6-1.8, renal function more or less stable for 3 days Monitor creatinine closely, given Lasix 1/6 for dyspnea Monitor renal function, if stable anticipate discharge tomorrow   Recent rectal adenoca s/p 5FU then Xeloda, currently off treatment  Hypothyroidism Markedly elevated TSH, free T4 low. Dose of Synthroid increased to 125 g a day Repeat thyroid function tests in 6  weeks    Essential hypertension Continue Norvasc 10 mg daily  Now on hydralazine, Imdur,  Anemia of chronic disease/microcytic anemia - Baseline Hemoglobin 10.5 Can't have B-12 deficiency, started on B-12 supplement   Dyslipidemia - Continue Lipitor    Hypokalemia-repleted  DVT prophylaxsis heparin  Code Status:  Full code     Family Communication: Discussed in detail with the patient/son/daughter, all imaging results, lab results explained to the patient   Disposition Plan:  Continue telemetry. Pending decision about cardiac cath, today Anticipate discharge tomorrow if renal function stable and if cleared by cardiology      Consultants:  Cardiology  Critical care  Procedures:  None  Antibiotics: Anti-infectives    Start     Dose/Rate Route Frequency Ordered Stop   11/12/16 1500  ceFEPIme (MAXIPIME) 1 g in dextrose 5 % 50 mL IVPB     1 g 100 mL/hr over 30 Minutes Intravenous Every 24 hours 11/12/16 1352     11/12/16 1500  vancomycin (VANCOCIN) IVPB 1000 mg/200 mL premix  Status:  Discontinued     1,000 mg 200 mL/hr over 60 Minutes Intravenous Every 48 hours 11/12/16 1352 11/15/16 0856         HPI/Subjective: Feels well, denies chest pain and SOB.    Objective: Vitals:   11/18/16 0500 11/18/16 0807 11/18/16 0825 11/18/16 0828  BP: 139/90 (!) 157/95    Pulse: 72     Resp: (!) 25     Temp: 97.4 F (36.3 C)     TempSrc: Oral     SpO2: 97%  94% 96%  Weight: 55.4 kg (122 lb 1.6 oz)     Height:        Intake/Output Summary (Last 24 hours) at 11/18/16 0953 Last data filed at 11/18/16 0700  Gross per 24 hour  Intake           250.67 ml  Output              600 ml  Net          -349.33 ml    Exam:  General Well nourished, well developed, no apparent distress, up to chair  HEENT No gross abnormalities. Oropharynx clear. Mallampati III, good dentition.  Pulmonary Prolonged expiratory phase.  Expiratory wheeze bilateral bases.   Cardiovascular Normal rate, regular rhythm. S1, s2. No m/r/g. Distal pulses palpable.  Abdomen Soft, mild diffuse tenderness to palpation, no guarding or rebound, non-distended, positive bowel sounds, no palpable organomegaly or masses. Normoresonant to percussion.  Musculoskeletal Grossly normal  Lymphatics No cervical, supraclavicular or axillary adenopathy.   Neurologic Grossly intact. No focal deficits.   Skin/Integuement No rash, no cyanosis, no clubbing. No edema.        Data Reviewed: I have personally reviewed following labs and imaging studies  Micro Results Recent Results (from the past 240 hour(s))  MRSA PCR Screening     Status: None   Collection Time: 11/08/16  4:20 PM  Result Value Ref Range Status   MRSA by PCR NEGATIVE NEGATIVE Final    Comment:        The GeneXpert MRSA Assay (FDA approved for NASAL specimens only), is one component of a comprehensive MRSA colonization surveillance program. It is not intended to diagnose MRSA infection nor to guide or monitor treatment for MRSA infections.   Culture, blood (routine x 2)  Status: None (Preliminary result)   Collection Time: 11/13/16 10:27 AM  Result Value Ref Range Status   Specimen Description BLOOD LEFT ANTECUBITAL  Final   Special Requests BOTTLES DRAWN AEROBIC AND ANAEROBIC  1CC  Final   Culture NO GROWTH 4 DAYS  Final   Report Status PENDING  Incomplete  Culture, blood (routine x 2)     Status: None (Preliminary result)   Collection Time: 11/13/16 10:40 AM  Result Value Ref Range Status   Specimen Description BLOOD LEFT ANTECUBITAL  Final   Special Requests BOTTLES DRAWN AEROBIC AND ANAEROBIC  5CC  Final   Culture NO GROWTH 4 DAYS  Final   Report Status PENDING  Incomplete    Radiology Reports Dg Chest 2 View  Result Date: 11/08/2016 CLINICAL DATA:  81 year old female with chest pain and shortness of breath since yesterday. Initial encounter. Personal history of colon cancer. EXAM: CHEST  2  VIEW COMPARISON:  CT Abdomen and Pelvis 05/22/2016. Chest radiographs 05/22/2016 and earlier. FINDINGS: Upright AP and lateral views of the chest. New small bilateral pleural effusions. Stable mild cardiomegaly. Other mediastinal contours are within normal limits. Visualized tracheal air column is within normal limits. No pneumothorax. No consolidation. Chronic diffuse increased pulmonary interstitium appears not significantly changed. No acute edema suspected. Osteopenia. Calcified aortic atherosclerosis. No acute osseous abnormality identified. Negative visible bowel gas pattern. IMPRESSION: New small bilateral pleural effusions. No other acute cardiopulmonary abnormality. Electronically Signed   By: Genevie Ann M.D.   On: 11/08/2016 10:57   Dg Chest Port 1 View  Result Date: 11/16/2016 CLINICAL DATA:  Shortness of Breath EXAM: PORTABLE CHEST 1 VIEW COMPARISON:  11/12/2016 FINDINGS: Cardiac shadow is stable. Significant decrease in vascular congestion is noted when compared with the prior exam. No focal confluent infiltrate is seen. No acute bony abnormality is noted. IMPRESSION: Significant improvement in vascular congestion. No significant pulmonary edema is noted. Electronically Signed   By: Inez Catalina M.D.   On: 11/16/2016 11:32   Dg Abd Portable 1v  Result Date: 11/09/2016 CLINICAL DATA:  Impaired nasogastric feeding tube EXAM: PORTABLE ABDOMEN - 1 VIEW COMPARISON:  CT from 05/22/2016, lumbar spine CT 10/14/2016 FINDINGS: The tip and side port of a nasogastric tube is seen in the left upper quadrant in the expected location of the stomach. Scattered gas containing small large bowel loops in a nonobstructive pattern. Levoscoliosis with lumbar spondylosis of the dorsal spine. Urinary probe projects over the mid pelvis. Mild sclerosis about the sacroiliac joints bilaterally. Patient has sacral insufficiency fractures by CT. IMPRESSION: Gastric tube in the expected location the stomach. No bowel obstruction  or free air. Levoconvex curvature of the lumbar spine with spondylosis. Electronically Signed   By: Ashley Royalty M.D.   On: 11/09/2016 23:28   Dg Chest Port 1 View  Result Date: 11/12/2016 CLINICAL DATA:  82 year old female with history of COPD presenting with low oxygen saturations. Congestion. Shortness of breath. Wheezing. EXAM: PORTABLE CHEST 1 VIEW COMPARISON:  Chest x-ray 11/08/2016. FINDINGS: New airspace consolidation in the perihilar aspect of the right lung partially obscuring the right heart border, concerning for developing bronchopneumonia likely involving the right middle lobe. Small bilateral pleural effusions. Cephalization of the pulmonary vasculature, without frank pulmonary edema. Mild cardiomegaly. The patient is rotated to the left on today's exam, resulting in distortion of the mediastinal contours and reduced diagnostic sensitivity and specificity for mediastinal pathology. Atherosclerosis in the thoracic aorta. IMPRESSION: 1. Developing airspace consolidation in the right mid to lower  lung, involving at least the right middle lobe, concerning for developing bronchopneumonia. Followup PA and lateral chest X-ray is recommended in 3-4 weeks following trial of antibiotic therapy to ensure resolution and exclude underlying malignancy. 2. Cardiomegaly with pulmonary venous congestion, but no frank pulmonary edema. 3. Small bilateral pleural effusions. 4. Aortic atherosclerosis. Electronically Signed   By: Vinnie Langton M.D.   On: 11/12/2016 12:27     CBC  Recent Labs Lab 11/12/16 0751 11/13/16 0625 11/14/16 0545 11/18/16 0703  WBC 5.2 4.8 5.3 5.2  HGB 9.6* 8.6* 8.7* 8.6*  HCT 29.8* 26.6* 27.2* 26.1*  PLT 205 204 188 203  MCV 105.3* 101.1* 101.9* 101.6*  MCH 33.9 32.7 32.6 33.5  MCHC 32.2 32.3 32.0 33.0  RDW 15.4 14.8 14.7 14.5    Chemistries   Recent Labs Lab 11/12/16 2040  11/13/16 0625 11/14/16 0545 11/15/16 0532 11/16/16 0605 11/17/16 0402 11/18/16 0703  NA   --   < > 143 139 139 140 142 141  K  --   < > 3.2* 4.4 4.1 3.9 3.9 3.8  CL  --   < > 97* 98* 98* 99* 97* 101  CO2  --   < > 34* 33* 32 33* 34* 32  GLUCOSE  --   < > 103* 164* 133* 103* 135* 91  BUN  --   < > 29* 30* 32* 36* 39* 36*  CREATININE  --   < > 2.03* 2.07* 1.96* 2.07* 2.21* 1.88*  CALCIUM  --   < > 7.8* 7.5* 7.5* 7.8* 8.2* 7.8*  MG 2.0  --   --   --   --   --   --   --   AST  --   --  23 31 30   --  21 19  ALT  --   --  22 27 24   --  21 18  ALKPHOS  --   --  43 46 43  --  41 39  BILITOT  --   --  0.6 0.4 0.5  --  0.5 0.5  < > = values in this interval not displayed. ------------------------------------------------------------------------------------------------------------------ estimated creatinine clearance is 18.6 mL/min (by C-G formula based on SCr of 1.88 mg/dL (H)). ------------------------------------------------------------------------------------------------------------------ No results for input(s): HGBA1C in the last 72 hours. ------------------------------------------------------------------------------------------------------------------ No results for input(s): CHOL, HDL, LDLCALC, TRIG, CHOLHDL, LDLDIRECT in the last 72 hours. ------------------------------------------------------------------------------------------------------------------ No results for input(s): TSH, T4TOTAL, T3FREE, THYROIDAB in the last 72 hours.  Invalid input(s): FREET3 ------------------------------------------------------------------------------------------------------------------ No results for input(s): VITAMINB12, FOLATE, FERRITIN, TIBC, IRON, RETICCTPCT in the last 72 hours.  Coagulation profile  Recent Labs Lab 11/18/16 0703  INR 1.03    No results for input(s): DDIMER in the last 72 hours.  Cardiac Enzymes  Recent Labs Lab 11/12/16 1350  TROPONINI 0.08*    ------------------------------------------------------------------------------------------------------------------ Invalid input(s): POCBNP   CBG:  Recent Labs Lab 11/12/16 0805 11/15/16 0746 11/16/16 1010 11/17/16 0606 11/18/16 0745  GLUCAP 105* 109* 86 97 90       Studies: Dg Chest Port 1 View  Result Date: 11/16/2016 CLINICAL DATA:  Shortness of Breath EXAM: PORTABLE CHEST 1 VIEW COMPARISON:  11/12/2016 FINDINGS: Cardiac shadow is stable. Significant decrease in vascular congestion is noted when compared with the prior exam. No focal confluent infiltrate is seen. No acute bony abnormality is noted. IMPRESSION: Significant improvement in vascular congestion. No significant pulmonary edema is noted. Electronically Signed   By: Inez Catalina M.D.   On: 11/16/2016 11:32  No results found for: HGBA1C Lab Results  Component Value Date   LDLCALC 171 (H) 07/05/2016   CREATININE 1.88 (H) 11/18/2016       Scheduled Meds: . amLODipine  10 mg Oral Daily  . atorvastatin  40 mg Oral q1800  . ceFEPime (MAXIPIME) IV  1 g Intravenous Q24H  . fluticasone furoate-vilanterol  1 puff Inhalation Daily  . heparin subcutaneous  5,000 Units Subcutaneous Q8H  . hydrALAZINE  37.5 mg Oral Q8H  . isosorbide mononitrate  60 mg Oral Daily  . levalbuterol  0.63 mg Nebulization BID  . levothyroxine  125 mcg Oral QAC breakfast  . magnesium oxide  400 mg Oral BID  . mexiletine  200 mg Oral Q12H  . pantoprazole  40 mg Oral Daily  . predniSONE  40 mg Oral Q breakfast  . sodium chloride flush  3 mL Intravenous Q12H  . tiotropium  18 mcg Inhalation Daily  . vitamin B-12  1,000 mcg Oral Daily   Continuous Infusions: . sodium chloride 1 mL/kg/hr (11/17/16 2040)     LOS: 10 days    Time spent: >30 MINS    [facility]  [facility] Pager [phone]. If 7PM-7AM, please contact night-coverage at [email or web address], password [facility] 11/18/2016, 9:53 AM  LOS: 10 days

## 2016-11-18 NOTE — Progress Notes (Signed)
PT Cancellation Note  Patient Details Name: Meghan Anderson MRN: [id number] DOB: 21-Dec-1935   Cancelled Treatment:    Reason Eval/Treat Not Completed: Patient at procedure or test/unavailable. Pt off floor at cath lab and then will be on bed rest. PT to return as able, as appropriate.   Jenavie Stanczak M Alyne Martinson 11/18/2016, 11:48 AM   Kittie Plater, PT, DPT Pager #: [phone] Office #: [phone]

## 2016-11-18 NOTE — H&P (View-Only) (Signed)
Patient Name: Meghan Anderson Date of Encounter: 11/18/2016  Primary Cardiologist: New, Dr. [facility] Problem List     Principal Problem:   Acute respiratory failure with hypoxia [facility]) Active Problems:   Essential hypertension, benign   Hyperlipidemia   Hypothyroidism   Chronic renal disease, stage 3, moderately decreased glomerular filtration rate (GFR) between 30-59 mL/min/1.73 square meter   COPD exacerbation (HCC)   Troponin level elevated   Difficulty in walking, not elsewhere classified   SOB (shortness of breath)   Pure hypercholesterolemia   Cardiomyopathy ([place])   Ischemic cardiomyopathy     Subjective   Feels well, denies chest pain and SOB. Ready for heart cath.   Inpatient Medications    Scheduled Meds: . amLODipine  10 mg Oral Daily  . atorvastatin  40 mg Oral q1800  . ceFEPime (MAXIPIME) IV  1 g Intravenous Q24H  . fluticasone furoate-vilanterol  1 puff Inhalation Daily  . heparin subcutaneous  5,000 Units Subcutaneous Q8H  . hydrALAZINE  37.5 mg Oral Q8H  . isosorbide mononitrate  60 mg Oral Daily  . levalbuterol  0.63 mg Nebulization BID  . levothyroxine  125 mcg Oral QAC breakfast  . magnesium oxide  400 mg Oral BID  . mexiletine  200 mg Oral Q12H  . pantoprazole  40 mg Oral Daily  . predniSONE  40 mg Oral Q breakfast  . sodium chloride flush  3 mL Intravenous Q12H  . tiotropium  18 mcg Inhalation Daily  . vitamin B-12  1,000 mcg Oral Daily   Continuous Infusions: . sodium chloride 1 mL/kg/hr (11/17/16 2040)   PRN Meds: sodium chloride, acetaminophen **OR** acetaminophen, bisacodyl, guaiFENesin-dextromethorphan, hydrALAZINE, levalbuterol, ondansetron **OR** ondansetron (ZOFRAN) IV, polyethylene glycol, sodium chloride flush, temazepam   Vital Signs    Vitals:   11/17/16 1428 11/17/16 1537 11/17/16 2100 11/18/16 0500  BP: 131/74  131/83 139/90  Pulse: 70  73 72  Resp: (!) 25  18 (!) 25  Temp:  98.3 F (36.8 C) 97.9 F (36.6 C) 97.4  F (36.3 C)  TempSrc:  Oral Oral Oral  SpO2: 96%  98% 97%  Weight:    122 lb 1.6 oz (55.4 kg)  Height:        Intake/Output Summary (Last 24 hours) at 11/18/16 0758 Last data filed at 11/17/16 2300  Gross per 24 hour  Intake           250.67 ml  Output              600 ml  Net          -349.33 ml   Filed Weights   11/16/16 0412 11/17/16 0500 11/18/16 0500  Weight: 127 lb 14.4 oz (58 kg) 123 lb 8 oz (56 kg) 122 lb 1.6 oz (55.4 kg)    Physical Exam   GEN: Well nourished, well developed elderly female in no acute distress.  HEENT: Grossly normal.  Neck: Supple, no JVD, carotid bruits, or masses. Cardiac: RRR, no murmurs, rubs, or gallops. No clubbing, cyanosis, edema.  Radials/DP/PT 2+ and equal bilaterally.  Respiratory:  Respirations regular and unlabored, clear to auscultation bilaterally. GI: Soft, nontender, nondistended, BS + x 4. MS: no deformity or atrophy. Skin: warm and dry, no rash. Neuro:  Strength and sensation are intact. Psych: AAOx3.  Normal affect.  Labs    Basic Metabolic Panel  Recent Labs  11/17/16 0402 11/18/16 0703  NA 142 141  K 3.9 3.8  CL 97* 101  CO2 34* 32  GLUCOSE 135* 91  BUN 39* PENDING  CREATININE 2.21* 1.88*  CALCIUM 8.2* 7.8*   Liver Function Tests  Recent Labs  11/17/16 0402 11/18/16 0703  AST 21 19  ALT 21 18  ALKPHOS 41 39  BILITOT 0.5 0.5  PROT 5.6* 5.6*  ALBUMIN 2.7* 2.7*    Telemetry    NSR - Personally Reviewed    Radiology    Dg Chest Port 1 View  Result Date: 11/16/2016 CLINICAL DATA:  Shortness of Breath EXAM: PORTABLE CHEST 1 VIEW COMPARISON:  11/12/2016 FINDINGS: Cardiac shadow is stable. Significant decrease in vascular congestion is noted when compared with the prior exam. No focal confluent infiltrate is seen. No acute bony abnormality is noted. IMPRESSION: Significant improvement in vascular congestion. No significant pulmonary edema is noted. Electronically Signed   By: Meghan Anderson M.D.   On:  11/16/2016 11:32    Cardiac Studies  Transthoracic Echocardiography 11/11/16 Study Conclusions  - Left ventricle: The cavity size was normal. Wall thickness was   increased increased in a pattern of mild to moderate LVH.   Systolic function was moderately to severely reduced. The   estimated ejection fraction was in the range of 30% to 35%. Wall   motion was normal; there were no regional wall motion   abnormalities. - Mitral valve: There was moderate to severe regurgitation. - Left atrium: The atrium was mildly to moderately dilated. - Pulmonary arteries: Systolic pressure was mildly increased. PA   peak pressure: 34 mm Hg (S).   Patient Profile     [name] is a 81 year old female with a past medical history of COPD, CKD, IIA (T3N0M0) rectal adenocarcinoma, S/P resection of rectal mass by Dr. Marcello Anderson on 12/14/2015, now S/P concomitant chemoradiation, HLD recently transferred from [facility] on 11/08/16 after presenting there for chest pain and shortness of breath. Troponin 0.05>>0.04>>0.08. Echo with reduced EF of 35% for cath today (had to await correction of renal function).   Assessment & Plan    1. NSVT:Improved Started Mexiletine on 11/10/16. Tolerating well, no VT.  2. Chest pain/NSTEMI: has elevated troponin to 0.05. Echo shows reduced EF of 30-35%. EF was 50-55% in 2016. For cath today.   3. COPD: continued wheezing, per pulmonary.   4. Acute on chronic kidney disease: Creatinine is 1.88today.   5. Lethargy: Resolved   6. Acute on chronic systolic CHF: Euvolemic on exam. Continue hydralazine 37.5 q 8 hours, isosorbide 60mg  daily. No ACE-I/ARB.   7. HCAP: Per primary team.   Signed, Meghan Leas, NP  11/18/2016, 7:58 AM   I have personally seen and examined this patient with Meghan Booze, NP. I agree with the assessment and plan as outlined above. Plans for cardiac cath today.   Meghan Anderson 11/18/2016 10:13 AM

## 2016-11-18 NOTE — Care Management Important Message (Signed)
Important Message  Patient Details  Name: Meghan Anderson MRN: [id number] Date of Birth: [birth year]   Medicare Important Message Given:  Yes    Nathen May 11/18/2016, 1:02 PM

## 2016-11-18 NOTE — Progress Notes (Signed)
Patient Name: Meghan Anderson Date of Encounter: 11/18/2016  Primary Cardiologist: New, Dr. [facility] Problem List     Principal Problem:   Acute respiratory failure with hypoxia [facility]) Active Problems:   Essential hypertension, benign   Hyperlipidemia   Hypothyroidism   Chronic renal disease, stage 3, moderately decreased glomerular filtration rate (GFR) between 30-59 mL/min/1.73 square meter   COPD exacerbation (HCC)   Troponin level elevated   Difficulty in walking, not elsewhere classified   SOB (shortness of breath)   Pure hypercholesterolemia   Cardiomyopathy ([place])   Ischemic cardiomyopathy     Subjective   Feels well, denies chest pain and SOB. Ready for heart cath.   Inpatient Medications    Scheduled Meds: . amLODipine  10 mg Oral Daily  . atorvastatin  40 mg Oral q1800  . ceFEPime (MAXIPIME) IV  1 g Intravenous Q24H  . fluticasone furoate-vilanterol  1 puff Inhalation Daily  . heparin subcutaneous  5,000 Units Subcutaneous Q8H  . hydrALAZINE  37.5 mg Oral Q8H  . isosorbide mononitrate  60 mg Oral Daily  . levalbuterol  0.63 mg Nebulization BID  . levothyroxine  125 mcg Oral QAC breakfast  . magnesium oxide  400 mg Oral BID  . mexiletine  200 mg Oral Q12H  . pantoprazole  40 mg Oral Daily  . predniSONE  40 mg Oral Q breakfast  . sodium chloride flush  3 mL Intravenous Q12H  . tiotropium  18 mcg Inhalation Daily  . vitamin B-12  1,000 mcg Oral Daily   Continuous Infusions: . sodium chloride 1 mL/kg/hr (11/17/16 2040)   PRN Meds: sodium chloride, acetaminophen **OR** acetaminophen, bisacodyl, guaiFENesin-dextromethorphan, hydrALAZINE, levalbuterol, ondansetron **OR** ondansetron (ZOFRAN) IV, polyethylene glycol, sodium chloride flush, temazepam   Vital Signs    Vitals:   11/17/16 1428 11/17/16 1537 11/17/16 2100 11/18/16 0500  BP: 131/74  131/83 139/90  Pulse: 70  73 72  Resp: (!) 25  18 (!) 25  Temp:  98.3 F (36.8 C) 97.9 F (36.6 C) 97.4  F (36.3 C)  TempSrc:  Oral Oral Oral  SpO2: 96%  98% 97%  Weight:    122 lb 1.6 oz (55.4 kg)  Height:        Intake/Output Summary (Last 24 hours) at 11/18/16 0758 Last data filed at 11/17/16 2300  Gross per 24 hour  Intake           250.67 ml  Output              600 ml  Net          -349.33 ml   Filed Weights   11/16/16 0412 11/17/16 0500 11/18/16 0500  Weight: 127 lb 14.4 oz (58 kg) 123 lb 8 oz (56 kg) 122 lb 1.6 oz (55.4 kg)    Physical Exam   GEN: Well nourished, well developed elderly female in no acute distress.  HEENT: Grossly normal.  Neck: Supple, no JVD, carotid bruits, or masses. Cardiac: RRR, no murmurs, rubs, or gallops. No clubbing, cyanosis, edema.  Radials/DP/PT 2+ and equal bilaterally.  Respiratory:  Respirations regular and unlabored, clear to auscultation bilaterally. GI: Soft, nontender, nondistended, BS + x 4. MS: no deformity or atrophy. Skin: warm and dry, no rash. Neuro:  Strength and sensation are intact. Psych: AAOx3.  Normal affect.  Labs    Basic Metabolic Panel  Recent Labs  11/17/16 0402 11/18/16 0703  NA 142 141  K 3.9 3.8  CL 97* 101  CO2 34* 32  GLUCOSE 135* 91  BUN 39* PENDING  CREATININE 2.21* 1.88*  CALCIUM 8.2* 7.8*   Liver Function Tests  Recent Labs  11/17/16 0402 11/18/16 0703  AST 21 19  ALT 21 18  ALKPHOS 41 39  BILITOT 0.5 0.5  PROT 5.6* 5.6*  ALBUMIN 2.7* 2.7*    Telemetry    NSR - Personally Reviewed    Radiology    Dg Chest Port 1 View  Result Date: 11/16/2016 CLINICAL DATA:  Shortness of Breath EXAM: PORTABLE CHEST 1 VIEW COMPARISON:  11/12/2016 FINDINGS: Cardiac shadow is stable. Significant decrease in vascular congestion is noted when compared with the prior exam. No focal confluent infiltrate is seen. No acute bony abnormality is noted. IMPRESSION: Significant improvement in vascular congestion. No significant pulmonary edema is noted. Electronically Signed   By: Inez Catalina M.D.   On:  11/16/2016 11:32    Cardiac Studies  Transthoracic Echocardiography 11/11/16 Study Conclusions  - Left ventricle: The cavity size was normal. Wall thickness was   increased increased in a pattern of mild to moderate LVH.   Systolic function was moderately to severely reduced. The   estimated ejection fraction was in the range of 30% to 35%. Wall   motion was normal; there were no regional wall motion   abnormalities. - Mitral valve: There was moderate to severe regurgitation. - Left atrium: The atrium was mildly to moderately dilated. - Pulmonary arteries: Systolic pressure was mildly increased. PA   peak pressure: 34 mm Hg (S).   Patient Profile     Meghan Anderson is a 81 year old female with a past medical history of COPD, CKD, IIA (T3N0M0) rectal adenocarcinoma, S/P resection of rectal mass by Dr. Marcello Moores on 12/14/2015, now S/P concomitant chemoradiation, HLD recently transferred from [facility] on 11/08/16 after presenting there for chest pain and shortness of breath. Troponin 0.05>>0.04>>0.08. Echo with reduced EF of 35% for cath today (had to await correction of renal function).   Assessment & Plan    1. NSVT:Improved Started Mexiletine on 11/10/16. Tolerating well, no VT.  2. Chest pain/NSTEMI: has elevated troponin to 0.05. Echo shows reduced EF of 30-35%. EF was 50-55% in 2016. For cath today.   3. COPD: continued wheezing, per pulmonary.   4. Acute on chronic kidney disease: Creatinine is 1.88today.   5. Lethargy: Resolved   6. Acute on chronic systolic CHF: Euvolemic on exam. Continue hydralazine 37.5 q 8 hours, isosorbide 60mg  daily. No ACE-I/ARB.   7. HCAP: Per primary team.   Signed, Arbutus Leas, NP  11/18/2016, 7:58 AM   I have personally seen and examined this patient with [name], NP. I agree with the assessment and plan as outlined above. Plans for cardiac cath today.   Lauree Chandler 11/18/2016 10:13 AM

## 2016-11-18 NOTE — Interval H&P Note (Signed)
Cath Lab Visit (complete for each Cath Lab visit)  Clinical Evaluation Leading to the Procedure:   ACS: Yes.    Non-ACS:    Anginal Classification: CCS IV  Anti-ischemic medical therapy: Minimal Therapy (1 class of medications)  Non-Invasive Test Results: High-risk stress test findings: cardiac mortality >3%/year  Prior CABG: No previous CABG   Decreased EF by echo.    History and Physical Interval Note:  11/18/2016 10:47 AM  Meghan Anderson  has presented today for surgery, with the diagnosis of unstable angina  The various methods of treatment have been discussed with the patient and family. After consideration of risks, benefits and other options for treatment, the patient has consented to  Procedure(s): Left Heart Cath and Coronary Angiography (N/A) as a surgical intervention .  The patient's history has been reviewed, patient examined, no change in status, stable for surgery.  I have reviewed the patient's chart and labs.  Questions were answered to the patient's satisfaction.     Larae Grooms

## 2016-11-19 DIAGNOSIS — I2510 Atherosclerotic heart disease of native coronary artery without angina pectoris: Secondary | ICD-10-CM

## 2016-11-19 DIAGNOSIS — I5023 Acute on chronic systolic (congestive) heart failure: Secondary | ICD-10-CM

## 2016-11-19 DIAGNOSIS — I428 Other cardiomyopathies: Secondary | ICD-10-CM

## 2016-11-19 LAB — COMPREHENSIVE METABOLIC PANEL
ALK PHOS: 41 U/L (ref 38–126)
ALT: 20 U/L (ref 14–54)
ANION GAP: 9 (ref 5–15)
AST: 19 U/L (ref 15–41)
Albumin: 2.7 g/dL — ABNORMAL LOW (ref 3.5–5.0)
BUN: 41 mg/dL — ABNORMAL HIGH (ref 6–20)
CALCIUM: 8.1 mg/dL — AB (ref 8.9–10.3)
CO2: 29 mmol/L (ref 22–32)
CREATININE: 1.91 mg/dL — AB (ref 0.44–1.00)
Chloride: 105 mmol/L (ref 101–111)
GFR, EST AFRICAN AMERICAN: 28 mL/min — AB (ref >60)
GFR, EST NON AFRICAN AMERICAN: 24 mL/min — AB (ref >60)
Glucose, Bld: 114 mg/dL — ABNORMAL HIGH (ref 65–99)
Potassium: 3.5 mmol/L (ref 3.5–5.1)
SODIUM: 143 mmol/L (ref 135–145)
TOTAL PROTEIN: 5.4 g/dL — AB (ref 6.5–8.1)
Total Bilirubin: 0.3 mg/dL (ref 0.3–1.2)

## 2016-11-19 LAB — CBC
HCT: 25.2 % — ABNORMAL LOW (ref 36.0–46.0)
HEMOGLOBIN: 7.9 g/dL — AB (ref 12.0–15.0)
MCH: 32.2 pg (ref 26.0–34.0)
MCHC: 31.3 g/dL (ref 30.0–36.0)
MCV: 102.9 fL — AB (ref 78.0–100.0)
Platelets: 216 10*3/uL (ref 150–400)
RBC: 2.45 MIL/uL — AB (ref 3.87–5.11)
RDW: 14.7 % (ref 11.5–15.5)
WBC: 5.6 10*3/uL (ref 4.0–10.5)

## 2016-11-19 LAB — GLUCOSE, CAPILLARY: GLUCOSE-CAPILLARY: 87 mg/dL (ref 65–99)

## 2016-11-19 MED ORDER — ASPIRIN EC 81 MG PO TBEC
81.0000 mg | DELAYED_RELEASE_TABLET | Freq: Every day | ORAL | Status: DC
  Administered 2016-11-19 – 2016-11-20 (×2): 81 mg via ORAL
  Filled 2016-11-19 (×2): qty 1

## 2016-11-19 MED ORDER — FUROSEMIDE 10 MG/ML IJ SOLN
40.0000 mg | Freq: Two times a day (BID) | INTRAMUSCULAR | Status: DC
  Administered 2016-11-19 (×2): 40 mg via INTRAVENOUS
  Filled 2016-11-19 (×2): qty 4

## 2016-11-19 MED ORDER — PREDNISONE 10 MG PO TABS
30.0000 mg | ORAL_TABLET | Freq: Every day | ORAL | Status: DC
  Administered 2016-11-20: 09:00:00 30 mg via ORAL
  Filled 2016-11-19: qty 1

## 2016-11-19 MED ORDER — HYDRALAZINE HCL 50 MG PO TABS
50.0000 mg | ORAL_TABLET | Freq: Three times a day (TID) | ORAL | Status: DC
  Administered 2016-11-19 – 2016-11-20 (×3): 50 mg via ORAL
  Filled 2016-11-19 (×3): qty 1

## 2016-11-19 NOTE — Progress Notes (Signed)
[facility] PROGRESS NOTE  Meghan Anderson [id number] DOB: 12/09/1935 DOA: 11/08/2016   PCP: Fransisca Kaufmann Dettinger, MD     Assessment/Plan: Principal Problem:   Acute respiratory failure with hypoxia ([place]) Active Problems:   Essential hypertension, benign   Hyperlipidemia   Hypothyroidism   Chronic renal disease, stage 3, moderately decreased glomerular filtration rate (GFR) between 30-59 mL/min/1.73 square meter   COPD exacerbation (HCC)   Troponin level elevated   Difficulty in walking, not elsewhere classified   SOB (shortness of breath)   Pure hypercholesterolemia   Cardiomyopathy ([place])   Ischemic cardiomyopathy   Meghan Anderson is a 81F with PMH significant for COPD, CKD III, GERD, HLD, HTN, prior rectal adenoca and hypothyroidism. She was admitted to [facility] on 12/29 with complaints of worsening shortness of breath x 1 day. She was dyspneic both with exertion and at rest. She denied associated cough, sputum, fever, chills, chest pain, palpitations. On presentation she was noted to be hypoxemic. She had a mild troponin leak at 0.05.    she had increasing shortness of breath and wheezing, persistent hypertension and telemetry reported arrhythmias (bigeminy, trigeminy, brief runs of V tach). She was transferred to ICU. She was seen by cardiology with bedside echo showing EF ~30%. She was started on lidocaine, given iv lasix and transfer to [name] was recommended. She was also started on BiPAP for work of breathing.   She has been short of breath for several days (since 12/26), but had an acute worsening the morning of 12/29. She had gone to her pulmonologist several days ago to get clearance for back surgery and that physician felt that she was acutely exacerbated and gave her a kenalog shot, prednisone and an antibiotic shot. Despite those interventions, she failed to improve. She is usually maintained on Breo, Spiriva and prn nebs.  Patient is now admitted for acute on  chronic hypoxic respiratory failure again related to CHF and COPD exacerbation  Assessment and plan Acute on chronic hypoxemic respiratory failure, acute COPD exacerbation, now HCAP-improving Multifactorial secondary to newly identified cardiomyopathy Continue steroid taper, resume Breo, Spiriva Continue supplemental oxygen Continue nebulizer treatments CXR 1/2 shows Developing airspace consolidation in the right mid to lower lung, involving at least the right middle lobe, concerning for developing Bronchopneumonia. Received vancomycin/cefepime for HCAP 11/12/16-11/14/16. Blood cultures no growth so far Discontinue vancomycin 11/15/16, continue cefepime until 1/9 Speech therapy evaluation recommends regular diet and thin liquids Currently requiring 2 L of oxygen    Ventricular ectopy/Acute on chronic systolic CHF  [TTE in [id number] w/ EF [id number], diastolic dysfunction] Now LV EF: 30% -   35%  significantly less ventricular ectopy after starting lidocaine.   now on Mexitil  Diuretics/fluids managed by cardiology With EF 30 - 35% cardiology increased  hydralazine at 50 mg q 8 hrs and continue nitrates  . Hold off on ACE inhibitor Scheduled for cardiac cath 1/8 Received Lasix 40 mg IV 1 on 1/6   Chest pain: has elevated troponin to 0.05. Cardiac cath today Mild to moderate scattered coronary artery disease which does not appear to explain her global LV dysfunction. Severe disease only in a single secondary branch. Continue aggressive medical therapy  Acute on chronic kidney disease-baseline around 1.6-1.8, renal function more or less stable for 3 days Cardiology to make recommendations about diuresis Monitor renal function, if stable anticipate discharge tomorrow   Recent rectal adenoca s/p 5FU then Xeloda, currently off treatment  Hypothyroidism Markedly elevated  TSH, free T4 low. Dose of Synthroid increased to 125 g a day Repeat thyroid function tests in 6 weeks  Essential  hypertension Continue Norvasc 10 mg daily  Now on hydralazine, Imdur,  Anemia of chronic disease/microcytic anemia - Baseline Hemoglobin 10.5 Can't have B-12 deficiency, started on B-12 supplement   Dyslipidemia - Continue Lipitor    Hypokalemia-repleted  DVT prophylaxsis heparin  Code Status:  Full code     Family Communication: Discussed in detail with the patient/son/daughter, all imaging results, lab results explained to the patient   Disposition Plan:  Continue telemetry. Still receiving IV diuresis      Consultants:  Cardiology  Critical care  Procedures:  None  Antibiotics: Anti-infectives    Start     Dose/Rate Route Frequency Ordered Stop   11/12/16 1500  ceFEPIme (MAXIPIME) 1 g in dextrose 5 % 50 mL IVPB     1 g 100 mL/hr over 30 Minutes Intravenous Every 24 hours 11/12/16 1352 11/19/16 2359   11/12/16 1500  vancomycin (VANCOCIN) IVPB 1000 mg/200 mL premix  Status:  Discontinued     1,000 mg 200 mL/hr over 60 Minutes Intravenous Every 48 hours 11/12/16 1352 11/15/16 0856         HPI/Subjective: Feels well, denies chest pain and SOB.    Objective: Vitals:   11/18/16 1952 11/19/16 0452 11/19/16 0844 11/19/16 0851  BP: 120/85 (!) 149/80    Pulse: 66 80    Resp: (!) 22 16    Temp: 97.6 F (36.4 C) 98.5 F (36.9 C)    TempSrc: Oral Oral    SpO2: 99% 100% 99% 97%  Weight:  57.2 kg (126 lb 3.2 oz)    Height:        Intake/Output Summary (Last 24 hours) at 11/19/16 0946 Last data filed at 11/19/16 0840  Gross per 24 hour  Intake              360 ml  Output                0 ml  Net              360 ml    Exam:  General Well nourished, well developed, no apparent distress, up to chair  HEENT No gross abnormalities. Oropharynx clear. Mallampati III, good dentition.  Pulmonary Prolonged expiratory phase.  Expiratory wheeze bilateral bases.  Cardiovascular Normal rate, regular rhythm. S1, s2. No m/r/g. Distal pulses palpable.   Abdomen Soft, mild diffuse tenderness to palpation, no guarding or rebound, non-distended, positive bowel sounds, no palpable organomegaly or masses. Normoresonant to percussion.  Musculoskeletal Grossly normal  Lymphatics No cervical, supraclavicular or axillary adenopathy.   Neurologic Grossly intact. No focal deficits.   Skin/Integuement No rash, no cyanosis, no clubbing. No edema.        Data Reviewed: I have personally reviewed following labs and imaging studies  Micro Results Recent Results (from the past 240 hour(s))  Culture, blood (routine x 2)     Status: None   Collection Time: 11/13/16 10:27 AM  Result Value Ref Range Status   Specimen Description BLOOD LEFT ANTECUBITAL  Final   Special Requests BOTTLES DRAWN AEROBIC AND ANAEROBIC  [place]  Final   Culture NO GROWTH 5 DAYS  Final   Report Status 11/18/2016 FINAL  Final  Culture, blood (routine x 2)     Status: None   Collection Time: 11/13/16 10:40 AM  Result Value Ref Range Status   Specimen Description BLOOD  LEFT ANTECUBITAL  Final   Special Requests BOTTLES DRAWN AEROBIC AND ANAEROBIC  5CC  Final   Culture NO GROWTH 5 DAYS  Final   Report Status 11/18/2016 FINAL  Final    Radiology Reports Dg Chest 2 View  Result Date: 11/08/2016 CLINICAL DATA:  81 year old female with chest pain and shortness of breath since yesterday. Initial encounter. Personal history of colon cancer. EXAM: CHEST  2 VIEW COMPARISON:  CT Abdomen and Pelvis 05/22/2016. Chest radiographs 05/22/2016 and earlier. FINDINGS: Upright AP and lateral views of the chest. New small bilateral pleural effusions. Stable mild cardiomegaly. Other mediastinal contours are within normal limits. Visualized tracheal air column is within normal limits. No pneumothorax. No consolidation. Chronic diffuse increased pulmonary interstitium appears not significantly changed. No acute edema suspected. Osteopenia. Calcified aortic atherosclerosis. No acute osseous abnormality  identified. Negative visible bowel gas pattern. IMPRESSION: New small bilateral pleural effusions. No other acute cardiopulmonary abnormality. Electronically Signed   By: Genevie Ann M.D.   On: 11/08/2016 10:57   Dg Chest Port 1 View  Result Date: 11/16/2016 CLINICAL DATA:  Shortness of Breath EXAM: PORTABLE CHEST 1 VIEW COMPARISON:  11/12/2016 FINDINGS: Cardiac shadow is stable. Significant decrease in vascular congestion is noted when compared with the prior exam. No focal confluent infiltrate is seen. No acute bony abnormality is noted. IMPRESSION: Significant improvement in vascular congestion. No significant pulmonary edema is noted. Electronically Signed   By: Inez Catalina M.D.   On: 11/16/2016 11:32   Dg Abd Portable 1v  Result Date: 11/09/2016 CLINICAL DATA:  Impaired nasogastric feeding tube EXAM: PORTABLE ABDOMEN - 1 VIEW COMPARISON:  CT from 05/22/2016, lumbar spine CT 10/14/2016 FINDINGS: The tip and side port of a nasogastric tube is seen in the left upper quadrant in the expected location of the stomach. Scattered gas containing small large bowel loops in a nonobstructive pattern. Levoscoliosis with lumbar spondylosis of the dorsal spine. Urinary probe projects over the mid pelvis. Mild sclerosis about the sacroiliac joints bilaterally. Patient has sacral insufficiency fractures by CT. IMPRESSION: Gastric tube in the expected location the stomach. No bowel obstruction or free air. Levoconvex curvature of the lumbar spine with spondylosis. Electronically Signed   By: Ashley Royalty M.D.   On: 11/09/2016 23:28   Dg Chest Port 1 View  Result Date: 11/12/2016 CLINICAL DATA:  81 year old female with history of COPD presenting with low oxygen saturations. Congestion. Shortness of breath. Wheezing. EXAM: PORTABLE CHEST 1 VIEW COMPARISON:  Chest x-ray 11/08/2016. FINDINGS: New airspace consolidation in the perihilar aspect of the right lung partially obscuring the right heart border, concerning for  developing bronchopneumonia likely involving the right middle lobe. Small bilateral pleural effusions. Cephalization of the pulmonary vasculature, without frank pulmonary edema. Mild cardiomegaly. The patient is rotated to the left on today's exam, resulting in distortion of the mediastinal contours and reduced diagnostic sensitivity and specificity for mediastinal pathology. Atherosclerosis in the thoracic aorta. IMPRESSION: 1. Developing airspace consolidation in the right mid to lower lung, involving at least the right middle lobe, concerning for developing bronchopneumonia. Followup PA and lateral chest X-ray is recommended in 3-4 weeks following trial of antibiotic therapy to ensure resolution and exclude underlying malignancy. 2. Cardiomegaly with pulmonary venous congestion, but no frank pulmonary edema. 3. Small bilateral pleural effusions. 4. Aortic atherosclerosis. Electronically Signed   By: Vinnie Langton M.D.   On: 11/12/2016 12:27     CBC  Recent Labs Lab 11/13/16 0625 11/14/16 0545 11/18/16 0703 11/18/16 1320 11/19/16  0557  WBC 4.8 5.3 5.2 6.6 5.6  HGB 8.6* 8.7* 8.6* 8.7* 7.9*  HCT 26.6* 27.2* 26.1* 26.7* 25.2*  PLT 204 188 203 227 216  MCV 101.1* 101.9* 101.6* 102.3* 102.9*  MCH 32.7 32.6 33.5 33.3 32.2  MCHC 32.3 32.0 33.0 32.6 31.3  RDW 14.8 14.7 14.5 14.7 14.7    Chemistries   Recent Labs Lab 11/12/16 2040  11/14/16 0545 11/15/16 0532 11/16/16 0605 11/17/16 0402 11/18/16 0703 11/18/16 1320 11/19/16 0557  NA  --   < > 139 139 140 142 141  --  143  K  --   < > 4.4 4.1 3.9 3.9 3.8  --  3.5  CL  --   < > 98* 98* 99* 97* 101  --  105  CO2  --   < > 33* 32 33* 34* 32  --  29  GLUCOSE  --   < > 164* 133* 103* 135* 91  --  114*  BUN  --   < > 30* 32* 36* 39* 36*  --  41*  CREATININE  --   < > 2.07* 1.96* 2.07* 2.21* 1.88* 1.91* 1.91*  CALCIUM  --   < > 7.5* 7.5* 7.8* 8.2* 7.8*  --  8.1*  MG 2.0  --   --   --   --   --   --   --   --   AST  --   < > 31 30  --   21 19  --  19  ALT  --   < > 27 24  --  21 18  --  20  ALKPHOS  --   < > 46 43  --  41 39  --  41  BILITOT  --   < > 0.4 0.5  --  0.5 0.5  --  0.3  < > = values in this interval not displayed. ------------------------------------------------------------------------------------------------------------------ estimated creatinine clearance is 18.3 mL/min (by C-G formula based on SCr of 1.91 mg/dL (H)). ------------------------------------------------------------------------------------------------------------------ No results for input(s): HGBA1C in the last 72 hours. ------------------------------------------------------------------------------------------------------------------ No results for input(s): CHOL, HDL, LDLCALC, TRIG, CHOLHDL, LDLDIRECT in the last 72 hours. ------------------------------------------------------------------------------------------------------------------ No results for input(s): TSH, T4TOTAL, T3FREE, THYROIDAB in the last 72 hours.  Invalid input(s): FREET3 ------------------------------------------------------------------------------------------------------------------ No results for input(s): VITAMINB12, FOLATE, FERRITIN, TIBC, IRON, RETICCTPCT in the last 72 hours.  Coagulation profile  Recent Labs Lab 11/18/16 0703  INR 1.03    No results for input(s): DDIMER in the last 72 hours.  Cardiac Enzymes  Recent Labs Lab 11/12/16 1350  TROPONINI 0.08*   ------------------------------------------------------------------------------------------------------------------ Invalid input(s): POCBNP   CBG:  Recent Labs Lab 11/15/16 0746 11/16/16 1010 11/17/16 0606 11/18/16 0745 11/19/16 0734  GLUCAP 109* 86 97 90 87       Studies: No results found.    No results found for: HGBA1C Lab Results  Component Value Date   LDLCALC 171 (H) 07/05/2016   CREATININE 1.91 (H) 11/19/2016       Scheduled Meds: . amLODipine  10 mg Oral Daily  .  atorvastatin  40 mg Oral q1800  . ceFEPime (MAXIPIME) IV  1 g Intravenous Q24H  . fluticasone furoate-vilanterol  1 puff Inhalation Daily  . furosemide  40 mg Intravenous BID  . heparin  5,000 Units Subcutaneous Q8H  . hydrALAZINE  50 mg Oral Q8H  . isosorbide mononitrate  60 mg Oral Daily  . levalbuterol  0.63 mg Nebulization BID  . levothyroxine  125 mcg Oral QAC breakfast  . magnesium oxide  400 mg Oral BID  . mexiletine  200 mg Oral Q12H  . pantoprazole  40 mg Oral Daily  . [START ON 11/20/2016] predniSONE  30 mg Oral Q breakfast  . sodium chloride flush  3 mL Intravenous Q12H  . tiotropium  18 mcg Inhalation Daily  . vitamin B-12  1,000 mcg Oral Daily   Continuous Infusions:    LOS: 11 days    Time spent: >30 MINS    [facility]  [facility] Pager [phone]. If 7PM-7AM, please contact night-coverage at [email or web address], password [facility] 11/19/2016, 9:46 AM  LOS: 11 days

## 2016-11-19 NOTE — Progress Notes (Signed)
Physical Therapy Treatment Patient Details Name: Meghan Anderson MRN: [id number] DOB: Jun 02, 1936 Today's Date: 2016/12/18    History of Present Illness HPI: Meghan Anderson is a 81 y.o. female with medical history significant for hypothyroidism, GERD, COPD, chronic kidney disease stage III. Patient presented with worsening shortness of breath started for past 1 day prior to this admission. Patient reported feeling short of breath at rest and with exertion.     PT Comments    Pt continues to be min guard/supervision with mobility post procedure. Pt gait on room air but spO2 dropped to 86%, may need supplemental O2 at home for mobility.  Pt spO2 returned quickly to >90% with 2LO2 and seated rest.   Follow Up Recommendations  Home health PT;Supervision/Assistance - 24 hour     Equipment Recommendations  None recommended by PT    Recommendations for Other Services       Precautions / Restrictions Precautions Precautions: Fall Restrictions Weight Bearing Restrictions: No    Mobility  Bed Mobility         Supine to sit: Supervision;HOB elevated     General bed mobility comments: increased time, use of rail, no physical assist needed  Transfers   Equipment used: Rolling walker (2 wheeled) Transfers: Sit to/from Stand Sit to Stand: Min guard         General transfer comment: Cues for hand placement/technique  Ambulation/Gait Ambulation/Gait assistance: Supervision Ambulation Distance (Feet): 175 Feet Assistive device: Rolling walker (2 wheeled) Gait Pattern/deviations: Trunk flexed;Decreased stride length;Step-through pattern Gait velocity: decreased   General Gait Details: pt ambulated on room air with spO2 starting at 99%, down to 86% after 175'. Pt able to return to spO2 >90% with 30 seconds seated rest and 2LO2 [place]   Stairs            Wheelchair Mobility    Modified Rankin (Stroke Patients Only)       Balance                                     Cognition Arousal/Alertness: Awake/alert Behavior During Therapy: WFL for tasks assessed/performed Overall Cognitive Status: Within Functional Limits for tasks assessed                      Exercises      General Comments        Pertinent Vitals/Pain Pain Assessment: No/denies pain    Home Living                      Prior Function            PT Goals (current goals can now be found in the care plan section) Progress towards PT goals: Progressing toward goals    Frequency    Min 3X/week      PT Plan Current plan remains appropriate    Co-evaluation             End of Session Equipment Utilized During Treatment: Gait belt;Oxygen Activity Tolerance: Patient tolerated treatment well Patient left: in bed;with call bell/phone within reach;with family/visitor present     Time: 1005-1020 PT Time Calculation (min) (ACUTE ONLY): 15 min  Charges:  $Gait Training: 8-22 mins                    G Codes:      Jimesha Rising 12-18-2016, 10:23 AM

## 2016-11-19 NOTE — Progress Notes (Signed)
OT Cancellation Note  Patient Details Name: DELONNA LESSING MRN: [id number] DOB: 1936/08/18   Cancelled Treatment:    Reason Eval/Treat Not Completed: OT screened, no needs identified, will sign off.  Pt with initial evaluation for OT and discharge on 11/11/16 with pt at supervision level.  Per PT notes she's at the same functional level with no significant change to warrant re-eval or further treatment at this time.    Juri Dinning OTR/L 11/19/2016, 12:18 PM

## 2016-11-19 NOTE — Progress Notes (Addendum)
Patient Name: Meghan Anderson Date of Encounter: 11/19/2016  Primary Cardiologist: New. Dr. Ladonna Snide Problem List     Principal Problem:   Acute respiratory failure with hypoxia [facility]) Active Problems:   Essential hypertension, benign   Hyperlipidemia   Hypothyroidism   Chronic renal disease, stage 3, moderately decreased glomerular filtration rate (GFR) between 30-59 mL/min/1.73 square meter   COPD exacerbation (HCC)   Troponin level elevated   Difficulty in walking, not elsewhere classified   SOB (shortness of breath)   Pure hypercholesterolemia   Cardiomyopathy ([place])   Ischemic cardiomyopathy     Subjective   Feels great, denies chest pain and SOB.   Inpatient Medications    Scheduled Meds: . amLODipine  10 mg Oral Daily  . atorvastatin  40 mg Oral q1800  . ceFEPime (MAXIPIME) IV  1 g Intravenous Q24H  . fluticasone furoate-vilanterol  1 puff Inhalation Daily  . heparin  5,000 Units Subcutaneous Q8H  . hydrALAZINE  37.5 mg Oral Q8H  . isosorbide mononitrate  60 mg Oral Daily  . levalbuterol  0.63 mg Nebulization BID  . levothyroxine  125 mcg Oral QAC breakfast  . magnesium oxide  400 mg Oral BID  . mexiletine  200 mg Oral Q12H  . pantoprazole  40 mg Oral Daily  . predniSONE  40 mg Oral Q breakfast  . sodium chloride flush  3 mL Intravenous Q12H  . tiotropium  18 mcg Inhalation Daily  . vitamin B-12  1,000 mcg Oral Daily   Continuous Infusions:  PRN Meds: sodium chloride, [DISCONTINUED] acetaminophen **OR** acetaminophen, acetaminophen, bisacodyl, guaiFENesin-dextromethorphan, hydrALAZINE, levalbuterol, ondansetron (ZOFRAN) IV, ondansetron **OR** [DISCONTINUED] ondansetron (ZOFRAN) IV, polyethylene glycol, sodium chloride flush, temazepam   Vital Signs    Vitals:   11/18/16 1300 11/18/16 1807 11/18/16 1952 11/19/16 0452  BP:  136/74 120/85 (!) 149/80  Pulse: 69  66 80  Resp: 17  (!) 22 16  Temp: 98.1 F (36.7 C)  97.6 F (36.4 C) 98.5 F (36.9  C)  TempSrc: Oral  Oral Oral  SpO2: 98%  99% 100%  Weight:    126 lb 3.2 oz (57.2 kg)  Height:        Intake/Output Summary (Last 24 hours) at 11/19/16 0812 Last data filed at 11/18/16 1847  Gross per 24 hour  Intake              240 ml  Output                0 ml  Net              240 ml   Filed Weights   11/17/16 0500 11/18/16 0500 11/19/16 0452  Weight: 123 lb 8 oz (56 kg) 122 lb 1.6 oz (55.4 kg) 126 lb 3.2 oz (57.2 kg)    Physical Exam   GEN: Well nourished, well developed, in no acute distress.  HEENT: Grossly normal.  Neck: Supple, no JVD, carotid bruits, or masses. Cardiac: RRR, no murmurs, rubs, or gallops. No clubbing, cyanosis, edema.  Radials/DP/PT 2+ and equal bilaterally.  Respiratory:  Respirations regular and unlabored, diminished in bases.  GI: Soft, nontender, nondistended, BS + x 4. MS: no deformity or atrophy. Skin: warm and dry, no rash. Neuro:  Strength and sensation are intact. Psych: AAOx3.  Normal affect.  Labs    CBC  Recent Labs  11/18/16 1320 11/19/16 0557  WBC 6.6 5.6  HGB 8.7* 7.9*  HCT 26.7* 25.2*  MCV  102.3* 102.9*  PLT 227 [id number]   Basic Metabolic Panel  Recent Labs  11/18/16 0703 11/18/16 1320 11/19/16 0557  NA 141  --  143  K 3.8  --  3.5  CL 101  --  105  CO2 32  --  29  GLUCOSE 91  --  114*  BUN 36*  --  41*  CREATININE 1.88* 1.91* 1.91*  CALCIUM 7.8*  --  8.1*   Liver Function Tests  Recent Labs  11/18/16 0703 11/19/16 0557  AST 19 19  ALT 18 20  ALKPHOS 39 41  BILITOT 0.5 0.3  PROT 5.6* 5.4*  ALBUMIN 2.7* 2.7*     Telemetry     NSR- Personally Reviewed    Radiology    No results found.  Cardiac Studies  Echo 11/11/16 Study Conclusions  - Left ventricle: The cavity size was normal. Wall thickness was   increased increased in a pattern of mild to moderate LVH.   Systolic function was moderately to severely reduced. The   estimated ejection fraction was in the range of 30% to 35%. Wall    motion was normal; there were no regional wall motion   abnormalities. - Mitral valve: There was moderate to severe regurgitation. - Left atrium: The atrium was mildly to moderately dilated. - Pulmonary arteries: Systolic pressure was mildly increased. PA   peak pressure: 34 mm Hg (S).  Left Heart Cath and Coronary Angiography  Right Heart Cath    LV end diastolic pressure is mildly elevated.  Ost 2nd Diag lesion, 50 %stenosed.  Mid LAD lesion, 25 %stenosed.  Prox LAD to Mid LAD lesion, 50 %stenosed.  Lat 2nd Diag lesion, 90 %stenosed. This is a small branch vessel which would not be the cause of global LV dysfunction.  Ost Cx to Prox Cx lesion, 25 %stenosed.  Mid RCA lesion, 25 %stenosed.  There is no pulmonic valve stenosis.  PA sat 79% on oxygen facemask. CO 8.6 L/min. CI 5.6.   Mild to moderate scattered coronary artery disease which does not appear to explain her global LV dysfunction. Severe disease only in a single secondary branch. Continue aggressive medical therapy. Right heart pressures did not appear to be elevated. She did desaturate during the procedure which required supplemental oxygen and likely affected our saturations.  There was difficulty navigating the right radial artery. Selective right radial angiography showed some tortuosity. Coronary wire was required. If emergent cardiac cath was needed in the future, would avoid right radial approach.  Continue medical therapy for cardiomyopathy.   Patient Profile     Ms. Meghan Anderson is a 81 year old female with a past medical history of COPD, CKD, IIA (T3N0M0) rectal adenocarcinoma, S/P resection of rectal mass by Dr. Marcello Anderson on 12/14/2015, now S/P concomitant chemoradiation, HLD recently transferred from [facility] on 11/08/16 after presenting there for chest pain and shortness of breath. Troponin 0.05>>0.04>>0.08. Echo with reduced EF of 35%, Cath yesterday with no obstructive CAD.   Assessment & Plan  1.  NSVT:ImprovedStarted Mexiletine on 11/10/16. Tolerating well, no VT.  2. Chest pain/elevated troponin: has elevated troponin to 0.05. Echo shows reduced EF of 30-35%. EF was 50-55% in 2016.   Cath report above. 2nd diagonal was 95% stenosed, but this vessel is too small for her global reduced LV function. Will treat medically.   3. COPD: Chronic, controlled on current regimen.   4. Acute on chronic kidney disease: Creatinine is 1.91. Stable post procedure.   5. Acute on  chronic systolic CHF/Non-ischemic cardiomyopathy: Euvolemic on exam. Continue hydralazine 37.5 q 8 hours, isosorbide 60mg  daily. No ACE-I/ARB with renal insufficiency. Plenty of BP, would increase hydralazine to 50mg  TID. LVEDP was 19 at cath. Will give IV diuresis today.   7. HCAP: Treatment per primary team.   She is stable from a cardiac standpoint.    Signed, Arbutus Leas, NP  11/19/2016, 8:12 AM   I have personally seen and examined this patient with [name], NP. I agree with the assessment and plan as outlined above. Cardiac cath with moderate non-obstructive disease in the major epicardial vessels. Will plan medical management of CAD. Will start ASA. Will continue statin. No beta blocker with lung disease. No ARB with renal insufficiency. Diurese today given elevation LVEDP during cath. She may be ready for d/c tomorrow.   Lauree Chandler 11/19/2016 9:54 AM

## 2016-11-19 NOTE — Progress Notes (Signed)
Pt has been off 02 since lunch time. Will ambulate off of 02 tomorrow prior to discharge

## 2016-11-19 NOTE — Progress Notes (Signed)
SATURATION QUALIFICATIONS: (This note is used to comply with regulatory documentation for home oxygen)  Patient Saturations on Room Air at Rest = 86%  Patient Saturations on Room Air while Ambulating = 90%  Patient Saturations on 1.5 Liters of oxygen while Ambulating = 95%  Please briefly explain why patient needs home oxygen:

## 2016-11-20 ENCOUNTER — Other Ambulatory Visit: Payer: Self-pay | Admitting: Family Medicine

## 2016-11-20 LAB — COMPREHENSIVE METABOLIC PANEL
ALBUMIN: 2.8 g/dL — AB (ref 3.5–5.0)
ALT: 18 U/L (ref 14–54)
AST: 16 U/L (ref 15–41)
Alkaline Phosphatase: 40 U/L (ref 38–126)
Anion gap: 9 (ref 5–15)
BUN: 42 mg/dL — AB (ref 6–20)
CHLORIDE: 101 mmol/L (ref 101–111)
CO2: 33 mmol/L — AB (ref 22–32)
CREATININE: 2 mg/dL — AB (ref 0.44–1.00)
Calcium: 8.4 mg/dL — ABNORMAL LOW (ref 8.9–10.3)
GFR calc Af Amer: 26 mL/min — ABNORMAL LOW (ref >60)
GFR calc non Af Amer: 23 mL/min — ABNORMAL LOW (ref >60)
Glucose, Bld: 94 mg/dL (ref 65–99)
POTASSIUM: 3.5 mmol/L (ref 3.5–5.1)
SODIUM: 143 mmol/L (ref 135–145)
Total Bilirubin: 0.3 mg/dL (ref 0.3–1.2)
Total Protein: 5.7 g/dL — ABNORMAL LOW (ref 6.5–8.1)

## 2016-11-20 LAB — CBC
HCT: 25 % — ABNORMAL LOW (ref 36.0–46.0)
Hemoglobin: 7.9 g/dL — ABNORMAL LOW (ref 12.0–15.0)
MCH: 32 pg (ref 26.0–34.0)
MCHC: 31.6 g/dL (ref 30.0–36.0)
MCV: 101.2 fL — ABNORMAL HIGH (ref 78.0–100.0)
PLATELETS: 238 10*3/uL (ref 150–400)
RBC: 2.47 MIL/uL — AB (ref 3.87–5.11)
RDW: 14.6 % (ref 11.5–15.5)
WBC: 6.7 10*3/uL (ref 4.0–10.5)

## 2016-11-20 LAB — TYPE AND SCREEN
ABO/RH(D): B POS
Antibody Screen: NEGATIVE

## 2016-11-20 LAB — ABO/RH: ABO/RH(D): B POS

## 2016-11-20 LAB — GLUCOSE, CAPILLARY: GLUCOSE-CAPILLARY: 99 mg/dL (ref 65–99)

## 2016-11-20 MED ORDER — FUROSEMIDE 20 MG PO TABS
20.0000 mg | ORAL_TABLET | Freq: Two times a day (BID) | ORAL | Status: DC
  Administered 2016-11-20: 20 mg via ORAL
  Filled 2016-11-20: qty 1

## 2016-11-20 MED ORDER — POLYETHYLENE GLYCOL 3350 17 G PO PACK: 17.0000 g | PACK | Freq: Every day | ORAL | 0 refills | Status: DC | PRN

## 2016-11-20 MED ORDER — MAGNESIUM OXIDE 400 (241.3 MG) MG PO TABS: 400.0000 mg | ORAL_TABLET | Freq: Two times a day (BID) | ORAL | 3 refills | Status: AC

## 2016-11-20 MED ORDER — CYANOCOBALAMIN 1000 MCG PO TABS: 1000.0000 ug | ORAL_TABLET | Freq: Every day | ORAL | 2 refills | Status: AC

## 2016-11-20 MED ORDER — HYDRALAZINE HCL 50 MG PO TABS: 50.0000 mg | ORAL_TABLET | Freq: Three times a day (TID) | ORAL | 3 refills | Status: DC

## 2016-11-20 MED ORDER — LEVOTHYROXINE SODIUM 125 MCG PO TABS
125.0000 ug | ORAL_TABLET | Freq: Every day | ORAL | 2 refills | Status: DC
Start: 2016-11-21 — End: 2017-02-24

## 2016-11-20 MED ORDER — PREDNISONE 20 MG PO TABS: 20.0000 mg | ORAL_TABLET | Freq: Every day | ORAL | 0 refills | Status: AC

## 2016-11-20 MED ORDER — FUROSEMIDE 20 MG PO TABS: 20.0000 mg | ORAL_TABLET | Freq: Two times a day (BID) | ORAL | 2 refills | Status: DC

## 2016-11-20 MED ORDER — ISOSORBIDE MONONITRATE ER 60 MG PO TB24: 60.0000 mg | ORAL_TABLET | Freq: Every day | ORAL | 2 refills | Status: DC

## 2016-11-20 MED ORDER — MEXILETINE HCL 200 MG PO CAPS: 200.0000 mg | ORAL_CAPSULE | Freq: Two times a day (BID) | ORAL | 2 refills | Status: DC

## 2016-11-20 MED ORDER — ASPIRIN 81 MG PO TBEC: 81.0000 mg | DELAYED_RELEASE_TABLET | Freq: Every day | ORAL | 2 refills | Status: AC

## 2016-11-20 NOTE — Progress Notes (Signed)
Patient Name: Meghan Anderson Date of Encounter: 11/20/2016  Primary Cardiologist: [facility], Dr. [facility] Problem List     Principal Problem:   Acute respiratory failure with hypoxia [facility]) Active Problems:   Essential hypertension, benign   Hyperlipidemia   Hypothyroidism   Chronic renal disease, stage 3, moderately decreased glomerular filtration rate (GFR) between 30-59 mL/min/1.73 square meter   COPD exacerbation (HCC)   Elevated troponin   Difficulty in walking, not elsewhere classified   SOB (shortness of breath)   Pure hypercholesterolemia   Non-ischemic cardiomyopathy ([place])   Ischemic cardiomyopathy   Coronary artery disease involving native coronary artery of native heart without angina pectoris   Acute on chronic systolic CHF (congestive heart failure) ([place])     Subjective   Feels well, says she wants to go home. Breathing is much better.   Inpatient Medications    Scheduled Meds: . amLODipine  10 mg Oral Daily  . aspirin EC  81 mg Oral Daily  . atorvastatin  40 mg Oral q1800  . fluticasone furoate-vilanterol  1 puff Inhalation Daily  . furosemide  20 mg Oral BID  . heparin  5,000 Units Subcutaneous Q8H  . hydrALAZINE  50 mg Oral Q8H  . isosorbide mononitrate  60 mg Oral Daily  . levalbuterol  0.63 mg Nebulization BID  . levothyroxine  125 mcg Oral QAC breakfast  . magnesium oxide  400 mg Oral BID  . mexiletine  200 mg Oral Q12H  . pantoprazole  40 mg Oral Daily  . predniSONE  30 mg Oral Q breakfast  . sodium chloride flush  3 mL Intravenous Q12H  . tiotropium  18 mcg Inhalation Daily  . vitamin B-12  1,000 mcg Oral Daily   Continuous Infusions:  PRN Meds: sodium chloride, [DISCONTINUED] acetaminophen **OR** acetaminophen, acetaminophen, bisacodyl, guaiFENesin-dextromethorphan, hydrALAZINE, levalbuterol, ondansetron (ZOFRAN) IV, ondansetron **OR** [DISCONTINUED] ondansetron (ZOFRAN) IV, polyethylene glycol, sodium chloride flush, temazepam   Vital  Signs    Vitals:   11/19/16 2009 11/19/16 2014 11/20/16 0500 11/20/16 0908  BP:  131/75 (!) 146/88   Pulse:  79 73   Resp:  (!) 27 15   Temp:  98.2 F (36.8 C) 98 F (36.7 C)   TempSrc:  Oral Oral   SpO2: 93% 92% 92% 96%  Weight:   125 lb 6.4 oz (56.9 kg)   Height:        Intake/Output Summary (Last 24 hours) at 11/20/16 0910 Last data filed at 11/20/16 0600  Gross per 24 hour  Intake              840 ml  Output                0 ml  Net              840 ml   Filed Weights   11/18/16 0500 11/19/16 0452 11/20/16 0500  Weight: 122 lb 1.6 oz (55.4 kg) 126 lb 3.2 oz (57.2 kg) 125 lb 6.4 oz (56.9 kg)    Physical Exam    GEN: Well nourished, well developed, in no acute distress.  HEENT: Grossly normal.  Neck: Supple, no JVD, carotid bruits, or masses. Cardiac: RRR, no murmurs, rubs, or gallops. No clubbing, cyanosis, edema.  Radials/DP/PT 2+ and equal bilaterally.  Respiratory:  Respirations regular and unlabored, diminished in bases.  GI: Soft, nontender, nondistended, BS + x 4. MS: no deformity or atrophy. Skin: warm and dry, no rash. Neuro:  Strength and  sensation are intact. Psych: AAOx3.  Normal affect.  Labs    CBC  Recent Labs  11/19/16 0557 11/20/16 0619  WBC 5.6 6.7  HGB 7.9* 7.9*  HCT 25.2* 25.0*  MCV 102.9* 101.2*  PLT 216 [id number]   Basic Metabolic Panel  Recent Labs  11/19/16 0557 11/20/16 0619  NA 143 143  K 3.5 3.5  CL 105 101  CO2 29 33*  GLUCOSE 114* 94  BUN 41* 42*  CREATININE 1.91* 2.00*  CALCIUM 8.1* 8.4*   Liver Function Tests  Recent Labs  11/19/16 0557 11/20/16 0619  AST 19 16  ALT 20 18  ALKPHOS 41 40  BILITOT 0.3 0.3  PROT 5.4* 5.7*  ALBUMIN 2.7* 2.8*     Telemetry    NSR - Personally Reviewed    Radiology    No results found.  Cardiac Studies   Echo 11/11/16 Study Conclusions  - Left ventricle: The cavity size was normal. Wall thickness was increased increased in a pattern of mild to moderate  LVH. Systolic function was moderately to severely reduced. The estimated ejection fraction was in the range of 30% to 35%. Wall motion was normal; there were no regional wall motion abnormalities. - Mitral valve: There was moderate to severe regurgitation. - Left atrium: The atrium was mildly to moderately dilated. - Pulmonary arteries: Systolic pressure was mildly increased. PA peak pressure: 34 mm Hg (S).  Left Heart Cath and Coronary Angiography  Right Heart Cath    LV end diastolic pressure is mildly elevated.  Ost 2nd Diag lesion, 50 %stenosed.  Mid LAD lesion, 25 %stenosed.  Prox LAD to Mid LAD lesion, 50 %stenosed.  Lat 2nd Diag lesion, 90 %stenosed. This is a small branch vessel which would not be the cause of global LV dysfunction.  Ost Cx to Prox Cx lesion, 25 %stenosed.  Mid RCA lesion, 25 %stenosed.  There is no pulmonic valve stenosis.  PA sat 79% on oxygen facemask. CO 8.6 L/min. CI 5.6.  Mild to moderate scattered coronary artery disease which does not appear to explain her global LV dysfunction. Severe disease only in a single secondary branch. Continue aggressive medical therapy. Right heart pressures did not appear to be elevated. She did desaturate during the procedure which required supplemental oxygen and likely affected our saturations.  There was difficulty navigating the right radial artery. Selective right radial angiography showed some tortuosity. Coronary wire was required. If emergent cardiac cath was needed in the future, would avoid right radial approach.  Continue medical therapy for cardiomyopathy.   Patient Profile     Meghan Anderson is a 81 year old female with a past medical history of COPD, CKD, IIA (T3N0M0) rectal adenocarcinoma, S/P resection of rectal mass by Dr. Marcello Moores on 12/14/2015, now S/P concomitant chemoradiation, HLD recently transferred from [facility] on 12/29/17after presenting there for chest pain and shortness of  breath. Troponin 0.05>>0.04>>0.08. Echo with reduced EF of 35%, Cath yesterday with no obstructive CAD.     Assessment & Plan    1. NSVT:ImprovedStarted Mexiletine on 11/10/16. Tolerating well, no VT.  2. Chest pain/elevated troponin: has elevated troponin to 0.05. Echo shows reduced EF of 30-35%. EF was 50-55% in 2016.   Cath report above. 2nd diagonal was 95% stenosed, but this vessel is too small for her global reduced LV function. Will treat medically.   3. COPD: Chronic, controlled on current regimen.   4. Acute on chronic kidney disease: Creatinine is 1.91. Stable post procedure.  5. Acute on chronic systolic CHF/Non-ischemic cardiomyopathy: Euvolemic on exam. Continue hydralazine 50 q 8 hours, isosorbide 60mg  daily. No ACE-I/ARB with renal insufficiency.  LVEDP was 19 at cath. Diuresed well on IV lasix, will send home on po Lasix 20mg  BID.   7. HCAP: Treatment per primary team.   She is stable from a cardiac standpoint for discharge. Will make follow up appt.     Signed, Arbutus Leas, NP  11/20/2016, 9:10 AM

## 2016-11-20 NOTE — Evaluation (Signed)
Physical Therapy Evaluation Patient Details Name: Meghan Anderson MRN: [id number] DOB: 25-Jul-1936 Today's Date: 11/20/2016   History of Present Illness  HPI: Meghan Anderson is a 81 y.o. female with medical history significant for hypothyroidism, GERD, COPD, chronic kidney disease stage III. Patient presented with worsening shortness of breath started for past 1 day prior to this admission. Patient reported feeling short of breath at rest and with exertion.   Clinical Impression  Pt o2 in 90's with mobility today on room air, although son said her o2 drops during the night and they were up all night.  Discussed with nursing.  Pt does have 2nd floor and son states she likes to go up there.  Pt did 3 stairs and was fatigued and verbalized she did realize it was not safe for her to try by herself.  Recommend HHPT.    Follow Up Recommendations Home health PT;Supervision/Assistance - 24 hour    Equipment Recommendations  None recommended by PT    Recommendations for Other Services       Precautions / Restrictions Precautions Precautions: Fall Restrictions Weight Bearing Restrictions: No      Mobility  Bed Mobility         Supine to sit: Supervision;HOB elevated     General bed mobility comments: HOB elevated  Transfers Overall transfer level: Needs assistance Equipment used: Rolling walker (2 wheeled) Transfers: Sit to/from Stand Sit to Stand: Supervision            Ambulation/Gait Ambulation/Gait assistance: Supervision;Min guard Ambulation Distance (Feet): 190 Feet Assistive device: Rolling walker (2 wheeled) Gait Pattern/deviations: Trunk flexed;Decreased stride length;Step-through pattern Gait velocity: decreased   General Gait Details: Ambulated on room air with o2 sats in 90's  Stairs Stairs: Yes Stairs assistance: Min assist Stair Management: One rail Left;Step to pattern Number of Stairs: 3 General stair comments: rail on L and PT supported R  UE/forearm which son states is way they did at home. Fatigued with steps o2 90%  Wheelchair Mobility    Modified Rankin (Stroke Patients Only)       Balance Overall balance assessment: Needs assistance Sitting-balance support: Feet supported;No upper extremity supported Sitting balance-Leahy Scale: Good     Standing balance support: During functional activity Standing balance-Leahy Scale: Poor Standing balance comment: requires UE support                             Pertinent Vitals/Pain Pain Assessment: No/denies pain    Home Living                        Prior Function                 Hand Dominance        Extremity/Trunk Assessment                Communication      Cognition Arousal/Alertness: Awake/alert Behavior During Therapy: WFL for tasks assessed/performed Overall Cognitive Status: Within Functional Limits for tasks assessed                      General Comments      Exercises General Exercises - Lower Extremity Ankle Circles/Pumps: Both;10 reps Long Arc Quad: Both;5 reps Hip Flexion/Marching: Both;5 reps   Assessment/Plan    PT Assessment    PT Problem List            PT  Treatment Interventions      PT Goals (Current goals can be found in the Care Plan section)  Acute Rehab PT Goals Patient Stated Goal: home today PT Goal Formulation: With patient Time For Goal Achievement: 11/25/16 Potential to Achieve Goals: Good    Frequency Min 3X/week   Barriers to discharge        Co-evaluation               End of Session Equipment Utilized During Treatment: Gait belt Activity Tolerance: Patient tolerated treatment well Patient left: in bed;with call bell/phone within reach;with family/visitor present Nurse Communication: Mobility status         Time: 1001-1016 PT Time Calculation (min) (ACUTE ONLY): 15 min   Charges:     PT Treatments $Gait Training: 8-22 mins   PT G Codes:         Trina Asch LUBECK 11/20/2016, 10:35 AM

## 2016-11-20 NOTE — Discharge Summary (Signed)
Physician Discharge Summary  Meghan Anderson MRN: [id number] DOB/AGE: 79-Mar-1937 79 y.o.  PCP: [name], MD   Admit date: 11/08/2016 Discharge date: 11/20/2016  Discharge Diagnoses:    Principal Problem:   Acute respiratory failure with hypoxia [facility]) Active Problems:   Essential hypertension, benign   Hyperlipidemia   Hypothyroidism   Chronic renal disease, stage 3, moderately decreased glomerular filtration rate (GFR) between 30-59 mL/min/1.73 square meter   COPD exacerbation (HCC)   Elevated troponin   Difficulty in walking, not elsewhere classified   SOB (shortness of breath)   Pure hypercholesterolemia   Non-ischemic cardiomyopathy ([place])   Ischemic cardiomyopathy   Coronary artery disease involving native coronary artery of native heart without angina pectoris   Acute on chronic systolic CHF (congestive heart failure) ([place])    Follow-up recommendations Follow-up with PCP in 3-5 days , including all  additional recommended appointments as below Follow-up CBC, CMP in 3-5 days Patient is being discharged with home health Repeat thyroid function tests in 6 weeks     Current Discharge Medication List    START taking these medications   Details  aspirin EC 81 MG EC tablet Take 1 tablet (81 mg total) by mouth daily. Qty: 30 tablet, Refills: 2    furosemide (LASIX) 20 MG tablet Take 1 tablet (20 mg total) by mouth 2 (two) times daily. Qty: 60 tablet, Refills: 2    hydrALAZINE (APRESOLINE) 50 MG tablet Take 1 tablet (50 mg total) by mouth every 8 (eight) hours. Qty: 90 tablet, Refills: 3    isosorbide mononitrate (IMDUR) 60 MG 24 hr tablet Take 1 tablet (60 mg total) by mouth daily. Qty: 30 tablet, Refills: 2    magnesium oxide (MAG-OX) 400 (241.3 Mg) MG tablet Take 1 tablet (400 mg total) by mouth 2 (two) times daily. Qty: 60 tablet, Refills: 3    mexiletine (MEXITIL) 200 MG capsule Take 1 capsule (200 mg total) by mouth every 12 (twelve) hours. Qty: 60  capsule, Refills: 2    polyethylene glycol (MIRALAX / GLYCOLAX) packet Take 17 g by mouth daily as needed for moderate constipation. Qty: 14 each, Refills: 0    vitamin B-12 1000 MCG tablet Take 1 tablet (1,000 mcg total) by mouth daily. Qty: 30 tablet, Refills: 2      CONTINUE these medications which have CHANGED   Details  levothyroxine (SYNTHROID, LEVOTHROID) 125 MCG tablet Take 1 tablet (125 mcg total) by mouth daily before breakfast. Qty: 30 tablet, Refills: 2    predniSONE (DELTASONE) 20 MG tablet Take 1 tablet (20 mg total) by mouth daily with breakfast. Qty: 3 tablet, Refills: 0      CONTINUE these medications which have NOT CHANGED   Details  albuterol (PROVENTIL) (2.5 MG/3ML) 0.083% nebulizer solution Take 2.5 mg by nebulization every 4 (four) hours as needed for wheezing or shortness of breath.    atorvastatin (LIPITOR) 80 MG tablet Take 40 mg by mouth daily.    fluticasone furoate-vilanterol (BREO ELLIPTA) 100-25 MCG/INH AEPB Inhale 1 puff into the lungs daily.    omeprazole (PRILOSEC) 40 MG capsule Take 40 mg by mouth daily.    potassium chloride SA (K-DUR,KLOR-CON) 20 MEQ tablet Take 10 mEq by mouth daily.     temazepam (RESTORIL) 15 MG capsule Take 1 capsule (15 mg total) by mouth at bedtime as needed for sleep. Qty: 30 capsule, Refills: 2   Associated Diagnoses: Rectal adenocarcinoma ([place])    tiotropium (SPIRIVA HANDIHALER) 18 MCG inhalation capsule Place 1  capsule (18 mcg total) into inhaler and inhale daily. Qty: 90 capsule, Refills: 3   Associated Diagnoses: Simple chronic bronchitis (HCC)      STOP taking these medications     azithromycin (ZITHROMAX) 250 MG tablet      ibuprofen (ADVIL,MOTRIN) 800 MG tablet      lisinopril-hydrochlorothiazide (PRINZIDE,ZESTORETIC) 20-12.5 MG tablet          Discharge Condition: Stable    Discharge Instructions Get Medicines reviewed and adjusted: Please take all your medications with you for your next  visit with your Primary MD  Please request your Primary MD to go over all hospital tests and procedure/radiological results at the follow up, please ask your Primary MD to get all Hospital records sent to his/her office.  If you experience worsening of your admission symptoms, develop shortness of breath, life threatening emergency, suicidal or homicidal thoughts you must seek medical attention immediately by calling 911 or calling your MD immediately if symptoms less severe.  You must read complete instructions/literature along with all the possible adverse reactions/side effects for all the Medicines you take and that have been prescribed to you. Take any new Medicines after you have completely understood and accpet all the possible adverse reactions/side effects.   Do not drive when taking Pain medications.   Do not take more than prescribed Pain, Sleep and Anxiety Medications  Special Instructions: If you have smoked or chewed Tobacco in the last 2 yrs please stop smoking, stop any regular Alcohol and or any Recreational drug use.  Wear Seat belts while driving.  Please note  You were cared for by a hospitalist during your hospital stay. Once you are discharged, your primary care physician will handle any further medical issues. Please note that NO REFILLS for any discharge medications will be authorized once you are discharged, as it is imperative that you return to your primary care physician (or establish a relationship with a primary care physician if you do not have one) for your aftercare needs so that they can reassess your need for medications and monitor your lab values.  Discharge Instructions    Diet - low sodium heart healthy    Complete by:  As directed    Increase activity slowly    Complete by:  As directed        Allergies  Allergen Reactions  . Levofloxacin Other (See Comments)    Pt states that this medication was too strong and she could not take them.    .  Moxifloxacin Rash  . Prednisone Itching and Rash  . Quinolones Rash      Disposition: Home with home health    Consults: cardiology Critical care    Significant Diagnostic Studies:  Dg Chest 2 View  Result Date: 11/08/2016 CLINICAL DATA:  81 year old female with chest pain and shortness of breath since yesterday. Initial encounter. Personal history of colon cancer. EXAM: CHEST  2 VIEW COMPARISON:  CT Abdomen and Pelvis 05/22/2016. Chest radiographs 05/22/2016 and earlier. FINDINGS: Upright AP and lateral views of the chest. New small bilateral pleural effusions. Stable mild cardiomegaly. Other mediastinal contours are within normal limits. Visualized tracheal air column is within normal limits. No pneumothorax. No consolidation. Chronic diffuse increased pulmonary interstitium appears not significantly changed. No acute edema suspected. Osteopenia. Calcified aortic atherosclerosis. No acute osseous abnormality identified. Negative visible bowel gas pattern. IMPRESSION: New small bilateral pleural effusions. No other acute cardiopulmonary abnormality. Electronically Signed   By: Genevie Ann M.D.   On:  11/08/2016 10:57   Dg Chest Port 1 View  Result Date: 11/16/2016 CLINICAL DATA:  Shortness of Breath EXAM: PORTABLE CHEST 1 VIEW COMPARISON:  11/12/2016 FINDINGS: Cardiac shadow is stable. Significant decrease in vascular congestion is noted when compared with the prior exam. No focal confluent infiltrate is seen. No acute bony abnormality is noted. IMPRESSION: Significant improvement in vascular congestion. No significant pulmonary edema is noted. Electronically Signed   By: Inez Catalina M.D.   On: 11/16/2016 11:32   Dg Abd Portable 1v  Result Date: 11/09/2016 CLINICAL DATA:  Impaired nasogastric feeding tube EXAM: PORTABLE ABDOMEN - 1 VIEW COMPARISON:  CT from 05/22/2016, lumbar spine CT 10/14/2016 FINDINGS: The tip and side port of a nasogastric tube is seen in the left upper quadrant in the  expected location of the stomach. Scattered gas containing small large bowel loops in a nonobstructive pattern. Levoscoliosis with lumbar spondylosis of the dorsal spine. Urinary probe projects over the mid pelvis. Mild sclerosis about the sacroiliac joints bilaterally. Patient has sacral insufficiency fractures by CT. IMPRESSION: Gastric tube in the expected location the stomach. No bowel obstruction or free air. Levoconvex curvature of the lumbar spine with spondylosis. Electronically Signed   By: Ashley Royalty M.D.   On: 11/09/2016 23:28   Dg Chest Port 1 View  Result Date: 11/12/2016 CLINICAL DATA:  81 year old female with history of COPD presenting with low oxygen saturations. Congestion. Shortness of breath. Wheezing. EXAM: PORTABLE CHEST 1 VIEW COMPARISON:  Chest x-ray 11/08/2016. FINDINGS: New airspace consolidation in the perihilar aspect of the right lung partially obscuring the right heart border, concerning for developing bronchopneumonia likely involving the right middle lobe. Small bilateral pleural effusions. Cephalization of the pulmonary vasculature, without frank pulmonary edema. Mild cardiomegaly. The patient is rotated to the left on today's exam, resulting in distortion of the mediastinal contours and reduced diagnostic sensitivity and specificity for mediastinal pathology. Atherosclerosis in the thoracic aorta. IMPRESSION: 1. Developing airspace consolidation in the right mid to lower lung, involving at least the right middle lobe, concerning for developing bronchopneumonia. Followup PA and lateral chest X-ray is recommended in 3-4 weeks following trial of antibiotic therapy to ensure resolution and exclude underlying malignancy. 2. Cardiomegaly with pulmonary venous congestion, but no frank pulmonary edema. 3. Small bilateral pleural effusions. 4. Aortic atherosclerosis. Electronically Signed   By: Vinnie Langton M.D.   On: 11/12/2016 12:27    2-D  echo  ------------------------------------------------------------------- Indications:      Abnormal EKG 794.31.  ------------------------------------------------------------------- History:   PMH:  Acute Respiratory Failure. Elevated Troponin. Chronic Kidney Disease.  Chronic obstructive pulmonary disease. Risk factors:  Hypertension. Dyslipidemia.  ------------------------------------------------------------------- Study Conclusions  - Left ventricle: The cavity size was normal. Wall thickness was   increased increased in a pattern of mild to moderate LVH.   Systolic function was moderately to severely reduced. The   estimated ejection fraction was in the range of 30% to 35%. Wall   motion was normal; there were no regional wall motion   abnormalities. - Mitral valve: There was moderate to severe regurgitation. - Left atrium: The atrium was mildly to moderately dilated. - Pulmonary arteries: Systolic pressure was mildly increased. PA   peak pressure: 34 mm Hg (S).  Cardiac catheterization Conclusion     LV end diastolic pressure is mildly elevated.  Ost 2nd Diag lesion, 50 %stenosed.  Mid LAD lesion, 25 %stenosed.  Prox LAD to Mid LAD lesion, 50 %stenosed.  Lat 2nd Diag lesion, 90 %stenosed. This is  a small branch vessel which would not be the cause of global LV dysfunction.  Ost Cx to Prox Cx lesion, 25 %stenosed.  Mid RCA lesion, 25 %stenosed.  There is no pulmonic valve stenosis.  PA sat 79% on oxygen facemask. CO 8.6 L/min. CI 5.6.   Mild to moderate scattered coronary artery disease which does not appear to explain her global LV dysfunction. Severe disease only in a single secondary branch. Continue aggressive medical therapy. Right heart pressures did not appear to be elevated. She did desaturate during the procedure which required supplemental oxygen and likely affected our saturations.  There was difficulty navigating the right radial artery. Selective  right radial angiography showed some tortuosity. Coronary wire was required. If emergent cardiac cath was needed in the future, would avoid right radial approach.  Continue medical therapy for cardiomyopathy.     Filed Weights   11/18/16 0500 11/19/16 0452 11/20/16 0500  Weight: 55.4 kg (122 lb 1.6 oz) 57.2 kg (126 lb 3.2 oz) 56.9 kg (125 lb 6.4 oz)     Microbiology: Recent Results (from the past 240 hour(s))  Culture, blood (routine x 2)     Status: None   Collection Time: 11/13/16 10:27 AM  Result Value Ref Range Status   Specimen Description BLOOD LEFT ANTECUBITAL  Final   Special Requests BOTTLES DRAWN AEROBIC AND ANAEROBIC  1CC  Final   Culture NO GROWTH 5 DAYS  Final   Report Status 11/18/2016 FINAL  Final  Culture, blood (routine x 2)     Status: None   Collection Time: 11/13/16 10:40 AM  Result Value Ref Range Status   Specimen Description BLOOD LEFT ANTECUBITAL  Final   Special Requests BOTTLES DRAWN AEROBIC AND ANAEROBIC  5CC  Final   Culture NO GROWTH 5 DAYS  Final   Report Status 11/18/2016 FINAL  Final       Blood Culture    Component Value Date/Time   SDES BLOOD LEFT ANTECUBITAL 11/13/2016 1040   SPECREQUEST BOTTLES DRAWN AEROBIC AND ANAEROBIC  5CC 11/13/2016 1040   CULT NO GROWTH 5 DAYS 11/13/2016 1040   REPTSTATUS 11/18/2016 FINAL 11/13/2016 1040      Labs: Results for orders placed or performed during the hospital encounter of 11/08/16 (from the past 48 hour(s))  I-STAT 3, arterial blood gas (G3+)     Status: Abnormal   Collection Time: 11/18/16 11:05 AM  Result Value Ref Range   pH, Arterial 7.427 7.350 - 7.450   pCO2 arterial 48.7 (H) 32.0 - 48.0 mmHg   pO2, Arterial 130.0 (H) 83.0 - 108.0 mmHg   Bicarbonate 32.1 (H) 20.0 - 28.0 mmol/L   TCO2 34 0 - 100 mmol/L   O2 Saturation 99.0 %   Acid-Base Excess 7.0 (H) 0.0 - 2.0 mmol/L   Patient temperature HIDE    Sample type ARTERIAL   I-STAT 3, venous blood gas (G3P V)     Status: Abnormal    Collection Time: 11/18/16 11:11 AM  Result Value Ref Range   pH, Ven 7.393 7.250 - 7.430   pCO2, Ven 53.7 44.0 - 60.0 mmHg   pO2, Ven 45.0 32.0 - 45.0 mmHg   Bicarbonate 32.7 (H) 20.0 - 28.0 mmol/L   TCO2 34 0 - 100 mmol/L   O2 Saturation 79.0 %   Acid-Base Excess 6.0 (H) 0.0 - 2.0 mmol/L   Patient temperature HIDE    Sample type VENOUS   I-STAT 3, venous blood gas (G3P V)     Status:  Abnormal   Collection Time: 11/18/16 11:21 AM  Result Value Ref Range   pH, Ven 7.403 7.250 - 7.430   pCO2, Ven 51.2 44.0 - 60.0 mmHg   pO2, Ven 44.0 32.0 - 45.0 mmHg   Bicarbonate 32.0 (H) 20.0 - 28.0 mmol/L   TCO2 34 0 - 100 mmol/L   O2 Saturation 79.0 %   Acid-Base Excess 6.0 (H) 0.0 - 2.0 mmol/L   Patient temperature HIDE    Sample type VENOUS   CBC     Status: Abnormal   Collection Time: 11/18/16  1:20 PM  Result Value Ref Range   WBC 6.6 4.0 - 10.5 K/uL   RBC 2.61 (L) 3.87 - 5.11 MIL/uL   Hemoglobin 8.7 (L) 12.0 - 15.0 g/dL   HCT 26.7 (L) 36.0 - 46.0 %   MCV 102.3 (H) 78.0 - 100.0 fL   MCH 33.3 26.0 - 34.0 pg   MCHC 32.6 30.0 - 36.0 g/dL   RDW 14.7 11.5 - 15.5 %   Platelets 227 150 - 400 K/uL  Creatinine, serum     Status: Abnormal   Collection Time: 11/18/16  1:20 PM  Result Value Ref Range   Creatinine, Ser 1.91 (H) 0.44 - 1.00 mg/dL   GFR calc non Af Amer 24 (L) >60 mL/min   GFR calc Af Amer 28 (L) >60 mL/min    Comment: (NOTE) The eGFR has been calculated using the CKD EPI equation. This calculation has not been validated in all clinical situations. eGFR's persistently <60 mL/min signify possible Chronic Kidney Disease.   CBC     Status: Abnormal   Collection Time: 11/19/16  5:57 AM  Result Value Ref Range   WBC 5.6 4.0 - 10.5 K/uL   RBC 2.45 (L) 3.87 - 5.11 MIL/uL   Hemoglobin 7.9 (L) 12.0 - 15.0 g/dL   HCT 25.2 (L) 36.0 - 46.0 %   MCV 102.9 (H) 78.0 - 100.0 fL   MCH 32.2 26.0 - 34.0 pg   MCHC 31.3 30.0 - 36.0 g/dL   RDW 14.7 11.5 - 15.5 %   Platelets 216 150 - 400  K/uL  Comprehensive metabolic panel     Status: Abnormal   Collection Time: 11/19/16  5:57 AM  Result Value Ref Range   Sodium 143 135 - 145 mmol/L   Potassium 3.5 3.5 - 5.1 mmol/L   Chloride 105 101 - 111 mmol/L   CO2 29 22 - 32 mmol/L   Glucose, Bld 114 (H) 65 - 99 mg/dL   BUN 41 (H) 6 - 20 mg/dL   Creatinine, Ser 1.91 (H) 0.44 - 1.00 mg/dL   Calcium 8.1 (L) 8.9 - 10.3 mg/dL   Total Protein 5.4 (L) 6.5 - 8.1 g/dL   Albumin 2.7 (L) 3.5 - 5.0 g/dL   AST 19 15 - 41 U/L   ALT 20 14 - 54 U/L   Alkaline Phosphatase 41 38 - 126 U/L   Total Bilirubin 0.3 0.3 - 1.2 mg/dL   GFR calc non Af Amer 24 (L) >60 mL/min   GFR calc Af Amer 28 (L) >60 mL/min    Comment: (NOTE) The eGFR has been calculated using the CKD EPI equation. This calculation has not been validated in all clinical situations. eGFR's persistently <60 mL/min signify possible Chronic Kidney Disease.    Anion gap 9 5 - 15  Glucose, capillary     Status: None   Collection Time: 11/19/16  7:34 AM  Result Value Ref Range  Glucose-Capillary 87 65 - 99 mg/dL  Glucose, capillary     Status: None   Collection Time: 11/20/16  6:01 AM  Result Value Ref Range   Glucose-Capillary 99 65 - 99 mg/dL  CBC     Status: Abnormal   Collection Time: 11/20/16  6:19 AM  Result Value Ref Range   WBC 6.7 4.0 - 10.5 K/uL   RBC 2.47 (L) 3.87 - 5.11 MIL/uL   Hemoglobin 7.9 (L) 12.0 - 15.0 g/dL   HCT 25.0 (L) 36.0 - 46.0 %   MCV 101.2 (H) 78.0 - 100.0 fL   MCH 32.0 26.0 - 34.0 pg   MCHC 31.6 30.0 - 36.0 g/dL   RDW 14.6 11.5 - 15.5 %   Platelets 238 150 - 400 K/uL  Comprehensive metabolic panel     Status: Abnormal   Collection Time: 11/20/16  6:19 AM  Result Value Ref Range   Sodium 143 135 - 145 mmol/L   Potassium 3.5 3.5 - 5.1 mmol/L   Chloride 101 101 - 111 mmol/L   CO2 33 (H) 22 - 32 mmol/L   Glucose, Bld 94 65 - 99 mg/dL   BUN 42 (H) 6 - 20 mg/dL   Creatinine, Ser 2.00 (H) 0.44 - 1.00 mg/dL   Calcium 8.4 (L) 8.9 - 10.3 mg/dL    Total Protein 5.7 (L) 6.5 - 8.1 g/dL   Albumin 2.8 (L) 3.5 - 5.0 g/dL   AST 16 15 - 41 U/L   ALT 18 14 - 54 U/L   Alkaline Phosphatase 40 38 - 126 U/L   Total Bilirubin 0.3 0.3 - 1.2 mg/dL   GFR calc non Af Amer 23 (L) >60 mL/min   GFR calc Af Amer 26 (L) >60 mL/min    Comment: (NOTE) The eGFR has been calculated using the CKD EPI equation. This calculation has not been validated in all clinical situations. eGFR's persistently <60 mL/min signify possible Chronic Kidney Disease.    Anion gap 9 5 - 15  Type and screen [place]     Status: None   Collection Time: 11/20/16  6:30 AM  Result Value Ref Range   ABO/RH(D) B POS    Antibody Screen NEG    Sample Expiration 11/23/2016   ABO/Rh     Status: None   Collection Time: 11/20/16  6:30 AM  Result Value Ref Range   ABO/RH(D) B POS      Lipid Panel     Component Value Date/Time   CHOL 264 (H) 07/05/2016 1051   CHOL 225 (H) 06/29/2015 1150   TRIG 157 (H) 07/05/2016 1051   TRIG 230 (H) 12/29/2014 1155   HDL 62 07/05/2016 1051   HDL 50 06/29/2015 1150   HDL 49 12/29/2014 1155   CHOLHDL 4.3 07/05/2016 1051   VLDL 31 07/05/2016 1051   LDLCALC 171 (H) 07/05/2016 1051   LDLCALC 115 (H) 06/29/2015 1150   LDLCALC 63 08/27/2013 1209      Ms. Melland is a 69F with PMH significant for COPD, CKD III, GERD, HLD, HTN, prior rectal adenoca and hypothyroidism. She was admitted to  [facility] on 12/29 with complaints of worsening shortness of breath x 1 day. She was dyspneic both with exertion and at rest. She denied associated cough, sputum, fever, chills, chest pain, palpitations. On presentation she was noted to be hypoxemic. She had a mild troponin leak at 0.05.    she had increasing shortness of breath and wheezing, persistent hypertension and telemetry reported  arrhythmias (bigeminy, trigeminy, brief runs of V tach). She was transferred to ICU. She was seen by cardiology with bedside echo showing EF ~30%. She was started on  lidocaine, given iv lasix and transfer to [name] was recommended. She was also started on BiPAP for work of breathing.   She has been short of breath for several days (since 12/26), but had an acute worsening the morning of 12/29. She had gone to her pulmonologist several days ago to get clearance for back surgery and that physician felt that she was acutely exacerbated and gave her a kenalog shot, prednisone and an antibiotic shot. Despite those interventions, she failed to improve. She is usually maintained on Breo, Spiriva and prn nebs.  Patient is now admitted for acute on chronic hypoxic respiratory failure again related to CHF and COPD exacerbation  Hospital course Acute on chronic hypoxemic respiratory failure, acute COPD exacerbation, now HCAP-improving Multifactorial secondary to newly identified cardiomyopathy Continue steroid taper, resume Breo, Spiriva Continue supplemental oxygen, 2 L for home Continue nebulizer treatments CXR 1/2 shows Developing airspace consolidation in the right mid to lower lung, involving at least the right middle lobe, concerning for developing Bronchopneumonia. Received vancomycin/cefepime for HCAP 11/12/16-11/14/16. Blood cultures no growth so far Discontinue vancomycin 11/15/16, completed cefepime 1/9. Off all antibiotics now Speech therapy evaluation recommends regular diet and thin liquids Currently requiring 2 L of oxygen    Ventricular ectopy/Acute on chronic systolic CHF  [TTE in 04/2034 w/ EF 59-74%, diastolic dysfunction] Now LV EF: 30% - 35%  significantly less ventricular ectopy after starting lidocaine.  now on Mexitil  Diuretics/fluids managed by cardiology With EF 30 - 35% cardiology has suggested home medications as follows Continue hydralazine 50 q 8 hours, isosorbide 81m daily. No ACE-I/ARB with renal insufficiency.  LVEDP was 19 at cath. Diuresed well on IV lasix, will send home on po Lasix 258mBID.    Chest pain:Status  postcardiac cath, results as above Mild to moderate scattered coronary artery disease which does not appear to explain her global LV dysfunction. Severe disease only in a single secondary branch. Continue aggressive medical therapy  Acute on chronic kidney disease-baseline around 1.6-1.8, renal function more or less stable for 3 days Creatinine around 2.0 prior to discharge   Recent rectal adenoca s/p 5FU then Xeloda, currently off treatment  Hypothyroidism Markedly elevated TSH 103.9 , free T4 low. Dose of Synthroid increased to 125 g a day Repeat thyroid function tests in 6 weeks  Essential hypertension Continue Norvasc 10 mg daily  Now on hydralazine, Imdur,  Anemia of chronic disease/microcytic anemia - Baseline Hemoglobin 10.5 Also found to have B-12 deficiency, started on supplementation   Dyslipidemia - Continue Lipitor    Hypokalemia-repleted   Discharge Exam: *  Blood pressure (!) 146/88, pulse 73, temperature 98 F (36.7 C), temperature source Oral, resp. rate 15, height _0  (1.549 m), weight 56.9 kg (125 lb 6.4 oz), SpO2 96 %.  GEBUL:AGTXourished, well developed, in no acute distress.  HEENT:Grossly normal.  Neck:Supple, no JVD, carotid bruits, or masses. Cardiac:RRR, no murmurs, rubs, or gallops. No clubbing, cyanosis, edema. Radials/DP/PT 2+ and equal bilaterally.  Respiratory:Respirations regular and unlabored, diminished in bases.  GIMI:WOEHnontender, nondistended, BS + x 4. MS:no deformity or atrophy. Skin:warm and dry, no rash. Neuro:Strength and sensation are intact. Psych:AAOx3. Normal affect.     Follow-up Information    [facility]Home [facility] Follow up.   Why:  Registered Nurse, Physical Therapy, Aide Contact information:  [address] [place] [address] [phone]        Inc. - Dme [facility] Follow up.   Why:  Oxygen Contact information: [place]  [id number] [phone]           Signed: Reyne Dumas 11/20/2016, 9:58 AM        Time spent >45 mins

## 2016-11-20 NOTE — Discharge Instructions (Signed)
Chronic Obstructive Pulmonary Disease Chronic obstructive pulmonary disease (COPD) is a common lung problem. In COPD, the flow of air from the lungs is limited. The way your lungs work will probably never return to normal, but there are things you can do to improve your lungs and make yourself feel better. Your doctor may treat your condition with:  Medicines.  Oxygen.  Lung surgery.  Changes to your diet.  Rehabilitation. This may involve a team of specialists. Follow these instructions at home:  Take all medicines as told by your doctor.  Avoid medicines or cough syrups that dry up your airway (such as antihistamines) and do not allow you to get rid of thick spit. You do not need to avoid them if told differently by your doctor.  If you smoke, stop. Smoking makes the problem worse.  Avoid being around things that make your breathing worse (like smoke, chemicals, and fumes).  Use oxygen therapy and therapy to help improve your lungs (pulmonary rehabilitation) if told by your doctor. If you need home oxygen therapy, ask your doctor if you should buy a tool to measure your oxygen level (oximeter).  Avoid people who have a sickness you can catch (contagious).  Avoid going outside when it is very hot, cold, or humid.  Eat healthy foods. Eat smaller meals more often. Rest before meals.  Stay active, but remember to also rest.  Make sure to get all the shots (vaccines) your doctor recommends. Ask your doctor if you need a pneumonia shot.  Learn and use tips on how to relax.  Learn and use tips on how to control your breathing as told by your doctor. Try: 1. Breathing in (inhaling) through your nose for 1 second. Then, pucker your lips and breath out (exhale) through your lips for 2 seconds. 2. Putting one hand on your belly (abdomen). Breathe in slowly through your nose for 1 second. Your hand on your belly should move out. Pucker your lips and breathe out slowly through your lips.  Your hand on your belly should move in as you breathe out.  Learn and use controlled coughing to clear thick spit from your lungs. The steps are: 1. Lean your head a little forward. 2. Breathe in deeply. 3. Try to hold your breath for 3 seconds. 4. Keep your mouth slightly open while coughing 2 times. 5. Spit any thick spit out into a tissue. 6. Rest and do the steps again 1 or 2 times as needed. Contact a doctor if:  You cough up more thick spit than usual.  There is a change in the color or thickness of the spit.  It is harder to breathe than usual.  Your breathing is faster than usual. Get help right away if:  You have shortness of breath while resting.  You have shortness of breath that stops you from:  Being able to talk.  Doing normal activities.  You chest hurts for longer than 5 minutes.  Your skin color is more blue than usual.  Your pulse oximeter shows that you have low oxygen for longer than 5 minutes. This information is not intended to replace advice given to you by your health care provider. Make sure you discuss any questions you have with your health care provider. Document Released: 04/15/2008 Document Revised: 04/04/2016 Document Reviewed: 06/24/2013 Elsevier Interactive Patient Education  2017 Elsevier Inc.  

## 2016-11-20 NOTE — Progress Notes (Signed)
Pt discharged home. Discharge instructions have been gone over with the patient. IV's removed. Pt given unit number and told to call if they have any concerns regarding their discharge instructions. Haila Dena V, RN   

## 2016-11-28 NOTE — Progress Notes (Signed)
Cardiology Office Note    Date:  12/04/2016   ID:  Meghan, Anderson 10-05-1936, MRN [id number]  PCP:  [name], MD  Cardiologist:  Dr. Harrington Challenger  Chief Complaint: Hospital follow up s/p  Cath  History of Present Illness:   Meghan Anderson is a 81 y.o. female with a past medical history of COPD, CKD, IIA (T3N0M0) rectal adenocarcinoma, S/P resection of rectal mass by Dr. Marcello Moores on 12/14/2015, now S/P concomitant chemoradiation, HLD who presents for hospital follow up.   She was admitted to [facility] on 11/08/16  with complaints of worsening shortness of breath x 1 day. She was transferred to [facility] due to worsening dyspnea due to acute respiratory failure and COPD exacerbation. Cardiology consulted due to NSVT which improved on  Mexiletine. Troponin of 0.05. However echo showed reduced EF of 30-35% (EF was 50-55% in 2016). Cath showed mild to moderate scattered coronary artery disease which does not appear to explain her global LV dysfunction. 2nd diagonal was 95% stenosed, but this vessel is too small for her global reduced LV function. Will treat medically. LVEDP was 19 at cath. Diuresed well on IV lasix. No ACE-I/ARB with renal insufficiency.  Here today for follow up. She hasn't follow-up with primary care provider yet. Denies any chest pain, shortness of breath, orthopnea, PND, melena or blood in her stool or urine. Complains of mild lower extremity edema. Her weight has been stable at home to 120 LB. compliant with her salt intake.  Past Medical History:  Diagnosis Date  . Arthritis   . Cataract   . Chronic bronchitis ([place])   . Chronic renal disease, stage 3, moderately decreased glomerular filtration rate (GFR) between 30-59 mL/min/1.73 square meter 02/21/2016  . Complication of anesthesia   . COPD (chronic obstructive pulmonary disease) ([place])   . GERD (gastroesophageal reflux disease)   . Hyperlipidemia   . Hypertension   . Hypothyroidism   . Murmur, heart   . PONV  (postoperative nausea and vomiting)   . Rectal adenocarcinoma ([place])   . Thyroid disease     Past Surgical History:  Procedure Laterality Date  . ABDOMINAL HYSTERECTOMY     complete  . CARDIAC CATHETERIZATION N/A 11/18/2016   Procedure: Left Heart Cath and Coronary Angiography;  Surgeon: [name], MD;  Location: [place] [facility];  Service: Cardiovascular;  Laterality: N/A;  . CARDIAC CATHETERIZATION N/A 11/18/2016   Procedure: Right Heart Cath;  Surgeon: [name], MD;  Location: [place] [facility];  Service: Cardiovascular;  Laterality: N/A;  . COLONOSCOPY N/A 11/01/2015   RMR: Large fungating rectal mass precluded colonoscopy. Status post biopsy.   . COLONOSCOPY N/A 01/15/2016   Procedure: COLONOSCOPY;  Surgeon: [name], MD;  Location: [facility];  Service: Endoscopy;  Laterality: N/A;  130   . FLEXIBLE SIGMOIDOSCOPY N/A 05/17/2016   Procedure: FLEXIBLE SIGMOIDOSCOPY;  Surgeon: [name], MD;  Location: [facility];  Service: Endoscopy;  Laterality: N/A;  . Left ear    . TRANSANAL ENDOSCOPIC MICROSURGERY N/A 12/14/2015   Procedure: TRANSANAL ENDOSCOPIC MICROSURGERY OF RECTAL POLYP;  Surgeon: [name], MD;  Location: [facility] OR[facility];  Service: General;  Laterality: N/A;    Current Medications: Prior to Admission medications   Medication Sig Start Date End Date Taking? Authorizing Provider  albuterol (PROVENTIL) (2.5 MG/3ML) 0.083% nebulizer solution NEBULIZE 1 VIAL EVERY 4 HOURS AS NEEDED 11/21/16   Fransisca Kaufmann Dettinger, MD  aspirin EC 81 MG EC tablet Take  1 tablet (81 mg total) by mouth daily. 11/21/16 12/21/16  Reyne Dumas, MD  atorvastatin (LIPITOR) 80 MG tablet Take 40 mg by mouth daily.    Historical Provider, MD  fluticasone furoate-vilanterol (BREO ELLIPTA) 100-25 MCG/INH AEPB Inhale 1 puff into the lungs daily.    Historical Provider, MD  furosemide (LASIX) 20 MG tablet Take 1 tablet (20 mg total) by mouth 2 (two) times daily. 11/20/16 12/20/16  Reyne Dumas,  MD  hydrALAZINE (APRESOLINE) 50 MG tablet Take 1 tablet (50 mg total) by mouth every 8 (eight) hours. 11/20/16 12/20/16  Reyne Dumas, MD  isosorbide mononitrate (IMDUR) 60 MG 24 hr tablet Take 1 tablet (60 mg total) by mouth daily. 11/21/16 12/21/16  Reyne Dumas, MD  levothyroxine (SYNTHROID, LEVOTHROID) 125 MCG tablet Take 1 tablet (125 mcg total) by mouth daily before breakfast. 11/21/16 12/21/16  Reyne Dumas, MD  magnesium oxide (MAG-OX) 400 (241.3 Mg) MG tablet Take 1 tablet (400 mg total) by mouth 2 (two) times daily. 11/20/16 12/20/16  Reyne Dumas, MD  mexiletine (MEXITIL) 200 MG capsule Take 1 capsule (200 mg total) by mouth every 12 (twelve) hours. 11/20/16 12/20/16  Reyne Dumas, MD  omeprazole (PRILOSEC) 40 MG capsule Take 40 mg by mouth daily.    Historical Provider, MD  polyethylene glycol (MIRALAX / GLYCOLAX) packet Take 17 g by mouth daily as needed for moderate constipation. 11/20/16   Reyne Dumas, MD  potassium chloride SA (K-DUR,KLOR-CON) 20 MEQ tablet Take 10 mEq by mouth daily.     Historical Provider, MD  temazepam (RESTORIL) 15 MG capsule Take 1 capsule (15 mg total) by mouth at bedtime as needed for sleep. 09/02/16   Baird Cancer, PA-C  tiotropium (SPIRIVA HANDIHALER) 18 MCG inhalation capsule Place 1 capsule (18 mcg total) into inhaler and inhale daily. 06/29/15   Fransisca Kaufmann Dettinger, MD  vitamin B-12 1000 MCG tablet Take 1 tablet (1,000 mcg total) by mouth daily. 11/21/16 12/21/16  Reyne Dumas, MD    Allergies:   Levofloxacin; Moxifloxacin; Prednisone; and Quinolones   Social History   Social History  . Marital status: Widowed    Spouse name: N/A  . Number of children: N/A  . Years of education: N/A   Social History Main Topics  . Smoking status: Former Smoker    Quit date: 11/11/2010  . Smokeless tobacco: Never Used  . Alcohol use No  . Drug use: No  . Sexual activity: Not Asked   Other Topics Concern  . None   Social History Narrative  . None     Family History:   The patient's family history includes Asthma in her brother; COPD in her brother; Cancer (age of onset: 19) in her brother; Emphysema in her brother; Kidney disease in her mother.  ROS:   Please see the history of present illness.    ROS All other systems reviewed and are negative.   PHYSICAL EXAM:   VS:  BP 140/82 (BP Location: Right Arm, Patient Position: Sitting, Cuff Size: Normal)   Pulse 96   Ht 5\' 1"  (1.549 m)   Wt 123 lb (55.8 kg)   BMI 23.24 kg/m    GEN: Well nourished, well developed, in no acute distress  HEENT: normal  Neck: no JVD, carotid bruits, or masses Cardiac: RRR; no murmurs, rubs, or gallops, Trace BL LE edema  L > R Respiratory:  clear to auscultation bilaterally, normal work of breathing GI: soft, nontender, nondistended, + BS MS: no deformity or atrophy  Skin:  warm and dry, no rash Neuro:  Alert and Oriented x 3, Strength and sensation are intact Psych: euthymic mood, full affect  Wt Readings from Last 3 Encounters:  12/04/16 123 lb (55.8 kg)  11/20/16 125 lb 6.4 oz (56.9 kg)  09/16/16 132 lb 12.8 oz (60.2 kg)      Studies/Labs Reviewed:   EKG:  EKG is not ordered today.    Recent Labs: 11/09/2016: TSH 103.897 11/12/2016: Magnesium 2.0 11/14/2016: B Natriuretic Peptide 617.2 11/20/2016: ALT 18; BUN 42; Creatinine, Ser 2.00; Hemoglobin 7.9; Platelets 238; Potassium 3.5; Sodium 143   Lipid Panel    Component Value Date/Time   CHOL 264 (H) 07/05/2016 1051   CHOL 225 (H) 06/29/2015 1150   TRIG 157 (H) 07/05/2016 1051   TRIG 230 (H) 12/29/2014 1155   HDL 62 07/05/2016 1051   HDL 50 06/29/2015 1150   HDL 49 12/29/2014 1155   CHOLHDL 4.3 07/05/2016 1051   VLDL 31 07/05/2016 1051   LDLCALC 171 (H) 07/05/2016 1051   LDLCALC 115 (H) 06/29/2015 1150   LDLCALC 63 08/27/2013 1209    Additional studies/ records that were reviewed today include:    Echo 11/11/16 Study Conclusions  - Left ventricle: The cavity size was normal. Wall thickness  was increased increased in a pattern of mild to moderate LVH. Systolic function was moderately to severely reduced. The estimated ejection fraction was in the range of 30% to 35%. Wall motion was normal; there were no regional wall motion abnormalities. - Mitral valve: There was moderate to severe regurgitation. - Left atrium: The atrium was mildly to moderately dilated. - Pulmonary arteries: Systolic pressure was mildly increased. PA peak pressure: 34 mm Hg (S).  Left Heart Cath and Coronary Angiography  Right Heart Cath    LV end diastolic pressure is mildly elevated.  Ost 2nd Diag lesion, 50 %stenosed.  Mid LAD lesion, 25 %stenosed.  Prox LAD to Mid LAD lesion, 50 %stenosed.  Lat 2nd Diag lesion, 90 %stenosed. This is a small branch vessel which would not be the cause of global LV dysfunction.  Ost Cx to Prox Cx lesion, 25 %stenosed.  Mid RCA lesion, 25 %stenosed.  There is no pulmonic valve stenosis.  PA sat 79% on oxygen facemask. CO 8.6 L/min. CI 5.6.  Mild to moderate scattered coronary artery disease which does not appear to explain her global LV dysfunction. Severe disease only in a single secondary branch. Continue aggressive medical therapy. Right heart pressures did not appear to be elevated. She did desaturate during the procedure which required supplemental oxygen and likely affected our saturations.  There was difficulty navigating the right radial artery. Selective right radial angiography showed some tortuosity. Coronary wire was required. If emergent cardiac cath was needed in the future, would avoid right radial approach.  Continue medical therapy for cardiomyopathy.    ASSESSMENT & PLAN:    1. Chronic systolic CHF - Echo showed newly reduced EF to 30-35%. Cath as above. She is euvolemic today except trace lower extremity edema. Weight has been stable. Denies shortness of breath, orthopnea or PND. - Add Toprol XL 25mg  qd. No ACE/ARB  dur to CKD. Continue Imdur and hydralazine.  2. NICM - Cath showed mild to moderate scatted CAD. 95% stenosed 2nd diagonal toll small for PCI. Plan to treat medically. As above.  3. NSVT - Continue mexiletine. Asymptomatic.   4.  CKD, stage III - baseline around 1.6-1.8. Scr of 2.0 at discharge. We get BMET today.  5.  Anemia - Hgb of 7.9 on discharge. She hasn't followed up with PCP yet. Baseline Hgb around 10. Will get CBC.    Medication Adjustments/Labs and Tests Ordered: Current medicines are reviewed at length with the patient today.  Concerns regarding medicines are outlined above.  Medication changes, Labs and Tests ordered today are listed in the Patient Instructions below. There are no Patient Instructions on file for this visit.   Jarrett Soho, [place]  12/04/2016 3:01 PM    [facility] [address], [place], [place]  [address] Phone: [phone]; Fax: [phone]

## 2016-12-04 ENCOUNTER — Encounter: Payer: Self-pay | Admitting: Physician Assistant

## 2016-12-04 ENCOUNTER — Ambulatory Visit (INDEPENDENT_AMBULATORY_CARE_PROVIDER_SITE_OTHER): Payer: Medicare Other | Admitting: Physician Assistant

## 2016-12-04 VITALS — BP 140/82 | HR 96 | Ht 61.0 in | Wt 123.0 lb

## 2016-12-04 DIAGNOSIS — I5022 Chronic systolic (congestive) heart failure: Secondary | ICD-10-CM

## 2016-12-04 DIAGNOSIS — D649 Anemia, unspecified: Secondary | ICD-10-CM

## 2016-12-04 DIAGNOSIS — I472 Ventricular tachycardia: Secondary | ICD-10-CM

## 2016-12-04 DIAGNOSIS — N183 Chronic kidney disease, stage 3 unspecified: Secondary | ICD-10-CM

## 2016-12-04 DIAGNOSIS — S37009A Unspecified injury of unspecified kidney, initial encounter: Secondary | ICD-10-CM | POA: Diagnosis not present

## 2016-12-04 DIAGNOSIS — I4729 Other ventricular tachycardia: Secondary | ICD-10-CM

## 2016-12-04 MED ORDER — METOPROLOL SUCCINATE ER 25 MG PO TB24: 25.0000 mg | ORAL_TABLET | Freq: Every day | ORAL | 3 refills | Status: DC

## 2016-12-04 NOTE — Patient Instructions (Addendum)
Medication Instructions:   START TAKING METOPROLOL TOPROL XL  25 MG ONCE A DAY   If you need a refill on your cardiac medications before your next appointment, please call your pharmacy.  Labwork: BMET AND CBC TODAY    Testing/Procedures: NONE ORDERED  TODAY    Follow-Up:  IN 3 MONTHS WITH DR ROSS    Any Other Special Instructions Will Be Listed Below (If Applicable).

## 2016-12-05 LAB — BASIC METABOLIC PANEL
BUN/Creatinine Ratio: 16 (ref 12–28)
BUN: 33 mg/dL — AB (ref 8–27)
CALCIUM: 8.2 mg/dL — AB (ref 8.7–10.3)
CHLORIDE: 103 mmol/L (ref 96–106)
CO2: 24 mmol/L (ref 18–29)
CREATININE: 2.09 mg/dL — AB (ref 0.57–1.00)
GFR calc Af Amer: 26 mL/min/{1.73_m2} — ABNORMAL LOW (ref >59)
GFR calc non Af Amer: 22 mL/min/{1.73_m2} — ABNORMAL LOW (ref >59)
GLUCOSE: 105 mg/dL — AB (ref 65–99)
Potassium: 4.8 mmol/L (ref 3.5–5.2)
Sodium: 145 mmol/L — ABNORMAL HIGH (ref 134–144)

## 2016-12-05 LAB — CBC
HEMATOCRIT: 24.7 % — AB (ref 34.0–46.6)
HEMOGLOBIN: 8 g/dL — AB (ref 11.1–15.9)
MCH: 32.1 pg (ref 26.6–33.0)
MCHC: 32.4 g/dL (ref 31.5–35.7)
MCV: 99 fL — ABNORMAL HIGH (ref 79–97)
Platelets: 253 10*3/uL (ref 150–379)
RBC: 2.49 x10E6/uL — AB (ref 3.77–5.28)
RDW: 14.8 % (ref 12.3–15.4)
WBC: 3.4 10*3/uL (ref 3.4–10.8)

## 2016-12-11 ENCOUNTER — Ambulatory Visit: Payer: Medicare Other | Admitting: Family Medicine

## 2016-12-11 ENCOUNTER — Ambulatory Visit (INDEPENDENT_AMBULATORY_CARE_PROVIDER_SITE_OTHER): Payer: Medicare Other | Admitting: Family Medicine

## 2016-12-11 DIAGNOSIS — R0600 Dyspnea, unspecified: Secondary | ICD-10-CM | POA: Diagnosis not present

## 2016-12-11 DIAGNOSIS — N183 Chronic kidney disease, stage 3 (moderate): Secondary | ICD-10-CM

## 2016-12-11 DIAGNOSIS — Z7951 Long term (current) use of inhaled steroids: Secondary | ICD-10-CM | POA: Diagnosis not present

## 2016-12-11 DIAGNOSIS — I2510 Atherosclerotic heart disease of native coronary artery without angina pectoris: Secondary | ICD-10-CM

## 2016-12-11 DIAGNOSIS — Z7982 Long term (current) use of aspirin: Secondary | ICD-10-CM

## 2016-12-11 DIAGNOSIS — E785 Hyperlipidemia, unspecified: Secondary | ICD-10-CM

## 2016-12-11 DIAGNOSIS — E7800 Pure hypercholesterolemia, unspecified: Secondary | ICD-10-CM

## 2016-12-11 DIAGNOSIS — I10 Essential (primary) hypertension: Secondary | ICD-10-CM | POA: Diagnosis not present

## 2016-12-11 DIAGNOSIS — J449 Chronic obstructive pulmonary disease, unspecified: Secondary | ICD-10-CM | POA: Diagnosis not present

## 2016-12-11 DIAGNOSIS — Z7952 Long term (current) use of systemic steroids: Secondary | ICD-10-CM | POA: Diagnosis not present

## 2016-12-11 DIAGNOSIS — I255 Ischemic cardiomyopathy: Secondary | ICD-10-CM | POA: Diagnosis not present

## 2016-12-17 ENCOUNTER — Encounter: Payer: Self-pay | Admitting: *Deleted

## 2016-12-19 ENCOUNTER — Encounter: Payer: Self-pay | Admitting: Internal Medicine

## 2017-01-17 ENCOUNTER — Other Ambulatory Visit: Payer: Self-pay | Admitting: Family Medicine

## 2017-01-31 ENCOUNTER — Other Ambulatory Visit: Payer: Self-pay | Admitting: Family Medicine

## 2017-02-13 ENCOUNTER — Encounter: Payer: Self-pay | Admitting: Family Medicine

## 2017-02-13 ENCOUNTER — Ambulatory Visit (INDEPENDENT_AMBULATORY_CARE_PROVIDER_SITE_OTHER): Payer: Medicare Other | Admitting: Family Medicine

## 2017-02-13 VITALS — BP 111/68 | HR 79 | Temp 98.6°F | Ht 61.0 in | Wt 130.0 lb

## 2017-02-13 DIAGNOSIS — E039 Hypothyroidism, unspecified: Secondary | ICD-10-CM | POA: Diagnosis not present

## 2017-02-13 DIAGNOSIS — E7800 Pure hypercholesterolemia, unspecified: Secondary | ICD-10-CM

## 2017-02-13 DIAGNOSIS — I10 Essential (primary) hypertension: Secondary | ICD-10-CM | POA: Diagnosis not present

## 2017-02-13 MED ORDER — ATORVASTATIN CALCIUM 80 MG PO TABS: ORAL_TABLET | ORAL | 1 refills | Status: DC

## 2017-02-13 MED ORDER — POTASSIUM CHLORIDE CRYS ER 20 MEQ PO TBCR: 20.0000 meq | EXTENDED_RELEASE_TABLET | Freq: Every day | ORAL | 1 refills | Status: DC

## 2017-02-13 MED ORDER — METOPROLOL SUCCINATE ER 25 MG PO TB24: 25.0000 mg | ORAL_TABLET | Freq: Every day | ORAL | 3 refills | Status: DC

## 2017-02-13 MED ORDER — OMEPRAZOLE 40 MG PO CPDR: 40.0000 mg | DELAYED_RELEASE_CAPSULE | Freq: Every day | ORAL | 1 refills | Status: DC

## 2017-02-13 MED ORDER — FUROSEMIDE 20 MG PO TABS: 20.0000 mg | ORAL_TABLET | Freq: Two times a day (BID) | ORAL | 1 refills | Status: DC

## 2017-02-13 MED ORDER — ISOSORBIDE MONONITRATE ER 60 MG PO TB24: 60.0000 mg | ORAL_TABLET | Freq: Every day | ORAL | 1 refills | Status: DC

## 2017-02-13 MED ORDER — HYDRALAZINE HCL 50 MG PO TABS: 50.0000 mg | ORAL_TABLET | Freq: Three times a day (TID) | ORAL | 1 refills | Status: DC

## 2017-02-13 NOTE — Progress Notes (Signed)
BP 111/68   Pulse 79   Temp 98.6 F (37 C) (Oral)   Ht _0  (1.549 m)   Wt 130 lb (59 kg)   BMI 24.56 kg/m    Subjective:    Patient ID: Meghan Anderson, female    DOB: January 03, 1936, 81 y.o.   MRN: [id number]  HPI: Meghan Anderson is a 81 y.o. female presenting on 02/13/2017 for Medication refills   HPI Hypothyroidism recheck Patient is coming in for thyroid recheck today as well. He denies any issues with hair changes or heat or cold problems or diarrhea or constipation. He denies any chest pain or palpitations. He is currently on levothyroxine 125 micrograms.  Hyperlipidemia Patient is coming in for recheck of his hyperlipidemia. SHe is currently taking Lipitor. SHe denies any issues with myalgias or history of liver damage from it. SHe denies any focal numbness or weakness or chest pain.    Hypertension Patient is coming in for blood pressure recheck today. Her blood pressure is 111/68. She is currently on metoprolol and Imdur and hydralazine. Patient denies headaches, blurred vision, chest pains, shortness of breath, or weakness. Denies any side effects from medication and is content with current medication.   Relevant past medical, surgical, family and social history reviewed and updated as indicated. Interim medical history since our last visit reviewed. Allergies and medications reviewed and updated.  Review of Systems  Constitutional: Negative for chills and fever.  HENT: Negative for congestion, ear discharge and ear pain.   Eyes: Negative for redness and visual disturbance.  Respiratory: Negative for chest tightness and shortness of breath.   Cardiovascular: Negative for chest pain and leg swelling.  Genitourinary: Negative for difficulty urinating and dysuria.  Musculoskeletal: Negative for back pain and gait problem.  Skin: Negative for rash.  Neurological: Negative for light-headedness and headaches.  Psychiatric/Behavioral: Negative for agitation and behavioral  problems.  All other systems reviewed and are negative.   Per HPI unless specifically indicated above   Allergies as of 02/13/2017      Reactions   Levofloxacin Other (See Comments)   Pt states that this medication was too strong and she could not take them.     Moxifloxacin Rash   Prednisone Itching, Rash   Quinolones Rash      Medication List       Accurate as of 02/13/17  2:02 PM. Always use your most recent med list.          albuterol (2.5 MG/3ML) 0.083% nebulizer solution Commonly known as:  PROVENTIL NEBULIZE 1 VIAL EVERY 4 HOURS AS NEEDED   atorvastatin 80 MG tablet Commonly known as:  LIPITOR TAKE ONE (1) TABLET EACH DAY   BREO ELLIPTA 100-25 MCG/INH Aepb Generic drug:  fluticasone furoate-vilanterol Inhale 1 puff into the lungs daily.   furosemide 20 MG tablet Commonly known as:  LASIX Take 1 tablet (20 mg total) by mouth 2 (two) times daily.   furosemide 20 MG tablet Commonly known as:  LASIX Take 1 tablet (20 mg total) by mouth 2 (two) times daily.   hydrALAZINE 50 MG tablet Commonly known as:  APRESOLINE Take 1 tablet (50 mg total) by mouth every 8 (eight) hours.   hydrALAZINE 50 MG tablet Commonly known as:  APRESOLINE Take 1 tablet (50 mg total) by mouth 3 (three) times daily.   isosorbide mononitrate 60 MG 24 hr tablet Commonly known as:  IMDUR Take 1 tablet (60 mg total) by mouth daily.   isosorbide  mononitrate 60 MG 24 hr tablet Commonly known as:  IMDUR Take 1 tablet (60 mg total) by mouth daily.   levothyroxine 125 MCG tablet Commonly known as:  SYNTHROID, LEVOTHROID Take 1 tablet (125 mcg total) by mouth daily before breakfast.   metoprolol succinate 25 MG 24 hr tablet Commonly known as:  TOPROL XL Take 1 tablet (25 mg total) by mouth daily.   mexiletine 200 MG capsule Commonly known as:  MEXITIL Take 200 mg by mouth 3 (three) times daily.   mexiletine 200 MG capsule Commonly known as:  MEXITIL Take 1 capsule (200 mg total) by  mouth every 12 (twelve) hours.   omeprazole 40 MG capsule Commonly known as:  PRILOSEC Take 1 capsule (40 mg total) by mouth daily.   polyethylene glycol packet Commonly known as:  MIRALAX / GLYCOLAX Take 17 g by mouth daily as needed for moderate constipation.   potassium chloride SA 20 MEQ tablet Commonly known as:  K-DUR,KLOR-CON Take 1 tablet (20 mEq total) by mouth daily.   tiotropium 18 MCG inhalation capsule Commonly known as:  SPIRIVA HANDIHALER Place 1 capsule (18 mcg total) into inhaler and inhale daily.          Objective:    BP 111/68   Pulse 79   Temp 98.6 F (37 C) (Oral)   Ht _0  (1.549 m)   Wt 130 lb (59 kg)   BMI 24.56 kg/m   Wt Readings from Last 3 Encounters:  02/13/17 130 lb (59 kg)  12/04/16 123 lb (55.8 kg)  11/20/16 125 lb 6.4 oz (56.9 kg)    Physical Exam  Constitutional: She is oriented to person, place, and time. She appears well-developed and well-nourished. No distress.  Eyes: Conjunctivae are normal.  Neck: Neck supple.  Cardiovascular: Normal rate, regular rhythm, normal heart sounds and intact distal pulses.   No murmur heard. Pulmonary/Chest: Effort normal and breath sounds normal. No respiratory distress. She has no wheezes.  Musculoskeletal: Normal range of motion. She exhibits no edema or tenderness.  Lymphadenopathy:    She has no cervical adenopathy.  Neurological: She is alert and oriented to person, place, and time. Coordination normal.  Skin: Skin is warm and dry. No rash noted. She is not diaphoretic.  Psychiatric: She has a normal mood and affect. Her behavior is normal.  Nursing note and vitals reviewed.     Assessment & Plan:   Problem List Items Addressed This Visit      Cardiovascular and Mediastinum   Essential hypertension, benign - Primary   Relevant Medications   mexiletine (MEXITIL) 200 MG capsule   furosemide (LASIX) 20 MG tablet   hydrALAZINE (APRESOLINE) 50 MG tablet   isosorbide mononitrate (IMDUR)  60 MG 24 hr tablet   potassium chloride SA (K-DUR,KLOR-CON) 20 MEQ tablet   atorvastatin (LIPITOR) 80 MG tablet   metoprolol succinate (TOPROL XL) 25 MG 24 hr tablet   Other Relevant Orders   CMP14+EGFR (Completed)     Endocrine   Hypothyroidism   Relevant Medications   metoprolol succinate (TOPROL XL) 25 MG 24 hr tablet   Other Relevant Orders   CBC with Differential/Platelet (Completed)   TSH (Completed)     Other   Hyperlipidemia   Relevant Medications   mexiletine (MEXITIL) 200 MG capsule   furosemide (LASIX) 20 MG tablet   hydrALAZINE (APRESOLINE) 50 MG tablet   isosorbide mononitrate (IMDUR) 60 MG 24 hr tablet   atorvastatin (LIPITOR) 80 MG tablet   metoprolol succinate (  TOPROL XL) 25 MG 24 hr tablet   Other Relevant Orders   Lipid panel     Follow up plan: Return in about 3 months (around 05/15/2017), or if symptoms worsen or fail to improve, for Recheck CHF and hypertension and thyroid.  Counseling provided for all of the vaccine components Orders Placed This Encounter  Procedures  . CMP14+EGFR  . Lipid panel  . CBC with Differential/Platelet  . TSH   Caryl Pina, MD [facility] 02/13/2017, 2:02 PM

## 2017-02-14 LAB — CBC WITH DIFFERENTIAL/PLATELET
BASOS: 0 %
Basophils Absolute: 0 10*3/uL (ref 0.0–0.2)
EOS (ABSOLUTE): 0.2 10*3/uL (ref 0.0–0.4)
EOS: 2 %
HEMATOCRIT: 27.1 % — AB (ref 34.0–46.6)
Hemoglobin: 8.7 g/dL — CL (ref 11.1–15.9)
Immature Grans (Abs): 0 10*3/uL (ref 0.0–0.1)
Immature Granulocytes: 0 %
LYMPHS ABS: 0.9 10*3/uL (ref 0.7–3.1)
Lymphs: 11 %
MCH: 28.6 pg (ref 26.6–33.0)
MCHC: 32.1 g/dL (ref 31.5–35.7)
MCV: 89 fL (ref 79–97)
MONOS ABS: 0.6 10*3/uL (ref 0.1–0.9)
Monocytes: 7 %
NEUTROS PCT: 80 %
Neutrophils Absolute: 6.2 10*3/uL (ref 1.4–7.0)
PLATELETS: 353 10*3/uL (ref 150–379)
RBC: 3.04 x10E6/uL — AB (ref 3.77–5.28)
RDW: 15 % (ref 12.3–15.4)
WBC: 7.8 10*3/uL (ref 3.4–10.8)

## 2017-02-14 LAB — LIPID PANEL
CHOL/HDL RATIO: 3 ratio (ref 0.0–4.4)
Cholesterol, Total: 124 mg/dL (ref 100–199)
HDL: 41 mg/dL (ref >39)
LDL CALC: 54 mg/dL (ref 0–99)
Triglycerides: 145 mg/dL (ref 0–149)
VLDL Cholesterol Cal: 29 mg/dL (ref 5–40)

## 2017-02-14 LAB — CMP14+EGFR
ALBUMIN: 3.7 g/dL (ref 3.5–4.8)
ALK PHOS: 85 IU/L (ref 39–117)
ALT: 6 IU/L (ref 0–32)
AST: 9 IU/L (ref 0–40)
Albumin/Globulin Ratio: 1.4 (ref 1.2–2.2)
BUN/Creatinine Ratio: 13 (ref 12–28)
BUN: 24 mg/dL (ref 8–27)
Bilirubin Total: <0.2 mg/dL (ref 0.0–1.2)
CO2: 27 mmol/L (ref 18–29)
CREATININE: 1.83 mg/dL — AB (ref 0.57–1.00)
Calcium: 8.8 mg/dL (ref 8.7–10.3)
Chloride: 101 mmol/L (ref 96–106)
GFR calc Af Amer: 30 mL/min/{1.73_m2} — ABNORMAL LOW (ref >59)
GFR calc non Af Amer: 26 mL/min/{1.73_m2} — ABNORMAL LOW (ref >59)
GLOBULIN, TOTAL: 2.7 g/dL (ref 1.5–4.5)
Glucose: 106 mg/dL — ABNORMAL HIGH (ref 65–99)
POTASSIUM: 4.2 mmol/L (ref 3.5–5.2)
SODIUM: 143 mmol/L (ref 134–144)
Total Protein: 6.4 g/dL (ref 6.0–8.5)

## 2017-02-14 LAB — TSH: TSH: 1.9 u[IU]/mL (ref 0.450–4.500)

## 2017-02-19 ENCOUNTER — Encounter: Payer: Self-pay | Admitting: Internal Medicine

## 2017-02-24 ENCOUNTER — Other Ambulatory Visit: Payer: Self-pay | Admitting: Family Medicine

## 2017-02-25 ENCOUNTER — Ambulatory Visit: Payer: Medicare Other

## 2017-03-06 DIAGNOSIS — I10 Essential (primary) hypertension: Secondary | ICD-10-CM | POA: Diagnosis not present

## 2017-03-06 DIAGNOSIS — I255 Ischemic cardiomyopathy: Secondary | ICD-10-CM | POA: Diagnosis not present

## 2017-03-06 DIAGNOSIS — J449 Chronic obstructive pulmonary disease, unspecified: Secondary | ICD-10-CM | POA: Diagnosis not present

## 2017-03-10 ENCOUNTER — Encounter: Payer: Self-pay | Admitting: Internal Medicine

## 2017-03-10 ENCOUNTER — Ambulatory Visit (INDEPENDENT_AMBULATORY_CARE_PROVIDER_SITE_OTHER): Payer: Medicare Other | Admitting: Internal Medicine

## 2017-03-10 ENCOUNTER — Encounter (INDEPENDENT_AMBULATORY_CARE_PROVIDER_SITE_OTHER): Payer: Self-pay

## 2017-03-10 VITALS — BP 138/70 | HR 76 | Ht 61.0 in | Wt 128.4 lb

## 2017-03-10 DIAGNOSIS — I5043 Acute on chronic combined systolic (congestive) and diastolic (congestive) heart failure: Secondary | ICD-10-CM

## 2017-03-10 DIAGNOSIS — I2510 Atherosclerotic heart disease of native coronary artery without angina pectoris: Secondary | ICD-10-CM | POA: Diagnosis not present

## 2017-03-10 DIAGNOSIS — I10 Essential (primary) hypertension: Secondary | ICD-10-CM | POA: Diagnosis not present

## 2017-03-10 DIAGNOSIS — E781 Pure hyperglyceridemia: Secondary | ICD-10-CM

## 2017-03-10 MED ORDER — METOPROLOL SUCCINATE ER 25 MG PO TB24: 25.0000 mg | ORAL_TABLET | Freq: Every day | ORAL | 3 refills | Status: DC

## 2017-03-10 NOTE — Progress Notes (Signed)
Cardiology Office Note   Date:  03/10/2017   ID:  Cruz, Devilla August 10, 1936, MRN [id number]  PCP:  Fransisca Kaufmann Dettinger, MD  Cardiologist:   Dorris Carnes, MD   F/U of CAD and chronic systolic CHF     History of Present Illness Meghan Anderson is a 81 y.o. female with a history of CAD (mild to mod dz except for small D2 at 95%), Chronic systlolc CHF LVEF 30 to 35% (Was 77 to 55% in 2016), COPD., NSVT   I saw her when she was hospitalized earlier this winter.  She diuresed with IV lasix.  She has been seen once bo [facility] in Jan 2018  Since seen she deneis CP  Breathing is OK  No significant edema          Current Meds  Medication Sig  . albuterol (PROVENTIL) (2.5 MG/3ML) 0.083% nebulizer solution NEBULIZE 1 VIAL EVERY 4 HOURS AS NEEDED  . atorvastatin (LIPITOR) 80 MG tablet TAKE ONE (1) TABLET EACH DAY  . fluticasone furoate-vilanterol (BREO ELLIPTA) 100-25 MCG/INH AEPB Inhale 1 puff into the lungs daily.  . furosemide (LASIX) 20 MG tablet Take 1 tablet (20 mg total) by mouth 2 (two) times daily.  . hydrALAZINE (APRESOLINE) 50 MG tablet Take 1 tablet (50 mg total) by mouth 3 (three) times daily.  . isosorbide mononitrate (IMDUR) 60 MG 24 hr tablet Take 1 tablet (60 mg total) by mouth daily.  Marland Kitchen levothyroxine (SYNTHROID, LEVOTHROID) 125 MCG tablet TAKE ONE TABLET EACH MORNING BEFORE BREAKFAST  . metoprolol succinate (TOPROL XL) 25 MG 24 hr tablet Take 1 tablet (25 mg total) by mouth daily.  Marland Kitchen mexiletine (MEXITIL) 200 MG capsule Take 200 mg by mouth 3 (three) times daily.  Marland Kitchen omeprazole (PRILOSEC) 40 MG capsule Take 1 capsule (40 mg total) by mouth daily.  . polyethylene glycol (MIRALAX / GLYCOLAX) packet Take 17 g by mouth daily as needed for moderate constipation.  . potassium chloride SA (K-DUR,KLOR-CON) 20 MEQ tablet Take 1 tablet (20 mEq total) by mouth daily.  Marland Kitchen tiotropium (SPIRIVA HANDIHALER) 18 MCG inhalation capsule Place 1 capsule (18 mcg total) into inhaler and inhale  daily.     Allergies:   Levofloxacin; Moxifloxacin; Prednisone; and Quinolones   Past Medical History:  Diagnosis Date  . Arthritis   . Cataract   . Chronic bronchitis ([place])   . Chronic renal disease, stage 3, moderately decreased glomerular filtration rate (GFR) between 30-59 mL/min/1.73 square meter 02/21/2016  . Complication of anesthesia   . COPD (chronic obstructive pulmonary disease) ([place])   . GERD (gastroesophageal reflux disease)   . Hyperlipidemia   . Hypertension   . Hypothyroidism   . Murmur, heart   . PONV (postoperative nausea and vomiting)   . Rectal adenocarcinoma ([place])   . Thyroid disease     Past Surgical History:  Procedure Laterality Date  . ABDOMINAL HYSTERECTOMY     complete  . CARDIAC CATHETERIZATION N/A 11/18/2016   Procedure: Left Heart Cath and Coronary Angiography;  Surgeon: [name], MD;  Location: [facility];  Service: Cardiovascular;  Laterality: N/A;  . CARDIAC CATHETERIZATION N/A 11/18/2016   Procedure: Right Heart Cath;  Surgeon: [name], MD;  Location: [facility];  Service: Cardiovascular;  Laterality: N/A;  . COLONOSCOPY N/A 11/01/2015   RMR: Large fungating rectal mass precluded colonoscopy. Status post biopsy.   . COLONOSCOPY N/A 01/15/2016   Procedure: COLONOSCOPY;  Surgeon: [name],  MD;  [facility];  Service: Endoscopy;  Laterality: N/A;  130   . FLEXIBLE SIGMOIDOSCOPY N/A 05/17/2016   Procedure: FLEXIBLE SIGMOIDOSCOPY;  Surgeon: [name], MD;  Location: [facility];  Service: Endoscopy;  Laterality: N/A;  . Left ear    . TRANSANAL ENDOSCOPIC MICROSURGERY N/A 12/14/2015   Procedure: TRANSANAL ENDOSCOPIC MICROSURGERY OF RECTAL POLYP;  Surgeon: [name], MD;  Location: [facility];  Service: General;  Laterality: N/A;     Social History:  The patient  reports that she quit smoking about 6 years ago. She has never used smokeless tobacco. She reports that she does not drink alcohol or  use drugs.   Family History:  The patient's family history includes Asthma in her brother; COPD in her brother; Cancer (age of onset: 29) in her brother; Emphysema in her brother; Kidney disease in her mother.    ROS:  Please see the history of present illness. All other systems are reviewed and  Negative to the above problem except as noted.    PHYSICAL EXAM: VS:  BP 138/70   Pulse 76   Ht 5\' 1"  (1.549 m)   Wt 128 lb 6.4 oz (58.2 kg)   SpO2 95%   BMI 24.26 kg/m   GEN: Well nourished, well developed, in no acute distress  HEENT: normal  Neck: no JVD, carotid bruits, or masses Cardiac: RRR; no murmurs, rubs, or gallops,Tr edema  Respiratory:  clear to auscultation bilaterally, normal work of breathing GI: soft, nontender, nondistended, + BS  No hepatomegaly  MS: no deformity Moving all extremities   Skin: warm and dry, no rash Neuro:  Strength and sensation are intact Psych: euthymic mood, full affect   EKG:  EKG is ordered today.   Lipid Panel    Component Value Date/Time   CHOL 124 02/13/2017 1408   TRIG 145 02/13/2017 1408   TRIG 230 (H) 12/29/2014 1155   HDL 41 02/13/2017 1408   HDL 49 12/29/2014 1155   CHOLHDL 3.0 02/13/2017 1408   CHOLHDL 4.3 07/05/2016 1051   VLDL 31 07/05/2016 1051   LDLCALC 54 02/13/2017 1408   LDLCALC 63 08/27/2013 1209      Wt Readings from Last 3 Encounters:  03/10/17 128 lb 6.4 oz (58.2 kg)  02/13/17 130 lb (59 kg)  12/04/16 123 lb (55.8 kg)      ASSESSMENT AND PLAN:  1  CAD  No symptoms of angain  Try to stay active  2.  Chronic systolic CHF  volume status is OK  Keep on same regimen.  3  Hx NSVT  Off of mexilitene  Denies palpitations     4  HL  Continue lipitor  Will plan f/u in the fall   Current medicines are reviewed at length with the patient today.  The patient does not have concerns regarding medicines.  Signed, Dorris Carnes, MD  03/10/2017 5:01 PM    [facility] [place],  [place], [place]  [address] Phone: [phone]; Fax: [phone]

## 2017-03-10 NOTE — Patient Instructions (Signed)
Your physician recommends that you continue on your current medications as directed. Please refer to the Current Medication list given to you today.  Your physician wants you to follow-up in: September, 2018 with Dr. Harrington Challenger.  You will receive a reminder letter in the mail two months in advance. If you don't receive a letter, please call our office to schedule the follow-up appointment.

## 2017-04-08 ENCOUNTER — Ambulatory Visit (INDEPENDENT_AMBULATORY_CARE_PROVIDER_SITE_OTHER): Payer: Medicare Other | Admitting: Family Medicine

## 2017-04-08 ENCOUNTER — Ambulatory Visit (INDEPENDENT_AMBULATORY_CARE_PROVIDER_SITE_OTHER): Payer: Medicare Other

## 2017-04-08 ENCOUNTER — Encounter: Payer: Self-pay | Admitting: Family Medicine

## 2017-04-08 VITALS — BP 132/62 | HR 64 | Temp 98.2°F | Ht 61.0 in | Wt 131.0 lb

## 2017-04-08 DIAGNOSIS — R071 Chest pain on breathing: Secondary | ICD-10-CM

## 2017-04-08 DIAGNOSIS — M25512 Pain in left shoulder: Secondary | ICD-10-CM

## 2017-04-08 MED ORDER — BETAMETHASONE SOD PHOS & ACET 6 (3-3) MG/ML IJ SUSP
6.0000 mg | Freq: Once | INTRAMUSCULAR | Status: AC
  Administered 2017-04-08: 6 mg via INTRAMUSCULAR

## 2017-04-08 NOTE — Progress Notes (Signed)
Subjective:  Patient ID: Meghan Anderson, female    DOB: 13-May-1936  Age: 81 y.o. MRN: [id number]  CC: Back Pain (pt here today c/o upper/mid back pain since Thursday May 24th.)   HPI HAVA MASSINGALE presents for Onset 5 days ago of pain in the left upper back. She points to the area of the scapula and down to the ankle. She says it hurts worse when she lays down on the left side at night. She also says that it hurts worse when she takes a deep breath. She does not have shortness of breath. She denies injury. She is a former smoker   History Krupa has a past medical history of Arthritis; Cataract; Chronic bronchitis ([place]); Chronic renal disease, stage 3, moderately decreased glomerular filtration rate (GFR) between 30-59 mL/min/1.73 square meter (02/21/2016); Complication of anesthesia; COPD (chronic obstructive pulmonary disease) ([place]); GERD (gastroesophageal reflux disease); Hyperlipidemia; Hypertension; Hypothyroidism; Murmur, heart; PONV (postoperative nausea and vomiting); Rectal adenocarcinoma ([place]); and Thyroid disease.   She has a past surgical history that includes Left ear; Abdominal hysterectomy; Colonoscopy (N/A, 11/01/2015); Transanal endoscopic microsurgery (N/A, 12/14/2015); Colonoscopy (N/A, 01/15/2016); Flexible sigmoidoscopy (N/A, 05/17/2016); Cardiac catheterization (N/A, 11/18/2016); and Cardiac catheterization (N/A, 11/18/2016).   Her family history includes Asthma in her brother; COPD in her brother; Cancer (age of onset: 87) in her brother; Emphysema in her brother; Kidney disease in her mother.She reports that she quit smoking about 6 years ago. She has never used smokeless tobacco. She reports that she does not drink alcohol or use drugs.    ROS Review of Systems  Constitutional: Positive for activity change (ue to pain). Negative for appetite change and fever.  HENT: Negative for congestion.   Respiratory: Negative for cough, choking, chest tightness and shortness of breath.    Cardiovascular: Negative for chest pain, palpitations and leg swelling.  Gastrointestinal: Negative for abdominal pain.  Genitourinary: Negative for difficulty urinating.  Musculoskeletal: Positive for myalgias. Negative for arthralgias and joint swelling.    Objective:  BP 132/62   Pulse 64   Temp 98.2 F (36.8 C) (Oral)   Ht 5\' 1"  (1.549 m)   Wt 131 lb (59.4 kg)   BMI 24.75 kg/m   BP Readings from Last 3 Encounters:  04/08/17 132/62  03/10/17 138/70  02/13/17 111/68    Wt Readings from Last 3 Encounters:  04/08/17 131 lb (59.4 kg)  03/10/17 128 lb 6.4 oz (58.2 kg)  02/13/17 130 lb (59 kg)     Physical Exam  Constitutional: She is oriented to person, place, and time. She appears well-developed and well-nourished. No distress.  Pulmonary/Chest: Effort normal. She has wheezes. She exhibits no tenderness.  Musculoskeletal: She exhibits tenderness (4 motion of the left shoulder, primarily abduction and worse over the shoulder.).  Neurological: She is alert and oriented to person, place, and time.  Skin: Skin is warm and dry.  Psychiatric: She has a normal mood and affect.      Assessment & Plan:   Sarena was seen today for back pain.  Diagnoses and all orders for this visit:  Painful respiration -     DG Chest 2 View; Future -     betamethasone acetate-betamethasone sodium phosphate (CELESTONE) injection 6 mg; Inject 1 mL (6 mg total) into the muscle once.  Localized pain of left shoulder joint -     betamethasone acetate-betamethasone sodium phosphate (CELESTONE) injection 6 mg; Inject 1 mL (6 mg total) into the muscle once.  I have discontinued Ms. Dupree's polyethylene glycol. I am also having her maintain her tiotropium, fluticasone furoate-vilanterol, albuterol, mexiletine, furosemide, hydrALAZINE, isosorbide mononitrate, potassium chloride SA, atorvastatin, omeprazole, levothyroxine, and metoprolol succinate. We will continue to administer  betamethasone acetate-betamethasone sodium phosphate.  Allergies as of 04/08/2017      Reactions   Levofloxacin Other (See Comments)   Pt states that this medication was too strong and she could not take them.     Moxifloxacin Rash   Prednisone Itching, Rash   Quinolones Rash      Medication List       Accurate as of 04/08/17  3:56 PM. Always use your most recent med list.          albuterol (2.5 MG/3ML) 0.083% nebulizer solution Commonly known as:  PROVENTIL NEBULIZE 1 VIAL EVERY 4 HOURS AS NEEDED   atorvastatin 80 MG tablet Commonly known as:  LIPITOR TAKE ONE (1) TABLET EACH DAY   BREO ELLIPTA 100-25 MCG/INH Aepb Generic drug:  fluticasone furoate-vilanterol Inhale 1 puff into the lungs daily.   furosemide 20 MG tablet Commonly known as:  LASIX Take 1 tablet (20 mg total) by mouth 2 (two) times daily.   hydrALAZINE 50 MG tablet Commonly known as:  APRESOLINE Take 1 tablet (50 mg total) by mouth 3 (three) times daily.   isosorbide mononitrate 60 MG 24 hr tablet Commonly known as:  IMDUR Take 1 tablet (60 mg total) by mouth daily.   levothyroxine 125 MCG tablet Commonly known as:  SYNTHROID, LEVOTHROID TAKE ONE TABLET EACH MORNING BEFORE BREAKFAST   metoprolol succinate 25 MG 24 hr tablet Commonly known as:  TOPROL XL Take 1 tablet (25 mg total) by mouth daily.   mexiletine 200 MG capsule Commonly known as:  MEXITIL Take 200 mg by mouth 3 (three) times daily.   omeprazole 40 MG capsule Commonly known as:  PRILOSEC Take 1 capsule (40 mg total) by mouth daily.   potassium chloride SA 20 MEQ tablet Commonly known as:  K-DUR,KLOR-CON Take 1 tablet (20 mEq total) by mouth daily.   tiotropium 18 MCG inhalation capsule Commonly known as:  SPIRIVA HANDIHALER Place 1 capsule (18 mcg total) into inhaler and inhale daily.        Follow-up: No Follow-up on file.  Claretta Fraise, M.D.

## 2017-05-15 ENCOUNTER — Other Ambulatory Visit: Payer: Self-pay | Admitting: Family Medicine

## 2017-05-15 DIAGNOSIS — I10 Essential (primary) hypertension: Secondary | ICD-10-CM

## 2017-05-21 ENCOUNTER — Encounter: Payer: Self-pay | Admitting: Family Medicine

## 2017-05-21 ENCOUNTER — Ambulatory Visit (INDEPENDENT_AMBULATORY_CARE_PROVIDER_SITE_OTHER): Payer: Medicare Other | Admitting: Family Medicine

## 2017-05-21 VITALS — BP 148/73 | HR 74 | Temp 97.5°F | Ht 61.0 in | Wt 132.0 lb

## 2017-05-21 DIAGNOSIS — N3000 Acute cystitis without hematuria: Secondary | ICD-10-CM | POA: Diagnosis not present

## 2017-05-21 DIAGNOSIS — R109 Unspecified abdominal pain: Secondary | ICD-10-CM | POA: Diagnosis not present

## 2017-05-21 LAB — URINALYSIS
Bilirubin, UA: NEGATIVE
Glucose, UA: NEGATIVE
Ketones, UA: NEGATIVE
NITRITE UA: NEGATIVE
PH UA: 6 (ref 5.0–7.5)
RBC, UA: NEGATIVE
Specific Gravity, UA: 1.015 (ref 1.005–1.030)
UUROB: 0.2 mg/dL (ref 0.2–1.0)

## 2017-05-21 MED ORDER — SULFAMETHOXAZOLE-TRIMETHOPRIM 800-160 MG PO TABS: 1.0000 | ORAL_TABLET | Freq: Two times a day (BID) | ORAL | 0 refills | Status: DC

## 2017-05-21 NOTE — Progress Notes (Signed)
BP (!) 148/73   Pulse 74   Temp (!) 97.5 F (36.4 C) (Oral)   Ht 5\' 1"  (1.549 m)   Wt 132 lb (59.9 kg)   BMI 24.94 kg/m    Subjective:    Patient ID: TRUC WINFREE, female    DOB: 01-03-1936, 81 y.o.   MRN: [id number]  HPI: Meghan Anderson is a 81 y.o. female presenting on 05/21/2017 for Abdominal Pain   HPI Lower abdominal pain and flank pain Patient has been having left lower abdominal pain and flank pain. She states that it started out in her left lower abdomen near her groin and now it is more higher on that left side and around to her flank. She denies any fevers or chills or constipation or diarrhea or nausea or vomiting. She has been having some urinary frequency but no dysuria. She denies any hematuria. She thinks she may have an infection but she is also concerned about stones, she has had kidney stones previously or if she also has had rectal cancer and treatment for that to the pelvis  Relevant past medical, surgical, family and social history reviewed and updated as indicated. Interim medical history since our last visit reviewed. Allergies and medications reviewed and updated.  Review of Systems  Constitutional: Negative for chills and fever.  HENT: Negative for congestion, ear discharge and ear pain.   Eyes: Negative for redness and visual disturbance.  Respiratory: Negative for chest tightness and shortness of breath.   Cardiovascular: Negative for chest pain and leg swelling.  Gastrointestinal: Positive for abdominal pain. Negative for blood in stool, constipation, diarrhea, nausea and vomiting.  Genitourinary: Positive for flank pain and frequency. Negative for difficulty urinating and dysuria.  Musculoskeletal: Negative for back pain and gait problem.  Skin: Negative for rash.  Neurological: Negative for light-headedness and headaches.  Psychiatric/Behavioral: Negative for agitation and behavioral problems.  All other systems reviewed and are  negative.   Per HPI unless specifically indicated above        Objective:    BP (!) 148/73   Pulse 74   Temp (!) 97.5 F (36.4 C) (Oral)   Ht 5\' 1"  (1.549 m)   Wt 132 lb (59.9 kg)   BMI 24.94 kg/m   Wt Readings from Last 3 Encounters:  05/21/17 132 lb (59.9 kg)  04/08/17 131 lb (59.4 kg)  03/10/17 128 lb 6.4 oz (58.2 kg)    Physical Exam  Constitutional: She is oriented to person, place, and time. She appears well-developed and well-nourished. No distress.  Eyes: Conjunctivae are normal.  Cardiovascular: Normal rate, regular rhythm, normal heart sounds and intact distal pulses.   No murmur heard. Pulmonary/Chest: Effort normal and breath sounds normal. No respiratory distress. She has no wheezes.  Abdominal: Soft. Bowel sounds are normal. She exhibits no distension. There is tenderness (Left upper quadrant tenderness, no CVA tenderness, mild tenderness). There is no rebound and no guarding.  Musculoskeletal: Normal range of motion. She exhibits no edema or tenderness.  Neurological: She is alert and oriented to person, place, and time. Coordination normal.  Skin: Skin is warm and dry. No rash noted. She is not diaphoretic.  Psychiatric: She has a normal mood and affect. Her behavior is normal.  Nursing note and vitals reviewed.   Urine dip: 1+ leukocytes and 2+ protein, otherwise normal    Assessment & Plan:   Problem List Items Addressed This Visit    None    Visit Diagnoses  Acute cystitis without hematuria    -  Primary   Relevant Medications   sulfamethoxazole-trimethoprim (BACTRIM DS,SEPTRA DS) 800-160 MG tablet   Other Relevant Orders   Urinalysis   Left flank pain       Relevant Medications   sulfamethoxazole-trimethoprim (BACTRIM DS,SEPTRA DS) 800-160 MG tablet   Other Relevant Orders   CT RENAL STONE STUDY       Follow up plan: Return if symptoms worsen or fail to improve.  Counseling provided for all of the vaccine components Orders Placed  This Encounter  Procedures  . CT RENAL STONE STUDY  . Urinalysis    Caryl Pina, MD [facility] 05/21/2017, 5:25 PM

## 2017-05-22 ENCOUNTER — Other Ambulatory Visit: Payer: Self-pay

## 2017-05-22 ENCOUNTER — Ambulatory Visit (HOSPITAL_COMMUNITY)
Admission: RE | Admit: 2017-05-22 | Discharge: 2017-05-22 | Disposition: A | Discharge status: Home / Self Care | Payer: Medicare Other | Source: Ambulatory Visit | Attending: Family Medicine | Admitting: Family Medicine

## 2017-05-22 ENCOUNTER — Other Ambulatory Visit: Payer: Self-pay | Admitting: Family Medicine

## 2017-05-22 DIAGNOSIS — X58XXXA Exposure to other specified factors, initial encounter: Secondary | ICD-10-CM | POA: Diagnosis not present

## 2017-05-22 DIAGNOSIS — K5730 Diverticulosis of large intestine without perforation or abscess without bleeding: Secondary | ICD-10-CM | POA: Insufficient documentation

## 2017-05-22 DIAGNOSIS — R109 Unspecified abdominal pain: Secondary | ICD-10-CM | POA: Diagnosis present

## 2017-05-22 DIAGNOSIS — S32502A Unspecified fracture of left pubis, initial encounter for closed fracture: Secondary | ICD-10-CM | POA: Diagnosis not present

## 2017-05-22 DIAGNOSIS — I700 Atherosclerosis of aorta: Secondary | ICD-10-CM | POA: Diagnosis not present

## 2017-05-22 DIAGNOSIS — S3282XA Multiple fractures of pelvis without disruption of pelvic ring, initial encounter for closed fracture: Secondary | ICD-10-CM

## 2017-05-22 MED ORDER — HYDROCODONE-ACETAMINOPHEN 5-325 MG PO TABS: 1.0000 | ORAL_TABLET | Freq: Four times a day (QID) | ORAL | 0 refills | Status: DC | PRN

## 2017-05-23 ENCOUNTER — Encounter: Payer: Self-pay | Admitting: Family Medicine

## 2017-05-23 ENCOUNTER — Ambulatory Visit (INDEPENDENT_AMBULATORY_CARE_PROVIDER_SITE_OTHER): Payer: Medicare Other | Admitting: Family Medicine

## 2017-05-23 VITALS — BP 152/69 | HR 82 | Temp 97.9°F | Ht 61.0 in | Wt 131.0 lb

## 2017-05-23 DIAGNOSIS — B029 Zoster without complications: Secondary | ICD-10-CM | POA: Diagnosis not present

## 2017-05-23 MED ORDER — VALACYCLOVIR HCL 1 G PO TABS: 1000.0000 mg | ORAL_TABLET | Freq: Every day | ORAL | 0 refills | Status: DC

## 2017-05-23 MED ORDER — METHYLPREDNISOLONE ACETATE 80 MG/ML IJ SUSP
80.0000 mg | Freq: Once | INTRAMUSCULAR | Status: AC
  Administered 2017-05-23: 80 mg via INTRAMUSCULAR

## 2017-05-23 NOTE — Addendum Note (Signed)
Addended by: Karle Plumber on: 05/23/2017 03:56 PM   Modules accepted: Orders

## 2017-05-23 NOTE — Patient Instructions (Signed)
Great to see you!  Take 1 pill once daily of valtrex until they are gone.    Shingles Shingles is an infection that causes a painful skin rash and fluid-filled blisters. Shingles is caused by the same virus that causes chickenpox. Shingles only develops in people who:  Have had chickenpox.  Have gotten the chickenpox vaccine. (This is rare.)  The first symptoms of shingles may be itching, tingling, or pain in an area on your skin. A rash will follow in a few days or weeks. The rash is usually on one side of the body in a bandlike or beltlike pattern. Over time, the rash turns into fluid-filled blisters that break open, scab over, and dry up. Medicines may:  Help you manage pain.  Help you recover more quickly.  Help to prevent long-term problems.  Follow these instructions at home: Medicines  Take medicines only as told by your doctor.  Apply an anti-itch or numbing cream to the affected area as told by your doctor. Blister and Rash Care  Take a cool bath or put cool compresses on the area of the rash or blisters as told by your doctor. This may help with pain and itching.  Keep your rash covered with a loose bandage (dressing). Wear loose-fitting clothing.  Keep your rash and blisters clean with mild soap and cool water or as told by your doctor.  Check your rash every day for signs of infection. These include redness, swelling, and pain that lasts or gets worse.  Do not pick your blisters.  Do not scratch your rash. General instructions  Rest as told by your doctor.  Keep all follow-up visits as told by your doctor. This is important.  Until your blisters scab over, your infection can cause chickenpox in people who have never had it or been vaccinated against it. To prevent this from happening, avoid touching other people or being around other people, especially: ? Babies. ? Pregnant women. ? Children who have eczema. ? Elderly people who have  transplants. ? People who have chronic illnesses, such as leukemia or AIDS. Contact a doctor if:  Your pain does not get better with medicine.  Your pain does not get better after the rash heals.  Your rash looks infected. Signs of infection include: ? Redness. ? Swelling. ? Pain that lasts or gets worse. Get help right away if:  The rash is on your face or nose.  You have pain in your face, pain around your eye area, or loss of feeling on one side of your face.  You have ear pain or you have ringing in your ear.  You have loss of taste.  Your condition gets worse. This information is not intended to replace advice given to you by your health care provider. Make sure you discuss any questions you have with your health care provider. Document Released: 04/15/2008 Document Revised: 06/23/2016 Document Reviewed: 08/09/2014 Elsevier Interactive Patient Education  [employer].

## 2017-05-23 NOTE — Progress Notes (Signed)
   HPI  Patient presents today for rash.  Patient states that she's had 3-4 days with burning flank pain. She states that she has stopped taking the antibiotics prescribed by one of my partners. She had a negative CT renal stone study. She developed a rash last night. Food and fluids like usual. She denies any additional concerns or symptoms.  PMH: Smoking status noted ROS: Per HPI  Objective: BP (!) 152/69   Pulse 82   Temp 97.9 F (36.6 C) (Oral)   Ht 5\' 1"  (1.549 m)   Wt 131 lb (59.4 kg)   BMI 24.75 kg/m  Gen: NAD, alert, cooperative with exam HEENT: NCAT CV: RRR, good S1/S2, no murmur Resp: CTABL, no wheezes, non-labored Ext: No edema, warm Neuro: Alert and oriented, No gross deficits  Skin:  Left flank with erythematous vesicular rash developing starting in the midline left-sided lumbar area extending around to the midclavicular line on the abdomen  Assessment and plan:  # Shingles Treat with Valtrex, renally dosed, creatinine clearance is 23, 1 g daily Patient does not want pain medications or pills. IM Depo-Medrol has been given for pain. RTC with any concerns    Meds ordered this encounter  Medications  . valACYclovir (VALTREX) 1000 MG tablet    Sig: Take 1 tablet (1,000 mg total) by mouth daily.    Dispense:  10 tablet    Refill:  0    Laroy Apple, MD [facility] 05/23/2017, 3:51 PM

## 2017-06-16 ENCOUNTER — Telehealth: Payer: Self-pay | Admitting: Family Medicine

## 2017-06-16 MED ORDER — TRAMADOL HCL 50 MG PO TABS: 50.0000 mg | ORAL_TABLET | Freq: Three times a day (TID) | ORAL | 0 refills | Status: DC | PRN

## 2017-06-16 NOTE — Telephone Encounter (Signed)
Please call in Tramadol 50 mg 3 times a day when necessary and give her 21 of them

## 2017-07-28 ENCOUNTER — Ambulatory Visit (INDEPENDENT_AMBULATORY_CARE_PROVIDER_SITE_OTHER): Payer: Medicare Other | Admitting: Internal Medicine

## 2017-07-28 ENCOUNTER — Encounter: Payer: Self-pay | Admitting: Internal Medicine

## 2017-07-28 VITALS — BP 148/86 | HR 57 | Ht 61.0 in | Wt 128.8 lb

## 2017-07-28 DIAGNOSIS — I10 Essential (primary) hypertension: Secondary | ICD-10-CM | POA: Diagnosis not present

## 2017-07-28 DIAGNOSIS — E782 Mixed hyperlipidemia: Secondary | ICD-10-CM

## 2017-07-28 DIAGNOSIS — I2510 Atherosclerotic heart disease of native coronary artery without angina pectoris: Secondary | ICD-10-CM | POA: Diagnosis not present

## 2017-07-28 DIAGNOSIS — I255 Ischemic cardiomyopathy: Secondary | ICD-10-CM | POA: Diagnosis not present

## 2017-07-28 MED ORDER — METOPROLOL SUCCINATE ER 25 MG PO TB24: 25.0000 mg | ORAL_TABLET | Freq: Every day | ORAL | 3 refills | Status: DC

## 2017-07-28 MED ORDER — ISOSORBIDE MONONITRATE ER 60 MG PO TB24: ORAL_TABLET | ORAL | 3 refills | Status: DC

## 2017-07-28 MED ORDER — FUROSEMIDE 20 MG PO TABS: 20.0000 mg | ORAL_TABLET | Freq: Two times a day (BID) | ORAL | 3 refills | Status: DC

## 2017-07-28 MED ORDER — HYDRALAZINE HCL 50 MG PO TABS: 75.0000 mg | ORAL_TABLET | Freq: Three times a day (TID) | ORAL | 1 refills | Status: DC

## 2017-07-28 NOTE — Patient Instructions (Signed)
Plan to increase Hydralazine to 75 mg 3x per day  Blood work today (BMET, BNP)  Wll arrange for echocardiogram of heart to assess pumping function  Will arrange for f/u in clniic in 6 to 8 wks to reassess blood pressure  Watch salt.

## 2017-07-28 NOTE — Progress Notes (Signed)
Cardiology Office Note   Date:  07/28/2017   ID:  Meghan Anderson, DOB 10-27-1936, MRN [id number]  PCP:  Dettinger, Fransisca Kaufmann, MD  Cardiologist:   Dorris Carnes, MD   Pt presents for f/u of CAD and chronic systolic CHF     History of Present Illness Meghan Anderson is a 81 y.o. female with a history of CAD (mild to mod dz except for small D2 at 95%), Chronic systlolc CHF LVEF 30 to 35% (Was 82 to 55% in 2016), COPD., NSVT   I saw her when she was hospitalized earlier this winter.  She diuresed with IV lasix.  She has been seen once bo B Bhagat in Jan 2018  I saw the pt in April 2018  She was doing OK at that time    Since seen she has done OK  Breathing OK  No CP   Says she "likes her salt"           Current Meds  Medication Sig  . albuterol (PROVENTIL) (2.5 MG/3ML) 0.083% nebulizer solution NEBULIZE 1 VIAL EVERY 4 HOURS AS NEEDED  . atorvastatin (LIPITOR) 80 MG tablet TAKE ONE (1) TABLET EACH DAY  . fluticasone furoate-vilanterol (BREO ELLIPTA) 100-25 MCG/INH AEPB Inhale 1 puff into the lungs daily.  . furosemide (LASIX) 20 MG tablet TAKE ONE TABLET BY MOUTH TWICE DAILY  . hydrALAZINE (APRESOLINE) 50 MG tablet Take 1 tablet (50 mg total) by mouth 3 (three) times daily.  . isosorbide mononitrate (IMDUR) 60 MG 24 hr tablet TAKE ONE (1) TABLET EACH DAY  . levothyroxine (SYNTHROID, LEVOTHROID) 125 MCG tablet TAKE ONE TABLET EACH MORNING BEFORE BREAKFAST  . metoprolol succinate (TOPROL XL) 25 MG 24 hr tablet Take 1 tablet (25 mg total) by mouth daily.  Marland Kitchen mexiletine (MEXITIL) 200 MG capsule Take 200 mg by mouth 3 (three) times daily.  Marland Kitchen omeprazole (PRILOSEC) 40 MG capsule Take 1 capsule (40 mg total) by mouth daily.  . potassium chloride SA (K-DUR,KLOR-CON) 20 MEQ tablet Take 1 tablet (20 mEq total) by mouth daily.  Marland Kitchen tiotropium (SPIRIVA HANDIHALER) 18 MCG inhalation capsule Place 1 capsule (18 mcg total) into inhaler and inhale daily.  . traMADol (ULTRAM) 50 MG tablet Take 1 tablet  (50 mg total) by mouth every 8 (eight) hours as needed.  . valACYclovir (VALTREX) 1000 MG tablet Take 1 tablet (1,000 mg total) by mouth daily.     Allergies:   Levofloxacin; Moxifloxacin; Prednisone; and Quinolones   Past Medical History:  Diagnosis Date  . Arthritis   . Cataract   . Chronic bronchitis ([place])   . Chronic renal disease, stage 3, moderately decreased glomerular filtration rate (GFR) between 30-59 mL/min/1.73 square meter 02/21/2016  . Complication of anesthesia   . COPD (chronic obstructive pulmonary disease) ([place])   . GERD (gastroesophageal reflux disease)   . Hyperlipidemia   . Hypertension   . Hypothyroidism   . Murmur, heart   . PONV (postoperative nausea and vomiting)   . Rectal adenocarcinoma ([place])   . Thyroid disease     Past Surgical History:  Procedure Laterality Date  . ABDOMINAL HYSTERECTOMY     complete  . CARDIAC CATHETERIZATION N/A 11/18/2016   Procedure: Left Heart Cath and Coronary Angiography;  Surgeon: [name], MD;  Location: [facility];  Service: Cardiovascular;  Laterality: N/A;  . CARDIAC CATHETERIZATION N/A 11/18/2016   Procedure: Right Heart Cath;  Surgeon: [name], MD;  Location: [facility];  Service: Cardiovascular;  Laterality: N/A;  . COLONOSCOPY N/A 11/01/2015   RMR: Large fungating rectal mass precluded colonoscopy. Status post biopsy.   . COLONOSCOPY N/A 01/15/2016   Procedure: COLONOSCOPY;  Surgeon: [name], MD;  [facility];  Service: Endoscopy;  Laterality: N/A;  130   . FLEXIBLE SIGMOIDOSCOPY N/A 05/17/2016   Procedure: FLEXIBLE SIGMOIDOSCOPY;  Surgeon: [name], MD;  [facility];  Service: Endoscopy;  Laterality: N/A;  . Left ear    . TRANSANAL ENDOSCOPIC MICROSURGERY N/A 12/14/2015   Procedure: TRANSANAL ENDOSCOPIC MICROSURGERY OF RECTAL POLYP;  Surgeon: [name], MD;  [facility];  Service: General;  Laterality: N/A;     Social History:  The  patient  reports that she quit smoking about 6 years ago. She has never used smokeless tobacco. She reports that she does not drink alcohol or use drugs.   Family History:  The patient's family history includes Asthma in her brother; COPD in her brother; Cancer (age of onset: 46) in her brother; Emphysema in her brother; Kidney disease in her mother.    ROS:  Please see the history of present illness. All other systems are reviewed and  Negative to the above problem except as noted.    PHYSICAL EXAM: VS:  BP (!) 148/86   Pulse (!) 57   Ht 5\' 1"  (1.549 m)   Wt 128 lb 12.8 oz (58.4 kg)   SpO2 97%   BMI 24.34 kg/m   GEN: Well nourished, well developed, in no acute distress  HEENT: normal  Neck: no JVD, carotid bruits, or masses Cardiac: RRR;  No murmurs, rubs, or gallops,Tr edema  Respiratory:  clear to auscultation bilaterally, normal work of breathing GI: soft, nontender, nondistended, + BS  No hepatomegaly  MS: no deformity Moving all extremities   Skin: warm and dry, no rash Neuro:  Strength and sensation are intact   EKG:  EKG is not  ordered today.   Lipid Panel    Component Value Date/Time   CHOL 124 02/13/2017 1408   TRIG 145 02/13/2017 1408   TRIG 230 (H) 12/29/2014 1155   HDL 41 02/13/2017 1408   HDL 49 12/29/2014 1155   CHOLHDL 3.0 02/13/2017 1408   CHOLHDL 4.3 07/05/2016 1051   VLDL 31 07/05/2016 1051   LDLCALC 54 02/13/2017 1408   LDLCALC 63 08/27/2013 1209      Wt Readings from Last 3 Encounters:  07/28/17 128 lb 12.8 oz (58.4 kg)  05/23/17 131 lb (59.4 kg)  05/21/17 132 lb (59.9 kg)      ASSESSMENT AND PLAN:  1  CAD  No sympotms to sugg angina   2.  Chronic systolic CHF Volume status appears pretty good  I would recomm echo to reeval LVEF   Continue current meds for now  Increase dose of hydralazine   Watch salt  3  Hx NSVT  Off of mexilitene  4  HL  Keep on statin    Will plan f/u later this fall to reeval BP and discuss meds     Current  medicines are reviewed at length with the patient today.  The patient does not have concerns regarding medicines.  Signed, Dorris Carnes, MD  07/28/2017 4:00 PM    [facility] [address], [place], [place]  [address] Phone: [phone]; Fax: [phone]

## 2017-07-29 ENCOUNTER — Telehealth: Payer: Self-pay | Admitting: Internal Medicine

## 2017-07-29 DIAGNOSIS — R7989 Other specified abnormal findings of blood chemistry: Secondary | ICD-10-CM

## 2017-07-29 DIAGNOSIS — I10 Essential (primary) hypertension: Secondary | ICD-10-CM

## 2017-07-29 LAB — BASIC METABOLIC PANEL
BUN / CREAT RATIO: 16 (ref 12–28)
BUN: 29 mg/dL — AB (ref 8–27)
CHLORIDE: 99 mmol/L (ref 96–106)
CO2: 23 mmol/L (ref 20–29)
CREATININE: 1.85 mg/dL — AB (ref 0.57–1.00)
Calcium: 9.8 mg/dL (ref 8.7–10.3)
GFR calc Af Amer: 29 mL/min/{1.73_m2} — ABNORMAL LOW (ref >59)
GFR calc non Af Amer: 26 mL/min/{1.73_m2} — ABNORMAL LOW (ref >59)
GLUCOSE: 99 mg/dL (ref 65–99)
Potassium: 4.4 mmol/L (ref 3.5–5.2)
Sodium: 141 mmol/L (ref 134–144)

## 2017-07-29 LAB — PRO B NATRIURETIC PEPTIDE: NT-PRO BNP: 4410 pg/mL — AB (ref 0–738)

## 2017-07-29 MED ORDER — FUROSEMIDE 20 MG PO TABS: 40.0000 mg | ORAL_TABLET | Freq: Every day | ORAL | Status: DC

## 2017-07-29 NOTE — Telephone Encounter (Signed)
New message    Pt is returning call to nurse about lab work.

## 2017-07-29 NOTE — Telephone Encounter (Signed)
Notes recorded by Fay Records, MD on 07/29/2017 at 11:41 AM EDT Kidney function is stable  Fluid is up some  I would recomm trying taking lasix as 2 tabs (40 mg ) 1x per day F?U BMET and BNP in 10 days  Called patient's son with lab results. Patient will start taking Lasix 40 mg by mouth daily, instead of taking Lasix 20 mg by mouth twice daily. Patient is coming on 08/11/17 for repeat lab work, this was the first day son would be able to bring patient in. Also, patient's son wanted Dr. Harrington Challenger to know that patient was still taking Mexiletine, and he wants to know if this medication needs to be discontinued.

## 2017-07-29 NOTE — Telephone Encounter (Signed)
Mr.Rodick is calling to let you know that Meghan Anderson is still taking the Mexiletine and wants to confirm that Dr. Harrington Challenger is wanting her to stop taking this medication. Please call at [phone]

## 2017-07-31 ENCOUNTER — Other Ambulatory Visit: Payer: Self-pay | Admitting: Internal Medicine

## 2017-07-31 ENCOUNTER — Other Ambulatory Visit: Payer: Self-pay | Admitting: *Deleted

## 2017-07-31 DIAGNOSIS — I255 Ischemic cardiomyopathy: Secondary | ICD-10-CM

## 2017-08-06 ENCOUNTER — Ambulatory Visit (INDEPENDENT_AMBULATORY_CARE_PROVIDER_SITE_OTHER): Payer: Medicare Other

## 2017-08-06 ENCOUNTER — Other Ambulatory Visit: Payer: Self-pay

## 2017-08-06 DIAGNOSIS — I255 Ischemic cardiomyopathy: Secondary | ICD-10-CM

## 2017-08-11 ENCOUNTER — Other Ambulatory Visit: Payer: Medicare Other

## 2017-08-11 DIAGNOSIS — R7989 Other specified abnormal findings of blood chemistry: Secondary | ICD-10-CM

## 2017-08-11 LAB — BASIC METABOLIC PANEL
BUN / CREAT RATIO: 15 (ref 12–28)
BUN: 26 mg/dL (ref 8–27)
CO2: 26 mmol/L (ref 20–29)
Calcium: 9 mg/dL (ref 8.7–10.3)
Chloride: 104 mmol/L (ref 96–106)
Creatinine, Ser: 1.74 mg/dL — ABNORMAL HIGH (ref 0.57–1.00)
GFR, EST AFRICAN AMERICAN: 32 mL/min/{1.73_m2} — AB (ref >59)
GFR, EST NON AFRICAN AMERICAN: 27 mL/min/{1.73_m2} — AB (ref >59)
Glucose: 100 mg/dL — ABNORMAL HIGH (ref 65–99)
Potassium: 4.8 mmol/L (ref 3.5–5.2)
SODIUM: 144 mmol/L (ref 134–144)

## 2017-08-11 LAB — PRO B NATRIURETIC PEPTIDE: NT-Pro BNP: 3626 pg/mL — ABNORMAL HIGH (ref 0–738)

## 2017-08-15 ENCOUNTER — Other Ambulatory Visit: Payer: Self-pay | Admitting: Family Medicine

## 2017-08-15 ENCOUNTER — Telehealth: Payer: Self-pay | Admitting: Internal Medicine

## 2017-08-15 ENCOUNTER — Other Ambulatory Visit: Payer: Self-pay | Admitting: *Deleted

## 2017-08-15 DIAGNOSIS — I10 Essential (primary) hypertension: Secondary | ICD-10-CM

## 2017-08-15 MED ORDER — FUROSEMIDE 20 MG PO TABS: 40.0000 mg | ORAL_TABLET | Freq: Every day | ORAL | 3 refills | Status: DC

## 2017-08-15 MED ORDER — HYDRALAZINE HCL 50 MG PO TABS: 75.0000 mg | ORAL_TABLET | Freq: Three times a day (TID) | ORAL | 3 refills | Status: DC

## 2017-08-15 MED ORDER — HYDRALAZINE HCL 50 MG PO TABS
75.0000 mg | ORAL_TABLET | Freq: Three times a day (TID) | ORAL | 3 refills | Status: DC
Start: 2017-08-15 — End: 2018-01-02

## 2017-08-15 NOTE — Telephone Encounter (Signed)
°*  STAT* If patient is at the pharmacy, call can be transferred to refill team.   1. Which medications need to be refilled? (please list name of each medication and dose if known) lasix and hydriazine   2. Which pharmacy/location (including street and city if local pharmacy) is medication to be sent to? [facility], [place]   3. Do they need a 30 day or 90 day supply? [place]

## 2017-08-15 NOTE — Telephone Encounter (Signed)
Pt's medication was sent to pt's pharmacy as requested. Confirmation received.  °

## 2017-09-08 DIAGNOSIS — J449 Chronic obstructive pulmonary disease, unspecified: Secondary | ICD-10-CM | POA: Diagnosis not present

## 2017-09-08 DIAGNOSIS — I10 Essential (primary) hypertension: Secondary | ICD-10-CM | POA: Diagnosis not present

## 2017-09-08 DIAGNOSIS — R0600 Dyspnea, unspecified: Secondary | ICD-10-CM | POA: Diagnosis not present

## 2017-09-08 DIAGNOSIS — I255 Ischemic cardiomyopathy: Secondary | ICD-10-CM | POA: Diagnosis not present

## 2017-09-08 DIAGNOSIS — Z23 Encounter for immunization: Secondary | ICD-10-CM | POA: Diagnosis not present

## 2017-09-22 ENCOUNTER — Ambulatory Visit: Payer: Medicare Other | Admitting: Internal Medicine

## 2017-09-22 ENCOUNTER — Encounter (INDEPENDENT_AMBULATORY_CARE_PROVIDER_SITE_OTHER): Payer: Self-pay

## 2017-09-22 ENCOUNTER — Encounter: Payer: Self-pay | Admitting: Internal Medicine

## 2017-09-22 VITALS — BP 146/86 | HR 80 | Ht 61.0 in | Wt 133.8 lb

## 2017-09-22 DIAGNOSIS — E782 Mixed hyperlipidemia: Secondary | ICD-10-CM

## 2017-09-22 DIAGNOSIS — I5043 Acute on chronic combined systolic (congestive) and diastolic (congestive) heart failure: Secondary | ICD-10-CM

## 2017-09-22 DIAGNOSIS — I2510 Atherosclerotic heart disease of native coronary artery without angina pectoris: Secondary | ICD-10-CM | POA: Diagnosis not present

## 2017-09-22 NOTE — Progress Notes (Signed)
Cardiology Office Note   Date:  09/22/2017   ID:  Meghan Anderson, DOB [birth year], MRN [id number]  PCP:  Dettinger, Fransisca Kaufmann, MD  Cardiologist:   Dorris Carnes, MD   Pt presents for f/u of CAD and chronic systolic CHF     History of Present Illness Meghan Anderson is a 81 y.o. female with a history of CAD (mild to mod dz except for small D2 at 95%), Chronic systlolc CHF LVEF 30 to 35% (Was 14 to 55% in 2016), COPD., NSVT   I saw her when she was hospitalized earlier this winter.  She diuresed with IV lasix.  She has been seen once bo B Bhagat in Jan 2018  I saw the pt in in September 2018  I recomm that she increase hydralazine   Since then she says she has been doing OK  Breathing is OK  No PND  No CP  No signif edeam        Current Meds  Medication Sig  . albuterol (PROVENTIL) (2.5 MG/3ML) 0.083% nebulizer solution NEBULIZE 1 VIAL EVERY 4 HOURS AS NEEDED  . atorvastatin (LIPITOR) 80 MG tablet TAKE ONE (1) TABLET EACH DAY  . fluticasone furoate-vilanterol (BREO ELLIPTA) 100-25 MCG/INH AEPB Inhale 1 puff into the lungs daily.  . furosemide (LASIX) 20 MG tablet Take 2 tablets (40 mg total) by mouth daily.  . hydrALAZINE (APRESOLINE) 50 MG tablet Take 1.5 tablets (75 mg total) by mouth 3 (three) times daily.  . isosorbide mononitrate (IMDUR) 60 MG 24 hr tablet TAKE ONE (1) TABLET EACH DAY  . levothyroxine (SYNTHROID, LEVOTHROID) 125 MCG tablet TAKE ONE TABLET EACH MORNING BEFORE BREAKFAST  . metoprolol succinate (TOPROL XL) 25 MG 24 hr tablet Take 1 tablet (25 mg total) by mouth daily.  Marland Kitchen omeprazole (PRILOSEC) 40 MG capsule TAKE ONE (1) CAPSULE EACH DAY  . potassium chloride SA (K-DUR,KLOR-CON) 20 MEQ tablet Take 1 tablet (20 mEq total) by mouth daily.  Marland Kitchen tiotropium (SPIRIVA HANDIHALER) 18 MCG inhalation capsule Place 1 capsule (18 mcg total) into inhaler and inhale daily.  . traMADol (ULTRAM) 50 MG tablet Take 1 tablet (50 mg total) by mouth every 8 (eight) hours as needed.  .  valACYclovir (VALTREX) 1000 MG tablet Take 1 tablet (1,000 mg total) by mouth daily.     Allergies:   Levofloxacin; Moxifloxacin; Prednisone; and Quinolones   Past Medical History:  Diagnosis Date  . Arthritis   . Cataract   . Chronic bronchitis ([place])   . Chronic renal disease, stage 3, moderately decreased glomerular filtration rate (GFR) between 30-59 mL/min/1.73 square meter (HCC) 02/21/2016  . Complication of anesthesia   . COPD (chronic obstructive pulmonary disease) ([place])   . GERD (gastroesophageal reflux disease)   . Hyperlipidemia   . Hypertension   . Hypothyroidism   . Murmur, heart   . PONV (postoperative nausea and vomiting)   . Rectal adenocarcinoma ([place])   . Thyroid disease     Past Surgical History:  Procedure Laterality Date  . ABDOMINAL HYSTERECTOMY     complete  . Left ear       Social History:  The patient  reports that she quit smoking about 6 years ago. she has never used smokeless tobacco. She reports that she does not drink alcohol or use drugs.   Family History:  The patient's family history includes Asthma in her brother; COPD in her brother; Cancer (age of onset: 36) in her brother; Emphysema in her brother; Kidney  disease in her mother.    ROS:  Please see the history of present illness. All other systems are reviewed and  Negative to the above problem except as noted.    PHYSICAL EXAM: VS:  BP (!) 146/86   Pulse 80   Ht 5\' 1"  (1.549 m)   Wt 133 lb 12.8 oz (60.7 kg)   SpO2 (!) 89%   BMI 25.28 kg/m   GEN: Well nourished, well developed, in no acute distress  HEENT: normal  Neck: JVP is normal  No , carotid bruits  Cardiac: RRR;  No murmurs, rubs, or gallops,Trivial  edema  Respiratory:  clear to auscultation bilaterally, normal work of breathing GI: soft, nontender, nondistended, + BS  No hepatomegaly  MS: no deformity Moving all extremities   Skin: warm and dry, no rash Neuro:  Strength and sensation are intact   EKG:  EKG is not   ordered today.   Lipid Panel    Component Value Date/Time   CHOL 124 02/13/2017 1408   TRIG 145 02/13/2017 1408   TRIG 230 (H) 12/29/2014 1155   HDL 41 02/13/2017 1408   HDL 49 12/29/2014 1155   CHOLHDL 3.0 02/13/2017 1408   CHOLHDL 4.3 07/05/2016 1051   VLDL 31 07/05/2016 1051   LDLCALC 54 02/13/2017 1408   LDLCALC 63 08/27/2013 1209      Wt Readings from Last 3 Encounters:  09/22/17 133 lb 12.8 oz (60.7 kg)  07/28/17 128 lb 12.8 oz (58.4 kg)  05/23/17 131 lb (59.4 kg)      ASSESSMENT AND PLAN:  1  CAD  No sympotms to sugg angina   2  CHF  Chronic systolic CHF  Volume is not bad  I have asked her to weigh herself daily  Call if wts stay up overal several dz  Keep on same regimen  Tolerating higher dose of hydralazine.    3  Hx NSVT  Off of mexilitene  4  HL  Continue statin   Family says that she is not using O2 at home  Seen in pulmonary Told she does not need  Will stop  F?U in Feb 2019  Will plan f/u later this fall to reeval BP and discuss meds     Current medicines are reviewed at length with the patient today.  The patient does not have concerns regarding medicines.  Signed, Dorris Carnes, MD  09/22/2017 12:39 PM    [facility] [address], [place], [place]  [address] Phone: [phone]; Fax: [phone]

## 2017-09-22 NOTE — Patient Instructions (Signed)
Stop oxygen. Your physician recommends that you schedule a follow-up appointment in: February, 2019.

## 2017-10-29 ENCOUNTER — Other Ambulatory Visit: Payer: Self-pay | Admitting: Family Medicine

## 2017-10-29 DIAGNOSIS — E7800 Pure hypercholesterolemia, unspecified: Secondary | ICD-10-CM

## 2017-10-29 DIAGNOSIS — I10 Essential (primary) hypertension: Secondary | ICD-10-CM

## 2017-11-10 ENCOUNTER — Other Ambulatory Visit: Payer: Self-pay | Admitting: Family Medicine

## 2017-12-10 ENCOUNTER — Other Ambulatory Visit: Payer: Self-pay | Admitting: Family Medicine

## 2017-12-10 DIAGNOSIS — J410 Simple chronic bronchitis: Secondary | ICD-10-CM

## 2018-01-02 ENCOUNTER — Encounter: Payer: Self-pay | Admitting: Internal Medicine

## 2018-01-02 ENCOUNTER — Ambulatory Visit: Payer: Medicare Other | Admitting: Internal Medicine

## 2018-01-02 VITALS — BP 154/88 | HR 76 | Ht 61.0 in | Wt 139.0 lb

## 2018-01-02 DIAGNOSIS — E781 Pure hyperglyceridemia: Secondary | ICD-10-CM | POA: Diagnosis not present

## 2018-01-02 DIAGNOSIS — I2510 Atherosclerotic heart disease of native coronary artery without angina pectoris: Secondary | ICD-10-CM | POA: Diagnosis not present

## 2018-01-02 DIAGNOSIS — I428 Other cardiomyopathies: Secondary | ICD-10-CM | POA: Diagnosis not present

## 2018-01-02 DIAGNOSIS — I10 Essential (primary) hypertension: Secondary | ICD-10-CM | POA: Diagnosis not present

## 2018-01-02 MED ORDER — HYDRALAZINE HCL 100 MG PO TABS: 100.0000 mg | ORAL_TABLET | Freq: Three times a day (TID) | ORAL | 11 refills | Status: DC

## 2018-01-02 MED ORDER — METOPROLOL SUCCINATE ER 50 MG PO TB24: 50.0000 mg | ORAL_TABLET | Freq: Every day | ORAL | 3 refills | Status: DC

## 2018-01-02 NOTE — Patient Instructions (Signed)
Medication Instructions:  Your physician has recommended you make the following change in your medication:  1.) increase hydralazine to 100 mg three times daily 2.) increase Toprol XL to 50 mg daily  Labwork: Your physician recommends that you return for lab work today (BMET, CBC, LIPIDS)  Testing/Procedures: NONE  Follow-Up: Your physician recommends that you schedule a follow-up appointment in: [address] [place] [address] ROSS   Any Other Special Instructions Will Be Listed Below (If Applicable).     If you need a refill on your cardiac medications before your next appointment, please call your pharmacy.

## 2018-01-02 NOTE — Progress Notes (Signed)
Cardiology Office Note   Date:  01/02/2018   ID:  JOANMARIE Anderson, DOB [birth year], MRN [id number]  PCP:  Dettinger, Fransisca Kaufmann, MD  Cardiologist:   Dorris Carnes, MD   Pt presents for f/u of CAD and chronic systolic CHF     History of Present Illness Meghan Anderson is a 82 y.o. female with a history of CAD (mild to mod dz except for small D2 at 95%), Chronic systlolc CHF LVEF 30 to 35% (Was 5 to 55% in 2016), COPD., NSVT   I saw her when she was hospitalized earlier this winter.  She diuresed with IV lasix.  She has been seen once bo B Bhagat in Jan 2018  I saw the pt in in September 2018  I recomm that she increase hydralazine for better BP control  Since then she says she has been doing OK  Breathing is OK  No PND  Has had some sinus drainage with cough  No fever   She denies CP  No signif edema         Current Meds  Medication Sig  . albuterol (PROVENTIL) (2.5 MG/3ML) 0.083% nebulizer solution NEBULIZE 1 VIAL EVERY 4 HOURS AS NEEDED  . atorvastatin (LIPITOR) 80 MG tablet TAKE ONE (1) TABLET EACH DAY  . fluticasone furoate-vilanterol (BREO ELLIPTA) 100-25 MCG/INH AEPB Inhale 1 puff into the lungs daily.  . furosemide (LASIX) 20 MG tablet Take 2 tablets (40 mg total) by mouth daily.  . hydrALAZINE (APRESOLINE) 50 MG tablet Take 1.5 tablets (75 mg total) by mouth 3 (three) times daily.  . isosorbide mononitrate (IMDUR) 60 MG 24 hr tablet TAKE ONE (1) TABLET EACH DAY  . levothyroxine (SYNTHROID, LEVOTHROID) 125 MCG tablet TAKE ONE TABLET EACH MORNING BEFORE BREAKFAST  . MAGNESIUM OXIDE PO Take by mouth 2 (two) times daily.  . metoprolol succinate (TOPROL XL) 25 MG 24 hr tablet Take 1 tablet (25 mg total) by mouth daily.  Marland Kitchen omeprazole (PRILOSEC) 40 MG capsule TAKE ONE (1) CAPSULE EACH DAY  . potassium chloride SA (K-DUR,KLOR-CON) 20 MEQ tablet TAKE ONE (1) TABLET EACH DAY  . SPIRIVA HANDIHALER 18 MCG inhalation capsule USE 1 PUFF DAILY  . traMADol (ULTRAM) 50 MG tablet Take 1  tablet (50 mg total) by mouth every 8 (eight) hours as needed.     Allergies:   Levofloxacin; Moxifloxacin; Prednisone; and Quinolones   Past Medical History:  Diagnosis Date  . Arthritis   . Cataract   . Chronic bronchitis ([place])   . Chronic renal disease, stage 3, moderately decreased glomerular filtration rate (GFR) between 30-59 mL/min/1.73 square meter (HCC) 02/21/2016  . Complication of anesthesia   . COPD (chronic obstructive pulmonary disease) ([place])   . GERD (gastroesophageal reflux disease)   . Hyperlipidemia   . Hypertension   . Hypothyroidism   . Murmur, heart   . PONV (postoperative nausea and vomiting)   . Rectal adenocarcinoma ([employer])   . Thyroid disease     Past Surgical History:  Procedure Laterality Date  . ABDOMINAL HYSTERECTOMY     complete  . CARDIAC CATHETERIZATION N/A 11/18/2016   Procedure: Left Heart Cath and Coronary Angiography;  Surgeon: [name], MD;  Location: [facility];  Service: Cardiovascular;  Laterality: N/A;  . CARDIAC CATHETERIZATION N/A 11/18/2016   Procedure: Right Heart Cath;  Surgeon: [name], MD;  Location: [facility];  Service: Cardiovascular;  Laterality: N/A;  . COLONOSCOPY N/A 11/01/2015   RMR:  Large fungating rectal mass precluded colonoscopy. Status post biopsy.   . COLONOSCOPY N/A 01/15/2016   Procedure: COLONOSCOPY;  Surgeon: [name], MD;  [facility];  Service: Endoscopy;  Laterality: N/A;  130   . FLEXIBLE SIGMOIDOSCOPY N/A 05/17/2016   Procedure: FLEXIBLE SIGMOIDOSCOPY;  Surgeon: [name], MD;  [facility];  Service: Endoscopy;  Laterality: N/A;  . Left ear    . TRANSANAL ENDOSCOPIC MICROSURGERY N/A 12/14/2015   Procedure: TRANSANAL ENDOSCOPIC MICROSURGERY OF RECTAL POLYP;  Surgeon: [name], MD;  [facility];  Service: General;  Laterality: N/A;     Social History:  The patient  reports that she quit smoking about 7 years ago. she has never used  smokeless tobacco. She reports that she does not drink alcohol or use drugs.   Family History:  The patient's family history includes Asthma in her brother; COPD in her brother; Cancer (age of onset: 55) in her brother; Emphysema in her brother; Kidney disease in her mother.    ROS:  Please see the history of present illness. All other systems are reviewed and  Negative to the above problem except as noted.    PHYSICAL EXAM: VS:  BP (!) 154/88   Pulse 76   Ht 5\' 1"  (1.549 m)   Wt 139 lb (63 kg)   BMI 26.26 kg/m   GEN: Well nourished, well developed, in no acute distress  HEENT: normal  Neck: JVP is normal  No , carotid bruits  Cardiac: RRR;  No murmurs, rubs, or gallops,Trivial  edema  Respiratory:  clear to auscultation bilaterally, normal work of breathing GI: soft, nontender, nondistended, + BS  No hepatomegaly  MS: no deformity Moving all extremities   Skin: warm and dry, no rash Neuro:  Strength and sensation are intact   EKG:  EKG is not  ordered today.   Lipid Panel    Component Value Date/Time   CHOL 124 02/13/2017 1408   TRIG 145 02/13/2017 1408   TRIG 230 (H) 12/29/2014 1155   HDL 41 02/13/2017 1408   HDL 49 12/29/2014 1155   CHOLHDL 3.0 02/13/2017 1408   CHOLHDL 4.3 07/05/2016 1051   VLDL 31 07/05/2016 1051   LDLCALC 54 02/13/2017 1408   LDLCALC 63 08/27/2013 1209      Wt Readings from Last 3 Encounters:  01/02/18 139 lb (63 kg)  09/22/17 133 lb 12.8 oz (60.7 kg)  07/28/17 128 lb 12.8 oz (58.4 kg)      ASSESSMENT AND PLAN:  1  CAD  No symptoms to sugg angina   2  CHF  Chronic systolic CHF  Volume is not bad Plan to increase hydralazine dose 3  HTN  BP is high  I would increase toprol XL to 50 mg and hydralazine to 100 tid   3  Hx NSVT  Off of mexilitene  4  HL  Continue statin  Check lpids  Will check CBC and BMET along with other labs   F/U in clnic in 2 months     Family says that she is not using O2 at home  Seen in pulmonary Told she  does not need  Will stop  F?U in Feb 2019  Will plan f/u later this fall to reeval BP and discuss meds     Current medicines are reviewed at length with the patient today.  The patient does not have concerns regarding medicines.  Signed, Dorris Carnes, MD  01/02/2018 12:00 PM  [facility], [place], [place]  [address] Phone: [phone]; Fax: [phone]

## 2018-01-03 LAB — CBC
HEMOGLOBIN: 9.7 g/dL — AB (ref 11.1–15.9)
Hematocrit: 29.4 % — ABNORMAL LOW (ref 34.0–46.6)
MCH: 26.9 pg (ref 26.6–33.0)
MCHC: 33 g/dL (ref 31.5–35.7)
MCV: 82 fL (ref 79–97)
Platelets: 406 10*3/uL — ABNORMAL HIGH (ref 150–379)
RBC: 3.6 x10E6/uL — ABNORMAL LOW (ref 3.77–5.28)
RDW: 15.3 % (ref 12.3–15.4)
WBC: 9 10*3/uL (ref 3.4–10.8)

## 2018-01-03 LAB — BASIC METABOLIC PANEL
BUN/Creatinine Ratio: 17 (ref 12–28)
BUN: 35 mg/dL — ABNORMAL HIGH (ref 8–27)
CO2: 24 mmol/L (ref 20–29)
CREATININE: 2.05 mg/dL — AB (ref 0.57–1.00)
Calcium: 9.3 mg/dL (ref 8.7–10.3)
Chloride: 102 mmol/L (ref 96–106)
GFR calc Af Amer: 26 mL/min/{1.73_m2} — ABNORMAL LOW (ref >59)
GFR calc non Af Amer: 23 mL/min/{1.73_m2} — ABNORMAL LOW (ref >59)
Glucose: 102 mg/dL — ABNORMAL HIGH (ref 65–99)
Potassium: 4.8 mmol/L (ref 3.5–5.2)
SODIUM: 143 mmol/L (ref 134–144)

## 2018-01-03 LAB — LIPID PANEL
CHOL/HDL RATIO: 4.9 ratio — AB (ref 0.0–4.4)
Cholesterol, Total: 187 mg/dL (ref 100–199)
HDL: 38 mg/dL — ABNORMAL LOW (ref >39)
LDL CALC: 104 mg/dL — AB (ref 0–99)
Triglycerides: 223 mg/dL — ABNORMAL HIGH (ref 0–149)
VLDL Cholesterol Cal: 45 mg/dL — ABNORMAL HIGH (ref 5–40)

## 2018-01-26 ENCOUNTER — Other Ambulatory Visit: Payer: Self-pay | Admitting: Family Medicine

## 2018-01-26 DIAGNOSIS — I10 Essential (primary) hypertension: Secondary | ICD-10-CM

## 2018-02-09 ENCOUNTER — Other Ambulatory Visit: Payer: Self-pay | Admitting: Family Medicine

## 2018-02-09 DIAGNOSIS — E7800 Pure hypercholesterolemia, unspecified: Secondary | ICD-10-CM

## 2018-02-09 NOTE — Telephone Encounter (Signed)
Will refill this but patient likely needs to come in for an appointment for a TSH

## 2018-02-16 ENCOUNTER — Other Ambulatory Visit: Payer: Self-pay

## 2018-02-23 IMAGING — CT CT PELVIS W/ CM
2 of 4 series · 16 of 46 positions shown, 18 images · IV contrast (omnipaque)
Comparison: 11/07/2015 CT abdomen/pelvis.

CLINICAL DATA: 79-year-old female status post minimally invasive
trans anal excision of rectal adenocarcinoma on 12/14/2015 with
positive margins, presenting for treatment planning.

EXAM:
CT PELVIS WITH CONTRAST
TECHNIQUE: Multidetector CT imaging of the pelvis was performed using the
standard protocol following the bolus administration of intravenous
contrast.
CONTRAST:  80mL OMNIPAQUE IOHEXOL 300 MG/ML  SOLN

[Series 2: pelvis 5.0 b40f · axial · 0.69mm/px · z∈[-604,-408]mm · 13 of 46 slices shown, 15 images]
[im 4/46  soft-tissue]
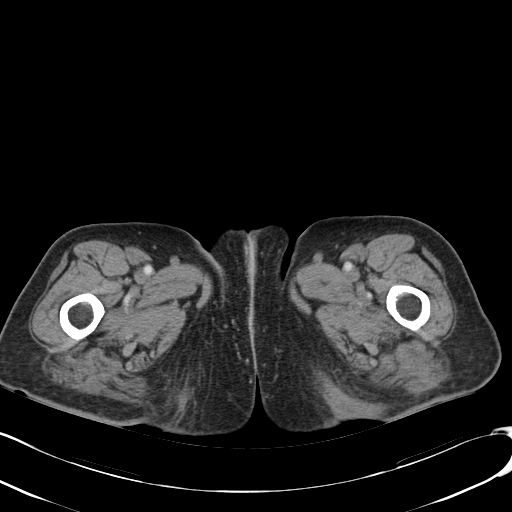
[im 4/46  bone]
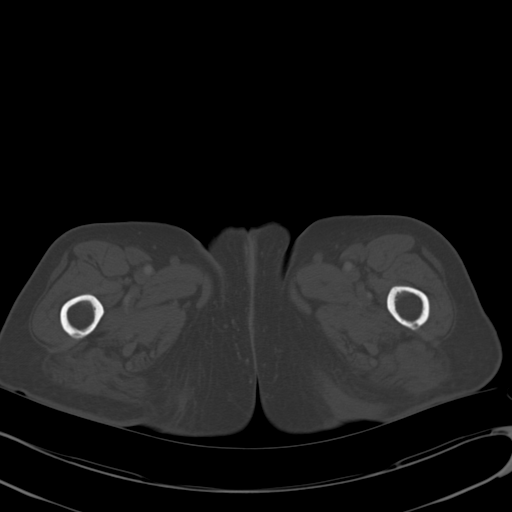
[im 7/46  soft-tissue]
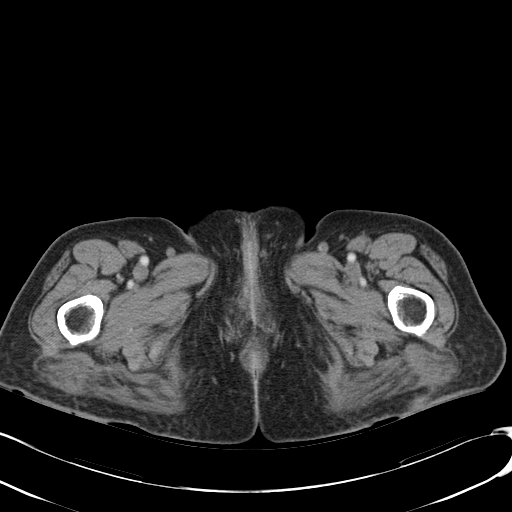
[im 10/46  soft-tissue]
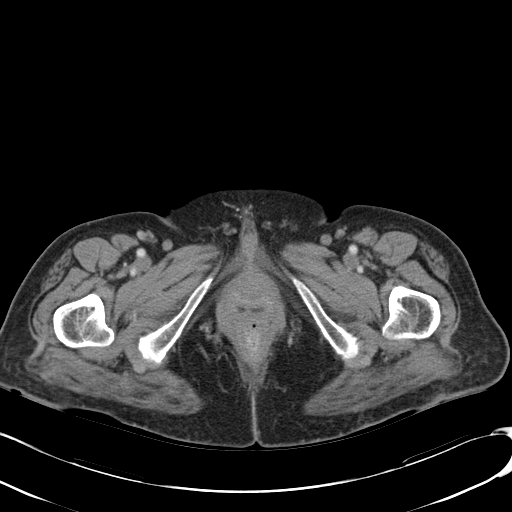
[im 13/46  soft-tissue]
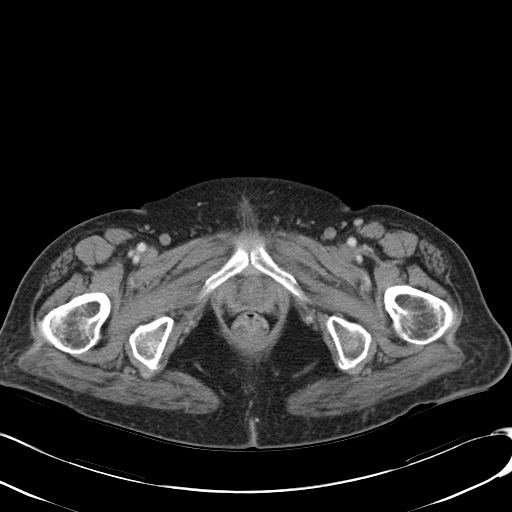
[im 16/46  soft-tissue]
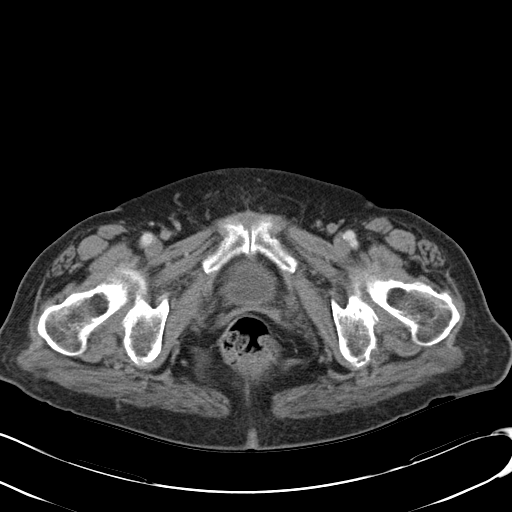
[im 19/46  soft-tissue]
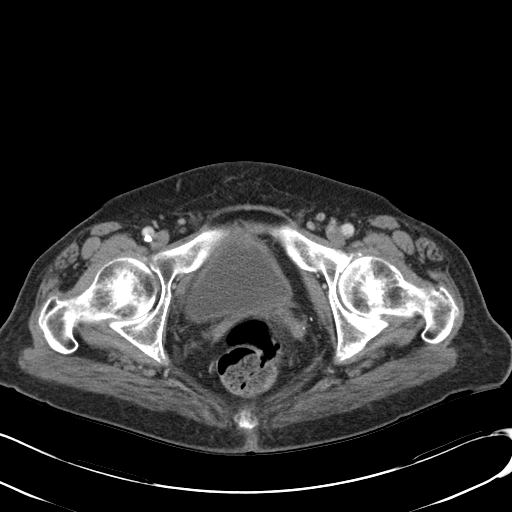
[im 25/46  soft-tissue]
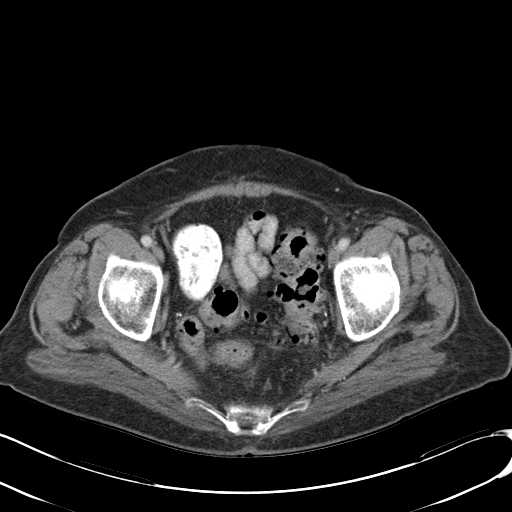
[im 28/46  soft-tissue]
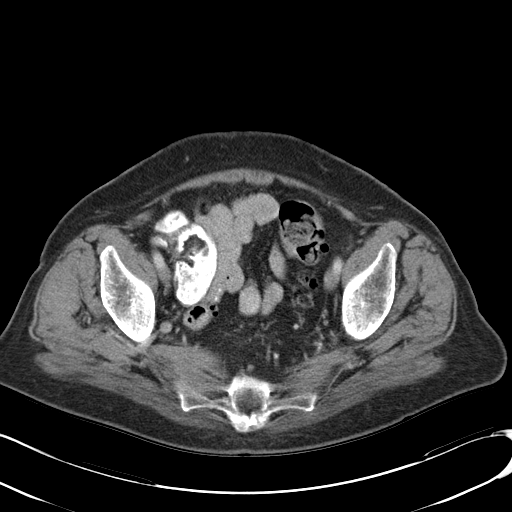
[im 31/46  soft-tissue]
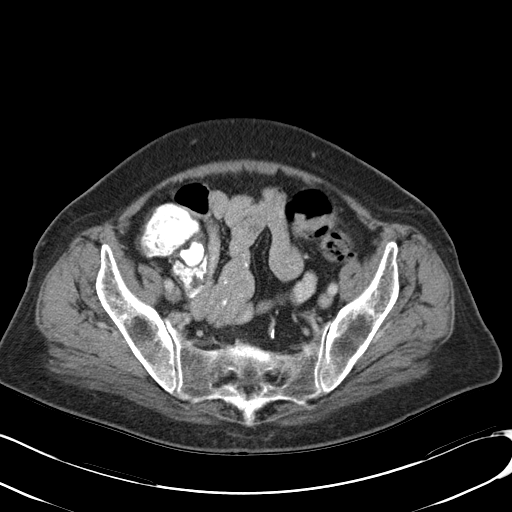
[im 31/46  bone]
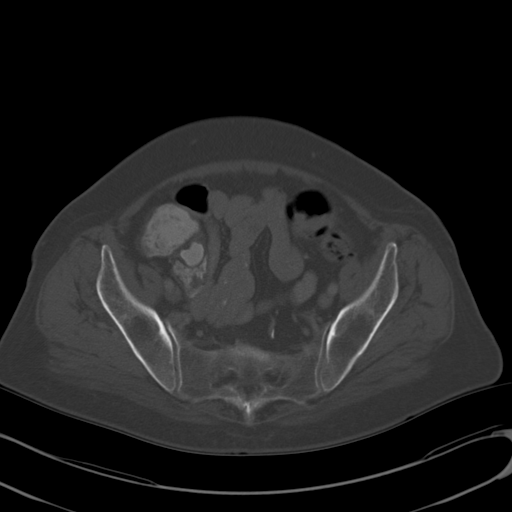
[im 34/46  soft-tissue]
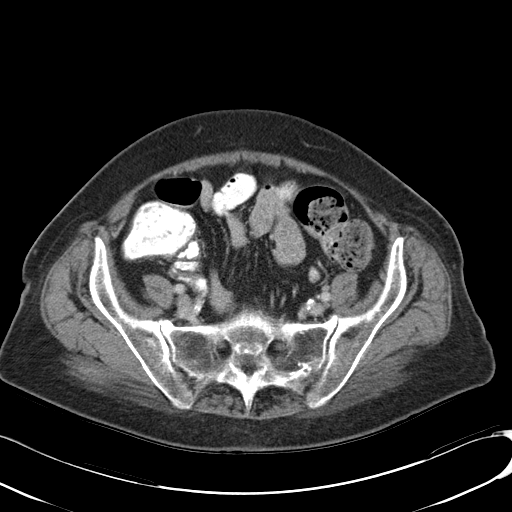
[im 37/46  soft-tissue]
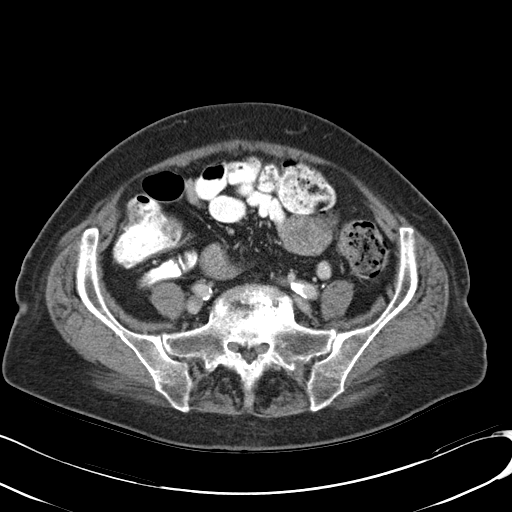
[im 40/46  soft-tissue]
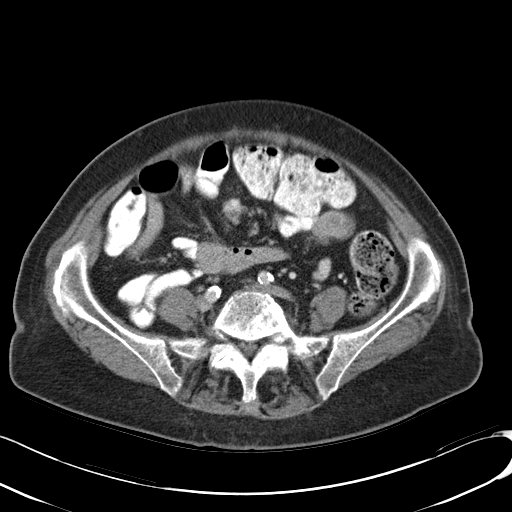
[im 43/46  soft-tissue]
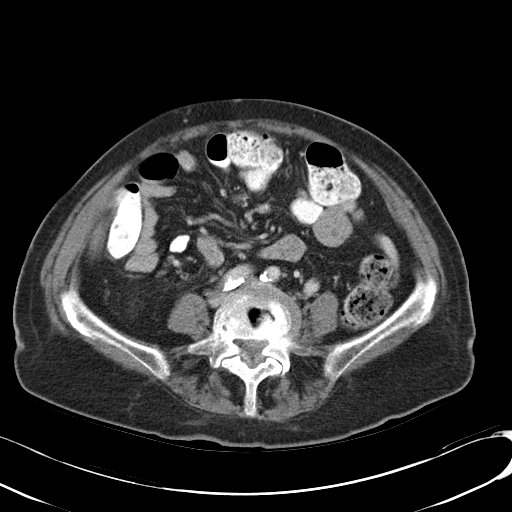

[Series 4: mpr coronal a/p cor · coronal · 0.47mm/px · 3 of 81 slices shown]
[im 27/81  soft-tissue]
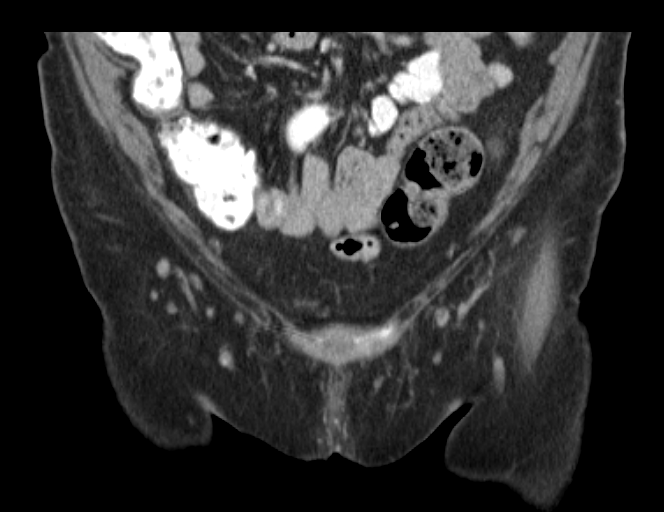
[im 36/81  soft-tissue]
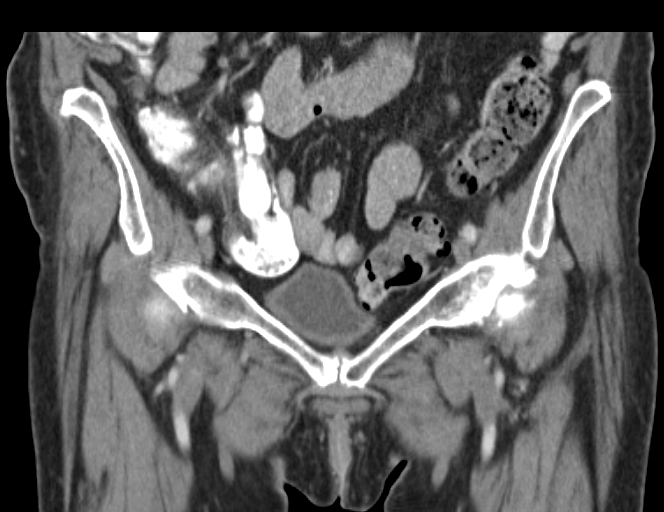
[im 45/81  soft-tissue]
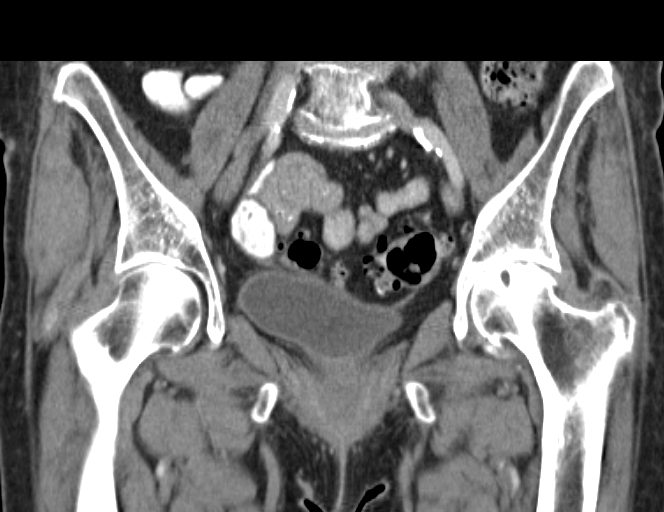

[16 of 46 positions shown; findings below may reference images not displayed]

FINDINGS: Reproductive: Status post hysterectomy, with no abnormal findings at
the vaginal cuff. No adnexal mass.

Bladder: Normal. The urethra appears grossly normal.

Bowel: There is no residual rectal intussusception. There is mild
residual wall thickening in the mid to upper rectum (approximately
8-9 cm superior to the anal verge) with associated mild perirectal
fat stranding posteriorly (series 4/image 64 and series 5/ image
54), without discrete rectal or perirectal mass or fluid collection.
There is moderate sigmoid diverticulosis, with no sigmoid wall
thickening or perisigmoid fat stranding to suggest acute
diverticulitis. Normal appendix. Visualized small bowel and proximal
colon are normal caliber without wall thickening.

Vascular/Lymphatic: No pathologically enlarged lymph nodes in the
pelvis. Tiny presacral lymph nodes, largest 0.4 cm, within normal
size limits. Atherosclerotic visualized aortic bifurcation and iliac
vessels with no aneurysm on this study.

Other: No pneumoperitoneum, ascites or focal fluid collection.

Musculoskeletal: No aggressive appearing focal osseous lesions.
Marked degenerative changes in the visualized lower lumbar spine.
IMPRESSION: 1. Nonspecific mild residual rectal wall thickening with associated
mild perirectal fat stranding posteriorly in the mid to upper rectum
(8-9 cm superior to the anal verge), which could represent
postsurgical changes and/or residual tumor.
2. No pelvic lymphadenopathy. No evidence of metastatic disease in
the pelvis.
3. Moderate sigmoid diverticulosis.

## 2018-04-10 ENCOUNTER — Encounter: Payer: Self-pay | Admitting: Internal Medicine

## 2018-04-10 ENCOUNTER — Encounter (INDEPENDENT_AMBULATORY_CARE_PROVIDER_SITE_OTHER): Payer: Self-pay

## 2018-04-10 ENCOUNTER — Ambulatory Visit: Payer: Medicare Other | Admitting: Internal Medicine

## 2018-04-10 VITALS — BP 144/72 | HR 84 | Ht 61.0 in | Wt 140.8 lb

## 2018-04-10 DIAGNOSIS — I10 Essential (primary) hypertension: Secondary | ICD-10-CM

## 2018-04-10 DIAGNOSIS — E782 Mixed hyperlipidemia: Secondary | ICD-10-CM | POA: Diagnosis not present

## 2018-04-10 DIAGNOSIS — I2510 Atherosclerotic heart disease of native coronary artery without angina pectoris: Secondary | ICD-10-CM

## 2018-04-10 DIAGNOSIS — I5022 Chronic systolic (congestive) heart failure: Secondary | ICD-10-CM | POA: Diagnosis not present

## 2018-04-10 NOTE — Progress Notes (Signed)
Cardiology Office Note   Date:  04/10/2018   ID:  Meghan Anderson, DOB [birth year], MRN [id number]  PCP:  Dettinger, Fransisca Kaufmann, MD  Cardiologist:   Dorris Carnes, MD   Pt presents for f/u of CAD and chronic systolic CHF     History of Present Illness Meghan Anderson is a 82 y.o. female with a history of CAD (mild to mod dz except for small D2 at 95%), Chronic systlolc CHF LVEF 30 to 35% (Was 19 to 55% in 2016), COPD., NSVT   I saw the pt in Feb 2019  I recomm increasing toprol and hydralazine at that time     Since seen she denies dizziness  No CP  Breathing is OK       Current Meds  Medication Sig  . albuterol (PROVENTIL) (2.5 MG/3ML) 0.083% nebulizer solution NEBULIZE 1 VIAL EVERY 4 HOURS AS NEEDED  . atorvastatin (LIPITOR) 80 MG tablet TAKE ONE (1) TABLET EACH DAY  . fluticasone furoate-vilanterol (BREO ELLIPTA) 100-25 MCG/INH AEPB Inhale 1 puff into the lungs daily.  . furosemide (LASIX) 20 MG tablet Take 2 tablets (40 mg total) by mouth daily.  . hydrALAZINE (APRESOLINE) 100 MG tablet Take 1 tablet (100 mg total) by mouth 3 (three) times daily.  . isosorbide mononitrate (IMDUR) 60 MG 24 hr tablet TAKE ONE (1) TABLET EACH DAY  . levothyroxine (SYNTHROID, LEVOTHROID) 125 MCG tablet TAKE ONE TABLET EACH MORNING BEFORE BREAKFAST  . MAGNESIUM OXIDE PO Take by mouth 2 (two) times daily.  . metoprolol succinate (TOPROL-XL) 50 MG 24 hr tablet Take 1 tablet (50 mg total) by mouth daily. Take with or immediately following a meal.  . omeprazole (PRILOSEC) 40 MG capsule TAKE ONE (1) CAPSULE EACH DAY  . potassium chloride SA (K-DUR,KLOR-CON) 20 MEQ tablet TAKE ONE (1) TABLET EACH DAY  . SPIRIVA HANDIHALER 18 MCG inhalation capsule USE 1 PUFF DAILY     Allergies:   Prednisone; Moxifloxacin hcl in nacl; Levofloxacin; Moxifloxacin; and Quinolones   Past Medical History:  Diagnosis Date  . Arthritis   . Cataract   . Chronic bronchitis ([place])   . Chronic renal disease, stage 3, moderately  decreased glomerular filtration rate (GFR) between 30-59 mL/min/1.73 square meter (HCC) 02/21/2016  . Complication of anesthesia   . COPD (chronic obstructive pulmonary disease) ([place])   . GERD (gastroesophageal reflux disease)   . Hyperlipidemia   . Hypertension   . Hypothyroidism   . Murmur, heart   . PONV (postoperative nausea and vomiting)   . Rectal adenocarcinoma ([place])   . Thyroid disease     Past Surgical History:  Procedure Laterality Date  . ABDOMINAL HYSTERECTOMY     complete  . CARDIAC CATHETERIZATION N/A 11/18/2016   Procedure: Left Heart Cath and Coronary Angiography;  Surgeon: [name], MD;  Location: [facility];  Service: Cardiovascular;  Laterality: N/A;  . CARDIAC CATHETERIZATION N/A 11/18/2016   Procedure: Right Heart Cath;  Surgeon: [name], MD;  Location: [facility];  Service: Cardiovascular;  Laterality: N/A;  . COLONOSCOPY N/A 11/01/2015   RMR: Large fungating rectal mass precluded colonoscopy. Status post biopsy.   . COLONOSCOPY N/A 01/15/2016   Procedure: COLONOSCOPY;  Surgeon: [name], MD;  [facility];  Service: Endoscopy;  Laterality: N/A;  130   . FLEXIBLE SIGMOIDOSCOPY N/A 05/17/2016   Procedure: FLEXIBLE SIGMOIDOSCOPY;  Surgeon: [name], MD;  [facility];  Service: Endoscopy;  Laterality: N/A;  .  Left ear    . TRANSANAL ENDOSCOPIC MICROSURGERY N/A 12/14/2015   Procedure: TRANSANAL ENDOSCOPIC MICROSURGERY OF RECTAL POLYP;  Surgeon: [name], MD;  [facility];  Service: General;  Laterality: N/A;     Social History:  The patient  reports that she quit smoking about 7 years ago. She has never used smokeless tobacco. She reports that she does not drink alcohol or use drugs.   Family History:  The patient's family history includes Asthma in her brother; COPD in her brother; Cancer (age of onset: 4) in her brother; Emphysema in her brother; Kidney disease in her mother.    ROS:   Please see the history of present illness. All other systems are reviewed and  Negative to the above problem except as noted.    PHYSICAL EXAM: VS:  BP (!) 144/72   Pulse 84   Ht 5\' 1"  (1.549 m)   Wt 63.9 kg (140 lb 12.8 oz)   SpO2 93%   BMI 26.60 kg/m   GEN: Overweight 82 yo in no acute distress   Examined in chair HEENT: normal  Neck: JVP is normal  No , carotid bruits  Cardiac: RRR;  No murmurs, rubs, or gallops,No   edema  Respiratory:  clear to auscultation bilaterally, normal work of breathing GI: soft, nontender, nondistended, + BS  No hepatomegaly  MS: no deformity Moving all extremities   Skin: warm and dry, no rash Neuro:  Strength and sensation are intact   EKG:  EKG is not  ordered today.   Lipid Panel    Component Value Date/Time   CHOL 187 01/02/2018 1247   TRIG 223 (H) 01/02/2018 1247   TRIG 230 (H) 12/29/2014 1155   HDL 38 (L) 01/02/2018 1247   HDL 49 12/29/2014 1155   CHOLHDL 4.9 (H) 01/02/2018 1247   CHOLHDL 4.3 07/05/2016 1051   VLDL 31 07/05/2016 1051   LDLCALC 104 (H) 01/02/2018 1247   LDLCALC 63 08/27/2013 1209      Wt Readings from Last 3 Encounters:  04/10/18 63.9 kg (140 lb 12.8 oz)  01/02/18 63 kg (139 lb)  09/22/17 60.7 kg (133 lb 12.8 oz)      ASSESSMENT AND PLAN: 1  HTN   BP is OK   Keep on same regimen    2  CAD  No symptoms to sugg angina   3  CHF  Chronic systolic CHF  Volume is OK    4  Hx NSVT  Off of mexilitene  5  HL  Continue statin   LIpids good     F/U in October 2019   Family says that she is not using O2 at home  Seen in pulmonary Told she does not need  Will stop  F?U in Feb 2019  Will plan f/u later this fall to reeval BP and discuss meds     Current medicines are reviewed at length with the patient today.  The patient does not have concerns regarding medicines.  Signed, Dorris Carnes, MD  04/10/2018 12:07 PM    [address], [place], [place]  [address] Phone: [phone]; Fax: [phone]

## 2018-04-10 NOTE — Patient Instructions (Signed)
Your physician recommends that you continue on your current medications as directed. Please refer to the Current Medication list given to you today. Your physician wants you to follow-up in: October, 2019 with Dr. Harrington Challenger. You will receive a reminder letter in the mail two months in advance. If you don't receive a letter, please call our office to schedule the follow-up appointment.

## 2018-04-29 ENCOUNTER — Other Ambulatory Visit: Payer: Self-pay | Admitting: Family Medicine

## 2018-04-29 DIAGNOSIS — I10 Essential (primary) hypertension: Secondary | ICD-10-CM

## 2018-05-12 ENCOUNTER — Other Ambulatory Visit: Payer: Self-pay | Admitting: Family Medicine

## 2018-06-11 ENCOUNTER — Other Ambulatory Visit: Payer: Self-pay | Admitting: Dermatology

## 2018-07-02 ENCOUNTER — Encounter: Payer: Self-pay | Admitting: *Deleted

## 2018-07-02 ENCOUNTER — Ambulatory Visit (INDEPENDENT_AMBULATORY_CARE_PROVIDER_SITE_OTHER): Payer: Medicare Other | Admitting: *Deleted

## 2018-07-02 VITALS — BP 159/81 | HR 93 | Ht 61.0 in | Wt 134.0 lb

## 2018-07-02 DIAGNOSIS — Z0000 Encounter for general adult medical examination without abnormal findings: Secondary | ICD-10-CM | POA: Diagnosis not present

## 2018-07-02 DIAGNOSIS — Z23 Encounter for immunization: Secondary | ICD-10-CM

## 2018-07-02 NOTE — Progress Notes (Signed)
Subjective:   Meghan Anderson is a 82 y.o. female who presents for a Medicare Annual Wellness Visit. Meghan Anderson lives at home alone. Her family visits often and brings her meals. She has 4 children, 9 grandchildren, and 11 great grandchildren. Her son lives across the street and her grandson lives beside of her.Her husband passed away 41 years ago and she did not remarry. [name] enjoys reading as a hobby.   Review of Systems    Patient reports that her overall health is unchanged compared to last year.  Cardiac Risk Factors include: advanced age (>75men, >60 women);dyslipidemia;hypertension;female gender;sedentary lifestyle;obesity (BMI >30kg/m2)  Muscuoloskeletal: Right wrist pain and nodule since falling on that arm 1 month ago. No obvious abnormality. Patient will follow up if it does resolved.   HEENT: noise in right ear "for a while." Hx of surgery to that ear.  Respiratory: some SOB after walking upstairs. Her main living area is upstairs but she tries to limit the amount of trips she makes up and down the steps. Uses nebulizer 2 times a day everyday. Sometimes more often. Uses mainetnance as well.  Sees pulmonary, Dr Michela Pitcher with [facility], on a regular basis.   Dr Michela Pitcher gave pneumonia   All other systems negative       Current Medications (verified) Outpatient Encounter Medications as of 07/02/2018  Medication Sig  . albuterol (PROVENTIL) (2.5 MG/3ML) 0.083% nebulizer solution NEBULIZE 1 VIAL EVERY 4 HOURS AS NEEDED  . fluticasone furoate-vilanterol (BREO ELLIPTA) 100-25 MCG/INH AEPB Inhale 1 puff into the lungs daily.  . furosemide (LASIX) 20 MG tablet Take 2 tablets (40 mg total) by mouth daily.  . hydrALAZINE (APRESOLINE) 100 MG tablet Take 1 tablet (100 mg total) by mouth 3 (three) times daily.  . isosorbide mononitrate (IMDUR) 60 MG 24 hr tablet TAKE ONE (1) TABLET EACH DAY  . MAGNESIUM OXIDE PO Take by mouth 2 (two) times daily.  . metoprolol succinate (TOPROL-XL) 50 MG  24 hr tablet Take 1 tablet (50 mg total) by mouth daily. Take with or immediately following a meal.  . SPIRIVA HANDIHALER 18 MCG inhalation capsule USE 1 PUFF DAILY  . [DISCONTINUED] atorvastatin (LIPITOR) 80 MG tablet TAKE ONE (1) TABLET EACH DAY  . [DISCONTINUED] levothyroxine (SYNTHROID, LEVOTHROID) 125 MCG tablet TAKE ONE TABLET EACH MORNING BEFORE BREAKFAST  . [DISCONTINUED] omeprazole (PRILOSEC) 40 MG capsule TAKE ONE (1) CAPSULE EACH DAY  . [DISCONTINUED] potassium chloride SA (K-DUR,KLOR-CON) 20 MEQ tablet TAKE ONE (1) TABLET EACH DAY   No facility-administered encounter medications on file as of 07/02/2018.     Allergies (verified) Prednisone; Moxifloxacin hcl in nacl; Levofloxacin; Moxifloxacin; and Quinolones   History: Past Medical History:  Diagnosis Date  . Arthritis   . Cataract   . Chronic bronchitis ([place])   . Chronic renal disease, stage 3, moderately decreased glomerular filtration rate (GFR) between 30-59 mL/min/1.73 square meter (HCC) 02/21/2016  . Complication of anesthesia   . COPD (chronic obstructive pulmonary disease) ([place])   . GERD (gastroesophageal reflux disease)   . Hyperlipidemia   . Hypertension   . Hypothyroidism   . Murmur, heart   . PONV (postoperative nausea and vomiting)   . Rectal adenocarcinoma ([place])   . Thyroid disease    Past Surgical History:  Procedure Laterality Date  . ABDOMINAL HYSTERECTOMY     complete  . CARDIAC CATHETERIZATION N/A 11/18/2016   Procedure: Left Heart Cath and Coronary Angiography;  Surgeon: [name], MD;  Location: [facility];  Service: Cardiovascular;  Laterality: N/A;  . CARDIAC CATHETERIZATION N/A 11/18/2016   Procedure: Right Heart Cath;  Surgeon: [name], MD;  Location: [facility];  Service: Cardiovascular;  Laterality: N/A;  . COLONOSCOPY N/A 11/01/2015   RMR: Large fungating rectal mass precluded colonoscopy. Status post biopsy.   . COLONOSCOPY N/A 01/15/2016   Procedure:  COLONOSCOPY;  Surgeon: [name], MD;  [facility];  Service: Endoscopy;  Laterality: N/A;  130   . FLEXIBLE SIGMOIDOSCOPY N/A 05/17/2016   Procedure: FLEXIBLE SIGMOIDOSCOPY;  Surgeon: [name], MD;  [facility];  Service: Endoscopy;  Laterality: N/A;  . Left ear    . TRANSANAL ENDOSCOPIC MICROSURGERY N/A 12/14/2015   Procedure: TRANSANAL ENDOSCOPIC MICROSURGERY OF RECTAL POLYP;  Surgeon: [name], MD;  [facility];  Service: General;  Laterality: N/A;   Family History  Problem Relation Age of Onset  . Kidney disease Mother        nephrectomy x 1  . Stroke Sister   . Heart disease Sister   . Diabetes Sister   . COPD Brother   . Emphysema Brother   . Asthma Brother   . Cancer Brother 81       brain  . Colon cancer Neg Hx    Social History   Socioeconomic History  . Marital status: Widowed    Spouse name: Not on file  . Number of children: 4  . Years of education: 60  . Highest education level: 12th grade  Occupational History  . 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 Financial resource strain: Not hard at all  . Food insecurity:    Worry: Never true    Inability: Never true  . Transportation needs:    Medical: No    Non-medical: No  Tobacco Use  . Smoking status: Former Smoker    Packs/day: 0.75    Years: 55.00    Pack years: 41.25    Types: Cigarettes    Last attempt to quit: 11/11/2010    Years since quitting: 7.6  . Smokeless tobacco: Never Used  Substance and Sexual Activity  . Alcohol use: No    Alcohol/week: 0.0 standard drinks  . Drug use: No  . Sexual activity: Not Currently  Lifestyle  . Physical activity:    Days per week: 7 days    Minutes per session: 30 min  . Stress: Not at all  Relationships  . Social connections:    Talks on phone: More than three times a week    Gets together: More than three  times a week    Attends religious service: Never    Active member of club or organization: No    Attends meetings of clubs or organizations: Never    Relationship status: Widowed  Other Topics Concern  . Not on file  Social History Narrative  . Not on file    Tobacco Use No.  Clinical Intake:     Pain : 0-10 Pain Type: Acute pain Pain Location: Wrist Pain Orientation: Left Pain Descriptors / Indicators: Aching Pain Onset: More than a month ago Pain Frequency: Constant     Nutritional Status: BMI 25 -29 Overweight Diabetes: No  How often do you need to have someone help you when you read instructions, pamphlets, or other written materials from your doctor or  pharmacy?: 1 - Never What is the last grade level you completed in school?: 12 the grade     Information entered by :: Chong Sicilian, RN   Activities of Daily Living In your present state of health, do you have any difficulty performing the following activities: 07/02/2018  Hearing? Y  Comment hearing loss right ear. has had surgery. no recent eval  Vision? N  Comment needs eye exam  Difficulty concentrating or making decisions? N  Walking or climbing stairs? Y  Dressing or bathing? N  Doing errands, shopping? N  Preparing Food and eating ? N  Using the Toilet? N  In the past six months, have you accidently leaked urine? N  Do you have problems with loss of bowel control? N  Managing your Medications? N  Comment son fixes them in a pill box   Managing your Finances? N  Housekeeping or managing your Housekeeping? N  Some recent data might be hidden    Diet 2 meals a day and a snack in the evening Drinks water and canned sundrop Family brings supper. Doesn't do a lot of cooking.   Exercise Current Exercise Habits: Home exercise routine(works around house), Type of exercise: walking, Time (Minutes): 20, Frequency (Times/Week): 7, Weekly Exercise (Minutes/Week): 140, Intensity: Mild, Exercise limited by:  orthopedic condition(s)   Depression Screen PHQ 2/9 Scores 07/08/2018 07/02/2018 05/23/2017 05/21/2017 04/08/2017 02/13/2017 07/04/2016  PHQ - 2 Score 0 0 0 0 0 0 0     Fall Risk Fall Risk  07/08/2018 07/02/2018 05/23/2017 05/21/2017 04/08/2017  Falls in the past year? No No No No No  Number falls in past yr: - - - - -  Injury with Fall? - - - - -    Safety Is the patient's home free of loose throw rugs in walkways, pet beds, electrical cords, etc?   yes      Grab bars in the bathroom? yes      Walkin shower? yes      Shower Seat? yes      Handrails on the stairs?   yes      Adequate lighting?   yes  Patient Care Team: Dettinger, Fransisca Kaufmann, MD as PCP - General [name], MD as Consulting Physician (Ophthalmology) [name] Cristopher Estimable, MD as Consulting Physician (Gastroenterology) Fay Records, MD as Consulting Physician (Cardiology) Dionne Milo, MD as Referring Physician (Pulmonary Disease)  Hospitalizations, surgeries, and ER visits in previous 12 months No hospitalizations, ER visits, or surgeries this past year.   Objective:    Today's Vitals   07/02/18 1514  BP: (!) 159/81  Pulse: 93  Weight: 134 lb (60.8 kg)  Height: 5\' 1"  (1.549 m)   Body mass index is 25.32 kg/m.  Advanced Directives 07/02/2018 11/12/2016 11/08/2016 11/08/2016 09/16/2016 08/13/2016 07/30/2016  Does Patient Have a Medical Advance Directive? Yes Yes Yes Yes Yes Yes Yes  Type of Paramedic of [place];Living will [place];Living will [place];Living will Healthcare Power of [place] of [place]  Does patient want to make changes to medical advance directive? No - Patient declined No - Patient declined - - No - Patient declined No - Patient declined No - Patient declined  Copy of [place] in Chart? Yes - No - copy requested No - copy requested No - copy  requested No - copy requested No - copy requested  Would patient like  information on creating a medical advance directive? - - No - Patient declined - No - patient declined information No - patient declined information No - patient declined information    Hearing/Vision  Some hearing deficit noted during visit. No vision deficit noted.  Cognitive Function: Patient unable to complete due to time constraints and difficulty hearing as well      Immunizations and Health Maintenance Immunization History  Administered Date(s) Administered  . Influenza-Unspecified 08/12/2015  . Pneumococcal Conjugate-13 12/29/2014  . Tdap 07/02/2018   Health Maintenance Due  Topic Date Due  . INFLUENZA VACCINE  06/11/2018   Health Maintenance  Topic Date Due  . INFLUENZA VACCINE  06/11/2018  . DEXA SCAN  07/03/2019 (Originally 02/24/2003)  . TETANUS/TDAP  07/02/2028  . PNA vac Low Risk Adult  Completed        Assessment:   This is a routine wellness examination for [place].    Plan:    Goals    . DIET - DECREASE SODA OR JUICE INTAKE    . DIET - INCREASE WATER INTAKE    . Exercise 150 min/wk Moderate Activity        Health Maintenance Recommendations: Bone densitometry screening-declined Flu shot-beginning at the end of Sept Tdap-administered and ABN signed Pneumovasx-give at next visit if unable to locate records   Additional Screening Recommendations: Lung: Low Dose CT Chest recommended if Age 67-80 years, 30 pack-year currently smoking OR have quit w/in 15years. Patient does qualify. Hepatitis C Screening recommended: no  Today's Orders Orders Placed This Encounter  Procedures  . Tdap vaccine greater than or equal to 7yo IM    Keep f/u with Dettinger, Fransisca Kaufmann, MD and any other specialty appointments you may have Continue current medications Move carefully to avoid falls. Use assistive devices like a cane or walker if needed. Aim for at least 150 minutes of moderate activity  a week. This can be done with chair exercises if necessary. Read or work on puzzles daily Stay connected with friends and family  I have personally reviewed and noted the following in the patient's chart:   . Medical and social history . Use of alcohol, tobacco or illicit drugs  . Current medications and supplements . Functional ability and status . Nutritional status . Physical activity . Advanced directives . List of other physicians . Hospitalizations, surgeries, and ER visits in previous 12 months . Vitals . Screenings to include cognitive, depression, and falls . Referrals and appointments  In addition, I have reviewed and discussed with patient certain preventive protocols, quality metrics, and best practice recommendations. A written personalized care plan for preventive services as well as general preventive health recommendations were provided to patient.     Chong Sicilian, RN   07/02/2018

## 2018-07-02 NOTE — Patient Instructions (Addendum)
  Meghan Anderson , Thank you for taking time to come for your Medicare Wellness Visit. I appreciate your ongoing commitment to your health goals. Please review the following plan we discussed and let me know if I can assist you in the future.   These are the goals we discussed: Goals    . DIET - DECREASE SODA OR JUICE INTAKE    . DIET - INCREASE WATER INTAKE    . Exercise 150 min/wk Moderate Activity       This is a list of the screening recommended for you and due dates:  Health Maintenance  Topic Date Due  . Tetanus Vaccine  02/[birth year]/1957  . DEXA scan (bone density measurement)  02/24/2003  . Flu Shot  06/11/2018  . Pneumonia vaccines  Completed

## 2018-07-08 ENCOUNTER — Encounter: Payer: Self-pay | Admitting: Family Medicine

## 2018-07-08 ENCOUNTER — Ambulatory Visit: Payer: Medicare Other | Admitting: Family Medicine

## 2018-07-08 VITALS — BP 158/79 | HR 70 | Temp 99.3°F | Ht 61.0 in | Wt 134.8 lb

## 2018-07-08 DIAGNOSIS — E039 Hypothyroidism, unspecified: Secondary | ICD-10-CM

## 2018-07-08 DIAGNOSIS — I10 Essential (primary) hypertension: Secondary | ICD-10-CM | POA: Diagnosis not present

## 2018-07-08 DIAGNOSIS — N183 Chronic kidney disease, stage 3 unspecified: Secondary | ICD-10-CM

## 2018-07-08 DIAGNOSIS — E7800 Pure hypercholesterolemia, unspecified: Secondary | ICD-10-CM

## 2018-07-08 DIAGNOSIS — R011 Cardiac murmur, unspecified: Secondary | ICD-10-CM

## 2018-07-08 DIAGNOSIS — H9192 Unspecified hearing loss, left ear: Secondary | ICD-10-CM

## 2018-07-08 MED ORDER — POTASSIUM CHLORIDE CRYS ER 20 MEQ PO TBCR: EXTENDED_RELEASE_TABLET | ORAL | 3 refills | Status: DC

## 2018-07-08 MED ORDER — OMEPRAZOLE 40 MG PO CPDR: DELAYED_RELEASE_CAPSULE | ORAL | 3 refills | Status: DC

## 2018-07-08 MED ORDER — LEVOTHYROXINE SODIUM 125 MCG PO TABS: ORAL_TABLET | ORAL | 3 refills | Status: DC

## 2018-07-08 MED ORDER — ATORVASTATIN CALCIUM 80 MG PO TABS: 80.0000 mg | ORAL_TABLET | Freq: Every day | ORAL | 3 refills | Status: DC

## 2018-07-08 NOTE — Progress Notes (Signed)
BP (!) 158/79   Pulse 70   Temp 99.3 F (37.4 C) (Oral)   Ht _0  (1.549 m)   Wt 134 lb 12.8 oz (61.1 kg)   SpO2 96%   BMI 25.47 kg/m    Subjective:    Patient ID: Meghan Anderson, female    DOB: 12-Dec-1935, 82 y.o.   MRN: [id number]  HPI: Meghan Anderson is a 82 y.o. female presenting on 07/08/2018 for Hypertension (yearly check up); Wrist Pain (right. Patient states it has been going on awhile); and Ear Pain (Left. Patient states it has been going on awhile)   HPI Hypertension Patient is currently on hydralazine and Imdur and metoprolol, and their blood pressure today is 158/79. Patient denies any lightheadedness or dizziness. Patient denies headaches, blurred vision, chest pains, shortness of breath, or weakness. Denies any side effects from medication and is content with current medication.   Hyperlipidemia Patient is coming in for recheck of his hyperlipidemia. The patient is currently taking atorvastatin. They deny any issues with myalgias or history of liver damage from it. They deny any focal numbness or weakness or chest pain.   Hypothyroidism recheck Patient is coming in for thyroid recheck today as well. They deny any issues with hair changes or heat or cold problems or diarrhea or constipation. They deny any chest pain or palpitations. They are currently on levothyroxine 155mcrograms   Chronic renal disease Patient has chronic renal disease and is coming in for recheck today.  Recent has a systolic murmur that she is known for some time.  Patient complains of hearing loss in left ear and wants to get her ears checked to see if it is wax or if she is actually having hearing loss.  She says is been gradually worsening over the past year.  Relevant past medical, surgical, family and social history reviewed and updated as indicated. Interim medical history since our last visit reviewed. Allergies and medications reviewed and updated.  Review of Systems    Constitutional: Negative for chills and fever.  Eyes: Negative for visual disturbance.  Respiratory: Negative for chest tightness and shortness of breath.   Cardiovascular: Negative for chest pain and leg swelling.  Musculoskeletal: Negative for back pain and gait problem.  Skin: Negative for rash.  Neurological: Negative for dizziness, weakness, light-headedness, numbness and headaches.  Psychiatric/Behavioral: Negative for agitation and behavioral problems.  All other systems reviewed and are negative.   Per HPI unless specifically indicated above   Allergies as of 07/08/2018      Reactions   Prednisone Itching, Rash   Moxifloxacin Hcl In Nacl Rash   Levofloxacin Other (See Comments)   Pt states that this medication was too strong and she could not take them.     Moxifloxacin Rash   Quinolones Rash      Medication List        Accurate as of 07/08/18  4:19 PM. Always use your most recent med list.          albuterol (2.5 MG/3ML) 0.083% nebulizer solution Commonly known as:  PROVENTIL NEBULIZE 1 VIAL EVERY 4 HOURS AS NEEDED   aspirin 81 MG chewable tablet Chew by mouth daily.   aspirin EC 81 MG tablet Take 81 mg by mouth daily.   atorvastatin 80 MG tablet Commonly known as:  LIPITOR Take 1 tablet (80 mg total) by mouth daily at 6 PM.   B-12 2500 MCG Tabs Take by mouth.   BREO ELLIPTA 100-25 MCG/INH  Aepb Generic drug:  fluticasone furoate-vilanterol Inhale 1 puff into the lungs daily.   furosemide 20 MG tablet Commonly known as:  LASIX Take 2 tablets (40 mg total) by mouth daily.   hydrALAZINE 100 MG tablet Commonly known as:  APRESOLINE Take 1 tablet (100 mg total) by mouth 3 (three) times daily.   isosorbide mononitrate 60 MG 24 hr tablet Commonly known as:  IMDUR TAKE ONE (1) TABLET EACH DAY   levothyroxine 125 MCG tablet Commonly known as:  SYNTHROID, LEVOTHROID TAKE ONE TABLET EACH MORNING BEFORE BREAKFAST   MAGNESIUM OXIDE PO Take by mouth 2  (two) times daily.   metoprolol succinate 50 MG 24 hr tablet Commonly known as:  TOPROL-XL Take 1 tablet (50 mg total) by mouth daily. Take with or immediately following a meal.   omeprazole 40 MG capsule Commonly known as:  PRILOSEC TAKE ONE (1) CAPSULE EACH DAY   potassium chloride SA 20 MEQ tablet Commonly known as:  K-DUR,KLOR-CON TAKE ONE (1) TABLET EACH DAY   SPIRIVA HANDIHALER 18 MCG inhalation capsule Generic drug:  tiotropium USE 1 PUFF DAILY          Objective:    BP (!) 158/79   Pulse 70   Temp 99.3 F (37.4 C) (Oral)   Ht _0  (1.549 m)   Wt 134 lb 12.8 oz (61.1 kg)   SpO2 96%   BMI 25.47 kg/m   Wt Readings from Last 3 Encounters:  07/08/18 134 lb 12.8 oz (61.1 kg)  07/02/18 134 lb (60.8 kg)  04/10/18 140 lb 12.8 oz (63.9 kg)    Physical Exam  Constitutional: She is oriented to person, place, and time. She appears well-developed and well-nourished. No distress.  Eyes: Conjunctivae are normal.  Neck: Neck supple. No thyromegaly present.  Cardiovascular: Normal rate, regular rhythm and intact distal pulses.  Murmur (3 out of 6 holosystolic murmur) heard. Pulmonary/Chest: Effort normal and breath sounds normal. No respiratory distress. She has no wheezes.  Lymphadenopathy:    She has no cervical adenopathy.  Neurological: She is alert and oriented to person, place, and time. Coordination normal.  Skin: Skin is warm and dry. No rash noted. She is not diaphoretic.  Psychiatric: She has a normal mood and affect. Her behavior is normal.  Nursing note and vitals reviewed.       Assessment & Plan:   Problem List Items Addressed This Visit      Cardiovascular and Mediastinum   Essential hypertension, benign   Relevant Medications   aspirin 81 MG chewable tablet   aspirin EC 81 MG tablet   potassium chloride SA (K-DUR,KLOR-CON) 20 MEQ tablet   atorvastatin (LIPITOR) 80 MG tablet   Other Relevant Orders   CMP14+EGFR (Completed)     Endocrine    Hypothyroidism   Relevant Medications   levothyroxine (SYNTHROID, LEVOTHROID) 112 MCG tablet   Other Relevant Orders   TSH (Completed)     Genitourinary   Chronic renal disease, stage 3, moderately decreased glomerular filtration rate (GFR) between 30-59 mL/min/1.73 square meter (HCC)   Relevant Orders   CBC with Differential/Platelet (Completed)     Other   Pure hypercholesterolemia - Primary   Relevant Medications   aspirin 81 MG chewable tablet   aspirin EC 81 MG tablet   atorvastatin (LIPITOR) 80 MG tablet   Other Relevant Orders   Lipid panel (Completed)    Other Visit Diagnoses    Systolic murmur       Hearing loss of  left ear, unspecified hearing loss type       Relevant Orders   Ambulatory referral to Audiology       Follow up plan: Return in about 6 months (around 01/08/2019), or if symptoms worsen or fail to improve, for Thyroid and blood pressure recheck.  Counseling provided for all of the vaccine components Orders Placed This Encounter  Procedures  . CBC with Differential/Platelet  . CMP14+EGFR  . Lipid panel  . TSH  . Ambulatory referral to Audiology    Caryl Pina, MD [facility] 07/08/2018, 4:19 PM

## 2018-07-09 LAB — CMP14+EGFR
A/G RATIO: 1.6 (ref 1.2–2.2)
ALK PHOS: 78 IU/L (ref 39–117)
ALT: 5 IU/L (ref 0–32)
AST: 11 IU/L (ref 0–40)
Albumin: 4.2 g/dL (ref 3.5–4.7)
BUN/Creatinine Ratio: 14 (ref 12–28)
BUN: 29 mg/dL — ABNORMAL HIGH (ref 8–27)
Bilirubin Total: <0.2 mg/dL (ref 0.0–1.2)
CALCIUM: 8.8 mg/dL (ref 8.7–10.3)
CHLORIDE: 105 mmol/L (ref 96–106)
CO2: 24 mmol/L (ref 20–29)
Creatinine, Ser: 2.03 mg/dL — ABNORMAL HIGH (ref 0.57–1.00)
GFR calc Af Amer: 26 mL/min/{1.73_m2} — ABNORMAL LOW (ref >59)
GFR, EST NON AFRICAN AMERICAN: 23 mL/min/{1.73_m2} — AB (ref >59)
GLOBULIN, TOTAL: 2.6 g/dL (ref 1.5–4.5)
Glucose: 90 mg/dL (ref 65–99)
POTASSIUM: 4.5 mmol/L (ref 3.5–5.2)
SODIUM: 146 mmol/L — AB (ref 134–144)
Total Protein: 6.8 g/dL (ref 6.0–8.5)

## 2018-07-09 LAB — CBC WITH DIFFERENTIAL/PLATELET
BASOS: 0 %
Basophils Absolute: 0 10*3/uL (ref 0.0–0.2)
EOS (ABSOLUTE): 0.2 10*3/uL (ref 0.0–0.4)
Eos: 2 %
Hematocrit: 29.2 % — ABNORMAL LOW (ref 34.0–46.6)
Hemoglobin: 9 g/dL — ABNORMAL LOW (ref 11.1–15.9)
IMMATURE GRANULOCYTES: 0 %
Immature Grans (Abs): 0 10*3/uL (ref 0.0–0.1)
LYMPHS ABS: 1 10*3/uL (ref 0.7–3.1)
Lymphs: 16 %
MCH: 25.7 pg — ABNORMAL LOW (ref 26.6–33.0)
MCHC: 30.8 g/dL — AB (ref 31.5–35.7)
MCV: 83 fL (ref 79–97)
MONOS ABS: 0 10*3/uL — AB (ref 0.1–0.9)
Monocytes: 0 %
Neutrophils Absolute: 5.4 10*3/uL (ref 1.4–7.0)
Neutrophils: 82 %
Platelets: 346 10*3/uL (ref 150–450)
RBC: 3.5 x10E6/uL — AB (ref 3.77–5.28)
RDW: 17.1 % — AB (ref 12.3–15.4)
WBC: 6.7 10*3/uL (ref 3.4–10.8)

## 2018-07-09 LAB — TSH: TSH: 0.145 u[IU]/mL — ABNORMAL LOW (ref 0.450–4.500)

## 2018-07-09 LAB — LIPID PANEL
CHOL/HDL RATIO: 4.2 ratio (ref 0.0–4.4)
CHOLESTEROL TOTAL: 168 mg/dL (ref 100–199)
HDL: 40 mg/dL (ref >39)
LDL Calculated: 92 mg/dL (ref 0–99)
TRIGLYCERIDES: 181 mg/dL — AB (ref 0–149)
VLDL Cholesterol Cal: 36 mg/dL (ref 5–40)

## 2018-07-10 MED ORDER — LEVOTHYROXINE SODIUM 112 MCG PO TABS: 112.0000 ug | ORAL_TABLET | Freq: Every day | ORAL | 3 refills | Status: DC

## 2018-08-14 ENCOUNTER — Ambulatory Visit: Payer: Medicare Other | Admitting: Internal Medicine

## 2018-08-14 ENCOUNTER — Encounter: Payer: Self-pay | Admitting: Internal Medicine

## 2018-08-14 VITALS — BP 154/60 | HR 91 | Ht 61.0 in | Wt 135.4 lb

## 2018-08-14 DIAGNOSIS — I10 Essential (primary) hypertension: Secondary | ICD-10-CM

## 2018-08-14 DIAGNOSIS — I2510 Atherosclerotic heart disease of native coronary artery without angina pectoris: Secondary | ICD-10-CM | POA: Diagnosis not present

## 2018-08-14 DIAGNOSIS — J441 Chronic obstructive pulmonary disease with (acute) exacerbation: Secondary | ICD-10-CM | POA: Diagnosis not present

## 2018-08-14 DIAGNOSIS — I5022 Chronic systolic (congestive) heart failure: Secondary | ICD-10-CM

## 2018-08-14 DIAGNOSIS — Z23 Encounter for immunization: Secondary | ICD-10-CM | POA: Diagnosis not present

## 2018-08-14 NOTE — Patient Instructions (Signed)
Medication Instructions:  Your physician recommends that you continue on your current medications as directed. Please refer to the Current Medication list given to you today.  If you need a refill on your cardiac medications before your next appointment, please call your pharmacy.   Lab work: none If you have labs (blood work) drawn today and your tests are completely normal, you will receive your results only by: Marland Kitchen MyChart Message (if you have MyChart) OR . A paper copy in the mail If you have any lab test that is abnormal or we need to change your treatment, we will call you to review the results.  Testing/Procedures: none  Follow-Up: At [facility], you and your health needs are our priority.  As part of our continuing mission to provide you with exceptional heart care, we have created designated Provider Care Teams.  These Care Teams include your primary Cardiologist (physician) and Advanced Practice Providers (APPs -  Physician Assistants and Nurse Practitioners) who all work together to provide you with the care you need, when you need it. You will need a follow up appointment in:  5 months.  Please call our office 2 months in advance to schedule this appointment.  You may see Dorris Carnes, MD or one of the following Advanced Practice Providers on your designated Care Team: Richardson Dopp, PA-C [place], [place] . Daune Perch, NP  Any Other Special Instructions Will Be Listed Below (If Applicable). none

## 2018-08-14 NOTE — Progress Notes (Addendum)
Cardiology Office Note   Date:  08/14/2018   ID:  Meghan Anderson, DOB 03-May-1936, MRN [id number]  PCP:  Dettinger, Fransisca Kaufmann, MD  Cardiologist:   Dorris Carnes, MD   Pt presents for f/u of CAD and chronic systolic CHF     History of Present Illness Meghan Anderson is a 82 y.o. female with a history of CAD (mild to mod dz except for small D2 at 95%), Chronic systlolc CHF LVEF 30 to 35% (Was 37 to 55% in 2016), COPD., NSVT   I saw the pt in May 2019  Since seen she denies dizziness   No CP   Breathing is fair     Current Meds  Medication Sig  . albuterol (PROVENTIL) (2.5 MG/3ML) 0.083% nebulizer solution NEBULIZE 1 VIAL EVERY 4 HOURS AS NEEDED  . aspirin 81 MG chewable tablet Chew by mouth daily.  Marland Kitchen aspirin EC 81 MG tablet Take 81 mg by mouth daily.  Marland Kitchen atorvastatin (LIPITOR) 80 MG tablet Take 1 tablet (80 mg total) by mouth daily at 6 PM.  . Cyanocobalamin (B-12) 2500 MCG TABS Take by mouth.  . fluticasone furoate-vilanterol (BREO ELLIPTA) 100-25 MCG/INH AEPB Inhale 1 puff into the lungs daily.  . furosemide (LASIX) 20 MG tablet Take 2 tablets (40 mg total) by mouth daily.  . hydrALAZINE (APRESOLINE) 100 MG tablet Take 1 tablet (100 mg total) by mouth 3 (three) times daily.  . isosorbide mononitrate (IMDUR) 60 MG 24 hr tablet TAKE ONE (1) TABLET EACH DAY  . levothyroxine (SYNTHROID, LEVOTHROID) 112 MCG tablet Take 1 tablet (112 mcg total) by mouth daily.  Marland Kitchen MAGNESIUM OXIDE PO Take by mouth 2 (two) times daily.  . metoprolol succinate (TOPROL-XL) 50 MG 24 hr tablet Take 1 tablet (50 mg total) by mouth daily. Take with or immediately following a meal.  . omeprazole (PRILOSEC) 40 MG capsule TAKE ONE (1) CAPSULE EACH DAY  . potassium chloride SA (K-DUR,KLOR-CON) 20 MEQ tablet TAKE ONE (1) TABLET EACH DAY  . SPIRIVA HANDIHALER 18 MCG inhalation capsule USE 1 PUFF DAILY     Allergies:   Prednisone; Moxifloxacin hcl in nacl; Levofloxacin; Moxifloxacin; and Quinolones   Past Medical  History:  Diagnosis Date  . Arthritis   . Cataract   . Chronic bronchitis ([place])   . Chronic renal disease, stage 3, moderately decreased glomerular filtration rate (GFR) between 30-59 mL/min/1.73 square meter (HCC) 02/21/2016  . Complication of anesthesia   . COPD (chronic obstructive pulmonary disease) ([place])   . GERD (gastroesophageal reflux disease)   . Hyperlipidemia   . Hypertension   . Hypothyroidism   . Murmur, heart   . PONV (postoperative nausea and vomiting)   . Rectal adenocarcinoma ([place])   . Thyroid disease     Past Surgical History:  Procedure Laterality Date  . ABDOMINAL HYSTERECTOMY     complete  . CARDIAC CATHETERIZATION N/A 11/18/2016   Procedure: Left Heart Cath and Coronary Angiography;  Surgeon: [name], MD;  Location: [place] [facility];  Service: Cardiovascular;  Laterality: N/A;  . CARDIAC CATHETERIZATION N/A 11/18/2016   Procedure: Right Heart Cath;  Surgeon: [name], MD;  Location: [place] [facility];  Service: Cardiovascular;  Laterality: N/A;  . COLONOSCOPY N/A 11/01/2015   RMR: Large fungating rectal mass precluded colonoscopy. Status post biopsy.   . COLONOSCOPY N/A 01/15/2016   Procedure: COLONOSCOPY;  Surgeon: [name], MD;  [facility];  Service: Endoscopy;  Laterality: N/A;  [place] N/A 05/17/2016   Procedure: FLEXIBLE SIGMOIDOSCOPY;  Surgeon: [name], MD;  [facility];  Service: Endoscopy;  Laterality: N/A;  . Left ear    . TRANSANAL ENDOSCOPIC MICROSURGERY N/A 12/14/2015   Procedure: TRANSANAL ENDOSCOPIC MICROSURGERY OF RECTAL POLYP;  Surgeon: [name], MD;  Location: [facility];  Service: General;  Laterality: N/A;     Social History:  The patient  reports that she quit smoking about 7 years ago. Her smoking use included cigarettes. She has a 41.25 pack-year smoking history. She has never used smokeless tobacco. She reports that she does not drink alcohol or use drugs.    Family History:  The patient's family history includes Asthma in her brother; COPD in her brother; Cancer (age of onset: 50) in her brother; Diabetes in her sister; Emphysema in her brother; Heart disease in her sister; Kidney disease in her mother; Stroke in her sister.    ROS:  Please see the history of present illness. All other systems are reviewed and  Negative to the above problem except as noted.    PHYSICAL EXAM: VS:  BP (!) 154/60   Pulse 91   Ht 5\' 1"  (1.549 m)   Wt 135 lb 6.4 oz (61.4 kg)   SpO2 93%   BMI 25.58 kg/m   GEN: Overweight 82 yo in no acute distress   Examined in chair HEENT: normal  Neck: JVP is normal  No , carotid bruits  Cardiac: RRR;  No murmurs, rubs, or gallops,No   edema  Respiratory: Moving air but wheezes bilaterally   Some rhonchi GI: soft, nontender, nondistended, + BS  No hepatomegaly  MS: no deformity Moving all extremities   Skin: warm and dry, no rash Neuro:  Strength and sensation are intact   EKG:  EKG is not  ordered today.   Lipid Panel    Component Value Date/Time   CHOL 168 07/08/2018 1628   TRIG 181 (H) 07/08/2018 1628   TRIG 230 (H) 12/29/2014 1155   HDL 40 07/08/2018 1628   HDL 49 12/29/2014 1155   CHOLHDL 4.2 07/08/2018 1628   CHOLHDL 4.3 07/05/2016 1051   VLDL 31 07/05/2016 1051   LDLCALC 92 07/08/2018 1628   LDLCALC 63 08/27/2013 1209      Wt Readings from Last 3 Encounters:  08/14/18 135 lb 6.4 oz (61.4 kg)  07/08/18 134 lb 12.8 oz (61.1 kg)  07/02/18 134 lb (60.8 kg)      ASSESSMENT AND PLAN: 1  HTN   BP is a little high   At home 130s to 150    I am reluctant to push lower given age    2  CAD  No symptoms of angina   Follow   3  CHF Volume appears OK   Renal funtion limits Rx     4  Hx NSVT No sympotmatic arrhythmia 5  Pulmonary   Would recomm she be seen in the next couple wks in pulmonary   She is followed in [place]    Given flu shot today   5  HL  Continue stat   Current medicines are reviewed at  length with the patient today.  The patient does not have concerns regarding medicines.  Signed, Dorris Carnes, MD  08/14/2018 12:23 PM    [address], [place], [place]  [address] Phone: [phone]; Fax: [phone]

## 2018-08-31 ENCOUNTER — Telehealth: Payer: Self-pay | Admitting: Family Medicine

## 2018-08-31 MED ORDER — POLYMYXIN B-TRIMETHOPRIM 10000-0.1 UNIT/ML-% OP SOLN: 1.0000 [drp] | OPHTHALMIC | 0 refills | Status: DC

## 2018-08-31 NOTE — Telephone Encounter (Signed)
Daughter aware and verbalizes understanding. 

## 2018-08-31 NOTE — Telephone Encounter (Signed)
Please let the patient know that I called in some eyedrops for her, come see [place] if it worsens

## 2018-09-10 ENCOUNTER — Encounter: Payer: Self-pay | Admitting: Family Medicine

## 2018-10-14 ENCOUNTER — Other Ambulatory Visit: Payer: Self-pay | Admitting: Internal Medicine

## 2018-10-14 MED ORDER — METOPROLOL SUCCINATE ER 50 MG PO TB24: 50.0000 mg | ORAL_TABLET | Freq: Every day | ORAL | 3 refills | Status: DC

## 2019-01-15 ENCOUNTER — Encounter: Payer: Self-pay | Admitting: Internal Medicine

## 2019-01-15 ENCOUNTER — Ambulatory Visit: Payer: Medicare Other | Admitting: Internal Medicine

## 2019-01-15 VITALS — BP 148/82 | HR 74 | Ht 61.0 in | Wt 141.8 lb

## 2019-01-15 DIAGNOSIS — E7800 Pure hypercholesterolemia, unspecified: Secondary | ICD-10-CM

## 2019-01-15 DIAGNOSIS — I10 Essential (primary) hypertension: Secondary | ICD-10-CM | POA: Diagnosis not present

## 2019-01-15 DIAGNOSIS — I2510 Atherosclerotic heart disease of native coronary artery without angina pectoris: Secondary | ICD-10-CM | POA: Diagnosis not present

## 2019-01-15 DIAGNOSIS — I255 Ischemic cardiomyopathy: Secondary | ICD-10-CM | POA: Diagnosis not present

## 2019-01-15 LAB — CBC
Hematocrit: 30.2 % — ABNORMAL LOW (ref 34.0–46.6)
Hemoglobin: 9.9 g/dL — ABNORMAL LOW (ref 11.1–15.9)
MCH: 27.3 pg (ref 26.6–33.0)
MCHC: 32.8 g/dL (ref 31.5–35.7)
MCV: 83 fL (ref 79–97)
Platelets: 335 10*3/uL (ref 150–450)
RBC: 3.63 x10E6/uL — AB (ref 3.77–5.28)
RDW: 14.8 % (ref 11.7–15.4)
WBC: 8.4 10*3/uL (ref 3.4–10.8)

## 2019-01-15 LAB — BASIC METABOLIC PANEL
BUN/Creatinine Ratio: 16 (ref 12–28)
BUN: 37 mg/dL — ABNORMAL HIGH (ref 8–27)
CO2: 23 mmol/L (ref 20–29)
Calcium: 9.2 mg/dL (ref 8.7–10.3)
Chloride: 101 mmol/L (ref 96–106)
Creatinine, Ser: 2.26 mg/dL — ABNORMAL HIGH (ref 0.57–1.00)
GFR calc Af Amer: 23 mL/min/{1.73_m2} — ABNORMAL LOW (ref >59)
GFR calc non Af Amer: 20 mL/min/{1.73_m2} — ABNORMAL LOW (ref >59)
Glucose: 116 mg/dL — ABNORMAL HIGH (ref 65–99)
Potassium: 4.2 mmol/L (ref 3.5–5.2)
Sodium: 142 mmol/L (ref 134–144)

## 2019-01-15 LAB — PRO B NATRIURETIC PEPTIDE: NT-Pro BNP: 2306 pg/mL — ABNORMAL HIGH (ref 0–738)

## 2019-01-15 MED ORDER — METOPROLOL SUCCINATE ER 50 MG PO TB24: 50.0000 mg | ORAL_TABLET | Freq: Every day | ORAL | 3 refills | Status: DC

## 2019-01-15 MED ORDER — ATORVASTATIN CALCIUM 80 MG PO TABS: 80.0000 mg | ORAL_TABLET | Freq: Every day | ORAL | 3 refills | Status: DC

## 2019-01-15 MED ORDER — HYDRALAZINE HCL 100 MG PO TABS: 100.0000 mg | ORAL_TABLET | Freq: Three times a day (TID) | ORAL | 3 refills | Status: DC

## 2019-01-15 MED ORDER — FUROSEMIDE 20 MG PO TABS: 40.0000 mg | ORAL_TABLET | Freq: Every day | ORAL | 3 refills | Status: DC

## 2019-01-15 MED ORDER — ISOSORBIDE MONONITRATE ER 60 MG PO TB24: ORAL_TABLET | ORAL | 3 refills | Status: DC

## 2019-01-15 NOTE — Progress Notes (Addendum)
Cardiology Office Note   Date:  01/15/2019   ID:  Meghan Anderson, DOB 1936-09-06, MRN [id number]  PCP:  Dettinger, Fransisca Kaufmann, MD  Cardiologist:   Dorris Carnes, MD   Pt presents for f/u of CAD and chronic systolic CHF     History of Present Illness Meghan Anderson is a 83 y.o. female with a history of CAD (mild to mod dz except for small D2 at 95%), Chronic systlolc CHF LVEF 30 to 35% (Was 50 to 55% in 2016), COPD., NSVT   I saw the pt in October 2019  Since seen she say she has done OK   Denies CP   Breathing is stable   Appetite is OK   No PND   No LE edema     Current Meds  Medication Sig  . albuterol (PROVENTIL) (2.5 MG/3ML) 0.083% nebulizer solution NEBULIZE 1 VIAL EVERY 4 HOURS AS NEEDED  . aspirin 81 MG chewable tablet Chew by mouth daily.  Marland Kitchen aspirin EC 81 MG tablet Take 81 mg by mouth daily.  Marland Kitchen atorvastatin (LIPITOR) 80 MG tablet Take 1 tablet (80 mg total) by mouth daily at 6 PM.  . Cyanocobalamin (B-12) 2500 MCG TABS Take by mouth.  . fluticasone furoate-vilanterol (BREO ELLIPTA) 100-25 MCG/INH AEPB Inhale 1 puff into the lungs daily.  . furosemide (LASIX) 20 MG tablet Take 2 tablets (40 mg total) by mouth daily.  . hydrALAZINE (APRESOLINE) 100 MG tablet Take 1 tablet (100 mg total) by mouth 3 (three) times daily.  . isosorbide mononitrate (IMDUR) 60 MG 24 hr tablet TAKE ONE (1) TABLET EACH DAY  . levothyroxine (SYNTHROID, LEVOTHROID) 112 MCG tablet Take 1 tablet (112 mcg total) by mouth daily.  Marland Kitchen MAGNESIUM OXIDE PO Take by mouth 2 (two) times daily.  . metoprolol succinate (TOPROL-XL) 50 MG 24 hr tablet Take 1 tablet (50 mg total) by mouth daily. Take with or immediately following a meal.  . omeprazole (PRILOSEC) 40 MG capsule TAKE ONE (1) CAPSULE EACH DAY  . potassium chloride SA (K-DUR,KLOR-CON) 20 MEQ tablet TAKE ONE (1) TABLET EACH DAY  . SPIRIVA HANDIHALER 18 MCG inhalation capsule USE 1 PUFF DAILY  . trimethoprim-polymyxin b (POLYTRIM) ophthalmic solution Place 1  drop into both eyes every 4 (four) hours. Give enough for 7 days     Allergies:   Prednisone; Moxifloxacin hcl in nacl; Levofloxacin; Moxifloxacin; and Quinolones   Past Medical History:  Diagnosis Date  . Arthritis   . Cataract   . Chronic bronchitis ([place])   . Chronic renal disease, stage 3, moderately decreased glomerular filtration rate (GFR) between 30-59 mL/min/1.73 square meter (HCC) 02/21/2016  . Complication of anesthesia   . COPD (chronic obstructive pulmonary disease) ([place])   . GERD (gastroesophageal reflux disease)   . Hyperlipidemia   . Hypertension   . Hypothyroidism   . Murmur, heart   . PONV (postoperative nausea and vomiting)   . Rectal adenocarcinoma ([place])   . Thyroid disease     Past Surgical History:  Procedure Laterality Date  . ABDOMINAL HYSTERECTOMY     complete  . CARDIAC CATHETERIZATION N/A 11/18/2016   Procedure: Left Heart Cath and Coronary Angiography;  Surgeon: [name], MD;  Location: [facility];  Service: Cardiovascular;  Laterality: N/A;  . CARDIAC CATHETERIZATION N/A 11/18/2016   Procedure: Right Heart Cath;  Surgeon: [name], MD;  Location: [place];  Service: Cardiovascular;  Laterality: N/A;  . COLONOSCOPY N/A 11/01/2015  RMR: Large fungating rectal mass precluded colonoscopy. Status post biopsy.   . COLONOSCOPY N/A 01/15/2016   Procedure: COLONOSCOPY;  Surgeon: [name], MD;  [facility];  Service: Endoscopy;  Laterality: N/A;  130   . FLEXIBLE SIGMOIDOSCOPY N/A 05/17/2016   Procedure: FLEXIBLE SIGMOIDOSCOPY;  Surgeon: [name], MD;  [facility];  Service: Endoscopy;  Laterality: N/A;  . Left ear    . TRANSANAL ENDOSCOPIC MICROSURGERY N/A 12/14/2015   Procedure: TRANSANAL ENDOSCOPIC MICROSURGERY OF RECTAL POLYP;  Surgeon: [name], MD;  [facility];  Service: General;  Laterality: N/A;     Social History:  The patient  reports that she quit smoking about 8 years  ago. Her smoking use included cigarettes. She has a 41.25 pack-year smoking history. She has never used smokeless tobacco. She reports that she does not drink alcohol or use drugs.   Family History:  The patient's family history includes Asthma in her brother; COPD in her brother; Cancer (age of onset: 7) in her brother; Diabetes in her sister; Emphysema in her brother; Heart disease in her sister; Kidney disease in her mother; Stroke in her sister.    ROS:  Please see the history of present illness. All other systems are reviewed and  Negative to the above problem except as noted.    PHYSICAL EXAM: VS:  BP (!) 148/82   Pulse 74   Ht 5\' 1"  (1.549 m)   Wt 141 lb 12.8 oz (64.3 kg)   BMI 26.79 kg/m   GEN: Overweight 83 yo in no acute distress   Examined in chair HEENT: normal  Neck: JVP is not elevated  No carotid bruits  Cardiac: RRR;  No murmurs, rubs, or gallops,No   edema  Respiratory: Decreased airflow   Miild wheezes   GI: soft, nontender, nondistended, + BS  No hepatomegaly  MS: no deformity Moving all extremities   Skin: warm and dry, no rash Neuro:  Strength and sensation are intact   EKG:  EKG is ordered today.  SU 74 bpm   Nonspecifc ST T wave changes     Lipid Panel    Component Value Date/Time   CHOL 168 07/08/2018 1628   TRIG 181 (H) 07/08/2018 1628   TRIG 230 (H) 12/29/2014 1155   HDL 40 07/08/2018 1628   HDL 49 12/29/2014 1155   CHOLHDL 4.2 07/08/2018 1628   CHOLHDL 4.3 07/05/2016 1051   VLDL 31 07/05/2016 1051   LDLCALC 92 07/08/2018 1628   LDLCALC 63 08/27/2013 1209      Wt Readings from Last 3 Encounters:  01/15/19 141 lb 12.8 oz (64.3 kg)  08/14/18 135 lb 6.4 oz (61.4 kg)  07/08/18 134 lb 12.8 oz (61.1 kg)      ASSESSMENT AND PLAN: 1  HTN   BP is OK     Keep on same meds  2  CAD  No symptoms of angina    Check CBC 3  CHF Volume is OK Keep on same meds    Check BMET and BNP  4  Hx NSVT  Pt denies palpitations   5  Pulmonary  Pt has f/u in  pulmonary clinic soon 6   HL  Continue statin       Current medicines are reviewed at length with the patient today.  The patient does not have concerns regarding medicines.  Signed, Dorris Carnes, MD  01/15/2019 10:19 AM    [facility]  [address], [place], [place]  [address] Phone: [phone]; Fax: [phone]

## 2019-01-15 NOTE — Patient Instructions (Addendum)
Keep on same medicines Will check labs today and call with results (bmet, bnp, cbc) Plan for follow up in clinic with Dr Harrington Challenger in 6 months

## 2019-01-26 ENCOUNTER — Other Ambulatory Visit: Payer: Self-pay | Admitting: Family Medicine

## 2019-01-26 DIAGNOSIS — J410 Simple chronic bronchitis: Secondary | ICD-10-CM

## 2019-01-28 ENCOUNTER — Other Ambulatory Visit: Payer: Self-pay

## 2019-02-27 ENCOUNTER — Other Ambulatory Visit: Payer: Self-pay | Admitting: Family Medicine

## 2019-02-27 DIAGNOSIS — J410 Simple chronic bronchitis: Secondary | ICD-10-CM

## 2019-03-18 ENCOUNTER — Telehealth: Payer: Self-pay | Admitting: Family Medicine

## 2019-03-31 ENCOUNTER — Other Ambulatory Visit: Payer: Self-pay | Admitting: Family Medicine

## 2019-03-31 DIAGNOSIS — J410 Simple chronic bronchitis: Secondary | ICD-10-CM

## 2019-04-12 ENCOUNTER — Encounter: Payer: Self-pay | Admitting: Family Medicine

## 2019-04-12 ENCOUNTER — Other Ambulatory Visit: Payer: Self-pay

## 2019-04-12 ENCOUNTER — Ambulatory Visit (INDEPENDENT_AMBULATORY_CARE_PROVIDER_SITE_OTHER): Payer: Medicare Other | Admitting: Family Medicine

## 2019-04-12 DIAGNOSIS — N183 Chronic kidney disease, stage 3 unspecified: Secondary | ICD-10-CM

## 2019-04-12 DIAGNOSIS — E039 Hypothyroidism, unspecified: Secondary | ICD-10-CM | POA: Diagnosis not present

## 2019-04-12 DIAGNOSIS — I10 Essential (primary) hypertension: Secondary | ICD-10-CM | POA: Diagnosis not present

## 2019-04-12 DIAGNOSIS — E7800 Pure hypercholesterolemia, unspecified: Secondary | ICD-10-CM

## 2019-04-12 DIAGNOSIS — J441 Chronic obstructive pulmonary disease with (acute) exacerbation: Secondary | ICD-10-CM

## 2019-04-12 NOTE — Progress Notes (Signed)
Virtual Visit via telephone Note  I connected with Meghan Anderson on 04/12/19 at 1534 by telephone and verified that I am speaking with the correct person using two identifiers. Meghan Anderson is currently located at home and no other people are currently with her during visit. The provider, Fransisca Kaufmann Jaelyn Bourgoin, MD is located in their office at time of visit.  Call ended at 1542  I discussed the limitations, risks, security and privacy concerns of performing an evaluation and management service by telephone and the availability of in person appointments. I also discussed with the patient that there may be a patient responsible charge related to this service. The patient expressed understanding and agreed to proceed.   History and Present Illness: Hypothyroidism recheck Patient is coming in for thyroid recheck today as well. They deny any issues with hair changes or heat or cold problems or diarrhea or constipation. They deny any chest pain or palpitations. They are currently on levothyroxine 177micrograms   Hyperlipidemia Patient is coming in for recheck of his hyperlipidemia. The patient is currently taking lipitor. They deny any issues with myalgias or history of liver damage from it. They deny any focal numbness or weakness or chest pain.   Hypertension Patient is currently on imdur and hydralazine and metoprolol, and their blood pressure today is unknown because she does not have batteries for cuff today. Patient denies any lightheadedness or dizziness. Patient denies headaches, blurred vision, chest pains, shortness of breath, or weakness. Denies any side effects from medication and is content with current medication. Patient has known ckd and anemia and is stable.   COPD Patient is coming in for COPD recheck today.  He is currently on spiriva and breo.  He has a mild chronic cough but denies any major coughing spells or wheezing spells.  He has 0nighttime symptoms per week and 2daytime  symptoms per week currently.   No diagnosis found.  Outpatient Encounter Medications as of 04/12/2019  Medication Sig   albuterol (PROVENTIL) (2.5 MG/3ML) 0.083% nebulizer solution NEBULIZE 1 VIAL EVERY 4 HOURS AS NEEDED   aspirin 81 MG chewable tablet Chew by mouth daily.   aspirin EC 81 MG tablet Take 81 mg by mouth daily.   atorvastatin (LIPITOR) 80 MG tablet Take 1 tablet (80 mg total) by mouth daily at 6 PM.   BREO ELLIPTA 100-25 MCG/INH AEPB USE 1 INHALATION DAILY   Cyanocobalamin (B-12) 2500 MCG TABS Take by mouth.   furosemide (LASIX) 20 MG tablet Take 2 tablets (40 mg total) by mouth daily.   hydrALAZINE (APRESOLINE) 100 MG tablet Take 1 tablet (100 mg total) by mouth 3 (three) times daily.   isosorbide mononitrate (IMDUR) 60 MG 24 hr tablet TAKE ONE (1) TABLET EACH DAY   levothyroxine (SYNTHROID, LEVOTHROID) 112 MCG tablet Take 1 tablet (112 mcg total) by mouth daily.   MAGNESIUM OXIDE PO Take by mouth 2 (two) times daily.   metoprolol succinate (TOPROL-XL) 50 MG 24 hr tablet Take 1 tablet (50 mg total) by mouth daily. Take with or immediately following a meal.   omeprazole (PRILOSEC) 40 MG capsule TAKE ONE (1) CAPSULE EACH DAY   potassium chloride SA (K-DUR,KLOR-CON) 20 MEQ tablet TAKE ONE (1) TABLET EACH DAY   SPIRIVA HANDIHALER 18 MCG inhalation capsule USE 1 PUFF DAILY   trimethoprim-polymyxin b (POLYTRIM) ophthalmic solution Place 1 drop into both eyes every 4 (four) hours. Give enough for 7 days   No facility-administered encounter medications on file as of  04/12/2019.     Review of Systems  Constitutional: Negative for chills and fever.  Eyes: Negative for visual disturbance.  Respiratory: Negative for chest tightness and shortness of breath.   Cardiovascular: Negative for chest pain and leg swelling.  Musculoskeletal: Negative for back pain and gait problem.  Skin: Negative for rash.  Neurological: Negative for light-headedness and headaches.    Psychiatric/Behavioral: Negative for agitation and behavioral problems.  All other systems reviewed and are negative.   Observations/Objective: Patient sounds comfortable and in no acute distress, her memory is not always the greatest so sometimes she forgets a few things but seems very lucid today.  Assessment and Plan: Problem List Items Addressed This Visit      Cardiovascular and Mediastinum   Essential hypertension, benign     Respiratory   COPD exacerbation (HCC)     Endocrine   Hypothyroidism - Primary     Genitourinary   Chronic renal disease, stage 3, moderately decreased glomerular filtration rate (GFR) between 30-59 mL/min/1.73 square meter (HCC)     Other   Hyperlipidemia       Follow Up Instructions:  Continue current medication, will do blood work the next time she comes for both anemia and CKD and thyroid.  Return in 3 months.   I discussed the assessment and treatment plan with the patient. The patient was provided an opportunity to ask questions and all were answered. The patient agreed with the plan and demonstrated an understanding of the instructions.   The patient was advised to call back or seek an in-person evaluation if the symptoms worsen or if the condition fails to improve as anticipated.  The above assessment and management plan was discussed with the patient. The patient verbalized understanding of and has agreed to the management plan. Patient is aware to call the clinic if symptoms persist or worsen. Patient is aware when to return to the clinic for a follow-up visit. Patient educated on when it is appropriate to go to the emergency department.    I provided 8 minutes of non-face-to-face time during this encounter.    [name], MD

## 2019-05-03 ENCOUNTER — Other Ambulatory Visit: Payer: Self-pay | Admitting: Family Medicine

## 2019-05-03 DIAGNOSIS — I10 Essential (primary) hypertension: Secondary | ICD-10-CM

## 2019-05-03 DIAGNOSIS — J410 Simple chronic bronchitis: Secondary | ICD-10-CM

## 2019-06-03 ENCOUNTER — Other Ambulatory Visit: Payer: Self-pay | Admitting: Family Medicine

## 2019-06-03 DIAGNOSIS — J410 Simple chronic bronchitis: Secondary | ICD-10-CM

## 2019-06-28 ENCOUNTER — Other Ambulatory Visit: Payer: Self-pay | Admitting: Family Medicine

## 2019-07-14 ENCOUNTER — Other Ambulatory Visit: Payer: Self-pay

## 2019-07-15 ENCOUNTER — Ambulatory Visit (INDEPENDENT_AMBULATORY_CARE_PROVIDER_SITE_OTHER): Payer: Medicare Other | Admitting: Family Medicine

## 2019-07-15 ENCOUNTER — Encounter: Payer: Self-pay | Admitting: Family Medicine

## 2019-07-15 VITALS — BP 174/80 | HR 70 | Temp 98.6°F | Ht 61.0 in | Wt 130.8 lb

## 2019-07-15 DIAGNOSIS — E782 Mixed hyperlipidemia: Secondary | ICD-10-CM

## 2019-07-15 DIAGNOSIS — E039 Hypothyroidism, unspecified: Secondary | ICD-10-CM

## 2019-07-15 DIAGNOSIS — I10 Essential (primary) hypertension: Secondary | ICD-10-CM

## 2019-07-15 MED ORDER — ISOSORBIDE MONONITRATE ER 30 MG PO TB24: 30.0000 mg | ORAL_TABLET | Freq: Every day | ORAL | 3 refills | Status: DC

## 2019-07-15 MED ORDER — LEVOTHYROXINE SODIUM 112 MCG PO TABS: 112.0000 ug | ORAL_TABLET | Freq: Every day | ORAL | 3 refills | Status: DC

## 2019-07-15 MED ORDER — OMEPRAZOLE 40 MG PO CPDR: DELAYED_RELEASE_CAPSULE | ORAL | 3 refills | Status: DC

## 2019-07-15 NOTE — Progress Notes (Signed)
BP (!) 174/80   Pulse 70   Temp 98.6 F (37 C) (Temporal)   Ht _0  (1.549 m)   Wt 130 lb 12.8 oz (59.3 kg)   BMI 24.71 kg/m    Subjective:   Patient ID: Meghan Anderson, female    DOB: 19-Aug-1936, 83 y.o.   MRN: [id number]  HPI: Meghan Anderson is a 83 y.o. female presenting on 07/15/2019 for Hypothyroidism (check up of chronic medical conditions)   HPI Hypertension Patient is currently on hydralazine 100 3 times daily and Imdur 60 daily and metoprolol 50 daily, and their blood pressure today is 174/80. Patient denies any lightheadedness or dizziness. Patient denies blurred vision, chest pains, shortness of breath, or weakness. Denies any side effects from medication and is content with current medication.  Patient does complain of headaches over the past couple days with her blood pressure being up, she says is also up with a pulmonologist.  Hypothyroidism recheck Patient is coming in for thyroid recheck today as well. They deny any issues with hair changes or heat or cold problems or diarrhea or constipation. They deny any chest pain or palpitations. They are currently on levothyroxine 146mcrograms   Relevant past medical, surgical, family and social history reviewed and updated as indicated. Interim medical history since our last visit reviewed. Allergies and medications reviewed and updated.  Review of Systems  Constitutional: Negative for chills and fever.  Eyes: Negative for visual disturbance.  Respiratory: Negative for chest tightness and shortness of breath.   Cardiovascular: Negative for chest pain and leg swelling.  Musculoskeletal: Negative for back pain and gait problem.  Skin: Negative for rash.  Neurological: Positive for headaches. Negative for dizziness, weakness and light-headedness.  Psychiatric/Behavioral: Negative for agitation and behavioral problems.  All other systems reviewed and are negative.   Per HPI unless specifically indicated above    Allergies as of 07/15/2019      Reactions   Prednisone Itching, Rash   Moxifloxacin Hcl In Nacl Rash   Levofloxacin Other (See Comments)   Pt states that this medication was too strong and she could not take them.     Moxifloxacin Rash   Quinolones Rash      Medication List       Accurate as of July 15, 2019  3:47 PM. If you have any questions, ask your nurse or doctor.        albuterol (2.5 MG/3ML) 0.083% nebulizer solution Commonly known as: PROVENTIL NEBULIZE 1 VIAL EVERY 4 HOURS AS NEEDED   aspirin 81 MG chewable tablet Chew by mouth daily.   aspirin EC 81 MG tablet Take 81 mg by mouth daily.   atorvastatin 80 MG tablet Commonly known as: LIPITOR Take 1 tablet (80 mg total) by mouth daily at 6 PM.   B-12 2500 MCG Tabs Take by mouth.   Breo Ellipta 100-25 MCG/INH Aepb Generic drug: fluticasone furoate-vilanterol USE 1 INHALATION DAILY   furosemide 20 MG tablet Commonly known as: LASIX Take 2 tablets (40 mg total) by mouth daily.   hydrALAZINE 100 MG tablet Commonly known as: APRESOLINE Take 1 tablet (100 mg total) by mouth 3 (three) times daily.   isosorbide mononitrate 60 MG 24 hr tablet Commonly known as: IMDUR TAKE ONE (1) TABLET EACH DAY   levothyroxine 112 MCG tablet Commonly known as: SYNTHROID TAKE ONE (1) TABLET EACH DAY   MAGNESIUM OXIDE PO Take by mouth 2 (two) times daily.   metoprolol succinate 50 MG 24 hr  tablet Commonly known as: TOPROL-XL Take 1 tablet (50 mg total) by mouth daily. Take with or immediately following a meal.   omeprazole 40 MG capsule Commonly known as: PRILOSEC TAKE ONE (1) CAPSULE EACH DAY   potassium chloride SA 20 MEQ tablet Commonly known as: K-DUR TAKE ONE (1) TABLET EACH DAY   Spiriva HandiHaler 18 MCG inhalation capsule Generic drug: tiotropium USE 1 PUFF DAILY        Objective:   BP (!) 174/80   Pulse 70   Temp 98.6 F (37 C) (Temporal)   Ht _0  (1.549 m)   Wt 130 lb 12.8 oz (59.3 kg)    BMI 24.71 kg/m   Wt Readings from Last 3 Encounters:  07/15/19 130 lb 12.8 oz (59.3 kg)  01/15/19 141 lb 12.8 oz (64.3 kg)  08/14/18 135 lb 6.4 oz (61.4 kg)    Physical Exam Vitals signs and nursing note reviewed.  Constitutional:      General: She is not in acute distress.    Appearance: She is well-developed. She is not diaphoretic.  Eyes:     Conjunctiva/sclera: Conjunctivae normal.  Cardiovascular:     Rate and Rhythm: Normal rate and regular rhythm.     Heart sounds: Normal heart sounds. No murmur.  Pulmonary:     Effort: Pulmonary effort is normal. No respiratory distress.     Breath sounds: Normal breath sounds. No wheezing.  Musculoskeletal: Normal range of motion.        General: No tenderness.  Skin:    General: Skin is warm and dry.     Findings: No rash.  Neurological:     Mental Status: She is alert and oriented to person, place, and time.     Coordination: Coordination normal.  Psychiatric:        Behavior: Behavior normal.       Assessment & Plan:   Problem List Items Addressed This Visit      Cardiovascular and Mediastinum   Essential hypertension, benign   Relevant Medications   isosorbide mononitrate (IMDUR) 30 MG 24 hr tablet   Other Relevant Orders   CMP14+EGFR     Endocrine   Hypothyroidism - Primary   Relevant Medications   omeprazole (PRILOSEC) 40 MG capsule   levothyroxine (SYNTHROID) 112 MCG tablet   Other Relevant Orders   TSH     Other   Hyperlipidemia   Relevant Medications   isosorbide mononitrate (IMDUR) 30 MG 24 hr tablet   Other Relevant Orders   CBC with Differential/Platelet      Will increase the Imdur by 30 mg by taking the 30 with a 60  Check thyroid today Follow up plan: Return in about 6 months (around 01/12/2020), or if symptoms worsen or fail to improve, for Hypertension and hypothyroid follow-up.  Counseling provided for all of the vaccine components No orders of the defined types were placed in this  encounter.   Caryl Pina, MD [facility] 07/15/2019, 3:47 PM

## 2019-07-16 LAB — CBC WITH DIFFERENTIAL/PLATELET
Basophils Absolute: 0 10*3/uL (ref 0.0–0.2)
Basos: 0 %
EOS (ABSOLUTE): 0.2 10*3/uL (ref 0.0–0.4)
Eos: 2 %
Hematocrit: 32.3 % — ABNORMAL LOW (ref 34.0–46.6)
Hemoglobin: 9.9 g/dL — ABNORMAL LOW (ref 11.1–15.9)
Immature Grans (Abs): 0.1 10*3/uL (ref 0.0–0.1)
Immature Granulocytes: 1 %
Lymphocytes Absolute: 0.5 10*3/uL — ABNORMAL LOW (ref 0.7–3.1)
Lymphs: 5 %
MCH: 27.2 pg (ref 26.6–33.0)
MCHC: 30.7 g/dL — ABNORMAL LOW (ref 31.5–35.7)
MCV: 89 fL (ref 79–97)
Monocytes Absolute: 0.8 10*3/uL (ref 0.1–0.9)
Monocytes: 8 %
Neutrophils Absolute: 8.7 10*3/uL — ABNORMAL HIGH (ref 1.4–7.0)
Neutrophils: 84 %
Platelets: 359 10*3/uL (ref 150–450)
RBC: 3.64 x10E6/uL — ABNORMAL LOW (ref 3.77–5.28)
RDW: 17.1 % — ABNORMAL HIGH (ref 11.7–15.4)
WBC: 10.2 10*3/uL (ref 3.4–10.8)

## 2019-07-16 LAB — CMP14+EGFR
ALT: 6 IU/L (ref 0–32)
AST: 12 IU/L (ref 0–40)
Albumin/Globulin Ratio: 1.2 (ref 1.2–2.2)
Albumin: 3.7 g/dL (ref 3.6–4.6)
Alkaline Phosphatase: 90 IU/L (ref 39–117)
BUN/Creatinine Ratio: 17 (ref 12–28)
BUN: 35 mg/dL — ABNORMAL HIGH (ref 8–27)
Bilirubin Total: <0.2 mg/dL (ref 0.0–1.2)
CO2: 17 mmol/L — ABNORMAL LOW (ref 20–29)
Calcium: 8.5 mg/dL — ABNORMAL LOW (ref 8.7–10.3)
Chloride: 108 mmol/L — ABNORMAL HIGH (ref 96–106)
Creatinine, Ser: 2.08 mg/dL — ABNORMAL HIGH (ref 0.57–1.00)
GFR calc Af Amer: 25 mL/min/{1.73_m2} — ABNORMAL LOW (ref >59)
GFR calc non Af Amer: 22 mL/min/{1.73_m2} — ABNORMAL LOW (ref >59)
Globulin, Total: 3 g/dL (ref 1.5–4.5)
Glucose: 100 mg/dL — ABNORMAL HIGH (ref 65–99)
Potassium: 5.4 mmol/L — ABNORMAL HIGH (ref 3.5–5.2)
Sodium: 138 mmol/L (ref 134–144)
Total Protein: 6.7 g/dL (ref 6.0–8.5)

## 2019-07-16 LAB — TSH: TSH: 0.207 u[IU]/mL — ABNORMAL LOW (ref 0.450–4.500)

## 2019-08-04 ENCOUNTER — Other Ambulatory Visit: Payer: Self-pay | Admitting: Family Medicine

## 2019-08-04 DIAGNOSIS — J410 Simple chronic bronchitis: Secondary | ICD-10-CM

## 2019-09-15 ENCOUNTER — Other Ambulatory Visit: Payer: Self-pay | Admitting: Family Medicine

## 2019-09-20 ENCOUNTER — Ambulatory Visit: Payer: Medicare Other | Admitting: Internal Medicine

## 2019-09-23 ENCOUNTER — Telehealth: Payer: Self-pay | Admitting: Internal Medicine

## 2019-09-23 NOTE — Telephone Encounter (Signed)
Patient's son, Marlou Sa, states that he is his mothers caregiver and needs to come with his mom to her appt tomorrow 09/24/19 at 11:00am with Dr. Harrington Challenger. States that she cannot be on her own.

## 2019-09-23 NOTE — Progress Notes (Signed)
Cardiology Office Note   Date:  09/24/2019   ID:  Meghan Anderson, DOB 1936-02-12, MRN [id number]  PCP:  Dettinger, Fransisca Kaufmann, MD  Cardiologist:   Dorris Carnes, MD   Pt presents for f/u of CAD and chronic systolic CHF     History of Present Illness Meghan Anderson is a 83 y.o. female with a history of CAD (mild to mod dz except for small D2 at [id number]), systolilic  CHF LVEF 30 to [id number]  Echo in September 2018 LVEF improved to 55 to 60% , COPD and NSVT    I saw her in clinic in march 2020  Since seen she has done okay.  She denies chest pressure.  Breathing is stable, okay.  She does not sleep well but that is not because of her breathing.  Appetite is okay.  Has some palpitations but no dizziness they are short-lived.    Current Meds  Medication Sig  . albuterol (PROVENTIL) (2.5 MG/3ML) 0.083% nebulizer solution NEBULIZE 1 VIAL EVERY 4 HOURS AS NEEDED  . aspirin 81 MG chewable tablet Chew by mouth daily.  Marland Kitchen aspirin EC 81 MG tablet Take 81 mg by mouth daily.  Marland Kitchen atorvastatin (LIPITOR) 80 MG tablet Take 1 tablet (80 mg total) by mouth daily at 6 PM.  . BREO ELLIPTA 100-25 MCG/INH AEPB USE 1 INHALATION DAILY  . Cyanocobalamin (B-12) 2500 MCG TABS Take by mouth.  . furosemide (LASIX) 20 MG tablet Take 2 tablets (40 mg total) by mouth daily.  . hydrALAZINE (APRESOLINE) 100 MG tablet Take 1 tablet (100 mg total) by mouth 3 (three) times daily.  . isosorbide mononitrate (IMDUR) 30 MG 24 hr tablet Take 1 tablet (30 mg total) by mouth daily. Take a 30 mg tablet with a 60 mg tablet for a total of 90  . isosorbide mononitrate (IMDUR) 60 MG 24 hr tablet TAKE ONE (1) TABLET EACH DAY  . MAGNESIUM OXIDE PO Take by mouth 2 (two) times daily.  . metoprolol succinate (TOPROL-XL) 50 MG 24 hr tablet Take 1 tablet (50 mg total) by mouth daily. Take with or immediately following a meal.  . omeprazole (PRILOSEC) 40 MG capsule TAKE ONE (1) CAPSULE EACH DAY  . potassium chloride SA (K-DUR) 20 MEQ tablet TAKE ONE  (1) TABLET EACH DAY  . SPIRIVA HANDIHALER 18 MCG inhalation capsule USE 1 PUFF DAILY  . [DISCONTINUED] levothyroxine (SYNTHROID) 112 MCG tablet Take 1 tablet (112 mcg total) by mouth daily before breakfast.     Allergies:   Prednisone, Moxifloxacin hcl in nacl, Levofloxacin, Moxifloxacin, and Quinolones   Past Medical History:  Diagnosis Date  . Arthritis   . Cataract   . Chronic bronchitis ([place])   . Chronic renal disease, stage 3, moderately decreased glomerular filtration rate (GFR) between 30-59 mL/min/1.73 square meter 02/21/2016  . Complication of anesthesia   . COPD (chronic obstructive pulmonary disease) ([place])   . GERD (gastroesophageal reflux disease)   . Hyperlipidemia   . Hypertension   . Hypothyroidism   . Murmur, heart   . PONV (postoperative nausea and vomiting)   . Rectal adenocarcinoma ([place])   . Thyroid disease     Past Surgical History:  Procedure Laterality Date  . ABDOMINAL HYSTERECTOMY     complete  . CARDIAC CATHETERIZATION N/A 11/18/2016   Procedure: Left Heart Cath and Coronary Angiography;  Surgeon: [name], MD;  Location: [facility];  Service: Cardiovascular;  Laterality: N/A;  . CARDIAC CATHETERIZATION N/A 11/18/2016  Procedure: Right Heart Cath;  Surgeon: [name], MD;  Location: [facility];  Service: Cardiovascular;  Laterality: N/A;  . COLONOSCOPY N/A 11/01/2015   RMR: Large fungating rectal mass precluded colonoscopy. Status post biopsy.   . COLONOSCOPY N/A 01/15/2016   Procedure: COLONOSCOPY;  Surgeon: [name], MD;  Location: [facility];  Service: Endoscopy;  Laterality: N/A;  130   . FLEXIBLE SIGMOIDOSCOPY N/A 05/17/2016   Procedure: FLEXIBLE SIGMOIDOSCOPY;  Surgeon: [name], MD;  Location: [facility];  Service: Endoscopy;  Laterality: N/A;  . Left ear    . TRANSANAL ENDOSCOPIC MICROSURGERY N/A 12/14/2015   Procedure: TRANSANAL ENDOSCOPIC MICROSURGERY OF RECTAL POLYP;  Surgeon: [name], MD;   Location: [facility];  Service: General;  Laterality: N/A;     Social History:  The patient  reports that she quit smoking about 8 years ago. Her smoking use included cigarettes. She has a 41.25 pack-year smoking history. She has never used smokeless tobacco. She reports that she does not drink alcohol or use drugs.   Family History:  The patient's family history includes Asthma in her brother; COPD in her brother; Cancer (age of onset: 11) in her brother; Diabetes in her sister; Emphysema in her brother; Heart disease in her sister; Kidney disease in her mother; Stroke in her sister.    ROS:  Please see the history of present illness. All other systems are reviewed and  Negative to the above problem except as noted.    PHYSICAL EXAM: VS:  BP (!) 138/52   Pulse 75   Ht 5\' 1"  (1.549 m)   Wt 129 lb (58.5 kg)   SpO2 97%   BMI 24.37 kg/m   GEN: Patient in no acute distress   examined in chair HEENT: normal  Neck: JVP is normal  Cardiac: RRR with occasional skipped;  No murmurs, rubs, or gallops,No   edema  Respiratory: Rhonchi bilaterally, improved some with cough.  No wheezes.  No rales. GI: soft, nontender, nondistended, + BS  No hepatomegaly  MS: no deformity Moving all extremities   Skin: warm and dry, no rash Neuro:  Strength and sensation are intact   EKG:  EKG is not ordered today   Lipid Panel    Component Value Date/Time   CHOL 168 07/08/2018 1628   TRIG 181 (H) 07/08/2018 1628   TRIG 230 (H) 12/29/2014 1155   HDL 40 07/08/2018 1628   HDL 49 12/29/2014 1155   CHOLHDL 4.2 07/08/2018 1628   CHOLHDL 4.3 07/05/2016 1051   VLDL 31 07/05/2016 1051   LDLCALC 92 07/08/2018 1628   LDLCALC 63 08/27/2013 1209      Wt Readings from Last 3 Encounters:  09/24/19 129 lb (58.5 kg)  07/15/19 130 lb 12.8 oz (59.3 kg)  01/15/19 141 lb 12.8 oz (64.3 kg)      ASSESSMENT AND PLAN: 1  HTN   BP is OK     Keep on same meds  2  CAD  No symptoms of angina    Check CBC 3  CHF LVEF  has normalized on echo in 2018      Check BMET and BNP  4  Hx NSVT  Pt has rare palpitations.  Shortlived   Would follow   6   HL  Continue statin       Current medicines are reviewed at length with the patient today.  The patient does not have concerns regarding medicines.  Signed, Dorris Carnes, MD  09/24/2019 11:37 AM    [address], [place], [place]  [address] Phone: [phone]; Fax: [phone]

## 2019-09-24 ENCOUNTER — Ambulatory Visit: Payer: Medicare Other | Admitting: Internal Medicine

## 2019-09-24 ENCOUNTER — Other Ambulatory Visit: Payer: Self-pay

## 2019-09-24 ENCOUNTER — Encounter: Payer: Self-pay | Admitting: Internal Medicine

## 2019-09-24 VITALS — BP 138/52 | HR 75 | Ht 61.0 in | Wt 129.0 lb

## 2019-09-24 DIAGNOSIS — E7800 Pure hypercholesterolemia, unspecified: Secondary | ICD-10-CM | POA: Diagnosis not present

## 2019-09-24 DIAGNOSIS — I2510 Atherosclerotic heart disease of native coronary artery without angina pectoris: Secondary | ICD-10-CM | POA: Diagnosis not present

## 2019-09-24 DIAGNOSIS — I10 Essential (primary) hypertension: Secondary | ICD-10-CM | POA: Diagnosis not present

## 2019-09-24 LAB — BASIC METABOLIC PANEL
BUN/Creatinine Ratio: 17 (ref 12–28)
BUN: 35 mg/dL — ABNORMAL HIGH (ref 8–27)
CO2: 17 mmol/L — ABNORMAL LOW (ref 20–29)
Calcium: 8.8 mg/dL (ref 8.7–10.3)
Chloride: 108 mmol/L — ABNORMAL HIGH (ref 96–106)
Creatinine, Ser: 2.09 mg/dL — ABNORMAL HIGH (ref 0.57–1.00)
GFR calc Af Amer: 25 mL/min/{1.73_m2} — ABNORMAL LOW (ref >59)
GFR calc non Af Amer: 22 mL/min/{1.73_m2} — ABNORMAL LOW (ref >59)
Glucose: 112 mg/dL — ABNORMAL HIGH (ref 65–99)
Potassium: 4.7 mmol/L (ref 3.5–5.2)
Sodium: 139 mmol/L (ref 134–144)

## 2019-09-24 LAB — LIPID PANEL
Chol/HDL Ratio: 3.5 ratio (ref 0.0–4.4)
Cholesterol, Total: 169 mg/dL (ref 100–199)
HDL: 48 mg/dL (ref >39)
LDL Chol Calc (NIH): 82 mg/dL (ref 0–99)
Triglycerides: 238 mg/dL — ABNORMAL HIGH (ref 0–149)
VLDL Cholesterol Cal: 39 mg/dL (ref 5–40)

## 2019-09-24 LAB — PRO B NATRIURETIC PEPTIDE: NT-Pro BNP: 5261 pg/mL — ABNORMAL HIGH (ref 0–738)

## 2019-09-24 MED ORDER — LEVOTHYROXINE SODIUM 100 MCG PO TABS: 100.0000 ug | ORAL_TABLET | Freq: Every day | ORAL | 3 refills | Status: AC

## 2019-09-24 NOTE — Patient Instructions (Signed)
Medication Instructions:  Your physician has recommended you make the following change in your medication:  1.) decrease levothyroxine (Synthroid) to 100 mcg once daily  *If you need a refill on your cardiac medications before your next appointment, please call your pharmacy*  Lab Work: Bmet, bnp, lipids today If you have labs (blood work) drawn today and your tests are completely normal, you will receive your results only by: Marland Kitchen MyChart Message (if you have MyChart) OR . A paper copy in the mail If you have any lab test that is abnormal or we need to change your treatment, we will call you to review the results.  Testing/Procedures: none  Follow-Up: At [facility], you and your health needs are our priority.  As part of our continuing mission to provide you with exceptional heart care, we have created designated Provider Care Teams.  These Care Teams include your primary Cardiologist (physician) and Advanced Practice Providers (APPs -  Physician Assistants and Nurse Practitioners) who all work together to provide you with the care you need, when you need it.  Your next appointment:   6 months  The format for your next appointment:   In Person  Provider:   Dorris Carnes, MD  Other Instructions

## 2019-10-13 ENCOUNTER — Other Ambulatory Visit: Payer: Self-pay | Admitting: Family Medicine

## 2019-10-13 DIAGNOSIS — J410 Simple chronic bronchitis: Secondary | ICD-10-CM

## 2019-11-10 ENCOUNTER — Other Ambulatory Visit: Payer: Self-pay | Admitting: Family Medicine

## 2019-11-10 DIAGNOSIS — J410 Simple chronic bronchitis: Secondary | ICD-10-CM

## 2019-11-21 DIAGNOSIS — J9610 Chronic respiratory failure, unspecified whether with hypoxia or hypercapnia: Secondary | ICD-10-CM | POA: Diagnosis not present

## 2019-11-21 DIAGNOSIS — J9601 Acute respiratory failure with hypoxia: Secondary | ICD-10-CM | POA: Diagnosis not present

## 2019-11-21 DIAGNOSIS — J449 Chronic obstructive pulmonary disease, unspecified: Secondary | ICD-10-CM | POA: Diagnosis not present

## 2019-11-21 DIAGNOSIS — J441 Chronic obstructive pulmonary disease with (acute) exacerbation: Secondary | ICD-10-CM | POA: Diagnosis not present

## 2019-11-29 ENCOUNTER — Other Ambulatory Visit: Payer: Self-pay | Admitting: Internal Medicine

## 2019-11-29 DIAGNOSIS — I10 Essential (primary) hypertension: Secondary | ICD-10-CM

## 2019-12-08 ENCOUNTER — Ambulatory Visit: Payer: Medicare Other | Admitting: Family Medicine

## 2019-12-13 ENCOUNTER — Other Ambulatory Visit: Payer: Self-pay | Admitting: Family Medicine

## 2019-12-13 DIAGNOSIS — J410 Simple chronic bronchitis: Secondary | ICD-10-CM

## 2019-12-22 DIAGNOSIS — J441 Chronic obstructive pulmonary disease with (acute) exacerbation: Secondary | ICD-10-CM | POA: Diagnosis not present

## 2019-12-22 DIAGNOSIS — J449 Chronic obstructive pulmonary disease, unspecified: Secondary | ICD-10-CM | POA: Diagnosis not present

## 2019-12-22 DIAGNOSIS — J9601 Acute respiratory failure with hypoxia: Secondary | ICD-10-CM | POA: Diagnosis not present

## 2019-12-22 DIAGNOSIS — J9610 Chronic respiratory failure, unspecified whether with hypoxia or hypercapnia: Secondary | ICD-10-CM | POA: Diagnosis not present

## 2019-12-27 ENCOUNTER — Other Ambulatory Visit: Payer: Self-pay | Admitting: Internal Medicine

## 2019-12-27 DIAGNOSIS — I10 Essential (primary) hypertension: Secondary | ICD-10-CM

## 2019-12-28 MED ORDER — ISOSORBIDE MONONITRATE ER 30 MG PO TB24: 30.0000 mg | ORAL_TABLET | Freq: Every day | ORAL | 2 refills | Status: AC

## 2020-01-10 DIAGNOSIS — R0600 Dyspnea, unspecified: Secondary | ICD-10-CM | POA: Diagnosis not present

## 2020-01-10 DIAGNOSIS — J449 Chronic obstructive pulmonary disease, unspecified: Secondary | ICD-10-CM | POA: Diagnosis not present

## 2020-01-19 DIAGNOSIS — J441 Chronic obstructive pulmonary disease with (acute) exacerbation: Secondary | ICD-10-CM | POA: Diagnosis not present

## 2020-01-19 DIAGNOSIS — J449 Chronic obstructive pulmonary disease, unspecified: Secondary | ICD-10-CM | POA: Diagnosis not present

## 2020-01-19 DIAGNOSIS — J9610 Chronic respiratory failure, unspecified whether with hypoxia or hypercapnia: Secondary | ICD-10-CM | POA: Diagnosis not present

## 2020-01-19 DIAGNOSIS — J9601 Acute respiratory failure with hypoxia: Secondary | ICD-10-CM | POA: Diagnosis not present

## 2020-01-24 ENCOUNTER — Other Ambulatory Visit: Payer: Self-pay | Admitting: Internal Medicine

## 2020-01-24 ENCOUNTER — Ambulatory Visit: Payer: Medicare PPO | Admitting: Family Medicine

## 2020-01-24 ENCOUNTER — Other Ambulatory Visit: Payer: Self-pay

## 2020-01-24 ENCOUNTER — Encounter: Payer: Self-pay | Admitting: Family Medicine

## 2020-01-24 VITALS — BP 137/70 | HR 69 | Temp 98.4°F | Ht 61.0 in | Wt 131.0 lb

## 2020-01-24 DIAGNOSIS — M5416 Radiculopathy, lumbar region: Secondary | ICD-10-CM | POA: Diagnosis not present

## 2020-01-24 DIAGNOSIS — E782 Mixed hyperlipidemia: Secondary | ICD-10-CM | POA: Diagnosis not present

## 2020-01-24 DIAGNOSIS — I10 Essential (primary) hypertension: Secondary | ICD-10-CM

## 2020-01-24 DIAGNOSIS — J410 Simple chronic bronchitis: Secondary | ICD-10-CM | POA: Diagnosis not present

## 2020-01-24 DIAGNOSIS — E039 Hypothyroidism, unspecified: Secondary | ICD-10-CM | POA: Diagnosis not present

## 2020-01-24 DIAGNOSIS — E7800 Pure hypercholesterolemia, unspecified: Secondary | ICD-10-CM

## 2020-01-24 MED ORDER — GABAPENTIN 100 MG PO CAPS: 100.0000 mg | ORAL_CAPSULE | Freq: Two times a day (BID) | ORAL | 3 refills | Status: DC

## 2020-01-24 MED ORDER — SPIRIVA HANDIHALER 18 MCG IN CAPS: 18.0000 ug | ORAL_CAPSULE | Freq: Every day | RESPIRATORY_TRACT | 3 refills | Status: AC

## 2020-01-24 MED ORDER — BREO ELLIPTA 100-25 MCG/INH IN AEPB: 1.0000 | INHALATION_SPRAY | Freq: Every day | RESPIRATORY_TRACT | 3 refills | Status: AC

## 2020-01-24 NOTE — Progress Notes (Signed)
BP 137/70   Pulse 69   Temp 98.4 F (36.9 C)   Ht '5\' 1"'  (1.549 m)   Wt 131 lb (59.4 kg)   BMI 24.75 kg/m    Subjective:   Patient ID: Meghan Anderson, female    DOB: 1935-12-11, 84 y.o.   MRN: [id number]  HPI: Meghan Anderson is a 84 y.o. female presenting on 01/24/2020 for Medical Management of Chronic Issues, Hypothyroidism, Leg Pain (Left>Right  Feet are numb and tingling), and Insomnia   HPI Hypothyroidism recheck Patient is coming in for thyroid recheck today as well. They deny any issues with hair changes or heat or cold problems or diarrhea or constipation. They deny any chest pain or palpitations. They are currently on levothyroxine 100 micrograms   Left low back pain with radiculopathy down left leg all the way to the foot This is been something the patient has been dealing with for some time and she says it is worsening recently and is keeping her up at night she is not sleeping as well and that is where she is having more issues.  She has tried some Tylenol extra strength and it does not seem to be helping.  She says the numbness and tingling goes all the way down to her foot especially worse at night.  She has been diagnosed with back problems and nerve compression in the past but due to her cancer they were at a point where they could not do anything about it.  Relevant past medical, surgical, family and social history reviewed and updated as indicated. Interim medical history since our last visit reviewed. Allergies and medications reviewed and updated.  Review of Systems  Constitutional: Negative for chills and fever.  Eyes: Negative for visual disturbance.  Respiratory: Negative for chest tightness and shortness of breath.   Cardiovascular: Negative for chest pain and leg swelling.  Musculoskeletal: Positive for back pain. Negative for gait problem.  Skin: Negative for rash.  Neurological: Positive for numbness. Negative for weakness, light-headedness and headaches.   Psychiatric/Behavioral: Negative for agitation and behavioral problems.  All other systems reviewed and are negative.   Per HPI unless specifically indicated above   Allergies as of 01/24/2020      Reactions   Prednisone Itching, Rash   Moxifloxacin Hcl In Nacl Rash   Levofloxacin Other (See Comments)   Pt states that this medication was too strong and she could not take them.     Moxifloxacin Rash   Quinolones Rash      Medication List       Accurate as of January 24, 2020  4:00 PM. If you have any questions, ask your nurse or doctor.        albuterol (2.5 MG/3ML) 0.083% nebulizer solution Commonly known as: PROVENTIL NEBULIZE 1 VIAL EVERY 4 HOURS AS NEEDED   aspirin 81 MG chewable tablet Chew by mouth daily.   aspirin EC 81 MG tablet Take 81 mg by mouth daily.   atorvastatin 80 MG tablet Commonly known as: LIPITOR Take 1 tablet (80 mg total) by mouth daily at 6 PM.   B-12 2500 MCG Tabs Take by mouth.   Breo Ellipta 100-25 MCG/INH Aepb Generic drug: fluticasone furoate-vilanterol Inhale 1 puff into the lungs daily. What changed: See the new instructions. Changed by: Fransisca Kaufmann Eon Zunker, MD   furosemide 20 MG tablet Commonly known as: LASIX Take 2 tablets (40 mg total) by mouth daily.   gabapentin 100 MG capsule Commonly known as:  NEURONTIN Take 1 capsule (100 mg total) by mouth 2 (two) times daily. Started by: Fransisca Kaufmann Gionni Vaca, MD   hydrALAZINE 100 MG tablet Commonly known as: APRESOLINE TAKE ONE (1) TABLET THREE (3) TIMES EACH DAY   isosorbide mononitrate 60 MG 24 hr tablet Commonly known as: IMDUR TAKE ONE (1) TABLET EACH DAY. Take a 60 mg tablet along with a 30 mg tablet for a total of 90 mg.   isosorbide mononitrate 30 MG 24 hr tablet Commonly known as: IMDUR Take 1 tablet (30 mg total) by mouth daily. Take a 30 mg tablet with a 60 mg tablet for a total of 90   levothyroxine 100 MCG tablet Commonly known as: Synthroid Take 1 tablet (100 mcg  total) by mouth daily before breakfast.   MAGNESIUM OXIDE PO Take by mouth 2 (two) times daily.   metoprolol succinate 50 MG 24 hr tablet Commonly known as: TOPROL-XL Take 1 tablet (50 mg total) by mouth daily. Take with or immediately following a meal.   omeprazole 40 MG capsule Commonly known as: PRILOSEC TAKE ONE (1) CAPSULE EACH DAY   potassium chloride SA 20 MEQ tablet Commonly known as: KLOR-CON TAKE ONE (1) TABLET EACH DAY   Spiriva HandiHaler 18 MCG inhalation capsule Generic drug: tiotropium Place 1 capsule (18 mcg total) into inhaler and inhale daily. What changed: See the new instructions. Changed by: Fransisca Kaufmann Tyrease Vandeberg, MD        Objective:   BP 137/70   Pulse 69   Temp 98.4 F (36.9 C)   Ht '5\' 1"'  (1.549 m)   Wt 131 lb (59.4 kg)   BMI 24.75 kg/m   Wt Readings from Last 3 Encounters:  01/24/20 131 lb (59.4 kg)  09/24/19 129 lb (58.5 kg)  07/15/19 130 lb 12.8 oz (59.3 kg)    Physical Exam Vitals and nursing note reviewed.  Constitutional:      General: She is not in acute distress.    Appearance: She is well-developed. She is not diaphoretic.  Eyes:     Conjunctiva/sclera: Conjunctivae normal.  Cardiovascular:     Rate and Rhythm: Normal rate and regular rhythm.     Heart sounds: Normal heart sounds. No murmur.  Pulmonary:     Effort: Pulmonary effort is normal. No respiratory distress.     Breath sounds: Normal breath sounds. No wheezing.  Musculoskeletal:        General: No tenderness. Normal range of motion.  Skin:    General: Skin is warm and dry.     Findings: No rash.  Neurological:     Mental Status: She is alert and oriented to person, place, and time.     Coordination: Coordination normal.  Psychiatric:        Behavior: Behavior normal.       Assessment & Plan:   Problem List Items Addressed This Visit      Cardiovascular and Mediastinum   Essential hypertension, benign   Relevant Orders   CMP14+EGFR     Endocrine    Hypothyroidism - Primary   Relevant Orders   TSH     Other   Hyperlipidemia   Relevant Orders   Lipid panel    Other Visit Diagnoses    Lumbar back pain with radiculopathy affecting left lower extremity       Relevant Medications   gabapentin (NEURONTIN) 100 MG capsule   Simple chronic bronchitis (HCC)       Relevant Medications   fluticasone furoate-vilanterol (BREO  ELLIPTA) 100-25 MCG/INH AEPB   tiotropium (SPIRIVA HANDIHALER) 18 MCG inhalation capsule   Other Relevant Orders   CBC with Differential/Platelet      Patient comes in for low back pain with radicular symptoms and recheck, will start gabapentin for that.  Continue other medications.  We will check blood work. Follow up plan: Return in about 6 months (around 07/26/2020), or if symptoms worsen or fail to improve, for Thyroid and cholesterol recheck.  Counseling provided for all of the vaccine components Orders Placed This Encounter  Procedures  . CBC with Differential/Platelet  . CMP14+EGFR  . Lipid panel  . TSH    Caryl Pina, MD [facility] 01/24/2020, 4:00 PM

## 2020-01-25 LAB — LIPID PANEL
Chol/HDL Ratio: 4 ratio (ref 0.0–4.4)
Cholesterol, Total: 170 mg/dL (ref 100–199)
HDL: 42 mg/dL (ref >39)
LDL Chol Calc (NIH): 88 mg/dL (ref 0–99)
Triglycerides: 237 mg/dL — ABNORMAL HIGH (ref 0–149)
VLDL Cholesterol Cal: 40 mg/dL (ref 5–40)

## 2020-01-25 LAB — CBC WITH DIFFERENTIAL/PLATELET
Basophils Absolute: 0 10*3/uL (ref 0.0–0.2)
Basos: 0 %
EOS (ABSOLUTE): 0.1 10*3/uL (ref 0.0–0.4)
Eos: 1 %
Hematocrit: 30.4 % — ABNORMAL LOW (ref 34.0–46.6)
Hemoglobin: 10.2 g/dL — ABNORMAL LOW (ref 11.1–15.9)
Immature Grans (Abs): 0 10*3/uL (ref 0.0–0.1)
Immature Granulocytes: 0 %
Lymphocytes Absolute: 0.4 10*3/uL — ABNORMAL LOW (ref 0.7–3.1)
Lymphs: 5 %
MCH: 31.2 pg (ref 26.6–33.0)
MCHC: 33.6 g/dL (ref 31.5–35.7)
MCV: 93 fL (ref 79–97)
Monocytes Absolute: 0.8 10*3/uL (ref 0.1–0.9)
Monocytes: 9 %
Neutrophils Absolute: 7.2 10*3/uL — ABNORMAL HIGH (ref 1.4–7.0)
Neutrophils: 85 %
Platelets: 369 10*3/uL (ref 150–450)
RBC: 3.27 x10E6/uL — ABNORMAL LOW (ref 3.77–5.28)
RDW: 14.3 % (ref 11.7–15.4)
WBC: 8.6 10*3/uL (ref 3.4–10.8)

## 2020-01-25 LAB — CMP14+EGFR
ALT: 7 IU/L (ref 0–32)
AST: 14 IU/L (ref 0–40)
Albumin/Globulin Ratio: 1.4 (ref 1.2–2.2)
Albumin: 3.5 g/dL — ABNORMAL LOW (ref 3.6–4.6)
Alkaline Phosphatase: 78 IU/L (ref 39–117)
BUN/Creatinine Ratio: 15 (ref 12–28)
BUN: 34 mg/dL — ABNORMAL HIGH (ref 8–27)
Bilirubin Total: <0.2 mg/dL (ref 0.0–1.2)
CO2: 18 mmol/L — ABNORMAL LOW (ref 20–29)
Calcium: 8.4 mg/dL — ABNORMAL LOW (ref 8.7–10.3)
Chloride: 109 mmol/L — ABNORMAL HIGH (ref 96–106)
Creatinine, Ser: 2.31 mg/dL — ABNORMAL HIGH (ref 0.57–1.00)
GFR calc Af Amer: 22 mL/min/{1.73_m2} — ABNORMAL LOW (ref >59)
GFR calc non Af Amer: 19 mL/min/{1.73_m2} — ABNORMAL LOW (ref >59)
Globulin, Total: 2.5 g/dL (ref 1.5–4.5)
Glucose: 102 mg/dL — ABNORMAL HIGH (ref 65–99)
Potassium: 5 mmol/L (ref 3.5–5.2)
Sodium: 142 mmol/L (ref 134–144)
Total Protein: 6 g/dL (ref 6.0–8.5)

## 2020-01-25 LAB — TSH: TSH: 1.76 u[IU]/mL (ref 0.450–4.500)

## 2020-02-09 ENCOUNTER — Telehealth: Payer: Self-pay | Admitting: Family Medicine

## 2020-02-09 DIAGNOSIS — M5416 Radiculopathy, lumbar region: Secondary | ICD-10-CM

## 2020-02-09 MED ORDER — GABAPENTIN 100 MG PO CAPS: 100.0000 mg | ORAL_CAPSULE | Freq: Two times a day (BID) | ORAL | 3 refills | Status: AC

## 2020-02-09 NOTE — Telephone Encounter (Signed)
Patient aware and verbalized understanding. °

## 2020-02-09 NOTE — Telephone Encounter (Signed)
Gabapentin seems to be helping some. She takes in the morning and in the evening. States it wears off before its time for the next dose wants to increase dosage. Daughter states she was told to call in and let Dettinger now how she was doing. Please advise.

## 2020-02-09 NOTE — Telephone Encounter (Signed)
Left message to see why she needed the medication increase.

## 2020-02-09 NOTE — Telephone Encounter (Signed)
Sent in new prescription that she can take 1-2 twice a day as needed.  This should give her more Caryl Pina, MD [facility] 02/09/2020, 12:42 PM

## 2020-02-19 DIAGNOSIS — J9601 Acute respiratory failure with hypoxia: Secondary | ICD-10-CM | POA: Diagnosis not present

## 2020-02-19 DIAGNOSIS — J441 Chronic obstructive pulmonary disease with (acute) exacerbation: Secondary | ICD-10-CM | POA: Diagnosis not present

## 2020-02-19 DIAGNOSIS — J9610 Chronic respiratory failure, unspecified whether with hypoxia or hypercapnia: Secondary | ICD-10-CM | POA: Diagnosis not present

## 2020-02-19 DIAGNOSIS — J449 Chronic obstructive pulmonary disease, unspecified: Secondary | ICD-10-CM | POA: Diagnosis not present

## 2020-03-20 DIAGNOSIS — J449 Chronic obstructive pulmonary disease, unspecified: Secondary | ICD-10-CM | POA: Diagnosis not present

## 2020-03-20 DIAGNOSIS — J9610 Chronic respiratory failure, unspecified whether with hypoxia or hypercapnia: Secondary | ICD-10-CM | POA: Diagnosis not present

## 2020-03-20 DIAGNOSIS — J9601 Acute respiratory failure with hypoxia: Secondary | ICD-10-CM | POA: Diagnosis not present

## 2020-03-20 DIAGNOSIS — J441 Chronic obstructive pulmonary disease with (acute) exacerbation: Secondary | ICD-10-CM | POA: Diagnosis not present

## 2020-04-17 ENCOUNTER — Other Ambulatory Visit: Payer: Self-pay | Admitting: Family Medicine

## 2020-04-17 DIAGNOSIS — E039 Hypothyroidism, unspecified: Secondary | ICD-10-CM

## 2020-04-20 DIAGNOSIS — J9610 Chronic respiratory failure, unspecified whether with hypoxia or hypercapnia: Secondary | ICD-10-CM | POA: Diagnosis not present

## 2020-04-20 DIAGNOSIS — J9601 Acute respiratory failure with hypoxia: Secondary | ICD-10-CM | POA: Diagnosis not present

## 2020-04-20 DIAGNOSIS — J441 Chronic obstructive pulmonary disease with (acute) exacerbation: Secondary | ICD-10-CM | POA: Diagnosis not present

## 2020-04-20 DIAGNOSIS — J449 Chronic obstructive pulmonary disease, unspecified: Secondary | ICD-10-CM | POA: Diagnosis not present

## 2020-05-20 DIAGNOSIS — J441 Chronic obstructive pulmonary disease with (acute) exacerbation: Secondary | ICD-10-CM | POA: Diagnosis not present

## 2020-05-20 DIAGNOSIS — J9610 Chronic respiratory failure, unspecified whether with hypoxia or hypercapnia: Secondary | ICD-10-CM | POA: Diagnosis not present

## 2020-05-20 DIAGNOSIS — J9601 Acute respiratory failure with hypoxia: Secondary | ICD-10-CM | POA: Diagnosis not present

## 2020-05-20 DIAGNOSIS — J449 Chronic obstructive pulmonary disease, unspecified: Secondary | ICD-10-CM | POA: Diagnosis not present

## 2020-05-24 ENCOUNTER — Other Ambulatory Visit: Payer: Self-pay | Admitting: Family Medicine

## 2020-05-24 DIAGNOSIS — I10 Essential (primary) hypertension: Secondary | ICD-10-CM

## 2020-06-12 ENCOUNTER — Other Ambulatory Visit: Payer: Self-pay | Admitting: Internal Medicine

## 2020-06-14 NOTE — Telephone Encounter (Signed)
Pt's pharmacy is requesting a refill on levothyroxine. Would Dr. Ross like to refill this medication? Please address °

## 2020-06-14 NOTE — Telephone Encounter (Signed)
It looks like patient's PCP Dr. Warrick Parisian is following her TSH.  He probably will take care of refills for her.

## 2020-06-20 DIAGNOSIS — J441 Chronic obstructive pulmonary disease with (acute) exacerbation: Secondary | ICD-10-CM | POA: Diagnosis not present

## 2020-06-20 DIAGNOSIS — J9610 Chronic respiratory failure, unspecified whether with hypoxia or hypercapnia: Secondary | ICD-10-CM | POA: Diagnosis not present

## 2020-06-20 DIAGNOSIS — J9601 Acute respiratory failure with hypoxia: Secondary | ICD-10-CM | POA: Diagnosis not present

## 2020-06-20 DIAGNOSIS — J449 Chronic obstructive pulmonary disease, unspecified: Secondary | ICD-10-CM | POA: Diagnosis not present

## 2020-06-21 DIAGNOSIS — R0602 Shortness of breath: Secondary | ICD-10-CM | POA: Diagnosis not present

## 2020-06-21 DIAGNOSIS — J449 Chronic obstructive pulmonary disease, unspecified: Secondary | ICD-10-CM | POA: Diagnosis not present

## 2020-06-21 DIAGNOSIS — R05 Cough: Secondary | ICD-10-CM | POA: Diagnosis not present

## 2020-06-21 DIAGNOSIS — J441 Chronic obstructive pulmonary disease with (acute) exacerbation: Secondary | ICD-10-CM | POA: Diagnosis not present

## 2020-06-21 DIAGNOSIS — R062 Wheezing: Secondary | ICD-10-CM | POA: Diagnosis not present

## 2020-06-25 ENCOUNTER — Inpatient Hospital Stay
Admission: AD | Admit: 2020-06-25 | Payer: Medicare PPO | Source: Other Acute Inpatient Hospital | Admitting: Internal Medicine

## 2020-06-25 DIAGNOSIS — J90 Pleural effusion, not elsewhere classified: Secondary | ICD-10-CM | POA: Diagnosis not present

## 2020-06-25 DIAGNOSIS — R404 Transient alteration of awareness: Secondary | ICD-10-CM | POA: Diagnosis not present

## 2020-06-25 DIAGNOSIS — I340 Nonrheumatic mitral (valve) insufficiency: Secondary | ICD-10-CM | POA: Diagnosis not present

## 2020-06-25 DIAGNOSIS — N183 Chronic kidney disease, stage 3 unspecified: Secondary | ICD-10-CM | POA: Diagnosis not present

## 2020-06-25 DIAGNOSIS — J441 Chronic obstructive pulmonary disease with (acute) exacerbation: Secondary | ICD-10-CM | POA: Diagnosis not present

## 2020-06-25 DIAGNOSIS — J9811 Atelectasis: Secondary | ICD-10-CM | POA: Diagnosis not present

## 2020-06-25 DIAGNOSIS — E785 Hyperlipidemia, unspecified: Secondary | ICD-10-CM | POA: Diagnosis not present

## 2020-06-25 DIAGNOSIS — J449 Chronic obstructive pulmonary disease, unspecified: Secondary | ICD-10-CM | POA: Diagnosis not present

## 2020-06-25 DIAGNOSIS — N179 Acute kidney failure, unspecified: Secondary | ICD-10-CM | POA: Diagnosis not present

## 2020-06-25 DIAGNOSIS — R7989 Other specified abnormal findings of blood chemistry: Secondary | ICD-10-CM | POA: Diagnosis not present

## 2020-06-25 DIAGNOSIS — I5022 Chronic systolic (congestive) heart failure: Secondary | ICD-10-CM | POA: Diagnosis not present

## 2020-06-25 DIAGNOSIS — R0689 Other abnormalities of breathing: Secondary | ICD-10-CM | POA: Diagnosis not present

## 2020-06-25 DIAGNOSIS — E875 Hyperkalemia: Secondary | ICD-10-CM | POA: Diagnosis not present

## 2020-06-25 DIAGNOSIS — Z743 Need for continuous supervision: Secondary | ICD-10-CM | POA: Diagnosis not present

## 2020-06-25 DIAGNOSIS — N281 Cyst of kidney, acquired: Secondary | ICD-10-CM | POA: Diagnosis not present

## 2020-06-25 DIAGNOSIS — N189 Chronic kidney disease, unspecified: Secondary | ICD-10-CM | POA: Diagnosis not present

## 2020-06-25 DIAGNOSIS — Z7982 Long term (current) use of aspirin: Secondary | ICD-10-CM | POA: Diagnosis not present

## 2020-06-25 DIAGNOSIS — I480 Paroxysmal atrial fibrillation: Secondary | ICD-10-CM | POA: Diagnosis not present

## 2020-06-25 DIAGNOSIS — R402411 Glasgow coma scale score 13-15, in the field [EMT or ambulance]: Secondary | ICD-10-CM | POA: Diagnosis not present

## 2020-06-25 DIAGNOSIS — Z20822 Contact with and (suspected) exposure to covid-19: Secondary | ICD-10-CM | POA: Diagnosis not present

## 2020-06-25 DIAGNOSIS — I509 Heart failure, unspecified: Secondary | ICD-10-CM | POA: Diagnosis not present

## 2020-06-25 DIAGNOSIS — I110 Hypertensive heart disease with heart failure: Secondary | ICD-10-CM | POA: Diagnosis not present

## 2020-06-25 DIAGNOSIS — I517 Cardiomegaly: Secondary | ICD-10-CM | POA: Diagnosis not present

## 2020-06-25 DIAGNOSIS — K5730 Diverticulosis of large intestine without perforation or abscess without bleeding: Secondary | ICD-10-CM | POA: Diagnosis not present

## 2020-06-25 DIAGNOSIS — Z87891 Personal history of nicotine dependence: Secondary | ICD-10-CM | POA: Diagnosis not present

## 2020-06-25 DIAGNOSIS — I700 Atherosclerosis of aorta: Secondary | ICD-10-CM | POA: Diagnosis not present

## 2020-06-25 DIAGNOSIS — M1612 Unilateral primary osteoarthritis, left hip: Secondary | ICD-10-CM | POA: Diagnosis not present

## 2020-06-25 DIAGNOSIS — R0602 Shortness of breath: Secondary | ICD-10-CM | POA: Diagnosis not present

## 2020-06-25 DIAGNOSIS — Z9981 Dependence on supplemental oxygen: Secondary | ICD-10-CM | POA: Diagnosis not present

## 2020-06-25 DIAGNOSIS — J9622 Acute and chronic respiratory failure with hypercapnia: Secondary | ICD-10-CM | POA: Diagnosis not present

## 2020-06-25 DIAGNOSIS — I2510 Atherosclerotic heart disease of native coronary artery without angina pectoris: Secondary | ICD-10-CM | POA: Diagnosis not present

## 2020-06-25 DIAGNOSIS — E039 Hypothyroidism, unspecified: Secondary | ICD-10-CM | POA: Diagnosis not present

## 2020-06-25 DIAGNOSIS — I10 Essential (primary) hypertension: Secondary | ICD-10-CM | POA: Diagnosis not present

## 2020-06-25 DIAGNOSIS — R52 Pain, unspecified: Secondary | ICD-10-CM | POA: Diagnosis not present

## 2020-06-26 ENCOUNTER — Ambulatory Visit: Payer: Medicare PPO | Admitting: Internal Medicine

## 2020-06-27 ENCOUNTER — Telehealth: Payer: Self-pay | Admitting: Internal Medicine

## 2020-06-27 NOTE — Telephone Encounter (Signed)
Rollene Fare the patient's daughter in law is calling stating Meghan Anderson is currently hospitalized at [facility]. This is the reason behind her missing her recent appt. She states she is currently on a wait list to be transferred, but there is not a Cardiologist there treating her. Rollene Fare is wanting to know if there is anything Dr. Harrington Challenger can do to get her transferred to [facility] faster. Please advise.

## 2020-06-27 NOTE — Telephone Encounter (Signed)
Spoke with the patient's daughter in-law who states that the patient was sent to [facility] on Sunday afternoon for trouble breathing. She states that the patient is still in the ER and they are not putting her in a room because she is on the list to be transferred. She is wondering if there is anything that Dr. Harrington Challenger can do to speed up the process. I advised her that all of the hospitals in the area are very full so it may take some time and that Dr. Harrington Challenger was not able to assist in an earlier transfer. She verbalized understanding.

## 2020-06-29 ENCOUNTER — Telehealth: Payer: Self-pay | Admitting: *Deleted

## 2020-06-29 NOTE — Telephone Encounter (Signed)
Received a message from Dr. Jackalyn Lombard nurse (DOD) that patient needs a follow up arranged in the next week or so.  This was after a DOD phone call per nurse.  Message to scheduling to arrange.

## 2020-06-30 ENCOUNTER — Telehealth: Payer: Self-pay | Admitting: *Deleted

## 2020-06-30 ENCOUNTER — Telehealth: Payer: Self-pay | Admitting: Family Medicine

## 2020-06-30 ENCOUNTER — Telehealth: Payer: Self-pay | Admitting: Internal Medicine

## 2020-06-30 DIAGNOSIS — N179 Acute kidney failure, unspecified: Secondary | ICD-10-CM

## 2020-06-30 NOTE — Telephone Encounter (Signed)
TRANSITIONAL CARE MANAGEMENT TELEPHONE OUTREACH NOTE   Contact Date: 06/30/2020 Contacted By: Meghan Million, LPN   DISCHARGE INFORMATION Date of Discharge:06/29/20 Discharge Facility: [facility] Principal Discharge [facility]  Outpatient Follow Up Recommendations (copied from discharge summary) Discharge date: 06/29/2020   Discharge to: Home with home health  Discharge Service: General Medicine (MED   Meghan Anderson is a female primary care patient of Dettinger, Fransisca Kaufmann, MD. An outgoing telephone call was made today and I spoke with Meghan Anderson, daughter.  Ms. Trias condition(s) and treatment(s) were discussed. An opportunity to ask questions was provided and all were answered or forwarded as appropriate.    ACTIVITIES OF DAILY LIVING  Meghan Anderson lives alone and she cannot perform ADLs independently. her primary caregiver is Meghan Anderson. she is able to depend on her primary caregiver(s) for consistent help. Transportation to appointments, to pick up medications, and to run errands is not a problem.  (Consider referral to [facility] if transportation or a consistent caregiver is a problem)   Fall Risk Fall Risk  01/24/2020 07/15/2019  Falls in the past year? 0 0  Number falls in past yr: - -  Injury with Fall? - -    high [place] Modifications/Assistive Devices Wheelchair: No Cane: Yes Ramp: No Bedside Toilet: No Hospital Bed:  Yes Other:    [place] she is receiving home health Physical therapy and occupational therapy services.     MEDICATION RECONCILIATION  Ms. Matsushita has been able to pick-up all prescribed discharge medications from the pharmacy.   A post discharge medication reconciliation was performed and the complete medication list was reviewed with the patient/caregiver and is current as of 06/30/2020. Changes highlighted below.  Discontinued MedicationsDischarge Medications:   Your Medication List   STOP taking  these medications  azithromycin 500 MG tablet Commonly known as: ZITHROMAX   START taking these medications  apixaban 2.5 mg Tab Commonly known as: ELIQUIS Take 1 tablet (2.5 mg total) by mouth Two (2) times a day.  calcium-vitamin D 500 mg(1,250mg ) -200 unit per tablet Take 1 tablet by mouth 2 (two) times a day with meals.   CHANGE how you take these medications  albuterol 2.5 mg /3 mL (0.083 %) nebulizer solution 2.5 mg every six (6) hours as needed. What changed: Another medication with the same name was added. Make sure you understand how and when to take each.  albuterol 2.5 mg /3 mL (0.083 %) nebulizer solution Inhale 3 mL (2.5 mg total) by nebulization every six (6) hours as needed for wheezing for up to 30 doses. What changed: You were already taking a medication with the same name, and this prescription was added. Make sure you understand how and when to take each.  Current Medication List Allergies as of 06/30/2020      Reactions   Prednisone Itching, Rash   Moxifloxacin Hcl In Nacl Rash   Levofloxacin Other (See Comments)   Pt states that this medication was too strong and she could not take them.     Moxifloxacin Rash   Quinolones Rash      Medication List       Accurate as of June 30, 2020  4:28 PM. If you have any questions, ask your nurse or doctor.        albuterol (2.5 MG/3ML) 0.083% nebulizer solution Commonly known as: PROVENTIL NEBULIZE 1 VIAL EVERY 4 HOURS AS NEEDED   aspirin 81 MG chewable  tablet Chew by mouth daily.   aspirin EC 81 MG tablet Take 81 mg by mouth daily.   atorvastatin 80 MG tablet Commonly known as: LIPITOR TAKE 1 TABLET DAILY AT 6PM   B-12 2500 MCG Tabs Take by mouth.   Breo Ellipta 100-25 MCG/INH Aepb Generic drug: fluticasone furoate-vilanterol Inhale 1 puff into the lungs daily.   furosemide 20 MG tablet Commonly known as: LASIX TAKE TWO TABLETS BY MOUTH DAILY   gabapentin 100 MG capsule Commonly known as:  NEURONTIN Take 1-2 capsules (100-200 mg total) by mouth 2 (two) times daily.   hydrALAZINE 100 MG tablet Commonly known as: APRESOLINE TAKE ONE (1) TABLET THREE (3) TIMES EACH DAY   isosorbide mononitrate 60 MG 24 hr tablet Commonly known as: IMDUR TAKE ONE (1) TABLET EACH DAY. Take a 60 mg tablet along with a 30 mg tablet for a total of 90 mg.   isosorbide mononitrate 30 MG 24 hr tablet Commonly known as: IMDUR Take 1 tablet (30 mg total) by mouth daily. Take a 30 mg tablet with a 60 mg tablet for a total of 90   levothyroxine 100 MCG tablet Commonly known as: Synthroid Take 1 tablet (100 mcg total) by mouth daily before breakfast.   MAGNESIUM OXIDE PO Take by mouth 2 (two) times daily.   metoprolol succinate 50 MG 24 hr tablet Commonly known as: TOPROL-XL Take 1 tablet (50 mg total) by mouth daily. with food. Please keep upcoming appt in August with Dr. Harrington Challenger. Thank you   omeprazole 40 MG capsule Commonly known as: PRILOSEC TAKE ONE (1) CAPSULE EACH DAY   potassium chloride SA 20 MEQ tablet Commonly known as: KLOR-CON TAKE ONE (1) TABLET EACH DAY   Spiriva HandiHaler 18 MCG inhalation capsule Generic drug: tiotropium Place 1 capsule (18 mcg total) into inhaler and inhale daily.        PATIENT EDUCATION & FOLLOW-UP PLAN  An appointment for Transitional Care Management is scheduled with Dettinger, Fransisca Kaufmann, MD on 07/13/20 at 1:10  Take all medications as prescribed  Contact our office by calling [phone] if you have any questions or concerns

## 2020-06-30 NOTE — Telephone Encounter (Signed)
Does pt ntbs or can we do the referral based off the Hospital notes at [facility]?

## 2020-06-30 NOTE — Telephone Encounter (Signed)
  REFERRAL REQUEST Telephone Note 06/30/2020  What type of referral do you need? Pt was in hospital at [facility] for a couple of days. They want pt to get referral to nephrologist.   Why do you need this referral? Hospital wants her to go  Have you been seen at our office for this problem?daughter in law is not sure (Advise that they may need an appointment with their PCP before a referral can be done)  Is there a particular doctor or location that you prefer? [facility]/  Patient notified that referrals can take up to a week or longer to process. If they haven't heard anything within a week they should call back and speak with the referral department.

## 2020-06-30 NOTE — Telephone Encounter (Signed)
Patient has been contacted and has been scheduled with Dr. Harrington Challenger on 07/14/20,

## 2020-06-30 NOTE — Telephone Encounter (Signed)
Patient's daughter in law is calling to schedule a hosp f/u. Dr. Harrington Challenger is booked up until October and she feels like her mother needs to be seen sooner. She does not want her to see on of the APP's, she would rather her see Dr. Harrington Challenger or another doctor. Please advise.

## 2020-07-02 NOTE — Telephone Encounter (Signed)
Placed referral for the patient. 

## 2020-07-04 DIAGNOSIS — J150 Pneumonia due to Klebsiella pneumoniae: Secondary | ICD-10-CM | POA: Diagnosis not present

## 2020-07-04 DIAGNOSIS — J441 Chronic obstructive pulmonary disease with (acute) exacerbation: Secondary | ICD-10-CM | POA: Diagnosis not present

## 2020-07-04 DIAGNOSIS — E875 Hyperkalemia: Secondary | ICD-10-CM | POA: Diagnosis not present

## 2020-07-04 DIAGNOSIS — N179 Acute kidney failure, unspecified: Secondary | ICD-10-CM | POA: Diagnosis not present

## 2020-07-04 DIAGNOSIS — A419 Sepsis, unspecified organism: Secondary | ICD-10-CM | POA: Diagnosis not present

## 2020-07-04 DIAGNOSIS — K828 Other specified diseases of gallbladder: Secondary | ICD-10-CM | POA: Diagnosis not present

## 2020-07-04 DIAGNOSIS — Z20822 Contact with and (suspected) exposure to covid-19: Secondary | ICD-10-CM | POA: Diagnosis not present

## 2020-07-04 DIAGNOSIS — Z4682 Encounter for fitting and adjustment of non-vascular catheter: Secondary | ICD-10-CM | POA: Diagnosis not present

## 2020-07-04 DIAGNOSIS — I5022 Chronic systolic (congestive) heart failure: Secondary | ICD-10-CM | POA: Diagnosis not present

## 2020-07-04 DIAGNOSIS — J9811 Atelectasis: Secondary | ICD-10-CM | POA: Diagnosis not present

## 2020-07-04 DIAGNOSIS — J439 Emphysema, unspecified: Secondary | ICD-10-CM | POA: Diagnosis not present

## 2020-07-04 DIAGNOSIS — J9602 Acute respiratory failure with hypercapnia: Secondary | ICD-10-CM | POA: Diagnosis not present

## 2020-07-04 DIAGNOSIS — J440 Chronic obstructive pulmonary disease with acute lower respiratory infection: Secondary | ICD-10-CM | POA: Diagnosis not present

## 2020-07-04 DIAGNOSIS — N19 Unspecified kidney failure: Secondary | ICD-10-CM | POA: Diagnosis not present

## 2020-07-04 DIAGNOSIS — Z4901 Encounter for fitting and adjustment of extracorporeal dialysis catheter: Secondary | ICD-10-CM | POA: Diagnosis not present

## 2020-07-04 DIAGNOSIS — I083 Combined rheumatic disorders of mitral, aortic and tricuspid valves: Secondary | ICD-10-CM | POA: Diagnosis not present

## 2020-07-04 DIAGNOSIS — J9601 Acute respiratory failure with hypoxia: Secondary | ICD-10-CM | POA: Diagnosis not present

## 2020-07-04 DIAGNOSIS — R918 Other nonspecific abnormal finding of lung field: Secondary | ICD-10-CM | POA: Diagnosis not present

## 2020-07-04 DIAGNOSIS — J811 Chronic pulmonary edema: Secondary | ICD-10-CM | POA: Diagnosis not present

## 2020-07-04 DIAGNOSIS — D65 Disseminated intravascular coagulation [defibrination syndrome]: Secondary | ICD-10-CM | POA: Diagnosis not present

## 2020-07-04 DIAGNOSIS — I2510 Atherosclerotic heart disease of native coronary artery without angina pectoris: Secondary | ICD-10-CM | POA: Diagnosis not present

## 2020-07-04 DIAGNOSIS — I82622 Acute embolism and thrombosis of deep veins of left upper extremity: Secondary | ICD-10-CM | POA: Diagnosis not present

## 2020-07-04 DIAGNOSIS — R0602 Shortness of breath: Secondary | ICD-10-CM | POA: Diagnosis not present

## 2020-07-04 DIAGNOSIS — K760 Fatty (change of) liver, not elsewhere classified: Secondary | ICD-10-CM | POA: Diagnosis not present

## 2020-07-04 DIAGNOSIS — R34 Anuria and oliguria: Secondary | ICD-10-CM | POA: Diagnosis not present

## 2020-07-04 DIAGNOSIS — I4891 Unspecified atrial fibrillation: Secondary | ICD-10-CM | POA: Diagnosis not present

## 2020-07-04 DIAGNOSIS — K7200 Acute and subacute hepatic failure without coma: Secondary | ICD-10-CM | POA: Diagnosis not present

## 2020-07-04 DIAGNOSIS — A4181 Sepsis due to Enterococcus: Secondary | ICD-10-CM | POA: Diagnosis not present

## 2020-07-04 DIAGNOSIS — J9690 Respiratory failure, unspecified, unspecified whether with hypoxia or hypercapnia: Secondary | ICD-10-CM | POA: Diagnosis not present

## 2020-07-04 DIAGNOSIS — R6521 Severe sepsis with septic shock: Secondary | ICD-10-CM | POA: Diagnosis not present

## 2020-07-04 DIAGNOSIS — I10 Essential (primary) hypertension: Secondary | ICD-10-CM | POA: Diagnosis not present

## 2020-07-04 DIAGNOSIS — J90 Pleural effusion, not elsewhere classified: Secondary | ICD-10-CM | POA: Diagnosis not present

## 2020-07-04 DIAGNOSIS — J449 Chronic obstructive pulmonary disease, unspecified: Secondary | ICD-10-CM | POA: Diagnosis not present

## 2020-07-04 DIAGNOSIS — I5033 Acute on chronic diastolic (congestive) heart failure: Secondary | ICD-10-CM | POA: Diagnosis not present

## 2020-07-04 DIAGNOSIS — R0989 Other specified symptoms and signs involving the circulatory and respiratory systems: Secondary | ICD-10-CM | POA: Diagnosis not present

## 2020-07-04 DIAGNOSIS — K5730 Diverticulosis of large intestine without perforation or abscess without bleeding: Secondary | ICD-10-CM | POA: Diagnosis not present

## 2020-07-04 DIAGNOSIS — R778 Other specified abnormalities of plasma proteins: Secondary | ICD-10-CM | POA: Diagnosis not present

## 2020-07-13 ENCOUNTER — Ambulatory Visit: Payer: Medicare PPO | Admitting: Family Medicine

## 2020-07-14 ENCOUNTER — Ambulatory Visit: Payer: Medicare PPO | Admitting: Internal Medicine

## 2020-07-21 DIAGNOSIS — J449 Chronic obstructive pulmonary disease, unspecified: Secondary | ICD-10-CM | POA: Diagnosis not present

## 2020-07-21 DIAGNOSIS — J441 Chronic obstructive pulmonary disease with (acute) exacerbation: Secondary | ICD-10-CM | POA: Diagnosis not present

## 2020-07-21 DIAGNOSIS — J9601 Acute respiratory failure with hypoxia: Secondary | ICD-10-CM | POA: Diagnosis not present

## 2020-07-21 DIAGNOSIS — J9610 Chronic respiratory failure, unspecified whether with hypoxia or hypercapnia: Secondary | ICD-10-CM | POA: Diagnosis not present

## 2020-07-26 ENCOUNTER — Ambulatory Visit: Payer: Medicare PPO | Admitting: Family Medicine

## 2020-08-11 DEATH — deceased
